# Patient Record
Sex: Male | Born: 1960
Health system: Southern US, Community
[De-identification: ages and names within clinical notes are randomized; demographics above are authoritative.]

## PROBLEM LIST (undated history)

## (undated) ENCOUNTER — Emergency Department (HOSPITAL_COMMUNITY): Admission: EM | Payer: Self-pay

## (undated) DIAGNOSIS — F1910 Other psychoactive substance abuse, uncomplicated: Secondary | ICD-10-CM

## (undated) DIAGNOSIS — M1990 Unspecified osteoarthritis, unspecified site: Secondary | ICD-10-CM

## (undated) DIAGNOSIS — I10 Essential (primary) hypertension: Secondary | ICD-10-CM

## (undated) DIAGNOSIS — E119 Type 2 diabetes mellitus without complications: Secondary | ICD-10-CM

## (undated) DIAGNOSIS — Z9289 Personal history of other medical treatment: Secondary | ICD-10-CM

## (undated) DIAGNOSIS — N289 Disorder of kidney and ureter, unspecified: Secondary | ICD-10-CM

## (undated) DIAGNOSIS — S86019A Strain of unspecified Achilles tendon, initial encounter: Secondary | ICD-10-CM

## (undated) DIAGNOSIS — K219 Gastro-esophageal reflux disease without esophagitis: Secondary | ICD-10-CM

## (undated) DIAGNOSIS — N183 Chronic kidney disease, stage 3 unspecified: Secondary | ICD-10-CM

## (undated) HISTORY — DX: Essential (primary) hypertension: I10

## (undated) HISTORY — PX: ORBITAL FRACTURE SURGERY: SHX725

## (undated) HISTORY — DX: Other psychoactive substance abuse, uncomplicated: F19.10

## (undated) SURGERY — Surgical Case
Anesthesia: *Unknown

---

## 1999-08-07 ENCOUNTER — Encounter: Payer: Self-pay | Admitting: Emergency Medicine

## 1999-08-07 ENCOUNTER — Emergency Department (HOSPITAL_COMMUNITY): Admission: EM | Admit: 1999-08-07 | Discharge: 1999-08-08 | Payer: Self-pay | Admitting: Emergency Medicine

## 1999-11-28 ENCOUNTER — Emergency Department (HOSPITAL_COMMUNITY): Admission: EM | Admit: 1999-11-28 | Discharge: 1999-11-28 | Payer: Self-pay

## 2000-04-25 ENCOUNTER — Encounter: Payer: Self-pay | Admitting: Emergency Medicine

## 2000-04-25 ENCOUNTER — Emergency Department (HOSPITAL_COMMUNITY): Admission: EM | Admit: 2000-04-25 | Discharge: 2000-04-25 | Payer: Self-pay | Admitting: Emergency Medicine

## 2000-12-11 ENCOUNTER — Emergency Department (HOSPITAL_COMMUNITY): Admission: EM | Admit: 2000-12-11 | Discharge: 2000-12-12 | Payer: Self-pay | Admitting: Emergency Medicine

## 2000-12-12 ENCOUNTER — Encounter: Payer: Self-pay | Admitting: Emergency Medicine

## 2000-12-13 ENCOUNTER — Inpatient Hospital Stay (HOSPITAL_COMMUNITY): Admission: EM | Admit: 2000-12-13 | Discharge: 2000-12-14 | Payer: Self-pay | Admitting: Emergency Medicine

## 2000-12-13 ENCOUNTER — Encounter: Payer: Self-pay | Admitting: Otolaryngology

## 2000-12-14 ENCOUNTER — Inpatient Hospital Stay (HOSPITAL_COMMUNITY): Admission: AD | Admit: 2000-12-14 | Discharge: 2000-12-15 | Payer: Self-pay | Admitting: Oral Surgery

## 2001-09-13 ENCOUNTER — Emergency Department (HOSPITAL_COMMUNITY): Admission: EM | Admit: 2001-09-13 | Discharge: 2001-09-13 | Payer: Self-pay | Admitting: *Deleted

## 2001-12-20 ENCOUNTER — Emergency Department (HOSPITAL_COMMUNITY): Admission: EM | Admit: 2001-12-20 | Discharge: 2001-12-21 | Payer: Self-pay | Admitting: Emergency Medicine

## 2002-04-23 ENCOUNTER — Emergency Department (HOSPITAL_COMMUNITY): Admission: EM | Admit: 2002-04-23 | Discharge: 2002-04-23 | Payer: Self-pay

## 2003-03-06 ENCOUNTER — Emergency Department (HOSPITAL_COMMUNITY): Admission: EM | Admit: 2003-03-06 | Discharge: 2003-03-06 | Payer: Self-pay | Admitting: Emergency Medicine

## 2003-03-06 ENCOUNTER — Encounter: Payer: Self-pay | Admitting: Emergency Medicine

## 2003-08-24 ENCOUNTER — Emergency Department (HOSPITAL_COMMUNITY): Admission: EM | Admit: 2003-08-24 | Discharge: 2003-08-24 | Payer: Self-pay | Admitting: Emergency Medicine

## 2004-09-11 ENCOUNTER — Emergency Department (HOSPITAL_COMMUNITY): Admission: EM | Admit: 2004-09-11 | Discharge: 2004-09-11 | Payer: Self-pay | Admitting: Emergency Medicine

## 2005-09-26 ENCOUNTER — Emergency Department (HOSPITAL_COMMUNITY): Admission: EM | Admit: 2005-09-26 | Discharge: 2005-09-26 | Payer: Self-pay | Admitting: Emergency Medicine

## 2005-10-06 ENCOUNTER — Emergency Department (HOSPITAL_COMMUNITY): Admission: EM | Admit: 2005-10-06 | Discharge: 2005-10-07 | Payer: Self-pay | Admitting: Emergency Medicine

## 2005-10-15 ENCOUNTER — Ambulatory Visit: Payer: Self-pay | Admitting: Nurse Practitioner

## 2006-02-01 ENCOUNTER — Emergency Department (HOSPITAL_COMMUNITY): Admission: EM | Admit: 2006-02-01 | Discharge: 2006-02-01 | Payer: Self-pay | Admitting: Emergency Medicine

## 2008-02-08 ENCOUNTER — Inpatient Hospital Stay (HOSPITAL_COMMUNITY): Admission: EM | Admit: 2008-02-08 | Discharge: 2008-02-14 | Payer: Self-pay | Admitting: Emergency Medicine

## 2008-06-28 ENCOUNTER — Emergency Department (HOSPITAL_COMMUNITY): Admission: EM | Admit: 2008-06-28 | Discharge: 2008-06-28 | Payer: Self-pay | Admitting: Emergency Medicine

## 2008-07-01 ENCOUNTER — Ambulatory Visit: Payer: Self-pay | Admitting: *Deleted

## 2009-03-11 ENCOUNTER — Emergency Department (HOSPITAL_COMMUNITY): Admission: EM | Admit: 2009-03-11 | Discharge: 2009-03-12 | Payer: Self-pay | Admitting: Emergency Medicine

## 2009-03-24 ENCOUNTER — Ambulatory Visit (HOSPITAL_COMMUNITY): Admission: RE | Admit: 2009-03-24 | Discharge: 2009-03-25 | Payer: Self-pay | Admitting: Otolaryngology

## 2009-10-18 ENCOUNTER — Emergency Department (HOSPITAL_COMMUNITY): Admission: EM | Admit: 2009-10-18 | Discharge: 2009-10-18 | Payer: Self-pay | Admitting: Emergency Medicine

## 2011-03-24 LAB — BASIC METABOLIC PANEL
BUN: 14 mg/dL (ref 6–23)
CO2: 24 mEq/L (ref 19–32)
Calcium: 8.4 mg/dL (ref 8.4–10.5)
Chloride: 106 mEq/L (ref 96–112)
Creatinine, Ser: 1.61 mg/dL — ABNORMAL HIGH (ref 0.4–1.5)
GFR calc Af Amer: 56 mL/min — ABNORMAL LOW (ref >60)
GFR calc non Af Amer: 47 mL/min — ABNORMAL LOW (ref >60)
Glucose, Bld: 84 mg/dL (ref 70–99)
Potassium: 3.4 mEq/L — ABNORMAL LOW (ref 3.5–5.1)
Sodium: 137 mEq/L (ref 135–145)

## 2011-03-24 LAB — CBC
MCHC: 34 g/dL (ref 30.0–36.0)
Platelets: 192 10*3/uL (ref 150–400)
RDW: 14.2 % (ref 11.5–15.5)

## 2011-03-24 LAB — GLUCOSE, CAPILLARY
Glucose-Capillary: 152 mg/dL — ABNORMAL HIGH (ref 70–99)
Glucose-Capillary: 217 mg/dL — ABNORMAL HIGH (ref 70–99)
Glucose-Capillary: 81 mg/dL (ref 70–99)

## 2011-03-25 LAB — ETHANOL: Alcohol, Ethyl (B): <5 mg/dL (ref 0–10)

## 2011-03-25 LAB — RAPID URINE DRUG SCREEN, HOSP PERFORMED
Amphetamines: NOT DETECTED
Tetrahydrocannabinol: NOT DETECTED

## 2011-04-15 ENCOUNTER — Emergency Department (HOSPITAL_COMMUNITY)
Admission: EM | Admit: 2011-04-15 | Discharge: 2011-04-15 | Disposition: A | Discharge status: Home / Self Care | Payer: Self-pay | Attending: Emergency Medicine | Admitting: Emergency Medicine

## 2011-04-15 ENCOUNTER — Emergency Department (HOSPITAL_COMMUNITY): Payer: Self-pay

## 2011-04-15 DIAGNOSIS — K2970 Gastritis, unspecified, without bleeding: Secondary | ICD-10-CM | POA: Insufficient documentation

## 2011-04-15 DIAGNOSIS — E119 Type 2 diabetes mellitus without complications: Secondary | ICD-10-CM | POA: Insufficient documentation

## 2011-04-15 DIAGNOSIS — R112 Nausea with vomiting, unspecified: Secondary | ICD-10-CM | POA: Insufficient documentation

## 2011-04-15 DIAGNOSIS — I10 Essential (primary) hypertension: Secondary | ICD-10-CM | POA: Insufficient documentation

## 2011-04-15 DIAGNOSIS — R079 Chest pain, unspecified: Secondary | ICD-10-CM | POA: Insufficient documentation

## 2011-04-15 DIAGNOSIS — S60569A Insect bite (nonvenomous) of unspecified hand, initial encounter: Secondary | ICD-10-CM | POA: Insufficient documentation

## 2011-04-15 LAB — DIFFERENTIAL
Basophils Absolute: 0 10*3/uL (ref 0.0–0.1)
Basophils Relative: 0 % (ref 0–1)
Eosinophils Relative: 2 % (ref 0–5)
Lymphocytes Relative: 24 % (ref 12–46)
Monocytes Absolute: 1.2 10*3/uL — ABNORMAL HIGH (ref 0.1–1.0)
Neutro Abs: 6.2 10*3/uL (ref 1.7–7.7)

## 2011-04-15 LAB — CBC
HCT: 39.6 % (ref 39.0–52.0)
Hemoglobin: 14 g/dL (ref 13.0–17.0)
MCHC: 35.4 g/dL (ref 30.0–36.0)
RDW: 14.3 % (ref 11.5–15.5)
WBC: 10 10*3/uL (ref 4.0–10.5)

## 2011-04-15 LAB — POCT CARDIAC MARKERS
CKMB, poc: 17.4 ng/mL (ref 1.0–8.0)
Myoglobin, poc: 267 ng/mL (ref 12–200)

## 2011-04-15 LAB — BASIC METABOLIC PANEL
CO2: 25 mEq/L (ref 19–32)
Calcium: 9.6 mg/dL (ref 8.4–10.5)
Creatinine, Ser: 1.46 mg/dL (ref 0.4–1.5)
GFR calc non Af Amer: 52 mL/min — ABNORMAL LOW (ref >60)
Glucose, Bld: 146 mg/dL — ABNORMAL HIGH (ref 70–99)
Sodium: 137 mEq/L (ref 135–145)

## 2011-04-27 NOTE — H&P (Signed)
Ryan [name], Ryan [name] NO.:  [id number]   MEDICAL RECORD NO.:  [id number]          PATIENT TYPE:  EMS   LOCATION:  MAJO                         FACILITY:  [facility]   PHYSICIAN:  Altha Harm, MDDATE OF BIRTH:  05-Sep-1961   DATE OF ADMISSION:  02/08/2008  DATE OF DISCHARGE:                              HISTORY & PHYSICAL   CHIEF COMPLAINT:  Abdominal pain.   HISTORY OF PRESENT ILLNESS:  This is a 50 year old gentleman who  presents to the emergency room with complaints of abdominal pain.  The  patient is having a difficult time describing the pain.  He states that  it is constant and has varying intensity.  The pain at its highest is a  9 out of 10 and goes down to as low as a 7 out of 10.  The pain appears  in the suprapubic area and radiates around to the back.  There is no  palliative or provocative features.   The patient has had no fever or chills.  He has had no nausea, vomiting  or diarrhea.  In addition to this, the patient also states that 2 days  ago he noticed that he had a testicular mass.  He states that the mass  is painful to touch only when he squeezes on it.  Please note that the  patient has a history of diabetes type 2 and hypertension and takes  medications which he ran out of 2 days ago, however, the patient is not  followed by any clinician for monitoring of these chronic illnesses.   PAST MEDICAL HISTORY:  Significant for:  1. Hypertension  2. Diabetes type 2.   The patient denies any surgical history.   FAMILY HISTORY:  Significant for hypertension in his mother and his  sister.   SOCIAL HISTORY:  The patient has no tobacco, alcohol or drug use and  works as a Administrator.   Currently, he has not taken any of his medications since 3 days ago,  however, he thinks that he takes Glucophage 500 mg daily and Norvasc 10  mg daily.   He does not have a primary care physician.  He picks up his medications  from [facility]  of [facility] [employer].   ALLERGIES:  He has no known drug allergies.   REVIEW OF SYSTEMS:  The patient denies any dysuria.  He denies any  penile discharge.  He denies any difficulty with sexual activity.  He  denies any dark urine.   Otherwise, all systems are negative, except as noted in the history of  present illness.   Studies done in the emergency room:  A urinalysis shows positive  nitrates, trace leukocytes and hyaline casts.  There are no significant  WBC's.  The patient does have mucus and a hyaline cast present.  He also  has ketones present in the urine.   Hemogram shows a white blood cell count of 7.7, hemoglobin of 16.3,  hematocrit of 48.8 and a platelet count of 172,000, sodium is 133,  potassium 4.0, chloride 102, bicarb 23, BUN 20, creatinine 2.02 and  glucose of 115.  CT of the abdomen without contrast shows a suspicion of a small bowel  obstruction.   No masses, diverticulosis without diverticulitis.  No stones in the  urinary system.   PHYSICAL EXAMINATION:  VITAL SIGNS:  Blood pressure 176/100.  Heart rate  86.  Respiratory rate 16. SATs 97% on room air.  Temperature 97.3.  GENERALLY:  The patient is laying in bed, in no acute distress.  He  appears nontoxic in appearance.  HEENT:  He is normocephalic, atraumatic.  Pupils are equal, round and  reactive to light and accommodation. Extraocular movements are intact.  Oropharynx is moist.  The patient has poor mouth hygiene.  There is no  exudate, erythema or lesions noted.  There is some dryness to the mouth.  NECK:  Trachea is midline.  No masses.  No thyromegaly.  No JVD.  No  carotid bruits.  RESPIRATORY:  The patient has normal respiratory effort.  Equal  excursion bilaterally.  No wheezes or rhonchi noted on examination.  CARDIOVASCULAR:  He has got a normal S1 and S2.  No murmurs, rubs or  gallops are noted.  PMI is nonplaced.  No heaves or thrills or  palpation.  ABDOMEN:  Obese, soft, nontender,  nondistended.  No masses.  No  hepatosplenomegaly.  The patient has normoactive bowel sounds.  Please  note, the patient has no real tenderness, however, he does get the  distribution of pain, which is in varying intensity in the area of the  suprapubic area radiating to the back.  LYMPHATICS:  He has got no cervical, axillary or inguinal  lymphadenopathy noted.  GENITALS:  The patient does have well defined round mass in the right  scrotum, which is tender to palpation.  There is no masses in the  inguinal canal noted.  MUSCULOSKELETAL:  He has got no warmth, swelling or erythema around the  joint.  There is no spinal tenderness noted.  NEUROLOGICAL:  Cranial nerves II through XII are grossly intact.  Gait  is normal.  DTR's are 2+ bilaterally upper and lower extremities.  Sensation intact to light touch, and proprioception.  PSYCHIATRIC:  He is alert and oriented x3.  The patient appears to have  poor insight into his disease processes.  His cognition appears intact  and his recent recall of his medication is impaired.   I am unclear as to whether or not this is actually recall of the  patient's level of knowledge of his disease process.   ASSESSMENT AND PLAN:  This patient presents with:  1. Hypertensive urgency.  2. Abdominal pain.  Questionable small bowel obstruction.  3. Renal insufficiency.  4. Testicular mass.   At this time, I will go ahead and try the patient on clear liquids, as  he has no nausea or vomiting.  Advance as tolerated to a heart healthy  diet, carb modified.  The patient has CT without contrast performed  secondary to his poor renal function.  We need to probably repeat the CT  with contrast if his pain persists once his renal function is improved.  In terms of his renal insufficiency, I will go ahead and get a renal  ultrasound on the patient.  In terms of a testicular mass, the patient  will get a testicular ultrasound to further evaluate it.  The patient  is  being started on lisinopril, even though he has some renal  insufficiency.  The ACE inhibitor is still useful in renal protection.  I also have him  on hydrochlorothiazide on a p.r.n. basis.  Patient will  be placed on a sliding scale insulin and blood sugars  checked.  Glucophage is contraindicated secondary to his renal function  at this time.  The patient also needs diabetic education, a nutrition  consult and he also needs the information for health care clinic so that  he can make an appointment to follow up when he is discharged.      Altha Harm, MD  Electronically Signed     MAM/MEDQ  D:  02/08/2008  T:  02/09/2008  Job:  [id number]
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
## 2011-04-27 NOTE — Consult Note (Signed)
NAMEGLENDELL, [name]             ACCOUNT NO.:  [id number]   MEDICAL RECORD NO.:  [id number]          PATIENT TYPE:  INP   LOCATION:  5121                         FACILITY:  [facility]   PHYSICIAN:  Cherylynn Ridges, M.D.    DATE OF BIRTH:  [birth year]   DATE OF CONSULTATION:  02/11/2008  DATE OF DISCHARGE:                                 CONSULTATION   REFERRING PHYSICIAN:  Wilson Singer, M.D.   Dear Dr. Karilyn Cota:  Thank you very much for asking me to see Ryan [name], a 50 year old  gentleman admitted 3 days ago with abdominal pain of unknown etiology  and a CT scan demonstrating what is thought to be a partial small-bowel  obstruction.  Incidentally on abdominal exam he was found to have a  right scrotal mass, and workup is in progress for that.  He has had no  previous abdominal surgery and his only past medical history is  significant for hypertension and non-insulin-dependent diabetes  mellitus.  He is an otherwise healthy gentleman.   On examination today, he is afebrile.  Vital signs are stable.  ABDOMEN:  Distended but he just had an NG tube put in place, which drew  out approximately 600 mL of light brown bowel contents.  The patient  states that the diarrhea that he had today was exactly the same as what  came of his NG tube.  There was no blood and minimal bile.  His abdomen  was softer but still distended, had no tenderness, no palpable masses.  LUNGS:  Clear to auscultation.  CARDIAC:  Regular rhythm and rate with no murmurs, no lifts, no gallops,  no heaves.   IMPRESSION:  He might have a gastroenteritis of high-grade nature or a  possible internal hernia; however, this seems to be unlikely.  He has no  external sources of hernias and no obvious source of a bowel  obstruction.  He is not very tender, does not appear to be acidotic.  He  is making good urine and his NG tube is putting out a fair amount, and  this may be a manifestation of his bowel enteritis.  I would  plan on  getting a repeat abdominal film tomorrow morning along with CBC with  differential and to follow the patient conservatively.  He will be  followed by the general surgery service.  I do not think he requires  surgical intervention at this time; however, if he should, then a  general exploratory laparotomy would be needed.  I do believe this will  likely resolve on its own.  However, if it does not shortly then he will  need an exploratory laparotomy.  An NG tube was only placed today.      Cherylynn Ridges, M.D.  Electronically Signed     JOW/MEDQ  D:  02/11/2008  T:  02/12/2008  Job:  [id number]

## 2011-04-30 NOTE — Discharge Summary (Signed)
Ryan [name], Ryan [name]             ACCOUNT NO.:  [id number]   MEDICAL RECORD NO.:  [id number]          PATIENT TYPE:  INP   LOCATION:  5121                         FACILITY:  [facility]   PHYSICIAN:  Hettie Holstein, D.O.    DATE OF BIRTH:  1961/03/14   DATE OF ADMISSION:  02/08/2008  DATE OF DISCHARGE:  02/14/2008                               DISCHARGE SUMMARY   PRIMARY CARE PHYSICIAN:  [facility].   FINAL DIAGNOSES:  Partial small bowel obstruction resolved, status post  inpatient consultation with [facility].   SECONDARY DIAGNOSES:  1. Scrotal mass, status post evaluation via ultrasound of the scrotum      that revealed normal sonographic appearance of the testes.  No      evidence for intratesticular mass or lesion.  There are multiple      cysts in the right hemiscrotum with simple features.  The large of      these cysts measures 2 cm in maximum diameter.  These were felt to      be likely epididymal cysts or spermatoceles and some bilateral      hydroceles.  2. Diabetes mellitus.  Hemoglobin A1c of 6.1.  3. Hypertension.   DISCHARGE MEDICATIONS:  He was discharged on:  1. Lisinopril 20 mg daily.  2. Glucophage 850 mg daily.  3. Hydrochlorothiazide 25 mg daily.  4. Labetalol 100 mg twice daily.   FOLLOWUP:  He is instructed to follow up with the [facility].   HISTORY OF PRESENT ILLNESS:  For full details, please refer to the H&P  as dictated by Dr. Marthann Schiller.  However briefly, Mr. Ryan [name] is a  50 year old male who presented to the emergency department with  complaints of abdominal pain.  He stated that it was 9/10 scale and as  well as 7/10 scale.  He reported this to be in the suprapubic area  radiating around to his back.  There was no palliative or provocative  features.  He had no fevers or chills.  He also reported discovery of  the testicular mass.   HOSPITAL COURSE:  The patient was felt to have possible small bowel  obstruction and  hypertensive urgency.  He was admitted, underwent  general surgery consultation that was provided by Dr. Jimmye Norman, where  he underwent NG tube placement, and subsequently his obstruction  resolved, and he was felt to be stable for discharge home.  He  underwent further evaluation of the scrotal mass as described above and  the findings were as described above.  He was felt to be suitable for  discharge home and to followup with [facility].  He was instructed to  call for an appointment.      Hettie Holstein, D.O.  Electronically Signed     ESS/MEDQ  D:  03/09/2008  T:  03/10/2008  Job:  [id number]

## 2011-05-11 ENCOUNTER — Emergency Department (HOSPITAL_COMMUNITY): Payer: Self-pay

## 2011-05-11 ENCOUNTER — Emergency Department (HOSPITAL_COMMUNITY)
Admission: EM | Admit: 2011-05-11 | Discharge: 2011-05-11 | Disposition: A | Discharge status: Home / Self Care | Payer: Self-pay | Attending: Emergency Medicine | Admitting: Emergency Medicine

## 2011-05-11 DIAGNOSIS — I10 Essential (primary) hypertension: Secondary | ICD-10-CM | POA: Insufficient documentation

## 2011-05-11 DIAGNOSIS — R0989 Other specified symptoms and signs involving the circulatory and respiratory systems: Secondary | ICD-10-CM | POA: Insufficient documentation

## 2011-05-11 DIAGNOSIS — R110 Nausea: Secondary | ICD-10-CM | POA: Insufficient documentation

## 2011-05-11 DIAGNOSIS — E119 Type 2 diabetes mellitus without complications: Secondary | ICD-10-CM | POA: Insufficient documentation

## 2011-05-11 DIAGNOSIS — R1013 Epigastric pain: Secondary | ICD-10-CM | POA: Insufficient documentation

## 2011-05-11 DIAGNOSIS — R350 Frequency of micturition: Secondary | ICD-10-CM | POA: Insufficient documentation

## 2011-05-11 DIAGNOSIS — R0602 Shortness of breath: Secondary | ICD-10-CM | POA: Insufficient documentation

## 2011-05-11 DIAGNOSIS — R0609 Other forms of dyspnea: Secondary | ICD-10-CM | POA: Insufficient documentation

## 2011-05-11 DIAGNOSIS — R079 Chest pain, unspecified: Secondary | ICD-10-CM | POA: Insufficient documentation

## 2011-05-11 LAB — DIFFERENTIAL
Basophils Absolute: 0 10*3/uL (ref 0.0–0.1)
Eosinophils Relative: 1 % (ref 0–5)
Lymphocytes Relative: 26 % (ref 12–46)
Lymphs Abs: 2.2 10*3/uL (ref 0.7–4.0)
Monocytes Absolute: 0.6 10*3/uL (ref 0.1–1.0)
Monocytes Relative: 7 % (ref 3–12)
Neutro Abs: 5.3 10*3/uL (ref 1.7–7.7)

## 2011-05-11 LAB — URINALYSIS, ROUTINE W REFLEX MICROSCOPIC
Bilirubin Urine: NEGATIVE
Glucose, UA: NEGATIVE mg/dL
Hgb urine dipstick: NEGATIVE
Protein, ur: NEGATIVE mg/dL

## 2011-05-11 LAB — COMPREHENSIVE METABOLIC PANEL
ALT: 21 U/L (ref 0–53)
AST: 22 U/L (ref 0–37)
Albumin: 4.1 g/dL (ref 3.5–5.2)
Calcium: 9.3 mg/dL (ref 8.4–10.5)
Chloride: 106 mEq/L (ref 96–112)
Creatinine, Ser: 1.61 mg/dL — ABNORMAL HIGH (ref 0.4–1.5)
GFR calc Af Amer: 56 mL/min — ABNORMAL LOW (ref >60)
Sodium: 142 mEq/L (ref 135–145)
Total Bilirubin: 0.3 mg/dL (ref 0.3–1.2)

## 2011-05-11 LAB — CBC
HCT: 43.6 % (ref 39.0–52.0)
Hemoglobin: 14.7 g/dL (ref 13.0–17.0)
MCH: 30.6 pg (ref 26.0–34.0)
MCHC: 33.7 g/dL (ref 30.0–36.0)
MCV: 90.6 fL (ref 78.0–100.0)
RDW: 14.9 % (ref 11.5–15.5)

## 2011-05-11 LAB — CK TOTAL AND CKMB (NOT AT ARMC): Total CK: 311 U/L — ABNORMAL HIGH (ref 7–232)

## 2011-05-11 LAB — TROPONIN I: Troponin I: <0.3 ng/mL (ref <0.30)

## 2011-05-12 ENCOUNTER — Inpatient Hospital Stay (HOSPITAL_COMMUNITY)
Admission: EM | Admit: 2011-05-12 | Discharge: 2011-05-14 | DRG: 918 | Disposition: A | Discharge status: Home / Self Care | Payer: Self-pay | Attending: Family Medicine | Admitting: Family Medicine

## 2011-05-12 DIAGNOSIS — R0789 Other chest pain: Secondary | ICD-10-CM

## 2011-05-12 DIAGNOSIS — I201 Angina pectoris with documented spasm: Secondary | ICD-10-CM | POA: Diagnosis present

## 2011-05-12 DIAGNOSIS — R51 Headache: Secondary | ICD-10-CM | POA: Diagnosis not present

## 2011-05-12 DIAGNOSIS — I10 Essential (primary) hypertension: Secondary | ICD-10-CM

## 2011-05-12 DIAGNOSIS — Y92009 Unspecified place in unspecified non-institutional (private) residence as the place of occurrence of the external cause: Secondary | ICD-10-CM

## 2011-05-12 DIAGNOSIS — T405X1A Poisoning by cocaine, accidental (unintentional), initial encounter: Principal | ICD-10-CM | POA: Diagnosis present

## 2011-05-12 DIAGNOSIS — F1410 Cocaine abuse, uncomplicated: Secondary | ICD-10-CM | POA: Diagnosis present

## 2011-05-12 DIAGNOSIS — N189 Chronic kidney disease, unspecified: Secondary | ICD-10-CM

## 2011-05-12 DIAGNOSIS — F1610 Hallucinogen abuse, uncomplicated: Secondary | ICD-10-CM

## 2011-05-12 DIAGNOSIS — E119 Type 2 diabetes mellitus without complications: Secondary | ICD-10-CM | POA: Diagnosis present

## 2011-05-12 LAB — RAPID URINE DRUG SCREEN, HOSP PERFORMED
Amphetamines: NOT DETECTED
Barbiturates: NOT DETECTED
Benzodiazepines: NOT DETECTED
Tetrahydrocannabinol: NOT DETECTED

## 2011-05-12 LAB — PROTIME-INR
INR: 1.06 (ref 0.00–1.49)
Prothrombin Time: 14 seconds (ref 11.6–15.2)

## 2011-05-12 LAB — POCT I-STAT, CHEM 8
Calcium, Ion: 1.12 mmol/L (ref 1.12–1.32)
HCT: 44 % (ref 39.0–52.0)
Hemoglobin: 15 g/dL (ref 13.0–17.0)
Sodium: 141 mEq/L (ref 135–145)
TCO2: 21 mmol/L (ref 0–100)

## 2011-05-12 LAB — CBC
HCT: 39 % (ref 39.0–52.0)
Platelets: 177 10*3/uL (ref 150–400)
RBC: 4.55 MIL/uL (ref 4.22–5.81)
RBC: 4.33 MIL/uL (ref 4.22–5.81)
RDW: 14.8 % (ref 11.5–15.5)
RDW: 14.8 % (ref 11.5–15.5)
WBC: 5.2 10*3/uL (ref 4.0–10.5)
WBC: 6 10*3/uL (ref 4.0–10.5)

## 2011-05-12 LAB — CK TOTAL AND CKMB (NOT AT ARMC)
CK, MB: 4.6 ng/mL — ABNORMAL HIGH (ref 0.3–4.0)
Total CK: 308 U/L — ABNORMAL HIGH (ref 7–232)
Total CK: 259 U/L — ABNORMAL HIGH (ref 7–232)

## 2011-05-12 LAB — DIFFERENTIAL
Lymphocytes Relative: 43 % (ref 12–46)
Monocytes Absolute: 0.6 10*3/uL (ref 0.1–1.0)
Monocytes Relative: 11 % (ref 3–12)
Neutro Abs: 2.2 10*3/uL (ref 1.7–7.7)

## 2011-05-12 LAB — APTT: aPTT: 24 seconds (ref 24–37)

## 2011-05-12 LAB — TROPONIN I: Troponin I: <0.3 ng/mL (ref <0.30)

## 2011-05-12 LAB — CARDIAC PANEL(CRET KIN+CKTOT+MB+TROPI)
Total CK: 269 U/L — ABNORMAL HIGH (ref 7–232)
Troponin I: <0.3 ng/mL (ref <0.30)

## 2011-05-13 HISTORY — PX: CARDIAC CATHETERIZATION: SHX172

## 2011-05-13 LAB — TSH: TSH: 1.389 u[IU]/mL (ref 0.350–4.500)

## 2011-05-13 LAB — LIPID PANEL
Cholesterol: 145 mg/dL (ref 0–200)
Triglycerides: 65 mg/dL (ref <150)

## 2011-05-13 LAB — CBC
MCH: 30.5 pg (ref 26.0–34.0)
MCV: 90.5 fL (ref 78.0–100.0)
Platelets: 188 10*3/uL (ref 150–400)
RDW: 14.8 % (ref 11.5–15.5)
WBC: 7.4 10*3/uL (ref 4.0–10.5)

## 2011-05-13 LAB — GLUCOSE, CAPILLARY
Glucose-Capillary: 107 mg/dL — ABNORMAL HIGH (ref 70–99)
Glucose-Capillary: 133 mg/dL — ABNORMAL HIGH (ref 70–99)

## 2011-05-13 LAB — CARDIAC PANEL(CRET KIN+CKTOT+MB+TROPI)
CK, MB: 3 ng/mL (ref 0.3–4.0)
Relative Index: 1.9 (ref 0.0–2.5)

## 2011-05-13 LAB — HEMOGLOBIN A1C
Hgb A1c MFr Bld: 6.7 % — ABNORMAL HIGH (ref <5.7)
Mean Plasma Glucose: 146 mg/dL — ABNORMAL HIGH (ref <117)

## 2011-05-13 LAB — BASIC METABOLIC PANEL
BUN: 17 mg/dL (ref 6–23)
Chloride: 104 mEq/L (ref 96–112)
Glucose, Bld: 129 mg/dL — ABNORMAL HIGH (ref 70–99)
Potassium: 4.1 mEq/L (ref 3.5–5.1)

## 2011-05-13 LAB — POCT ACTIVATED CLOTTING TIME: Activated Clotting Time: 93 seconds

## 2011-05-14 LAB — GLUCOSE, CAPILLARY
Glucose-Capillary: 170 mg/dL — ABNORMAL HIGH (ref 70–99)
Glucose-Capillary: 110 mg/dL — ABNORMAL HIGH (ref 70–99)

## 2011-05-14 LAB — CBC
MCH: 31.1 pg (ref 26.0–34.0)
MCV: 90.8 fL (ref 78.0–100.0)
Platelets: 162 10*3/uL (ref 150–400)
RDW: 14.2 % (ref 11.5–15.5)

## 2011-05-14 LAB — BASIC METABOLIC PANEL
BUN: 12 mg/dL (ref 6–23)
Creatinine, Ser: 1.3 mg/dL (ref 0.4–1.5)
GFR calc non Af Amer: 59 mL/min — ABNORMAL LOW (ref >60)

## 2011-05-19 NOTE — H&P (Signed)
Ryan [name], Ryan [name]            ACCOUNT NO.:  [id number]  MEDICAL RECORD NO.:  [id number]           PATIENT TYPE:  I  LOCATION:  2901                         FACILITY:  [facility]  PHYSICIAN:  Leighton Roach Elliyah Liszewski, M.D.DATE OF BIRTH:  1961/05/16  DATE OF ADMISSION:  05/12/2011 DATE OF DISCHARGE:                             HISTORY & PHYSICAL   PRIMARY CARE PHYSICIAN:  Unassigned.  CHIEF COMPLAINT:  Chest pain.  HISTORY OF PRESENT ILLNESS:  The patient is a 50 year old male with history of hypertension, cocaine abuse, and questionable diabetes who presents with chest pain.  He has had increasing episodes of chest pain since Apr 15, 2011.  Worst episode was last night, he went to the ED at that time but left AMA prior to admission.  Now comes back today still with chest pain.  Describes pain as being along the sternum and feels like pressure but occasionally sharp.  Does have shortness of breath when he has a pain and pain symptoms resolved with rest.  He denies radiation of pain or worsening with exertion.  This chest pain has woken him from sleep before.  He did have nausea and vomiting with his chest pain history.  He does admit to cocaine use, last time was 4 days ago. He is currently on no meds for his hypertension.  He denies recent illness, headache, abdominal pain, edema, or diarrhea.  PAST MEDICAL HISTORY: 1. Hypertension. 2. Cocaine abuse. 3. Questionable diabetes.  PAST SURGICAL HISTORY:  Orbital fracture repair.  SOCIAL HISTORY:  Currently lives with his mother, brother, and sister in [place].  He is employed by [employer].  He denies tobacco use. He does endorse occasional alcohol use.  He does endorse cocaine use, last time use was 4 days ago.  FAMILY HISTORY:  Noncontributory.  REVIEW OF SYSTEMS:  Endorses sweats, dyspnea, nausea, and vomiting. Denies fever, chills, headache, edema, PND, palpitations, diarrhea, dysphagia, or rash.  ALLERGIES:  No known  drug allergies.  MEDICATIONS:  None.  PHYSICAL EXAMINATION:  VITAL SIGNS:  Temperature 98.7, pulse 75-83, respiration 16-18, blood pressure 155-173 over 99-102, pulse ox 98% on room air. GENERAL:  In no acute distress, alert, and oriented x3. HEENT:  Normocephalic and atraumatic.  Pupils equal, round, and reactive to light.  Extraocular movements intact.  Mucous membranes are moist. NECK:  Trachea is midline.  Neck is supple without adenopathy. CARDIOVASCULAR:  Regular rate and rhythm.  No murmur, rub, or gallop. LUNGS:  Clear to auscultation bilaterally. ABDOMEN:  Soft, nontender, nondistended.  Bowel sounds positive. BACK:  Without CVA tenderness. EXTREMITIES:  Without edema.  He does have multiple scars on the anterior lower leg and forearms. NEUROLOGIC:  No focal deficits.  LABORATORY DATA AND STUDIES:  WBCs of 5.2, hemoglobin 14.3, hematocrit 40.9, platelets of 177.  Sodium 141, potassium 4.1, chloride 110, bicarb 21, BUN of 15, creatinine 1.80, glucose 111.  Chest x-ray from May 11, 2011, shows no active cardiopulmonary disease.  Cardiac enzymes obtained in the ED, troponin less than 0.30, CK-MB of 4.6, CK of 308, INR of 1.06.  EKG with nonspecific ST-T wave abnormalities diffusely.  ASSESSMENT AND PLAN:  This is a 51 year old male with history of hypertension, cocaine abuse, and question of diabetes here with chest pain. 1. Chest pain.  History and EKG concerning for unstable angina or     acute coronary syndrome especially in the setting of cocaine use.     We will admit to step-down unit for monitoring.  We will go ahead     and place on nitroglycerin and heparin drip.  We will give loading     dose of Plavix and continue this daily.  We will also risk stratify     with hemoglobin A1c, fasting lipid panel, and TSH.  Morphine for     pain control as needed.  Other possibilities for chest pain include     musculoskeletal pain or gastroesophageal reflux disease.  We will      give Protonix to see if this helps as well in case this is due to     reflux.  Dr. Sharyn Lull with Cardiology has been consulted and has     agreed to see this patient. 2. Hypertension.  Blood pressure was elevated in ED, unsure what meds     he has taken in the past for his hypertension.  We will use nitro     drip for now.  Consider starting hydrochlorothiazide or angiotensin-     converting enzyme inhibitor if diabetic and creatinine improves. 3. Question diabetes.  We will get hemoglobin A1c to assess level of     glycemic control.  Glucose on BMET looks within normal range at     this time. 4. Cocaine abuse.  Last reported use was 4 days ago; however, we will     get UDS to look for other drugs of abuse.  We will use Ativan for     chest pain control. 5. Fluids, electrolytes, and nutrition/gastrointestinal.  Low-sodium,     heart healthy diet with normal saline 125 mL/hour. 6. Prophylaxis.  Heparin drip plus Protonix. 7. Disposition.  Pending further workup and improvement.    ______________________________ Everrett Coombe, MD   ______________________________ Leighton Roach Natanya Holecek, M.D.    CM/MEDQ  D:  05/12/2011  T:  05/13/2011  Job:  [id number]  Electronically Signed by Everrett Coombe MD on 05/13/2011 11:08:42 PM Electronically Signed by Acquanetta Belling M.D. on 05/19/2011 01:23:18 PM

## 2011-05-19 NOTE — Discharge Summary (Signed)
[name], Ryan [name]            ACCOUNT NO.:  [id number]  MEDICAL RECORD NO.:  [id number]           PATIENT TYPE:  I  LOCATION:  6527                         FACILITY:  [facility]  PHYSICIAN:  Leighton Roach Nekesha Font, M.D.DATE OF BIRTH:  1961/02/18  DATE OF ADMISSION:  05/12/2011 DATE OF DISCHARGE:  05/14/2011                              DISCHARGE SUMMARY   ATTENDING PHYSICIAN:  Leighton Roach Naphtali Riede, MD  PRIMARY CARE PROVIDER:  Gentry Fitz.  REASON FOR HOSPITALIZATION:  Chest pain in the setting of cocaine abuse.  DISCHARGE DIAGNOSES: 1. Chest pain likely secondary to vasospasm from cocaine. 2. History of hypertension. 3. Newly diagnosed type 2 diabetes. 4. Cocaine abuse.  MEDICATIONS:  New medications: 1. Tylenol 650 mg p.o. q.8 h. p.r.n. pain. 2. Diltiazem 120 mg p.o. daily 3. Isosorbide mononitrate 60 mg p.o. daily. 4. Lisinopril 5 mg p.o. daily. 5. Nitroglycerin sublingual 0.4 mg sublingual q.5 minutes x3 p.r.n.     chest pain.  CONSULTS:  Cardiology, Dr. Sharyn Lull.  PROCEDURES:  Cardiac catheterization on May 31 showing patent coronaries with no visualized vasospasm and EF of 55-60%.  PERTINENT LABS:  Cardiac enzymes negative x3.  ECG x2 showing diffuse ST and T-wave abnormalities.  UDS positive for cocaine.  Hemoglobin A1c 6.7.  BRIEF HOSPITAL COURSE:  This is a 50 year old male with a history of hypertension, cocaine abuse, and questionable diabetes not on any home medications who presents with chest pain. 1. Chest pain likely secondary to vasospasm from cocaine.  UDS     positive for cocaine.  Although cardiac enzymes were negative x3,     ECG showed diffuse ST elevation and T-wave abnormalities that was     concerning for acute coronary syndrome.  The patient was admitted     to cardiac ICU.  Cardiologist Dr. Sharyn Lull was consulted.  Started     on heparin and nitroglycerin drip, put on oxygen, given full-dose     aspirin with Plavix, as well as low as diltiazem  scheduled.     Cardiac catheterization was performed on hospital day #2 that     showed fully patent coronaries with an EF of 55-60% and no active     vasospasm visualized.  On the day of discharge, the patient had     mild intermittent chest pain despite the nitro drip. Therefore     chest pain was thought not to be cardiac related.  The patient was     satting well on room air.  The patient's blood pressures were 140s-     150s/80s.  The patient was counseled to avoid cocaine to prevent     acute coronary syndrome, given a prescription for nitroglycerin     sublingual p.r.n. and started on isosorbide and diltiazem. 2. History of hypertension.  Not on home medications.  High blood     pressure controlled initially with the nitro drip.  The patient was     started on diltiazem as beta-blockers could not be used in the     setting of cocaine.  Cardiology added isosorbide to diltiazem at     that time of discharge. 3. Newly diagnosed type  2 diabetes.  Diabetic teaching performed.     Hemoglobin A1c 6.7.  Lisinopril 5 mg daily was added for renal     protection. 4. Cocaine abuse.  Counseled.  DISPOSITION:  The patient was discharged to home in stable medical condition.  DISCHARGE INSTRUCTIONS: 1. Follow up with Dr. Edmonia James at [facility] on     June 11 at 2:30 p.m. 2. Follow up with Dr. Sharyn Lull.  Call his office at the time of     discharge for an appointment in 1 week. 3. Return to the ED if chest pain improved with the sublingual     nitroglycerin.  FOLLOWUP ISSUES:  Please follow up on high blood pressure.    ______________________________ Priscella Mann, MD   ______________________________ Leighton Roach Ryan [name], M.D.    AO/MEDQ  D:  05/15/2011  T:  05/15/2011  Job:  [id number]  cc:   Ellin Mayhew, MD Eduardo Osier Sharyn Lull, M.D.  Electronically Signed by Priscella Mann MD on 05/19/2011 10:26:57 AM Electronically Signed by Acquanetta Belling M.D. on 05/19/2011  04:54:09 PM

## 2011-05-21 ENCOUNTER — Encounter: Payer: Self-pay | Admitting: Cardiovascular Disease

## 2011-05-24 ENCOUNTER — Encounter: Payer: Self-pay | Admitting: Cardiovascular Disease

## 2011-05-24 ENCOUNTER — Inpatient Hospital Stay: Payer: Self-pay | Admitting: Family Medicine

## 2011-06-01 ENCOUNTER — Encounter: Payer: Self-pay | Admitting: Cardiovascular Disease

## 2011-06-03 NOTE — Op Note (Signed)
NAMEEFRAIN, Ryan [name]            ACCOUNT NO.:  [id number]  MEDICAL RECORD NO.:  [id number]           PATIENT TYPE:  I  LOCATION:  6527                         FACILITY:  [facility]  PHYSICIAN:  Maddux Vanscyoc N. Sharyn Lull, M.D. DATE OF BIRTH:  Mar 08, 1961  DATE OF PROCEDURE:  05/13/2011 DATE OF DISCHARGE:                              OPERATIVE REPORT   PROCEDURE:  Left cardiac cath with selective left and right coronary angiography, LV graphy via right groin using Judkins technique.  INDICATION FOR THE PROCEDURE:  Ryan [name] is a 50 year old black male with past medical history significant for hypertension, cocaine abuse who was admitted yesterday because of recurrent chest pain off and on since Apr 15, 2011.  Did not seek any medical attention earlier, yesterday chest pain was associated with nausea, vomiting, and diaphoresis, so decided to come to the ER.  States has been smoking cocaine off and on for 20 years and last cocaine use was 4 days ago. Denies history of IVDA.  States initially chest pain was 10/10 and now it comes and goes off and on.  Denies any nausea, vomiting, diaphoresis. Denies shortness of breath.  Denies palpitation, lightheadedness, or syncope.  EKG done showed normal sinus rhythm with septal Q-waves with ST elevation in V1-V3 and ST-T wave changes in inferolateral leads, which were slightly more pronounced as compared to prior EKG.  It also showed LVH.  Due to typical anginal chest pain and recent cocaine abuse, multiple risk factors discussed with the patient regarding left cath, possible PTCA, stenting, its risks and benefits i.e. death, MI, stroke, need for emergency CABG, local vascular complications, etc., and consented for procedure.  PROCEDURE:  After obtaining informed consent, the patient was brought to the Cath Lab and was placed on fluoroscopy table.  Right groin was prepped and draped in usual fashion.  1% Xylocaine was used for local anesthesia in the  right groin.  With the help of thin-wall needle 6- French arterial sheath was placed.  The sheath was aspirated and flushed.  Next 6-French left Judkins catheter was advanced over the wire under fluoroscopic guidance to the ascending aorta.  Wire was pulled out, the catheter was aspirated and connected to the manifold.  The catheter was further advanced and engaged into left coronary ostium. Multiple views of the left system were taken.  Next, catheter was disengaged and was pulled out over the wire and was replaced with a 6- [place] right Judkins catheter, which was advanced over the wire under fluoroscopic guidance up to the ascending aorta.  Wire was pulled out, the catheter was aspirated and connected to the manifold.  Catheter was further advanced and engaged into right coronary ostium.  Multiple views of the right system were taken.  Next catheter was disengaged and was pulled out over the wire and was replaced with a 6-French pigtail catheter, which was advanced over the wire under fluoroscopic guidance up to the ascending aorta.  Catheter was further advanced across the aortic valve into the LV.  LV pressures were recorded.  Next LV graft was done in 30-degree RAO position.  Post angiographic pressures were recorded from LV and  then pullback pressures were recorded from the aorta.  There was no gradient across the aortic valve.  Next, a pigtail catheter was pulled out over the wire.  Sheaths were aspirated and flushed.  FINDINGS:  LV showed good LV systolic function, LVH, EF of 78% to 60%. Left main was short, which was patent.  LAD was patent.  Diagonal one and two were small, which were patent.  Left circumflex was large, which was patent.  OM1 and OM2 were very, very small.  OM3 was large, which was patent.  OM4 and 5 were moderate in size, which were patent.  RCA was small, which was patent.  The patient had left dominant coronary system.  The patient tolerated the procedure  well.  There were no complications.  The patient was transferred to recovery room in stable condition.     Ryan [name] Sharyn Lull, M.D.     MNH/MEDQ  D:  05/13/2011  T:  05/14/2011  Job:  [id number]  cc:   Leighton Roach McDiarmid, M.D.  Electronically Signed by Rinaldo Cloud M.D. on 06/03/2011 09:42:53 AM

## 2011-09-03 LAB — BASIC METABOLIC PANEL
CO2: 22
CO2: 24
Calcium: 8.6
Chloride: 103
Creatinine, Ser: 1.91 — ABNORMAL HIGH
GFR calc Af Amer: 47 — ABNORMAL LOW
GFR calc Af Amer: 53 — ABNORMAL LOW
Glucose, Bld: 114 — ABNORMAL HIGH
Glucose, Bld: 116 — ABNORMAL HIGH
Potassium: 3.5
Sodium: 135
Sodium: 136

## 2011-09-03 LAB — CBC
HCT: 48.8
Hemoglobin: 15.8
MCHC: 33.4
MCHC: 33.4
MCV: 92.6
Platelets: 172
RBC: 5.1
RDW: 13.9
RDW: 13.9

## 2011-09-03 LAB — URINALYSIS, ROUTINE W REFLEX MICROSCOPIC
Hgb urine dipstick: NEGATIVE
Protein, ur: 100 — AB
Specific Gravity, Urine: 1.035 — ABNORMAL HIGH
Urobilinogen, UA: 1

## 2011-09-03 LAB — DIFFERENTIAL
Lymphocytes Relative: 9 — ABNORMAL LOW
Lymphs Abs: 0.7
Monocytes Absolute: 0.9
Monocytes Relative: 12
Neutro Abs: 6.1
Neutrophils Relative %: 79 — ABNORMAL HIGH

## 2011-09-03 LAB — COMPREHENSIVE METABOLIC PANEL
Albumin: 3.8
BUN: 20
Calcium: 8.4
Creatinine, Ser: 2.02 — ABNORMAL HIGH
Potassium: 4
Total Protein: 7

## 2011-09-03 LAB — LIPID PANEL
Cholesterol: 142
HDL: 81
LDL Cholesterol: 52
Total CHOL/HDL Ratio: 1.8

## 2011-09-03 LAB — URINE CULTURE

## 2011-09-03 LAB — URINE MICROSCOPIC-ADD ON

## 2011-09-03 LAB — HEMOGLOBIN A1C: Mean Plasma Glucose: 140

## 2011-09-06 LAB — COMPREHENSIVE METABOLIC PANEL
ALT: 21
AST: 19
Albumin: 3.1 — ABNORMAL LOW
Alkaline Phosphatase: 62
Calcium: 8.6
GFR calc Af Amer: 59 — ABNORMAL LOW
Potassium: 4.3
Sodium: 137
Total Protein: 6

## 2011-09-06 LAB — URINE CULTURE: Culture: NO GROWTH

## 2011-09-06 LAB — CBC
HCT: 40.9
Hemoglobin: 14.1
MCHC: 33.5
Platelets: 159
RDW: 13
WBC: 4.8

## 2011-09-06 LAB — BASIC METABOLIC PANEL
CO2: 23
Calcium: 8.4
Creatinine, Ser: 1.41
GFR calc non Af Amer: 52 — ABNORMAL LOW
GFR calc non Af Amer: 55 — ABNORMAL LOW
Glucose, Bld: 82
Glucose, Bld: 97
Potassium: 3.9
Sodium: 132 — ABNORMAL LOW
Sodium: 136

## 2011-09-10 LAB — COMPREHENSIVE METABOLIC PANEL
Alkaline Phosphatase: 77
BUN: 16
Calcium: 8.6
Glucose, Bld: 111 — ABNORMAL HIGH
Total Protein: 6.4

## 2011-09-10 LAB — CBC
HCT: 43.4
Hemoglobin: 14.6
MCHC: 33.8
RDW: 13.9

## 2011-09-10 LAB — URINALYSIS, ROUTINE W REFLEX MICROSCOPIC
Hgb urine dipstick: NEGATIVE
Specific Gravity, Urine: 1.022
Urobilinogen, UA: 1

## 2011-09-10 LAB — DIFFERENTIAL
Basophils Relative: 1
Lymphs Abs: 2.5
Monocytes Relative: 16 — ABNORMAL HIGH
Neutro Abs: 2.5
Neutrophils Relative %: 41 — ABNORMAL LOW

## 2011-09-17 ENCOUNTER — Emergency Department (HOSPITAL_COMMUNITY)
Admission: EM | Admit: 2011-09-17 | Discharge: 2011-09-18 | Disposition: A | Discharge status: Home / Self Care | Payer: Self-pay | Attending: Emergency Medicine | Admitting: Emergency Medicine

## 2011-09-17 DIAGNOSIS — S61409A Unspecified open wound of unspecified hand, initial encounter: Secondary | ICD-10-CM | POA: Insufficient documentation

## 2011-09-17 DIAGNOSIS — E119 Type 2 diabetes mellitus without complications: Secondary | ICD-10-CM | POA: Insufficient documentation

## 2011-09-17 DIAGNOSIS — I10 Essential (primary) hypertension: Secondary | ICD-10-CM | POA: Insufficient documentation

## 2011-10-02 ENCOUNTER — Emergency Department (HOSPITAL_COMMUNITY)
Admission: EM | Admit: 2011-10-02 | Discharge: 2011-10-02 | Disposition: A | Discharge status: Home / Self Care | Payer: Self-pay | Attending: Emergency Medicine | Admitting: Emergency Medicine

## 2011-10-02 DIAGNOSIS — Z4802 Encounter for removal of sutures: Secondary | ICD-10-CM | POA: Insufficient documentation

## 2011-10-02 DIAGNOSIS — S61409A Unspecified open wound of unspecified hand, initial encounter: Secondary | ICD-10-CM | POA: Insufficient documentation

## 2011-10-02 DIAGNOSIS — X58XXXA Exposure to other specified factors, initial encounter: Secondary | ICD-10-CM | POA: Insufficient documentation

## 2011-10-02 DIAGNOSIS — E119 Type 2 diabetes mellitus without complications: Secondary | ICD-10-CM | POA: Insufficient documentation

## 2011-10-02 DIAGNOSIS — I10 Essential (primary) hypertension: Secondary | ICD-10-CM | POA: Insufficient documentation

## 2013-06-11 ENCOUNTER — Encounter (HOSPITAL_COMMUNITY): Payer: Self-pay | Admitting: Emergency Medicine

## 2013-06-11 ENCOUNTER — Emergency Department (HOSPITAL_COMMUNITY): Payer: Self-pay

## 2013-06-11 ENCOUNTER — Emergency Department (HOSPITAL_COMMUNITY)
Admission: EM | Admit: 2013-06-11 | Discharge: 2013-06-11 | Disposition: A | Discharge status: Home / Self Care | Payer: Self-pay | Attending: Emergency Medicine | Admitting: Emergency Medicine

## 2013-06-11 DIAGNOSIS — Z8679 Personal history of other diseases of the circulatory system: Secondary | ICD-10-CM | POA: Insufficient documentation

## 2013-06-11 DIAGNOSIS — I10 Essential (primary) hypertension: Secondary | ICD-10-CM | POA: Insufficient documentation

## 2013-06-11 DIAGNOSIS — R1032 Left lower quadrant pain: Secondary | ICD-10-CM | POA: Insufficient documentation

## 2013-06-11 DIAGNOSIS — E119 Type 2 diabetes mellitus without complications: Secondary | ICD-10-CM | POA: Insufficient documentation

## 2013-06-11 DIAGNOSIS — Z79899 Other long term (current) drug therapy: Secondary | ICD-10-CM | POA: Insufficient documentation

## 2013-06-11 DIAGNOSIS — R109 Unspecified abdominal pain: Secondary | ICD-10-CM

## 2013-06-11 DIAGNOSIS — F1410 Cocaine abuse, uncomplicated: Secondary | ICD-10-CM | POA: Insufficient documentation

## 2013-06-11 DIAGNOSIS — Z794 Long term (current) use of insulin: Secondary | ICD-10-CM | POA: Insufficient documentation

## 2013-06-11 LAB — CBC WITH DIFFERENTIAL/PLATELET
Eosinophils Relative: 1 % (ref 0–5)
Lymphocytes Relative: 43 % (ref 12–46)
Lymphs Abs: 3 10*3/uL (ref 0.7–4.0)
MCV: 86.9 fL (ref 78.0–100.0)
Neutro Abs: 3.1 10*3/uL (ref 1.7–7.7)
Neutrophils Relative %: 44 % (ref 43–77)
Platelets: 223 10*3/uL (ref 150–400)
RBC: 4.8 MIL/uL (ref 4.22–5.81)
WBC: 7 10*3/uL (ref 4.0–10.5)

## 2013-06-11 LAB — COMPREHENSIVE METABOLIC PANEL
ALT: 27 U/L (ref 0–53)
AST: 27 U/L (ref 0–37)
Alkaline Phosphatase: 104 U/L (ref 39–117)
CO2: 25 mEq/L (ref 19–32)
Chloride: 104 mEq/L (ref 96–112)
GFR calc Af Amer: 53 mL/min — ABNORMAL LOW (ref >90)
GFR calc non Af Amer: 46 mL/min — ABNORMAL LOW (ref >90)
Glucose, Bld: 138 mg/dL — ABNORMAL HIGH (ref 70–99)
Potassium: 4 mEq/L (ref 3.5–5.1)
Sodium: 142 mEq/L (ref 135–145)
Total Bilirubin: 0.2 mg/dL — ABNORMAL LOW (ref 0.3–1.2)

## 2013-06-11 MED ORDER — ONDANSETRON HCL 4 MG/2ML IJ SOLN
4.0000 mg | INTRAMUSCULAR | Status: AC
  Administered 2013-06-11: 4 mg via INTRAVENOUS
  Filled 2013-06-11: qty 2

## 2013-06-11 MED ORDER — IOHEXOL 300 MG/ML  SOLN
100.0000 mL | Freq: Once | INTRAMUSCULAR | Status: AC | PRN
  Administered 2013-06-11: 100 mL via INTRAVENOUS

## 2013-06-11 MED ORDER — MORPHINE SULFATE 4 MG/ML IJ SOLN
4.0000 mg | Freq: Once | INTRAMUSCULAR | Status: AC
  Administered 2013-06-11: 4 mg via INTRAVENOUS
  Filled 2013-06-11: qty 1

## 2013-06-11 MED ORDER — SODIUM CHLORIDE 0.9 % IV BOLUS (SEPSIS)
1000.0000 mL | Freq: Once | INTRAVENOUS | Status: AC
  Administered 2013-06-11: 1000 mL via INTRAVENOUS

## 2013-06-11 NOTE — ED Notes (Signed)
Pt c/o BRB rectal bleeding starting 1 hour ago; pt denies hx of similar

## 2013-06-11 NOTE — ED Notes (Signed)
Pt. Given supplies and instructions on collecting stool culture. Pt. Verbalized understanding.

## 2013-06-11 NOTE — ED Provider Notes (Signed)
History    CSN: [id number] Arrival date & time 06/11/13  1435  First MD Initiated Contact with Patient 06/11/13 1935     Chief Complaint  Patient presents with  . Rectal Bleeding   (Consider location/radiation/quality/duration/timing/severity/associated sxs/prior Treatment) HPI Comments: Patient is a 52 year old male with a history of diabetes and hypertension who presents for bloody bowel movements x2 today. Patient states that he awoke been used the bathroom and noticed gross blood in the toilet bowl. This happened a second time today and patient denies having a normal bowel movement since symptom onset. Patient denies any aggravating or alleviating factors. He admits to some associated discomfort in his left lower abdomen. Patient denies fevers, chest pain or shortness of breath, nausea or vomiting, urinary symptoms, and numbness or tingling in his extremities. Patient further denies eating any raw or undercooked meats as well as any recent travel.  Patient is a 52 y.o. male presenting with hematochezia. The history is provided by the patient. No language interpreter was used.  Rectal Bleeding Associated symptoms: abdominal pain (mild LLQ) and light-headedness (mild)   Associated symptoms: no fever and no vomiting    Past Medical History  Diagnosis Date  . Diabetes mellitus   . Hypertension   . Drug abuse     cocaine abuse  . Chest pain    Past Surgical History  Procedure Laterality Date  . Cardiac catheterization  05.31.2012    showing patent coronaries with no visualized vasospasm and EF of 55-60 %  . Orbital fracture surgery     History reviewed. No pertinent family history. History  Substance Use Topics  . Smoking status: Never Smoker   . Smokeless tobacco: Not on file  . Alcohol Use: Yes    Review of Systems  Constitutional: Negative for fever.  Respiratory: Negative for shortness of breath.   Cardiovascular: Negative for chest pain.  Gastrointestinal: Positive  for abdominal pain (mild LLQ), blood in stool (gross red blood per rectum) and hematochezia. Negative for nausea and vomiting.  Neurological: Positive for light-headedness (mild). Negative for facial asymmetry, weakness and numbness.  All other systems reviewed and are negative.    Allergies  Review of patient's allergies indicates no known allergies.  Home Medications   Current Outpatient Rx  Name  Route  Sig  Dispense  Refill  . amLODipine (NORVASC) 10 MG tablet   Oral   Take 10 mg by mouth daily.         . insulin NPH (HUMULIN N,NOVOLIN N) 100 UNIT/ML injection   Subcutaneous   Inject 20 Units into the skin 2 (two) times daily.         Marland Kitchen lisinopril-hydrochlorothiazide (PRINZIDE,ZESTORETIC) 20-25 MG per tablet   Oral   Take 1 tablet by mouth daily.         . meloxicam (MOBIC) 15 MG tablet   Oral   Take 15 mg by mouth daily.         Marland Kitchen omeprazole (PRILOSEC) 20 MG capsule   Oral   Take 20 mg by mouth daily. Take daily before a meal          BP 164/92  Pulse 70  Temp(Src) 98 F (36.7 C) (Oral)  Resp 20  SpO2 99% Physical Exam  Nursing note and vitals reviewed. Constitutional: He is oriented to person, place, and time. He appears well-developed and well-nourished. No distress.  HENT:  Head: Normocephalic and atraumatic.  Mouth/Throat: Oropharynx is clear and moist. No oropharyngeal exudate.  Eyes:  Conjunctivae and EOM are normal. Pupils are equal, round, and reactive to light.  Neck: Normal range of motion. Neck supple.  Cardiovascular: Normal rate, regular rhythm and normal heart sounds.   Pulmonary/Chest: Effort normal and breath sounds normal. No respiratory distress. He has no wheezes. He has no rales.  Abdominal: Soft. Bowel sounds are normal. He exhibits no distension and no mass. There is tenderness (mild focal LLQ). There is no rebound and no guarding.  Genitourinary: Rectum normal. Rectal exam shows no external hemorrhoid, no internal hemorrhoid, no  fissure, no mass, no tenderness and anal tone normal.  Brown/yellow stool appreciated on rectal exam; no gross blood appreciated.  Musculoskeletal: Normal range of motion.  Lymphadenopathy:    He has no cervical adenopathy.  Neurological: He is alert and oriented to person, place, and time.  Skin: Skin is warm and dry. No rash noted. He is not diaphoretic. No erythema. No pallor.  Psychiatric: He has a normal mood and affect. His behavior is normal.    ED Course  Procedures (including critical care time) Labs Reviewed  COMPREHENSIVE METABOLIC PANEL - Abnormal; Notable for the following:    Glucose, Bld 138 (*)    Creatinine, Ser 1.70 (*)    Total Bilirubin 0.2 (*)    GFR calc non Af Amer 46 (*)    GFR calc Af Amer 53 (*)    All other components within normal limits  GLUCOSE, CAPILLARY - Abnormal; Notable for the following:    Glucose-Capillary 160 (*)    All other components within normal limits  STOOL CULTURE  CBC WITH DIFFERENTIAL  GLUCOSE, CAPILLARY  OCCULT BLOOD, POC DEVICE   Ct Abdomen Pelvis W Contrast  06/11/2013   *RADIOLOGY REPORT*  Clinical Data: Blood in stool.  Left lower quadrant pain.  CT ABDOMEN AND PELVIS WITH CONTRAST  Technique:  Multidetector CT imaging of the abdomen and pelvis was performed following the standard protocol during bolus administration of intravenous contrast.  Contrast: OMNIPAQUE IOHEXOL 300 MG/ML  SOLN  Comparison: CT abdomen and pelvis 02/09/2013.  Findings: Mild dependent atelectasis is seen in the lung bases. The patient has a small hiatal hernia.  There is some oral contrast in the distal esophagus.  No pleural or pericardial effusion.  The liver is diffusely low attenuating consistent with fatty infiltration.  The spleen, adrenal glands, pancreas, gallbladder, kidneys and biliary tree appear normal.  A few scattered colonic diverticula are identified without diverticulitis.  The colon otherwise appears normal.  The stomach, small bowel and  appendix are unremarkable.  There is no lymphadenopathy or fluid. No focal bony abnormality is identified.  IMPRESSION:  1. No finding to explain the patient's symptoms. 2.  Fatty infiltration of the liver. 3.  Small hiatal hernia.  Oral contrast material in the distal esophagus could be due to reflux disease and/or poor motility. 4.  Mild diverticulosis without diverticulitis.   Original Report Authenticated By: Holley Dexter, M.D.    1. Abdominal pain     MDM  Abdominal discomfort and LLQ pain with BRBPR - physical exam as above. No peritoneal signs or evidence of acute surgical abdomen. Rectal exam normal with brown/yellow stool appreciated without gross blood. Workup to include CBC, CMP, Hemoccult, and stool culture. Patient afebrile, hemodynamically stable, and no acute distress. CT abdomen and pelvis with contrast also ordered to further evaluate etiology of patient's symptoms.  Hemoccult-negative. Patient also without leukocytosis, anemia, hemoconcentration, and electrolyte imbalance. Liver function preserved. Creatinine slightly elevated, however consistent with  creatinine from 2 years ago. CT abdomen and pelvis with no evidence of diverticulitis. No evidence of bowel obstruction or appendicitis. Patient tolerating food and fluids by mouth. Given workup, believe patient is appropriate for discharge with primary care followup for further evaluation of symptoms. Indications for ED return discussed with the patient who verbalizes comfort and understanding with this discharge plan. Patient work up and management discussed with Dr. Rubin Payor who is in agreement.    Antony Madura, [place] 06/15/13 1912

## 2013-06-11 NOTE — ED Notes (Signed)
X 2 bloody stools; acuity upgraded to a 2.

## 2013-06-15 NOTE — ED Provider Notes (Signed)
Medical screening examination/treatment/procedure(s) were performed by non-physician practitioner and as supervising physician I was immediately available for consultation/collaboration.  Kaydince Towles R. Caylen Kuwahara, MD 06/15/13 2023 

## 2013-08-06 ENCOUNTER — Emergency Department (HOSPITAL_COMMUNITY)
Admission: EM | Admit: 2013-08-06 | Discharge: 2013-08-06 | Disposition: A | Discharge status: Home / Self Care | Payer: Self-pay | Attending: Emergency Medicine | Admitting: Emergency Medicine

## 2013-08-06 ENCOUNTER — Encounter (HOSPITAL_COMMUNITY): Payer: Self-pay | Admitting: *Deleted

## 2013-08-06 ENCOUNTER — Emergency Department (HOSPITAL_COMMUNITY): Payer: Self-pay

## 2013-08-06 DIAGNOSIS — Z79899 Other long term (current) drug therapy: Secondary | ICD-10-CM | POA: Insufficient documentation

## 2013-08-06 DIAGNOSIS — M545 Low back pain, unspecified: Secondary | ICD-10-CM | POA: Insufficient documentation

## 2013-08-06 DIAGNOSIS — N289 Disorder of kidney and ureter, unspecified: Secondary | ICD-10-CM | POA: Insufficient documentation

## 2013-08-06 DIAGNOSIS — E119 Type 2 diabetes mellitus without complications: Secondary | ICD-10-CM | POA: Insufficient documentation

## 2013-08-06 DIAGNOSIS — M549 Dorsalgia, unspecified: Secondary | ICD-10-CM

## 2013-08-06 DIAGNOSIS — Z794 Long term (current) use of insulin: Secondary | ICD-10-CM | POA: Insufficient documentation

## 2013-08-06 DIAGNOSIS — F1410 Cocaine abuse, uncomplicated: Secondary | ICD-10-CM | POA: Insufficient documentation

## 2013-08-06 DIAGNOSIS — I10 Essential (primary) hypertension: Secondary | ICD-10-CM | POA: Insufficient documentation

## 2013-08-06 DIAGNOSIS — R109 Unspecified abdominal pain: Secondary | ICD-10-CM | POA: Insufficient documentation

## 2013-08-06 DIAGNOSIS — F1210 Cannabis abuse, uncomplicated: Secondary | ICD-10-CM | POA: Insufficient documentation

## 2013-08-06 HISTORY — DX: Disorder of kidney and ureter, unspecified: N28.9

## 2013-08-06 LAB — URINALYSIS W MICROSCOPIC + REFLEX CULTURE
Glucose, UA: NEGATIVE mg/dL
Leukocytes, UA: NEGATIVE
Specific Gravity, Urine: 1.025 (ref 1.005–1.030)

## 2013-08-06 MED ORDER — OXYCODONE-ACETAMINOPHEN 5-325 MG PO TABS: 1.0000 | ORAL_TABLET | ORAL | Status: DC | PRN

## 2013-08-06 MED ORDER — OXYCODONE-ACETAMINOPHEN 5-325 MG PO TABS
2.0000 | ORAL_TABLET | Freq: Once | ORAL | Status: AC
  Administered 2013-08-06: 2 via ORAL
  Filled 2013-08-06: qty 2

## 2013-08-06 MED ORDER — DIAZEPAM 5 MG PO TABS
5.0000 mg | ORAL_TABLET | Freq: Once | ORAL | Status: AC
  Administered 2013-08-06: 5 mg via ORAL
  Filled 2013-08-06: qty 1

## 2013-08-06 MED ORDER — MORPHINE SULFATE 4 MG/ML IJ SOLN
4.0000 mg | Freq: Once | INTRAMUSCULAR | Status: AC
  Administered 2013-08-06: 4 mg via INTRAMUSCULAR
  Filled 2013-08-06: qty 1

## 2013-08-06 MED ORDER — METHOCARBAMOL 500 MG PO TABS: 1000.0000 mg | ORAL_TABLET | Freq: Four times a day (QID) | ORAL | Status: DC

## 2013-08-06 NOTE — ED Notes (Signed)
Pt aware of need of urine specimen; urinal handed to pt to attempt to provide a sample; family at bedside

## 2013-08-06 NOTE — ED Notes (Signed)
PER EMS: pt from home, reports back pain x 3 days. Denies CP,SOB, N/V/D. Denies trauma, fall, "I was just sitting there and it just seized up." Hx of DMII,  HTN. BP 162/100. HR-80, CBG-115.

## 2013-08-06 NOTE — ED Notes (Signed)
Pt tearful and complaining of back pain, appears very uncomfortable. Resident at bedside at this moment.

## 2013-08-06 NOTE — ED Notes (Signed)
Pt states that his back pain started about 3 days ago. He states that nothing precipitated the start of the back pain. He also states that he doesn't have history of back pain.

## 2013-08-06 NOTE — ED Provider Notes (Signed)
CSN: [id number]     Arrival date & time 08/06/13  3086 History     First MD Initiated Contact with Patient 08/06/13 0715     Chief Complaint  Patient presents with  . Back Pain   (Consider location/radiation/quality/duration/timing/severity/associated sxs/prior Treatment) HPI 52 y o B M with PMH of DM2, HTN, chest pain due to cocaine abuse with hx of marijuana abuse, presented today with c/o back pain, present for the past three days but became very severe this morning, now a 10/10 pain, non-radiating, no relieving or aggravating factors. Pt is a Product manager. He said he did no heavy lifting prior to onset of pain 3 days ago or strenous activity,no trauma or falls, no shooting pains down his legs, no paraesthesia, numbness or weakness of his extremities, no urinary or fecal incontinence or retention, no fever, never smoked cigarettes, no redness, bloody urine or change in urine colour, last use of cocaine- 2days ago to help relieve his pain,he denies ever using intravenous drugs, says he snorts the cocaine.  Past Medical History  Diagnosis Date  . Diabetes mellitus   . Hypertension   . Drug abuse     cocaine, marijuana abuse  . Chest pain     due to cocaine use  . Renal insufficiency    Past Surgical History  Procedure Laterality Date  . Cardiac catheterization  05.31.2012    showing patent coronaries with no visualized vasospasm and EF of 55-60 %  . Orbital fracture surgery     No family history on file. History  Substance Use Topics  . Smoking status: Never Smoker   . Smokeless tobacco: Not on file  . Alcohol Use: Yes    Review of Systems CONSTITUTIONAL- No Fever, weightloss, no change in appetite. SKIN- No Rash, colour changes, itching. HEAD- No Headache, dizziness. RESPIRATORY- No Cough, SOB. CARDIAC- No Palpitations, no chest pain. GI- No nausea, vomiting, diarrhoea, constipation, abd pain. URINARY-No Frequency, or dysuria. NEUROLOGIC- No Numbness or  syncope.  Allergies  Review of patient's allergies indicates no known allergies.  Home Medications   Current Outpatient Rx  Name  Route  Sig  Dispense  Refill  . amLODipine (NORVASC) 10 MG tablet   Oral   Take 10 mg by mouth daily.         . insulin NPH (HUMULIN N,NOVOLIN N) 100 UNIT/ML injection   Subcutaneous   Inject 20 Units into the skin 2 (two) times daily.         Marland Kitchen lisinopril-hydrochlorothiazide (PRINZIDE,ZESTORETIC) 20-25 MG per tablet   Oral   Take 1 tablet by mouth daily.         . meloxicam (MOBIC) 15 MG tablet   Oral   Take 15 mg by mouth daily.         Marland Kitchen omeprazole (PRILOSEC) 20 MG capsule   Oral   Take 20 mg by mouth daily. Take daily before a meal          BP 166/102  Pulse 79  Temp(Src) 98.1 F (36.7 C) (Oral)  Ht 5\' 9"  (1.753 m)  Wt 240 lb (108.863 kg)  BMI 35.43 kg/m2  SpO2 98% Physical Exam GENERAL- alert, appears as stated age, in sever painful distress, crying. BACK- Normal curvature of the spine, tenderness along the vertebrae, worse in the Rt paravertebral area. Straight leg raising test +ve on the Rt at 90 degrees, though patient is in constant pain, he says pain is worse.  EXTREM- puilses 2+ and symm, normal  strenght in all extrem, sensation intact. HEENT- Atraumatic, normocephalic,Pupils-  EOMI, oral mucosa moist. No carotid bruit, no cervical LN enlargement, thyroid does not appear enlarged. CARDIAC- RRR, no murmurs, rubs or gallops. RESP- Moving equal volumes of air, and clear to auscultation bilaterally. Rest Of Physical Exam done when patient was in less severe pain.  ABDOMINAL- Soft, non tender, no palpable masses or organomegaly, bowel sound normoactive. NEURO- Cr N 2-12 intact. PSYCH- Normal mood and affect, appropriate speech and thought content.  ED Course   Procedures (including critical care time)  Labs Reviewed  URINALYSIS W MICROSCOPIC + REFLEX CULTURE - Abnormal; Notable for the following:    Hgb urine  dipstick TRACE (*)    All other components within normal limits   Ct Abdomen Pelvis Wo Contrast  08/06/2013   *RADIOLOGY REPORT*  Clinical Data: Right-sided flank pain.  CT ABDOMEN AND PELVIS WITHOUT CONTRAST  Technique:  Multidetector CT imaging of the abdomen and pelvis was performed following the standard protocol without intravenous contrast.  Comparison: CT of the abdomen and pelvis 06/11/2013.  Findings:  Lung Bases: Moderate sized hiatal hernia.  Abdomen/Pelvis:  No abnormal calcifications are noted within the collecting system of either kidney, along the course of either ureter, or within the lumen of the urinary bladder.  No hydroureteronephrosis to suggest urinary tract obstruction at this time.  Mild bilateral perinephric stranding is nonspecific, and similar to the prior study. The unenhanced appearance of the kidneys is otherwise unremarkable bilaterally.  The unenhanced appearance of the liver, gallbladder, pancreas, spleen and bilateral adrenal glands is unremarkable.  Normal appendix.  No significant volume of ascites.  No pneumoperitoneum. No pathologic distension of small bowel.  No definite pathologic lymphadenopathy identified within the abdomen or pelvis.  Prostate gland and urinary bladder are unremarkable in appearance.  Musculoskeletal: Mild multilevel degenerative disc disease throughout the visualized thoracolumbar spine, including 3 mm of anterolisthesis of L4 upon L5. There are no aggressive appearing lytic or blastic lesions noted in the visualized portions of the skeleton.  IMPRESSION: 1.  No acute findings in the abdomen or pelvis to account for the patient's symptoms.  Specifically, no abnormal urinary tract calculi or findings of urinary tract obstruction. 2.  Additionally, the appendix is normal. 3.  Moderate sized hiatal hernia. 4.  3 mm of anterolisthesis of L4 upon L5.   Original Report Authenticated By: Trudie Reed, M.D.   No diagnosis found.  MDM  Back pain-  Considerations are for herniated disc, Nephrolitiasis. Iv morphine 4 mg given, CT abd Wo contrast, and Urinalysis ordered. Abd Ct- Showed no findings to account for back pain, 3mm anterolistesis of L4 on L5, Urinalysis within normal limits. 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 - Patient Cr appears to have been elevated on several occasions, last Cr- 1.7 ~1 month ago, will avoid NSAIDs for now, will discharge pt home on Percocet- 1tab Q6H for 5 days and Robaxin 1000mg  QID for 3 days. Pt advice to come back to the ED if he developes weakness of his extremites or incontinence of feces or urine. To follow up with PCP, contact information given to pt.  Kennis Carina, MD 08/06/13 1441  Kennis Carina, MD 08/06/13 1442

## 2013-08-08 ENCOUNTER — Encounter (HOSPITAL_COMMUNITY): Payer: Self-pay | Admitting: *Deleted

## 2013-08-08 ENCOUNTER — Emergency Department (HOSPITAL_COMMUNITY)
Admission: EM | Admit: 2013-08-08 | Discharge: 2013-08-08 | Disposition: A | Discharge status: Home / Self Care | Payer: Self-pay | Attending: Emergency Medicine | Admitting: Emergency Medicine

## 2013-08-08 DIAGNOSIS — F1910 Other psychoactive substance abuse, uncomplicated: Secondary | ICD-10-CM | POA: Insufficient documentation

## 2013-08-08 DIAGNOSIS — Z794 Long term (current) use of insulin: Secondary | ICD-10-CM | POA: Insufficient documentation

## 2013-08-08 DIAGNOSIS — F1010 Alcohol abuse, uncomplicated: Secondary | ICD-10-CM | POA: Insufficient documentation

## 2013-08-08 DIAGNOSIS — E119 Type 2 diabetes mellitus without complications: Secondary | ICD-10-CM | POA: Insufficient documentation

## 2013-08-08 DIAGNOSIS — N289 Disorder of kidney and ureter, unspecified: Secondary | ICD-10-CM | POA: Insufficient documentation

## 2013-08-08 DIAGNOSIS — Z79899 Other long term (current) drug therapy: Secondary | ICD-10-CM | POA: Insufficient documentation

## 2013-08-08 DIAGNOSIS — I129 Hypertensive chronic kidney disease with stage 1 through stage 4 chronic kidney disease, or unspecified chronic kidney disease: Secondary | ICD-10-CM | POA: Insufficient documentation

## 2013-08-08 DIAGNOSIS — M62838 Other muscle spasm: Secondary | ICD-10-CM | POA: Insufficient documentation

## 2013-08-08 DIAGNOSIS — M62830 Muscle spasm of back: Secondary | ICD-10-CM

## 2013-08-08 MED ORDER — KETOROLAC TROMETHAMINE 60 MG/2ML IM SOLN
60.0000 mg | Freq: Once | INTRAMUSCULAR | Status: AC
  Administered 2013-08-08: 60 mg via INTRAMUSCULAR
  Filled 2013-08-08: qty 2

## 2013-08-08 MED ORDER — DIAZEPAM 5 MG PO TABS: 5.0000 mg | ORAL_TABLET | Freq: Two times a day (BID) | ORAL | Status: DC

## 2013-08-08 NOTE — ED Provider Notes (Signed)
CSN: [id number]     Arrival date & time 08/08/13  1244 History   First MD Initiated Contact with Patient 08/08/13 1306     Chief Complaint  Patient presents with  . Back Pain   (Consider location/radiation/quality/duration/timing/severity/associated sxs/prior Treatment) HPI  Ryan [name] is a 52 y.o.male with a significant PMH of diabetes, hypertension, cocaine abuse, alcohol use presents to the ER with complaints of right side back spasms. He was seen a few days ago for the same thing and had a CT scan done, lab work, urine taken and was diagnosed with back spasm if everything else was normal. He is given a prescription for Percocet which he says has not helped at all. He tells me that whenever he tries to turn to reach for something that he gets really sharp pulling catch in his right side. He denies pain midline, hips, left side. He has not had any new symptoms of dysuria, weakness, fevers, nausea, vomiting, diarrhea chest pain.   Past Medical History  Diagnosis Date  . Diabetes mellitus   . Hypertension   . Drug abuse     cocaine, marijuana abuse  . Chest pain     due to cocaine use  . Renal insufficiency    Past Surgical History  Procedure Laterality Date  . Cardiac catheterization  05.31.2012    showing patent coronaries with no visualized vasospasm and EF of 55-60 %  . Orbital fracture surgery     History reviewed. No pertinent family history. History  Substance Use Topics  . Smoking status: Never Smoker   . Smokeless tobacco: Not on file  . Alcohol Use: Yes    Review of Systems ROS is negative unless otherwise stated in the HPI   Allergies  Review of patient's allergies indicates no known allergies.  Home Medications   Current Outpatient Rx  Name  Route  Sig  Dispense  Refill  . amLODipine (NORVASC) 10 MG tablet   Oral   Take 10 mg by mouth daily.         . insulin NPH (HUMULIN N,NOVOLIN N) 100 UNIT/ML injection   Subcutaneous   Inject 20 Units into  the skin 2 (two) times daily.         Marland Kitchen lisinopril-hydrochlorothiazide (PRINZIDE,ZESTORETIC) 20-25 MG per tablet   Oral   Take 1 tablet by mouth daily.         Marland Kitchen omeprazole (PRILOSEC) 20 MG capsule   Oral   Take 20 mg by mouth daily. Take daily before a meal         . diazepam (VALIUM) 5 MG tablet   Oral   Take 1 tablet (5 mg total) by mouth 2 (two) times daily.   10 tablet   0    BP 172/88  Pulse 92  Temp(Src) 98 F (36.7 C) (Oral)  Resp 24  SpO2 99% Physical Exam  Nursing note and vitals reviewed. Constitutional: He appears well-developed and well-nourished. No distress.  HENT:  Head: Normocephalic and atraumatic.  Eyes: Pupils are equal, round, and reactive to light.  Neck: Normal range of motion. Neck supple.  Cardiovascular: Normal rate and regular rhythm.   Pulmonary/Chest: Effort normal.  Abdominal: Soft.  Musculoskeletal:       Lumbar back: He exhibits decreased range of motion, tenderness, pain and spasm. He exhibits no bony tenderness, no swelling, no edema, no deformity, no laceration and normal pulse.       Back:   Equal strength to bilateral lower extremities.  Neurosensory function adequate to both legs. Skin color is normal. Skin is warm and moist. I see no step off deformity, no bony tenderness. Pt is able to ambulate without limp. Pain is relieved when sitting in certain positions. ROM is decreased due to pain. No crepitus, laceration, effusion, swelling.  Pulses are normal   Neurological: He is alert.  Skin: Skin is warm and dry.    ED Course  Procedures (including critical care time) Labs Review Labs Reviewed - No data to display Imaging Review No results found.  MDM   1. Spasm of back muscles    Patient with back pain. No neurological deficits. Patient is ambulatory. No warning symptoms of back pain including: loss of bowel or bladder control, night sweats, waking from sleep with back pain, unexplained fevers or weight loss, h/o cancer,  IVDU, recent significant  trauma. No concern for cauda equina, epidural abscess, or other serious cause of back pain. Conservative measures such as rest, ice/heat and pain medicine indicated with PCP follow-up if no improvement with conservative management.   52 y.o.[name] evaluation in the Emergency Department is complete. It has been determined that no acute conditions requiring further emergency intervention are present at this time. The patient/guardian have been advised of the diagnosis and plan. We have discussed signs and symptoms that warrant return to the ED, such as changes or worsening in symptoms.  Vital signs are stable at discharge. Filed Vitals:   08/08/13 1250  BP: 172/88  Pulse: 92  Temp: 98 F (36.7 C)  Resp: 24    Patient/guardian has voiced understanding and agreed to follow-up with the PCP or specialist.     Dorthula Matas, PA-C 08/08/13 1329

## 2013-08-08 NOTE — ED Provider Notes (Signed)
Medical screening examination/treatment/procedure(s) were performed by non-physician practitioner and as supervising physician I was immediately available for consultation/collaboration.   Junius Argyle, MD 08/08/13 2001

## 2013-08-08 NOTE — ED Notes (Signed)
Pt reports back pain on the right flank.  Hx of same-was seen in the ED 2 days ago.  Reports rx is not working for pain anymore.  Denies urinary symptoms.

## 2013-08-08 NOTE — ED Provider Notes (Signed)
I saw and evaluated the patient, reviewed the resident's note and I agree with the findings and plan.  52yo M, c/o right sided LBP x3 days.  Pain worsens with palpation of the area and body position changes. Denies incont/retention of bowel or bladder, no saddle anesthesia, no focal motor weakness, no tingling/numbness in extremities, no fevers, no injury, no abd pain. Uncomfortable appearing, CTA, RRR, abd soft/NT, +TTP right lumbar paraspinal muscles. Strength 5/5 equal bilat UE's and LE's, including great toe dorsiflexion.  DTR 2/4 equal bilat UE's and LE's.  No gross sensory deficits. Gait steady. UA, CT scan reassuring. No red flags on hx or PE. Tx symptomatically for msk pain, d/c with f/u PMD.     Laray Anger, DO 08/08/13 2113

## 2013-11-06 ENCOUNTER — Emergency Department (HOSPITAL_COMMUNITY): Payer: Self-pay

## 2013-11-06 ENCOUNTER — Emergency Department (HOSPITAL_COMMUNITY)
Admission: EM | Admit: 2013-11-06 | Discharge: 2013-11-06 | Disposition: A | Discharge status: Home / Self Care | Payer: Self-pay | Attending: Emergency Medicine | Admitting: Emergency Medicine

## 2013-11-06 ENCOUNTER — Encounter (HOSPITAL_COMMUNITY): Payer: Self-pay | Admitting: Emergency Medicine

## 2013-11-06 DIAGNOSIS — E119 Type 2 diabetes mellitus without complications: Secondary | ICD-10-CM | POA: Insufficient documentation

## 2013-11-06 DIAGNOSIS — Z79899 Other long term (current) drug therapy: Secondary | ICD-10-CM | POA: Insufficient documentation

## 2013-11-06 DIAGNOSIS — R109 Unspecified abdominal pain: Secondary | ICD-10-CM

## 2013-11-06 DIAGNOSIS — R197 Diarrhea, unspecified: Secondary | ICD-10-CM | POA: Insufficient documentation

## 2013-11-06 DIAGNOSIS — R1084 Generalized abdominal pain: Secondary | ICD-10-CM | POA: Insufficient documentation

## 2013-11-06 DIAGNOSIS — R112 Nausea with vomiting, unspecified: Secondary | ICD-10-CM | POA: Insufficient documentation

## 2013-11-06 DIAGNOSIS — Z794 Long term (current) use of insulin: Secondary | ICD-10-CM | POA: Insufficient documentation

## 2013-11-06 DIAGNOSIS — Z95818 Presence of other cardiac implants and grafts: Secondary | ICD-10-CM | POA: Insufficient documentation

## 2013-11-06 DIAGNOSIS — I10 Essential (primary) hypertension: Secondary | ICD-10-CM | POA: Insufficient documentation

## 2013-11-06 DIAGNOSIS — Z87448 Personal history of other diseases of urinary system: Secondary | ICD-10-CM | POA: Insufficient documentation

## 2013-11-06 LAB — URINALYSIS, ROUTINE W REFLEX MICROSCOPIC
Bilirubin Urine: NEGATIVE
Glucose, UA: NEGATIVE mg/dL
Hgb urine dipstick: NEGATIVE
Ketones, ur: NEGATIVE mg/dL
Protein, ur: NEGATIVE mg/dL
pH: 6 (ref 5.0–8.0)

## 2013-11-06 LAB — CBC WITH DIFFERENTIAL/PLATELET
Basophils Absolute: 0 10*3/uL (ref 0.0–0.1)
Basophils Relative: 0 % (ref 0–1)
HCT: 45.6 % (ref 39.0–52.0)
Hemoglobin: 15.2 g/dL (ref 13.0–17.0)
Lymphocytes Relative: 12 % (ref 12–46)
MCHC: 33.3 g/dL (ref 30.0–36.0)
Monocytes Absolute: 0.5 10*3/uL (ref 0.1–1.0)
Monocytes Relative: 8 % (ref 3–12)
Neutro Abs: 4.8 10*3/uL (ref 1.7–7.7)
Neutrophils Relative %: 77 % (ref 43–77)
RDW: 15.2 % (ref 11.5–15.5)
WBC: 6.2 10*3/uL (ref 4.0–10.5)

## 2013-11-06 LAB — COMPREHENSIVE METABOLIC PANEL
AST: 32 U/L (ref 0–37)
Albumin: 3.9 g/dL (ref 3.5–5.2)
Alkaline Phosphatase: 112 U/L (ref 39–117)
CO2: 23 mEq/L (ref 19–32)
Chloride: 101 mEq/L (ref 96–112)
Creatinine, Ser: 1.54 mg/dL — ABNORMAL HIGH (ref 0.50–1.35)
GFR calc non Af Amer: 51 mL/min — ABNORMAL LOW (ref >90)
Potassium: 4.1 mEq/L (ref 3.5–5.1)
Total Bilirubin: 0.6 mg/dL (ref 0.3–1.2)

## 2013-11-06 LAB — POCT I-STAT TROPONIN I

## 2013-11-06 MED ORDER — HYDROMORPHONE HCL PF 1 MG/ML IJ SOLN
1.0000 mg | Freq: Once | INTRAMUSCULAR | Status: AC
  Administered 2013-11-06: 1 mg via INTRAVENOUS
  Filled 2013-11-06: qty 1

## 2013-11-06 MED ORDER — SODIUM CHLORIDE 0.9 % IV SOLN
1000.0000 mL | Freq: Once | INTRAVENOUS | Status: AC
  Administered 2013-11-06: 1000 mL via INTRAVENOUS

## 2013-11-06 MED ORDER — IOHEXOL 300 MG/ML  SOLN
25.0000 mL | INTRAMUSCULAR | Status: AC
  Administered 2013-11-06: 25 mL via ORAL

## 2013-11-06 MED ORDER — ONDANSETRON HCL 4 MG/2ML IJ SOLN
4.0000 mg | Freq: Once | INTRAMUSCULAR | Status: AC
  Administered 2013-11-06: 4 mg via INTRAVENOUS
  Filled 2013-11-06: qty 2

## 2013-11-06 MED ORDER — IOHEXOL 300 MG/ML  SOLN: 100.0000 mL | Freq: Once | INTRAMUSCULAR | Status: DC | PRN

## 2013-11-06 MED ORDER — SODIUM CHLORIDE 0.9 % IV SOLN
1000.0000 mL | INTRAVENOUS | Status: DC
  Administered 2013-11-06: 1000 mL via INTRAVENOUS

## 2013-11-06 MED ORDER — HYDROCODONE-ACETAMINOPHEN 5-325 MG PO TABS: 1.0000 | ORAL_TABLET | Freq: Four times a day (QID) | ORAL | Status: DC | PRN

## 2013-11-06 NOTE — ED Provider Notes (Signed)
CSN: [id number]     Arrival date & time 11/06/13  1533 History   First MD Initiated Contact with Patient 11/06/13 1708     Chief Complaint  Patient presents with  . Abdominal Pain  . Emesis  . Diarrhea   (Consider location/radiation/quality/duration/timing/severity/associated sxs/prior Treatment) HPI 52 year old male who presents today complaining of nausea vomiting and diarrhea. He states that he awoke this morning with nausea and has vomited 4 times today. He describes a brownish substance in his emesis. He complains of diffuse crampy abdominal pain. He states he has had multiple loose stools that were brown in nature. He denies having any previous similar symptoms. He did not take any of his medications today. He has noted some chills but has not had fever. He has not had anything by mouth today due to the nausea. He has had no known sick contacts.  Past Medical History  Diagnosis Date  . Diabetes mellitus   . Hypertension   . Drug abuse     cocaine, marijuana abuse  . Chest pain     due to cocaine use  . Renal insufficiency    Past Surgical History  Procedure Laterality Date  . Cardiac catheterization  05.31.2012    showing patent coronaries with no visualized vasospasm and EF of 55-60 %  . Orbital fracture surgery     History reviewed. No pertinent family history. History  Substance Use Topics  . Smoking status: Never Smoker   . Smokeless tobacco: Not on file  . Alcohol Use: Yes    Review of Systems  All other systems reviewed and are negative.    Allergies  Review of patient's allergies indicates no known allergies.  Home Medications   Current Outpatient Rx  Name  Route  Sig  Dispense  Refill  . amLODipine (NORVASC) 10 MG tablet   Oral   Take 10 mg by mouth daily.         . diazepam (VALIUM) 5 MG tablet   Oral   Take 1 tablet (5 mg total) by mouth 2 (two) times daily.   10 tablet   0   . insulin NPH (HUMULIN N,NOVOLIN N) 100 UNIT/ML injection  Subcutaneous   Inject 20 Units into the skin 2 (two) times daily.         Marland Kitchen lisinopril-hydrochlorothiazide (PRINZIDE,ZESTORETIC) 20-25 MG per tablet   Oral   Take 1 tablet by mouth daily.         Marland Kitchen omeprazole (PRILOSEC) 20 MG capsule   Oral   Take 20 mg by mouth daily. Take daily before a meal          BP 168/98  Pulse 95  Temp(Src) 98 F (36.7 C) (Oral)  Resp 22  Wt 226 lb 8 oz (102.74 kg)  SpO2 99% Physical Exam  Nursing note and vitals reviewed. Constitutional: He is oriented to person, place, and time. He appears well-developed and well-nourished.  HENT:  Head: Normocephalic and atraumatic.  Right Ear: External ear normal.  Left Ear: External ear normal.  Nose: Nose normal.  Mouth/Throat: Oropharynx is clear and moist.  Eyes: Conjunctivae and EOM are normal. Pupils are equal, round, and reactive to light.  Neck: Normal range of motion. Neck supple.  Cardiovascular: Normal rate, regular rhythm, normal heart sounds and intact distal pulses.   Pulmonary/Chest: Effort normal and breath sounds normal. No respiratory distress. He has no wheezes. He exhibits no tenderness.  Abdominal: Soft. Bowel sounds are normal. He exhibits no distension and  no mass. There is no tenderness. There is no guarding.  Mild diffuse tenderness to palpation with more tenderness palpation in the right lower quadrant.  Musculoskeletal: Normal range of motion.  Neurological: He is alert and oriented to person, place, and time. He has normal reflexes. He exhibits normal muscle tone. Coordination normal.  Skin: Skin is warm and dry.  Psychiatric: He has a normal mood and affect. His behavior is normal. Judgment and thought content normal.    ED Course  Procedures (including critical care time) Labs Review Labs Reviewed  COMPREHENSIVE METABOLIC PANEL - Abnormal; Notable for the following:    Glucose, Bld 134 (*)    Creatinine, Ser 1.54 (*)    GFR calc non Af Amer 51 (*)    GFR calc Af Amer 59  (*)    All other components within normal limits  GLUCOSE, CAPILLARY - Abnormal; Notable for the following:    Glucose-Capillary 126 (*)    All other components within normal limits  CBC WITH DIFFERENTIAL  LIPASE, BLOOD  URINALYSIS, ROUTINE W REFLEX MICROSCOPIC  OCCULT BLOOD X 1 CARD TO LAB, STOOL  POCT I-STAT TROPONIN I   Imaging Review Dg Chest Port 1 View  11/06/2013   CLINICAL DATA:  Short of breath. Chest pain. Diabetes. Hypertension.  EXAM: PORTABLE CHEST - 1 VIEW  COMPARISON:  05/11/2011  FINDINGS: Midline trachea. Normal heart size. Mild left hemidiaphragm elevation. No pleural effusion or pneumothorax. No congestive failure. Clear lungs.  IMPRESSION: No acute cardiopulmonary disease.   Electronically Signed   By: Jeronimo Greaves M.D.   On: 11/06/2013 17:48    EKG Interpretation   None       MDM  No diagnosis found. 52 y.o. Male with iddm presents with nausea, vomiting, and diarrhea.  Abdomen is mildly diffusely ttp.  Patient with normal labs and nonspecific exam.  A ct of the abdomen is pending.  Patient care is discussed with Earley Favor, PA C and she will disposition the patient after ct is resulted.  HE appears to be stable for discharge if no acute intraabdominal process present.    Hilario Quarry, MD 11/06/13 2003

## 2013-11-06 NOTE — ED Notes (Signed)
Pt c/o abd pain into chest with N/V/D starting today

## 2013-11-06 NOTE — Progress Notes (Signed)
ED CM noted patient to be with PCP or Health Insurance. Pt presented to ED with complaints of n/v. Pt receives medical care at the [facility]. Pt states, that he is homeless. Pt given [employer]. Pt encouraged to follow up with Bonita Quin at [facility]. Pt verbalizes understanding and agrees with plan. No further CM needs identified

## 2013-11-06 NOTE — ED Provider Notes (Signed)
Patient's CT scan has been reviewed.  There are no abnormal findings.  Patient has been discharged home with instructions on nausea, vomiting, and diarrhea.  He's been given a prescription for 6 Vicodin to use for any additional discomfort.  He may be having an instructions to followup with his primary care physician and monitor his blood sugars  Arman Filter, NP 11/06/13 2146

## 2013-11-11 NOTE — ED Provider Notes (Signed)
History/physical exam/procedure(s) were performed by non-physician practitioner and as supervising physician I was immediately available for consultation/collaboration. I have reviewed all notes and am in agreement with care and plan.   Ronie Fleeger S Krissie Merrick, MD 11/11/13 1522 

## 2014-04-17 ENCOUNTER — Emergency Department (HOSPITAL_COMMUNITY): Payer: Self-pay

## 2014-04-17 ENCOUNTER — Encounter (HOSPITAL_COMMUNITY): Payer: Self-pay | Admitting: Emergency Medicine

## 2014-04-17 ENCOUNTER — Emergency Department (HOSPITAL_COMMUNITY)
Admission: EM | Admit: 2014-04-17 | Discharge: 2014-04-17 | Disposition: A | Discharge status: Home / Self Care | Payer: Self-pay | Attending: Emergency Medicine | Admitting: Emergency Medicine

## 2014-04-17 DIAGNOSIS — W010XXA Fall on same level from slipping, tripping and stumbling without subsequent striking against object, initial encounter: Secondary | ICD-10-CM | POA: Insufficient documentation

## 2014-04-17 DIAGNOSIS — Z79899 Other long term (current) drug therapy: Secondary | ICD-10-CM | POA: Insufficient documentation

## 2014-04-17 DIAGNOSIS — S93499A Sprain of other ligament of unspecified ankle, initial encounter: Secondary | ICD-10-CM | POA: Insufficient documentation

## 2014-04-17 DIAGNOSIS — Z791 Long term (current) use of non-steroidal anti-inflammatories (NSAID): Secondary | ICD-10-CM | POA: Insufficient documentation

## 2014-04-17 DIAGNOSIS — S86001A Unspecified injury of right Achilles tendon, initial encounter: Secondary | ICD-10-CM

## 2014-04-17 DIAGNOSIS — Z794 Long term (current) use of insulin: Secondary | ICD-10-CM | POA: Insufficient documentation

## 2014-04-17 DIAGNOSIS — I10 Essential (primary) hypertension: Secondary | ICD-10-CM | POA: Insufficient documentation

## 2014-04-17 DIAGNOSIS — Y92009 Unspecified place in unspecified non-institutional (private) residence as the place of occurrence of the external cause: Secondary | ICD-10-CM | POA: Insufficient documentation

## 2014-04-17 DIAGNOSIS — S96819A Strain of other specified muscles and tendons at ankle and foot level, unspecified foot, initial encounter: Principal | ICD-10-CM

## 2014-04-17 DIAGNOSIS — E119 Type 2 diabetes mellitus without complications: Secondary | ICD-10-CM | POA: Insufficient documentation

## 2014-04-17 DIAGNOSIS — Y9389 Activity, other specified: Secondary | ICD-10-CM | POA: Insufficient documentation

## 2014-04-17 HISTORY — DX: Type 2 diabetes mellitus without complications: E11.9

## 2014-04-17 MED ORDER — OXYCODONE-ACETAMINOPHEN 5-325 MG PO TABS
2.0000 | ORAL_TABLET | Freq: Once | ORAL | Status: AC
  Administered 2014-04-17: 2 via ORAL
  Filled 2014-04-17: qty 2

## 2014-04-17 MED ORDER — OXYCODONE-ACETAMINOPHEN 5-325 MG PO TABS: 1.0000 | ORAL_TABLET | ORAL | Status: DC | PRN

## 2014-04-17 NOTE — ED Notes (Signed)
Pt is still out of the department in MRI at this time; family waiting in room

## 2014-04-17 NOTE — ED Notes (Signed)
Ortho Tech Paged 

## 2014-04-17 NOTE — ED Provider Notes (Signed)
CSN: [id number]     Arrival date & time 04/17/14  0502 History   First MD Initiated Contact with Patient 04/17/14 0507     Chief Complaint  Patient presents with  . Ankle Pain  . Fall     (Consider location/radiation/quality/duration/timing/severity/associated sxs/prior Treatment) HPI History per patient. At home tonight and stepped off of his porch injuring his right ankle. Complains of pain and swelling to posterior ankle/ distal calf. Denies hearing a pop. He denies any other pain or injury. No weakness or numbness. Per EMS was ambulatory on scene. Pain sharp in quality and moderate in severity.  Past Medical History  Diagnosis Date  . Hypertension   . Diabetes mellitus without complication    History reviewed. No pertinent past surgical history. No family history on file. History  Substance Use Topics  . Smoking status: Never Smoker   . Smokeless tobacco: Not on file  . Alcohol Use: Yes     Comment: occassional    Review of Systems  Constitutional: Negative for fever and chills.  Eyes: Negative for visual disturbance.  Respiratory: Negative for shortness of breath.   Cardiovascular: Negative for chest pain.  Gastrointestinal: Negative for abdominal pain.  Genitourinary: Negative for flank pain.  Musculoskeletal: Negative for back pain, neck pain and neck stiffness.  Skin: Negative for rash.  Neurological: Negative for weakness and numbness.  All other systems reviewed and are negative.     Allergies  Review of patient's allergies indicates no known allergies.  Home Medications   Prior to Admission medications   Medication Sig Start Date End Date Taking? Authorizing Provider  amLODipine (NORVASC) 10 MG tablet Take 10 mg by mouth daily.   Yes Historical Provider, MD  carvedilol (COREG) 6.25 MG tablet Take 6.25 mg by mouth 2 (two) times daily with a meal.   Yes Historical Provider, MD  hydrOXYzine (ATARAX/VISTARIL) 25 MG tablet Take 25 mg by mouth at bedtime.    Yes Historical Provider, MD  insulin NPH Human (HUMULIN N,NOVOLIN N) 100 UNIT/ML injection Inject 20 Units into the skin 2 (two) times daily before a meal.   Yes Historical Provider, MD  ketoconazole (NIZORAL) 2 % cream Apply 1 application topically daily.   Yes Historical Provider, MD  lisinopril-hydrochlorothiazide (PRINZIDE,ZESTORETIC) 20-25 MG per tablet Take 1 tablet by mouth daily.   Yes Historical Provider, MD  methocarbamol (ROBAXIN) 500 MG tablet Take 500 mg by mouth every 6 (six) hours as needed for muscle spasms.   Yes Historical Provider, MD  naproxen (NAPROSYN) 500 MG tablet Take 500 mg by mouth 2 (two) times daily with a meal.   Yes Historical Provider, MD  omeprazole (PRILOSEC) 20 MG capsule Take 20 mg by mouth daily.   Yes Historical Provider, MD   BP 121/87  Pulse 90  Temp(Src) 99 F (37.2 C) (Oral)  Resp 18  SpO2 97% Physical Exam  Constitutional: He is oriented to person, place, and time. He appears well-developed and well-nourished.  HENT:  Head: Normocephalic and atraumatic.  Eyes: EOM are normal. Pupils are equal, round, and reactive to light.  Neck: Neck supple.  Cardiovascular: Normal rate, regular rhythm and intact distal pulses.   Pulmonary/Chest: Effort normal and breath sounds normal. No respiratory distress.  Abdominal: Soft. He exhibits no distension. There is no tenderness.  Musculoskeletal:  Right lower extremity with tenderness and swelling to posterior ankle. Skin intact throughout. No lacerations or open wound. No tenderness over the foot with distal neurovascular intact. No tenderness over the  proximal fibula.  Neurological: He is alert and oriented to person, place, and time.  Skin: Skin is warm and dry.    ED Course  Procedures (including critical care time) Labs Review Labs Reviewed - No data to display  Imaging Review Dg Ankle Complete Right  04/17/2014   CLINICAL DATA:  Posterior ankle pain after twisting injury  EXAM: RIGHT ANKLE - COMPLETE  3+ VIEW  COMPARISON:  None.  FINDINGS: Subcutaneous fat infiltration ventral to the Achilles tendon, which is indistinct. There is ossification or calcification overlapping the upper heel, which could be tendinous or cutaneous. If tendinous, this would suggest chronic tendinopathy. Plantar and dorsal calcaneal enthesophyte. No acute fracture or malalignment.  IMPRESSION: 1. No acute osseous abnormality. 2. Edema around the Achilles which may reflect tendon injury.   Electronically Signed   By: Tiburcio PeaJonathan  Watts M.D.   On: 04/17/2014 05:46   Percocet provided. Ice and elevation.  7:16 AM discuss with orthopedics on-call, Dr. Jillyn HiddenBean who recommends MRI now, Cam Walker and followup in clinic.  Will plan discharge home with prescription for pain medication and outpatient referral. Patient states understanding need for close outpatient followup and he will call the office today.  MDM   Diagnosis: Ruptured Achilles tendon  53 year old insulin-dependent diabetic male, stepped off of his porch today and injured his right lower extremity. Evaluated with x-rays above and also has exam findings concerning for Achilles tendon injury. Case was consulted and Dr. Jillyn HiddenBean recommends MRI which was ordered, Verdene Rioam Walker and outpatient followup.    Sunnie NielsenBrian Jaesean Litzau, MD 04/17/14 [phone]

## 2014-04-17 NOTE — ED Notes (Signed)
Patient transported to MRI 

## 2014-04-17 NOTE — ED Notes (Signed)
PER EMS: pt from home, reports he slipped off of his porch this morning and reports right ankle pain. Back of ankle appears swollen, pedal pulse strong and palpable, sensory intact and able to wiggle toes. No deformity noted. Pain described 10/10 sharp and intermediate ankle pain radiating to right leg. Denies hitting head when he fell. Pt A&OX4, ambulatory on scene per EMS. Denies any other complaints at this moment.

## 2014-04-17 NOTE — Progress Notes (Signed)
Orthopedic Tech Progress Note Patient Details:  Rubbie Battiestnthony R Steeple March 29, 1961 [id number]  Patient is fitted with a cam walker and crutches. The wear and care are gone verbally regarding cam walker. Patient states that he has used crutches before and is knowledgeable in use  Ortho Devices Type of Ortho Device: CAM walker;Crutches Ortho Device/Splint Location: right lower extremity Ortho Device/Splint Interventions: Application   Early CharsWilliam Dvonte Katelen Luepke 04/17/2014, 1:02 PM

## 2014-04-17 NOTE — Discharge Instructions (Signed)
Complete Achilles Tendon Rupture  An Achilles tendon rupture is an injury in which the tough, cord-like band that attaches the lower muscles of your leg to your heel (Achilles tendon) tears (ruptures). In a complete Achilles tendon rupture, you are not able to stand up on the toes of the injured leg.  CAUSES  A tendon may rupture if it is weakened or weakening (degenerative) and a sudden stress is applied to it. Weakening or degeneration of a tendon may be caused by:  · Recurrent injuries, such as those causing Achilles tendinitis.  · Damaged tendons.  · Aging.  · Vascular disease of the tendon.  SIGNS AND SYMPTOMS  · Feeling as if you were struck violently in the back of the ankle.  · Hearing a "pop" and experiencing severe, sudden (acute) pain (however, the absence of pain does not mean there was not a rupture).  DIAGNOSIS  A physical exam is usually all that is needed to diagnose an Achilles tendon rupture. During the exam, your health care provider will touch the tendon and the structures around it. You may be asked to lie on your stomach or kneel on a chair while your health care provider squeezes your calf muscle. You most likely have a ruptured tendon if your foot does not flex.   Sometimes tests are performed. These may include:  · An ultrasound. This allows quick confirmation of the diagnosis.  · An X-ray.  · An MRI.  TREATMENT   A complete Achilles tendon rupture is treated with surgery. You will have a cast while the injury heals (this usually takes 6 10 weeks). Before your surgery, treatment may consist of:  · Ice applied to the area.  · Pain-relieving medicines.  · Rest.  · Crutches.  · Keeping the ankle from moving (immobilization ), usually with a splint.  HOME CARE INSTRUCTIONS   · Apply ice to the injured area:    · Put ice in a plastic bag.    · Place a towel between your skin and the bag.    · Leave the ice on for 20 minutes, 2 3 times a day.    · Use crutches and move about only as instructed.     · Keep the leg elevated above the level of the heart (the center of the chest) at all times when not using the bathroom. Do not dangle the leg over a chair, couch, or bed. When lying down, elevate your leg on a few pillows. Elevation prevents swelling and reduces pain.    · Avoid use of the injured area other than gentle range of motion of the toes.    · Do not drive a car until your health care provider specifically tells you it is safe to do so.    · Only take over-the-counter or prescription medicines for pain, discomfort, or fever as directed by your health care provider.  · Keep all follow-up appointments with your health care provider.  SEEK MEDICAL CARE IF:   · Your pain and swelling increase, or your pain is not controlled by medicines.    · You have new, unexplained symptoms, or your symptoms get worse.    · You cannot move your toes or foot.  · You develop warmth and swelling in your foot.  · You have an unexplained fever.  MAKE SURE YOU:   · Understand these instructions.  · Will watch your condition.  · Will get help right away if you are not doing well or get worse.  Document Released: 09/08/2005 Document Revised:   09/19/2013 Document Reviewed: 07/20/2013  [employer]® Patient Information ©2014 [employer].

## 2014-04-17 NOTE — Progress Notes (Signed)
Orthopedic Tech Progress Note Patient Details:  Ryan [name] 11-24-1961 [id number]  Ortho Devices Type of Ortho Device: CAM walker;Crutches Ortho Device/Splint Interventions: Application   [name] [name] 04/17/2014, 7:26 AM

## 2014-04-26 ENCOUNTER — Other Ambulatory Visit: Payer: Self-pay | Admitting: Orthopedic Surgery

## 2014-04-26 NOTE — Patient Instructions (Addendum)
20 Cecil Cobbsnthony Kozel  04/26/2014   Your procedure is scheduled on: Thursday May 02, 2014  [facility] and follow signs to  Short Stay Center at 530 AM.  Call this number if you have problems the morning of surgery [phone]   Remember:  Do not eat food or drink liquids :After Midnight.     Take these medicines the morning of surgery with A SIP OF WATER: amlodipine ( norvasc), carvedilol ( coreg), omeprazole (prilosec), take 1/2 dose of NPH insulin on 05-01-14, percocet if needed                               You may not have any metal on your body including hair pins and piercings  Do not wear jewelry, make-up, lotions, powders, or deodorant.   Men may shave face and neck.  Do not bring valuables to the hospital. [facility] IS NOT RESPONSIBLE FOR VALUABLES.  Contacts, dentures or bridgework may not be worn into surgery.  Leave suitcase in the car. After surgery it may be brought to your room.  For patients admitted to the hospital, checkout time is 11:00 AM the day of discharge.   Patients discharged the day of surgery will not be allowed to drive home.  Name and phone number of your driver:  Special Instructions: N/A  ________________________________________________________________________  [facility] Pre[facility] Before surgery, you can play an important role.  Because skin is not sterile, your skin needs to be as free of germs as possible.  You can reduce the number of germs on your skin by washing with CHG (chlorahexidine gluconate) soap before surgery.  CHG is an antiseptic cleaner which kills germs and bonds with the skin to continue killing germs even after washing. Please DO NOT use if you have an allergy to CHG or antibacterial soaps.  If your skin becomes reddened/irritated stop using the CHG and inform your nurse when you arrive at Short Stay. Do not shave (including legs and underarms) for at least 48 hours prior to the first CHG  shower.  You may shave your face. Please follow these instructions carefully:  1.  Shower with CHG Soap the night before surgery and the  morning of Surgery.  2.  If you choose to wash your hair, wash your hair first as usual with your  normal  shampoo.  3.  After you shampoo, rinse your hair and body thoroughly to remove the  shampoo.                           4.  Use CHG as you would any other liquid soap.  You can apply chg directly  to the skin and wash                       Gently with a scrungie or clean washcloth.  5.  Apply the CHG Soap to your body ONLY FROM THE NECK DOWN.   Do not use on open                           Wound or open sores. Avoid contact with eyes, ears mouth and genitals (private parts).  Genitals (private parts) with your normal soap.             6.  Wash thoroughly, paying special attention to the area where your surgery  will be performed.  7.  Thoroughly rinse your body with warm water from the neck down.  8.  DO NOT shower/wash with your normal soap after using and rinsing off  the CHG Soap.                9.  Pat yourself dry with a clean towel.            10.  Wear clean pajamas.            11.  Place clean sheets on your bed the night of your first shower and do not  sleep with pets. Day of Surgery : Do not apply any lotions/deodorants the morning of surgery.  Please wear clean clothes to the hospital/surgery center.  FAILURE TO FOLLOW THESE INSTRUCTIONS MAY RESULT IN THE CANCELLATION OF YOUR SURGERY PATIENT SIGNATURE_________________________________  NURSE SIGNATURE__________________________________  ________________________________________________________________________

## 2014-04-26 NOTE — Progress Notes (Signed)
need orders put in EPIC please - pt coming for Preop MON 04/29/14 - thank you

## 2014-04-29 ENCOUNTER — Encounter (HOSPITAL_COMMUNITY): Payer: Self-pay | Admitting: Pharmacy Technician

## 2014-04-29 ENCOUNTER — Encounter (HOSPITAL_COMMUNITY): Payer: Self-pay

## 2014-04-29 ENCOUNTER — Encounter (HOSPITAL_COMMUNITY)
Admission: RE | Admit: 2014-04-29 | Discharge: 2014-04-29 | Disposition: A | Discharge status: Home / Self Care | Payer: Self-pay | Source: Ambulatory Visit | Attending: Specialist | Admitting: Specialist

## 2014-04-29 ENCOUNTER — Encounter (INDEPENDENT_AMBULATORY_CARE_PROVIDER_SITE_OTHER): Payer: Self-pay

## 2014-04-29 DIAGNOSIS — Z01812 Encounter for preprocedural laboratory examination: Secondary | ICD-10-CM | POA: Insufficient documentation

## 2014-04-29 DIAGNOSIS — Z01810 Encounter for preprocedural cardiovascular examination: Secondary | ICD-10-CM | POA: Insufficient documentation

## 2014-04-29 HISTORY — DX: Strain of unspecified achilles tendon, initial encounter: S86.019A

## 2014-04-29 HISTORY — DX: Gastro-esophageal reflux disease without esophagitis: K21.9

## 2014-04-29 HISTORY — DX: Unspecified osteoarthritis, unspecified site: M19.90

## 2014-04-29 LAB — BASIC METABOLIC PANEL
BUN: 23 mg/dL (ref 6–23)
CHLORIDE: 100 meq/L (ref 96–112)
CO2: 22 meq/L (ref 19–32)
Calcium: 9.1 mg/dL (ref 8.4–10.5)
Creatinine, Ser: 1.68 mg/dL — ABNORMAL HIGH (ref 0.50–1.35)
GFR calc non Af Amer: 46 mL/min — ABNORMAL LOW (ref >90)
GFR, EST AFRICAN AMERICAN: 53 mL/min — AB (ref >90)
Glucose, Bld: 190 mg/dL — ABNORMAL HIGH (ref 70–99)
POTASSIUM: 4 meq/L (ref 3.7–5.3)
SODIUM: 137 meq/L (ref 137–147)

## 2014-04-29 LAB — CBC
HEMATOCRIT: 40.9 % (ref 39.0–52.0)
HEMOGLOBIN: 13.7 g/dL (ref 13.0–17.0)
MCH: 26.7 pg (ref 26.0–34.0)
MCHC: 33.5 g/dL (ref 30.0–36.0)
MCV: 79.7 fL (ref 78.0–100.0)
Platelets: 223 10*3/uL (ref 150–400)
RBC: 5.13 MIL/uL (ref 4.22–5.81)
RDW: 18 % — ABNORMAL HIGH (ref 11.5–15.5)
WBC: 7.3 10*3/uL (ref 4.0–10.5)

## 2014-04-29 NOTE — Progress Notes (Signed)
Spoke with dr Acey Lavcarignan aware of patient history and 04-29-14 ekg results pt ok for surgery

## 2014-04-29 NOTE — Progress Notes (Signed)
Repeated ekg  Today with pre op due to 11-06-13 ekg epic abnormal

## 2014-04-29 NOTE — Progress Notes (Signed)
bmet results faxed to dr beane by epic

## 2014-05-01 ENCOUNTER — Other Ambulatory Visit: Payer: Self-pay | Admitting: Orthopedic Surgery

## 2014-05-01 NOTE — H&P (Signed)
Ryan [name] DOB: [birth year]  Chief Complaint: R posterior ankle pain  History of Present Illness The patient is a 53 year old male who presents to the practice today for a transition into care. The patient is transitioning into care from emergency room and a summary of care was reviewed .  Ankle painis described as the following: The patient reports right Achilles' tendon symptoms which began 39 day(s) ago following a specific injury. Symptoms are reported to be located in the right posterior ankle and include ankle pain (and muscle spasms), difficulty bearing weight and audible pop at the time of injury. Current treatment includes crutches (partial weight bearing) (with a CAM boot). Prior to being seen today the patient was previously evaluated in the emergency room. Previous work-up for this problem has included ankle x-rays and ankle MRI. Past treatment for this problem has included crutches (partial weight bearing) (CAM boot) and opioid analgesics.  Ryan [name] comes in today for eval of his R ankle. He was stepping off a curb 9 days ago when he felt and heard a sudden pop and pain. He presented to the ER where he had an xray and MRI confirming achilles tear. He was placed in a CAM walker and is using crutches, partially weightbearing. He has been out of work- floor buffering. He ran out of Percocet but it had been helping with the pain. He has been icing and elevating as well. Denies hx of DVT or MRSA. He is an insulin dependent diabetic, reports sugars in the low 100's, well controlled.  Allergies No Known Drug Allergies. 04/26/2014  Family History Hypertension. Mother.  Social History Children. 0 Current drinker. 04/26/2014: Currently drinks beer only occasionally per week Current work status. working part time Exercise. Exercises rarely; does running / walking Living situation. live with parents Marital status. single No history of drug/alcohol rehab Not under  pain contract Tobacco / smoke exposure. 04/26/2014: no Tobacco use. Never smoker. 04/26/2014  Medication History insulin Active. anti-reflux medcation Active. antihypertensive medication Active. Medications Reconciled.  Past Surgical History No pertinent past surgical history   Past Medical Hx Diabetes Mellitus, Type I High blood pressure  Review of Systems General:Not Present- Chills, Fever, Night Sweats, Appetite Loss, Fatigue, Feeling sick, Weight Gain and Weight Loss. HEENT:Not Present- Sensitivity to light, Hearing problems, Nose Bleed and Ringing in the Ears. Respiratory:Not Present- Snoring, Chronic Cough, Bloody sputum and Dyspnea. Cardiovascular:Not Present- Shortness of Breath, Chest Pain, Swelling of Extremities, Leg Cramps and Palpitations. Gastrointestinal:Not Present- Bloody Stool, Heartburn, Abdominal Pain, Vomiting, Nausea and Incontinence of Stool. Male Genitourinary:Not Present- Blood in Urine, Frequency, Incontinence and Nocturia. Musculoskeletal:Present- Muscle Pain, Joint Stiffness, Joint Swelling and Joint Pain.  Vitals 04/26/2014 12:35 PM Weight: 240 lb Height: 69 in Body Surface Area: 2.3 m Body Mass Index: 35.44 kg/m BP: 157/86 (Sitting, Right Arm, Standard)  Physical Exam The physical exam findings are as follows:  General Mental Status - Alert. General Appearance- pleasant and In acute distress (mild). Orientation- Oriented X3. Build & Nutrition- Well nourished. Gait- Use of assistive device (crutches). Mental Status- Alert.  Peripheral Vascular Lower Extremity: Palpation:Tenderness- Right- no calf tenderness to palpation. Posterior tibial pulse- Bilateral- 2+. Dorsalis pedis pulse- Bilateral- 2+.  Neurologic Sensation:Lower Extremity- Right- sensation is intact in the lower extremity, unless otherwise mentioned.  Musculoskeletal Lower Extremity Right Lower Extremity: Right Ankle: Inspection  and Palpation:Clinical Contours- loss of normal clinical contours. Tenderness- Achilles tendon tender to palpation and Achilles tendon insertion tender to palpation. no tenderness to palpation of the  base of the 5th metatarsal and no tenderness to palpation of the calf. Swelling- moderate. Effusion- none. Tissue tension/texture is - soft. Sensation is - normal. Achilles:Tenderness- distal Achilles tendon tender to palpation and insertional Achilles tendon tender to palpation. Warmth- no warmth with palpation of the Achilles tendon. Defect- palpable defect present. Strength and Tone:Plantarflexion- 5/5. Dorsiflexion- 5/5. Inversion- 5/5. Eversion- 5/5. ROM:Testing limited- due to pain. ZOX:WRUEAVWSpecial Tests- Thompson's test positive. Right Foot: Inspection and Palpation:Tenderness- no tenderness to palpation. Swelling- none.  Imaging xrays and MRI images and report R ankle reviewed by Dr. Shelle IronBeane with no fx, subluxation, dislocation, lytic or blastic lesions. Osteophytes noted at the plantar and posterior calcaneus. MRI + for achilles rupture distal aspect from the calcaneal insertion with approx 2cm of retraction.  Assessment & Plan Right achilles tendon rupture  Pt with R achilles tendon rupture 9 days s/p injury, no prior hx of achilles/ankle pain or injury. We discussed the nature of his injury and the need for surgical repair. Discussed the procedure itself, repair R achilles tendon rupture, as well as risks, complications, and alternatives including but not limited to DVT, PE, infx, bleeding, failure of procedure, need for secondary procedure, anesthesia risk, even death. Discussed post-op protocols, casting in plantar flexion, need for DVT ppx, time out of work, need for PT post-operatively. He will likely require an overnight hospital stay. In the interim, he will remain in his CAM walker, crutches to assist with weightbearing. I did also give him a script for a kneeling rolling  scooter to help him post-operatively. Note given to remain out of work. New Rx for Percocet given for pain. Plan ASA for DVT ppx post-op. Continue ice and elevation to reduce swelling pre-op as much as possible. He will follow up 10-14 days post-op for staple removal and recasting and will call with any questions or concerns in the interim. Plan to schedule surgery for next week.  Plan R achilles tendon repair  Signed electronically by Andrez GrimeJaclyn Bissell PA-C for Dr. Shelle IronBeane

## 2014-05-01 NOTE — Anesthesia Preprocedure Evaluation (Addendum)
Anesthesia Evaluation  Patient identified by MRN, date of birth, ID band Patient awake    Reviewed: Allergy & Precautions, H&P , NPO status , Patient's Chart, lab work & pertinent test results  Airway Mallampati: II TM Distance: >3 FB Neck ROM: Full    Dental no notable dental hx.    Pulmonary neg pulmonary ROS,  breath sounds clear to auscultation  Pulmonary exam normal       Cardiovascular hypertension, Pt. on medications Rhythm:Regular Rate:Normal     Neuro/Psych negative neurological ROS  negative psych ROS   GI/Hepatic Neg liver ROS, GERD-  Medicated,  Endo/Other  negative endocrine ROSdiabetes  Renal/GU negative Renal ROS  negative genitourinary   Musculoskeletal negative musculoskeletal ROS (+)   Abdominal   Peds negative pediatric ROS (+)  Hematology negative hematology ROS (+)   Anesthesia Other Findings   Reproductive/Obstetrics negative OB ROS                         Anesthesia Physical Anesthesia Plan  ASA: II  Anesthesia Plan: General   Post-op Pain Management:    Induction: Intravenous  Airway Management Planned: Oral ETT  Additional Equipment:   Intra-op Plan:   Post-operative Plan: Extubation in OR  Informed Consent: I have reviewed the patients History and Physical, chart, labs and discussed the procedure including the risks, benefits and alternatives for the proposed anesthesia with the patient or authorized representative who has indicated his/her understanding and acceptance.   Dental advisory given  Plan Discussed with: CRNA and Surgeon  Anesthesia Plan Comments:         Anesthesia Quick Evaluation

## 2014-05-02 ENCOUNTER — Encounter (HOSPITAL_COMMUNITY): Payer: Self-pay | Admitting: Anesthesiology

## 2014-05-02 ENCOUNTER — Encounter (HOSPITAL_COMMUNITY): Payer: Self-pay | Admitting: *Deleted

## 2014-05-02 ENCOUNTER — Ambulatory Visit (HOSPITAL_COMMUNITY)
Admission: RE | Admit: 2014-05-02 | Discharge: 2014-05-03 | Disposition: A | Discharge status: Home / Self Care | Payer: Self-pay | Source: Ambulatory Visit | Attending: Specialist | Admitting: Specialist

## 2014-05-02 ENCOUNTER — Ambulatory Visit (HOSPITAL_COMMUNITY): Payer: Self-pay | Admitting: Anesthesiology

## 2014-05-02 ENCOUNTER — Encounter (HOSPITAL_COMMUNITY)
Admission: RE | Disposition: A | Discharge status: Home / Self Care | Payer: Self-pay | Source: Ambulatory Visit | Attending: Specialist

## 2014-05-02 DIAGNOSIS — S86011A Strain of right Achilles tendon, initial encounter: Secondary | ICD-10-CM

## 2014-05-02 DIAGNOSIS — F1210 Cannabis abuse, uncomplicated: Secondary | ICD-10-CM | POA: Insufficient documentation

## 2014-05-02 DIAGNOSIS — S93499A Sprain of other ligament of unspecified ankle, initial encounter: Secondary | ICD-10-CM | POA: Insufficient documentation

## 2014-05-02 DIAGNOSIS — M898X9 Other specified disorders of bone, unspecified site: Secondary | ICD-10-CM | POA: Insufficient documentation

## 2014-05-02 DIAGNOSIS — S86019A Strain of unspecified Achilles tendon, initial encounter: Secondary | ICD-10-CM | POA: Diagnosis present

## 2014-05-02 DIAGNOSIS — Z79899 Other long term (current) drug therapy: Secondary | ICD-10-CM | POA: Insufficient documentation

## 2014-05-02 DIAGNOSIS — K219 Gastro-esophageal reflux disease without esophagitis: Secondary | ICD-10-CM | POA: Insufficient documentation

## 2014-05-02 DIAGNOSIS — Z794 Long term (current) use of insulin: Secondary | ICD-10-CM | POA: Insufficient documentation

## 2014-05-02 DIAGNOSIS — F1410 Cocaine abuse, uncomplicated: Secondary | ICD-10-CM | POA: Insufficient documentation

## 2014-05-02 DIAGNOSIS — S96819A Strain of other specified muscles and tendons at ankle and foot level, unspecified foot, initial encounter: Principal | ICD-10-CM

## 2014-05-02 DIAGNOSIS — I10 Essential (primary) hypertension: Secondary | ICD-10-CM | POA: Insufficient documentation

## 2014-05-02 DIAGNOSIS — W101XXA Fall (on)(from) sidewalk curb, initial encounter: Secondary | ICD-10-CM | POA: Insufficient documentation

## 2014-05-02 DIAGNOSIS — E119 Type 2 diabetes mellitus without complications: Secondary | ICD-10-CM | POA: Insufficient documentation

## 2014-05-02 HISTORY — PX: ACHILLES TENDON SURGERY: SHX542

## 2014-05-02 LAB — CBC
HCT: 37.8 % — ABNORMAL LOW (ref 39.0–52.0)
Hemoglobin: 12.8 g/dL — ABNORMAL LOW (ref 13.0–17.0)
MCH: 27.1 pg (ref 26.0–34.0)
MCHC: 33.9 g/dL (ref 30.0–36.0)
MCV: 80.1 fL (ref 78.0–100.0)
PLATELETS: 211 10*3/uL (ref 150–400)
RBC: 4.72 MIL/uL (ref 4.22–5.81)
RDW: 17.5 % — ABNORMAL HIGH (ref 11.5–15.5)
WBC: 6.9 10*3/uL (ref 4.0–10.5)

## 2014-05-02 LAB — GLUCOSE, CAPILLARY
Glucose-Capillary: 134 mg/dL — ABNORMAL HIGH (ref 70–99)
Glucose-Capillary: 136 mg/dL — ABNORMAL HIGH (ref 70–99)
Glucose-Capillary: 162 mg/dL — ABNORMAL HIGH (ref 70–99)
Glucose-Capillary: 104 mg/dL — ABNORMAL HIGH (ref 70–99)
Glucose-Capillary: 208 mg/dL — ABNORMAL HIGH (ref 70–99)

## 2014-05-02 LAB — CREATININE, SERUM
CREATININE: 1.85 mg/dL — AB (ref 0.50–1.35)
GFR calc non Af Amer: 41 mL/min — ABNORMAL LOW (ref >90)
GFR, EST AFRICAN AMERICAN: 47 mL/min — AB (ref >90)

## 2014-05-02 SURGERY — REPAIR, TENDON, ACHILLES
Anesthesia: General | Site: Ankle | Laterality: Right

## 2014-05-02 MED ORDER — NEOSTIGMINE METHYLSULFATE 10 MG/10ML IV SOLN
INTRAVENOUS | Status: AC
  Filled 2014-05-02: qty 1

## 2014-05-02 MED ORDER — METHOCARBAMOL 1000 MG/10ML IJ SOLN
500.0000 mg | Freq: Three times a day (TID) | INTRAVENOUS | Status: DC
  Administered 2014-05-02: 500 mg via INTRAVENOUS
  Filled 2014-05-02 (×7): qty 5

## 2014-05-02 MED ORDER — MIDAZOLAM HCL 5 MG/5ML IJ SOLN
INTRAMUSCULAR | Status: DC | PRN
  Administered 2014-05-02: 2 mg via INTRAVENOUS

## 2014-05-02 MED ORDER — FENTANYL CITRATE 0.05 MG/ML IJ SOLN
INTRAMUSCULAR | Status: DC | PRN
  Administered 2014-05-02 (×2): 50 ug via INTRAVENOUS
  Administered 2014-05-02 (×2): 25 ug via INTRAVENOUS
  Administered 2014-05-02: 100 ug via INTRAVENOUS

## 2014-05-02 MED ORDER — HYDROCODONE-ACETAMINOPHEN 5-325 MG PO TABS: 1.0000 | ORAL_TABLET | ORAL | Status: DC | PRN

## 2014-05-02 MED ORDER — LIDOCAINE HCL (CARDIAC) 20 MG/ML IV SOLN
INTRAVENOUS | Status: AC
  Filled 2014-05-02: qty 5

## 2014-05-02 MED ORDER — METOCLOPRAMIDE HCL 10 MG PO TABS
5.0000 mg | ORAL_TABLET | Freq: Three times a day (TID) | ORAL | Status: DC | PRN
  Administered 2014-05-03: 10 mg via ORAL
  Filled 2014-05-02: qty 1

## 2014-05-02 MED ORDER — ROCURONIUM BROMIDE 100 MG/10ML IV SOLN
INTRAVENOUS | Status: DC | PRN
  Administered 2014-05-02: 50 mg via INTRAVENOUS

## 2014-05-02 MED ORDER — FENTANYL CITRATE 0.05 MG/ML IJ SOLN
INTRAMUSCULAR | Status: AC
  Filled 2014-05-02: qty 5

## 2014-05-02 MED ORDER — HYDROMORPHONE HCL PF 1 MG/ML IJ SOLN
INTRAMUSCULAR | Status: AC
  Filled 2014-05-02: qty 1

## 2014-05-02 MED ORDER — LACTATED RINGERS IV SOLN
INTRAVENOUS | Status: DC
  Administered 2014-05-02: 07:00:00 via INTRAVENOUS

## 2014-05-02 MED ORDER — SODIUM CHLORIDE 0.9 % IR SOLN
Status: DC | PRN
  Administered 2014-05-02: 08:00:00

## 2014-05-02 MED ORDER — ONDANSETRON HCL 4 MG/2ML IJ SOLN: 4.0000 mg | Freq: Four times a day (QID) | INTRAMUSCULAR | Status: DC | PRN

## 2014-05-02 MED ORDER — CARVEDILOL 25 MG PO TABS
25.0000 mg | ORAL_TABLET | Freq: Every day | ORAL | Status: DC
  Administered 2014-05-02: 25 mg via ORAL
  Filled 2014-05-02: qty 1

## 2014-05-02 MED ORDER — METOCLOPRAMIDE HCL 5 MG/ML IJ SOLN: 5.0000 mg | Freq: Three times a day (TID) | INTRAMUSCULAR | Status: DC | PRN

## 2014-05-02 MED ORDER — NEOSTIGMINE METHYLSULFATE 10 MG/10ML IV SOLN
INTRAVENOUS | Status: DC | PRN
  Administered 2014-05-02: 4 mg via INTRAVENOUS

## 2014-05-02 MED ORDER — HYDROMORPHONE HCL PF 2 MG/ML IJ SOLN
INTRAMUSCULAR | Status: AC
  Filled 2014-05-02: qty 1

## 2014-05-02 MED ORDER — GLYCOPYRROLATE 0.2 MG/ML IJ SOLN
INTRAMUSCULAR | Status: DC | PRN
  Administered 2014-05-02: .7 mg via INTRAVENOUS

## 2014-05-02 MED ORDER — METHOCARBAMOL 500 MG PO TABS
500.0000 mg | ORAL_TABLET | Freq: Four times a day (QID) | ORAL | Status: DC | PRN
  Administered 2014-05-02 – 2014-05-03 (×3): 500 mg via ORAL
  Filled 2014-05-02 (×3): qty 1

## 2014-05-02 MED ORDER — ONDANSETRON HCL 4 MG/2ML IJ SOLN
INTRAMUSCULAR | Status: AC
  Filled 2014-05-02: qty 2

## 2014-05-02 MED ORDER — HYDROMORPHONE HCL PF 1 MG/ML IJ SOLN
INTRAMUSCULAR | Status: DC | PRN
  Administered 2014-05-02 (×2): 0.5 mg via INTRAVENOUS

## 2014-05-02 MED ORDER — CEFAZOLIN SODIUM-DEXTROSE 2-3 GM-% IV SOLR
2.0000 g | Freq: Four times a day (QID) | INTRAVENOUS | Status: AC
  Administered 2014-05-02 – 2014-05-03 (×3): 2 g via INTRAVENOUS
  Filled 2014-05-02 (×3): qty 50

## 2014-05-02 MED ORDER — PROPOFOL 10 MG/ML IV BOLUS
INTRAVENOUS | Status: AC
  Filled 2014-05-02: qty 20

## 2014-05-02 MED ORDER — KETOROLAC TROMETHAMINE 30 MG/ML IJ SOLN
30.0000 mg | Freq: Once | INTRAMUSCULAR | Status: AC
  Administered 2014-05-02: 30 mg via INTRAVENOUS

## 2014-05-02 MED ORDER — DOCUSATE SODIUM 100 MG PO CAPS: 100.0000 mg | ORAL_CAPSULE | Freq: Two times a day (BID) | ORAL | Status: DC

## 2014-05-02 MED ORDER — PROMETHAZINE HCL 25 MG/ML IJ SOLN: 6.2500 mg | INTRAMUSCULAR | Status: DC | PRN

## 2014-05-02 MED ORDER — CEFAZOLIN SODIUM-DEXTROSE 2-3 GM-% IV SOLR
INTRAVENOUS | Status: AC
  Filled 2014-05-02: qty 50

## 2014-05-02 MED ORDER — OXYCODONE HCL 5 MG PO TABS
5.0000 mg | ORAL_TABLET | ORAL | Status: DC | PRN
  Administered 2014-05-02: 5 mg via ORAL
  Administered 2014-05-02 – 2014-05-03 (×7): 10 mg via ORAL
  Filled 2014-05-02 (×8): qty 2

## 2014-05-02 MED ORDER — BUPIVACAINE HCL (PF) 0.5 % IJ SOLN
INTRAMUSCULAR | Status: AC
Start: 2014-05-02 — End: 2014-05-02
  Filled 2014-05-02: qty 30

## 2014-05-02 MED ORDER — LACTATED RINGERS IV SOLN
INTRAVENOUS | Status: AC
Start: 2014-05-02 — End: 2014-05-03

## 2014-05-02 MED ORDER — PHENYLEPHRINE HCL 10 MG/ML IJ SOLN
INTRAMUSCULAR | Status: DC | PRN
  Administered 2014-05-02: 40 ug via INTRAVENOUS

## 2014-05-02 MED ORDER — ENOXAPARIN SODIUM 40 MG/0.4ML ~~LOC~~ SOLN
40.0000 mg | SUBCUTANEOUS | Status: DC
  Administered 2014-05-03: 40 mg via SUBCUTANEOUS
  Filled 2014-05-02 (×2): qty 0.4

## 2014-05-02 MED ORDER — CEFAZOLIN SODIUM-DEXTROSE 2-3 GM-% IV SOLR
2.0000 g | INTRAVENOUS | Status: AC
  Administered 2014-05-02: 2 g via INTRAVENOUS

## 2014-05-02 MED ORDER — BUPIVACAINE-EPINEPHRINE (PF) 0.5% -1:200000 IJ SOLN
INTRAMUSCULAR | Status: AC
  Filled 2014-05-02: qty 30

## 2014-05-02 MED ORDER — BUPIVACAINE HCL 0.5 % IJ SOLN
INTRAMUSCULAR | Status: DC | PRN
  Administered 2014-05-02: 8 mL

## 2014-05-02 MED ORDER — KETOROLAC TROMETHAMINE 30 MG/ML IJ SOLN
INTRAMUSCULAR | Status: AC
  Administered 2014-05-02: 30 mg via INTRAVENOUS
  Filled 2014-05-02: qty 1

## 2014-05-02 MED ORDER — KETOROLAC TROMETHAMINE 30 MG/ML IJ SOLN
30.0000 mg | Freq: Four times a day (QID) | INTRAMUSCULAR | Status: DC
  Administered 2014-05-02 – 2014-05-03 (×4): 30 mg via INTRAVENOUS
  Filled 2014-05-02 (×3): qty 1
  Filled 2014-05-02: qty 2
  Filled 2014-05-02 (×4): qty 1

## 2014-05-02 MED ORDER — ONDANSETRON HCL 4 MG PO TABS
4.0000 mg | ORAL_TABLET | Freq: Four times a day (QID) | ORAL | Status: DC | PRN
  Administered 2014-05-03: 4 mg via ORAL
  Filled 2014-05-02: qty 1

## 2014-05-02 MED ORDER — HYDROMORPHONE HCL PF 1 MG/ML IJ SOLN
0.2500 mg | INTRAMUSCULAR | Status: DC | PRN
  Administered 2014-05-02 (×4): 0.5 mg via INTRAVENOUS

## 2014-05-02 MED ORDER — METHOCARBAMOL 500 MG PO TABS: 500.0000 mg | ORAL_TABLET | Freq: Four times a day (QID) | ORAL | Status: DC | PRN

## 2014-05-02 MED ORDER — INSULIN ASPART 100 UNIT/ML ~~LOC~~ SOLN
0.0000 [IU] | Freq: Three times a day (TID) | SUBCUTANEOUS | Status: DC
  Administered 2014-05-02: 3 [IU] via SUBCUTANEOUS
  Administered 2014-05-03: 2 [IU] via SUBCUTANEOUS

## 2014-05-02 MED ORDER — OXYCODONE-ACETAMINOPHEN 7.5-325 MG PO TABS: 1.0000 | ORAL_TABLET | ORAL | Status: DC | PRN

## 2014-05-02 MED ORDER — ROCURONIUM BROMIDE 100 MG/10ML IV SOLN
INTRAVENOUS | Status: AC
  Filled 2014-05-02: qty 1

## 2014-05-02 MED ORDER — MIDAZOLAM HCL 2 MG/2ML IJ SOLN
INTRAMUSCULAR | Status: AC
  Filled 2014-05-02: qty 2

## 2014-05-02 MED ORDER — GLYCOPYRROLATE 0.2 MG/ML IJ SOLN
INTRAMUSCULAR | Status: AC
  Filled 2014-05-02: qty 3

## 2014-05-02 MED ORDER — HYDROMORPHONE HCL PF 1 MG/ML IJ SOLN
INTRAMUSCULAR | Status: AC
  Administered 2014-05-02: 1 mg via INTRAVENOUS
  Filled 2014-05-02: qty 1

## 2014-05-02 MED ORDER — PROPOFOL 10 MG/ML IV BOLUS
INTRAVENOUS | Status: DC | PRN
  Administered 2014-05-02: 200 mg via INTRAVENOUS

## 2014-05-02 MED ORDER — HYDROMORPHONE HCL PF 1 MG/ML IJ SOLN
0.5000 mg | INTRAMUSCULAR | Status: DC | PRN
  Administered 2014-05-02 – 2014-05-03 (×3): 1 mg via INTRAVENOUS
  Filled 2014-05-02 (×4): qty 1

## 2014-05-02 SURGICAL SUPPLY — 40 items
BAG SPEC THK2 15X12 ZIP CLS (MISCELLANEOUS)
BAG ZIPLOCK 12X15 (MISCELLANEOUS) ×1 IMPLANT
BIT DRILL 2.8X128 (BIT) ×2 IMPLANT
BIT DRILL 2.8X128MM (BIT) ×1
CLOTH 2% CHLOROHEXIDINE 3PK (PERSONAL CARE ITEMS) ×3 IMPLANT
CUFF TOURN SGL QUICK 34 (TOURNIQUET CUFF) ×3
CUFF TRNQT CYL 34X4X40X1 (TOURNIQUET CUFF) ×1 IMPLANT
DRAPE LG THREE QUARTER DISP (DRAPES) ×2 IMPLANT
DRAPE U-SHAPE 47X51 STRL (DRAPES) ×2 IMPLANT
DRSG EMULSION OIL 3X3 NADH (GAUZE/BANDAGES/DRESSINGS) ×2 IMPLANT
DURAPREP 26ML APPLICATOR (WOUND CARE) ×3 IMPLANT
ELECT REM PT RETURN 9FT ADLT (ELECTROSURGICAL) ×3
ELECTRODE REM PT RTRN 9FT ADLT (ELECTROSURGICAL) ×1 IMPLANT
GLOVE BIOGEL PI IND STRL 7.5 (GLOVE) ×1 IMPLANT
GLOVE BIOGEL PI INDICATOR 7.5 (GLOVE) ×2
GLOVE SURG SS PI 7.5 STRL IVOR (GLOVE) ×3 IMPLANT
GLOVE SURG SS PI 8.0 STRL IVOR (GLOVE) ×6 IMPLANT
GOWN BRE IMP PREV XXLGXLNG (GOWN DISPOSABLE) ×2 IMPLANT
GOWN STRL REUS W/TWL LRG LVL3 (GOWN DISPOSABLE) ×3 IMPLANT
GOWN STRL REUS W/TWL XL LVL3 (GOWN DISPOSABLE) ×6 IMPLANT
IMPL SYS BIOCOMP ACH SPEED (Anchor) IMPLANT
IMPLANT SYS BIOCOMP ACH SPEED (Anchor) ×3 IMPLANT
KIT BASIN OR (CUSTOM PROCEDURE TRAY) ×3 IMPLANT
MANIFOLD NEPTUNE II (INSTRUMENTS) ×3 IMPLANT
MARKER SKIN SURG 5.25 VIO NS (MISCELLANEOUS) ×2 IMPLANT
NDL MA TROC 1/2 (NEEDLE) IMPLANT
NEEDLE MA TROC 1/2 (NEEDLE) ×3 IMPLANT
PACK LOWER EXTREMITY WL (CUSTOM PROCEDURE TRAY) ×3 IMPLANT
PAD CAST 4YDX4 CTTN HI CHSV (CAST SUPPLIES) ×1 IMPLANT
PADDING CAST COTTON 4X4 STRL (CAST SUPPLIES) ×6
PADDING CAST SYNTHETIC 4 (CAST SUPPLIES) ×4
PADDING CAST SYNTHETIC 4X4 STR (CAST SUPPLIES) IMPLANT
POSITIONER SURGICAL ARM (MISCELLANEOUS) ×3 IMPLANT
SPLINT FIBERGLASS 4X30 (CAST SUPPLIES) ×4 IMPLANT
SPONGE GAUZE 4X4 12PLY (GAUZE/BANDAGES/DRESSINGS) ×4 IMPLANT
SPONGE LAP 4X18 X RAY DECT (DISPOSABLE) ×2 IMPLANT
STAPLER VISISTAT (STAPLE) ×3 IMPLANT
SUT ETHIBOND 2 (SUTURE) ×1 IMPLANT
SUT VIC AB 2-0 CT2 27 (SUTURE) ×4 IMPLANT
SUT VICRYL 0 27 CT2 27 ABS (SUTURE) ×2 IMPLANT

## 2014-05-02 NOTE — Anesthesia Postprocedure Evaluation (Signed)
  Anesthesia Post-op Note  Patient: Ryan [name]  Procedure(s) Performed: Procedure(s) (LRB): RIGHT ACHILLES TENDON REPAIR (Right)  Patient Location: PACU  Anesthesia Type: General  Level of Consciousness: awake and alert   Airway and Oxygen Therapy: Patient Spontanous Breathing  Post-op Pain: mild  Post-op Assessment: Post-op Vital signs reviewed, Patient's Cardiovascular Status Stable, Respiratory Function Stable, Patent Airway and No signs of Nausea or vomiting  Last Vitals:  Filed Vitals:   05/02/14 0918  BP: 139/85  Pulse: 85  Temp: 36.9 C  Resp: 15    Post-op Vital Signs: stable   Complications: No apparent anesthesia complications

## 2014-05-02 NOTE — Care Management Note (Signed)
    Page 1 of 1   05/02/2014     3:55:58 PM CARE MANAGEMENT NOTE 05/02/2014  Patient:  Ryan [name],Ryan [name]   Account Number:  [id number]  Date Initiated:  05/02/2014  Documentation initiated by:  [facility]  Subjective/Objective Assessment:   adm: R posterior ankle pain; R ACHILLES TENDON REPAIR     Action/Plan:   discharge planning   Anticipated DC Date:  05/03/2014   Anticipated DC Plan:  HOME/SELF CARE  In-house referral  [place]  CM consult  [facility]      Choice offered to / List presented to:             Status of service:  Completed, signed off Medicare Important Message given?   (If response is "NO", the following Medicare IM given date fields will be blank) Date Medicare IM given:   Date Additional Medicare IM given:    Discharge Disposition:  HOME/SELF CARE  Per UR Regulation:    If discussed at Long Length of Stay Meetings, dates discussed:    Comments:  05/02/14 13:15 CM met with pt in room and gave pt [facility] handout and explained to pt he can begin the process of obtaining an orange card, securing insurance/medicaid, get his prescriptions filled and follow up care as he has an appt. with Dr Zigmund [facility] [phone] on June 2 at 1:15 pm to secure a PCP and hospitalization follow up care. Pt was also given a [place] which provides discounted generic diabetic medications.  Pt has a prescription for a scooter and CM gave him handout for [employer]. Pt verbalized understanding of all aforementioned and understands he can use [facility] pharmacy (all instructions on [facility] handout).  No other CM needs were communicated.  Mariane Masters, BSN, CM [phone].

## 2014-05-02 NOTE — Interval H&P Note (Signed)
History and Physical Interval Note:  05/02/2014 7:35 AM  Cecil CobbsAnthony Dyck  has presented today for surgery, with the diagnosis of right achilles tendon rupture  The various methods of treatment have been discussed with the patient and family. After consideration of risks, benefits and other options for treatment, the patient has consented to  Procedure(s): RIGHT ACHILLES TENDON REPAIR (Right) as a surgical intervention .  The patient's history has been reviewed, patient examined, no change in status, stable for surgery.  I have reviewed the patient's chart and labs.  Questions were answered to the patient's satisfaction.     Javier DockerJeffrey C Ronon Ferger

## 2014-05-02 NOTE — Brief Op Note (Signed)
05/02/2014  9:06 AM  PATIENT:  Ryan [name]  53 y.o. male  PRE-OPERATIVE DIAGNOSIS:  right achilles tendon rupture  POST-OPERATIVE DIAGNOSIS:  right achilles tendon rupture  PROCEDURE:  Procedure(s): RIGHT ACHILLES TENDON REPAIR (Right)  SURGEON:  Surgeon(s) and Role:    * Javier DockerJeffrey C Evalene Vath, MD - Primary  PHYSICIAN ASSISTANT:   ASSISTANTS: Bissell   ANESTHESIA:   general  EBL:     BLOOD ADMINISTERED:none  DRAINS: none   LOCAL MEDICATIONS USED:  MARCAINE     SPECIMEN:  No Specimen  DISPOSITION OF SPECIMEN:  N/A  COUNTS:  YES  TOURNIQUET:   Total Tourniquet Time Documented: Thigh (Right) - 46 minutes Total: Thigh (Right) - 46 minutes   DICTATION: .Other Dictation: Dictation Number [id number]  PLAN OF CARE: Admit for overnight observation  PATIENT DISPOSITION:  PACU - hemodynamically stable.   Delay start of Pharmacological VTE agent (>24hrs) due to surgical blood loss or risk of bleeding: no

## 2014-05-02 NOTE — Op Note (Signed)
NAMDia Sitter:  Frenette, Kylie            ACCOUNT NO.:  [id number]  MEDICAL RECORD NO.:  [id number]  LOCATION:  1609                         [facility]  PHYSICIAN:  Jene EveryJeffrey Tyreek Clabo, M.D.    DATE OF BIRTH:  1961/08/26  DATE OF PROCEDURE:  05/02/2014 DATE OF DISCHARGE:                              OPERATIVE REPORT   PREOPERATIVE DIAGNOSIS:  Achilles tendon rupture on the right.  POSTOPERATIVE DIAGNOSIS:  Achilles tendon rupture on the right.  PROCEDURE PERFORMED: 1. Open repair of Achilles tendon avulsion from the calcaneus     utilizing SwiveLock suture anchors. 2. Excision of osteophyte of the calcaneus.  ANESTHESIA:  General.  ASSISTANT:  Lanna PocheJacqueline Bissell, PA, was utilized for holding the extremity, patient positioning, closure, and advancing the tendon.  BRIEF HISTORY:  A 53 year old male with ruptured tendon from the calcaneus, 2 cm indicated for repair.  Risk and benefits discussed including bleeding, infection, re-tear, DVT, PE, etc., history of diabetes, had some chronic tendon tear, osteophyte calcaneus noted as well.  TECHNIQUE:  The patient in supine position after induction of adequate general anesthesia, 2 g Kefzol, he was placed prone on the table.  All bony prominences well padded.  The right lower extremity was prepped and draped in usual sterile fashion.  Thigh tourniquet inflated to 300 mmHg. A curvilinear incision based on the distal Achilles tendon was performed to the skin only, the distal portion just to the lateral aspect of the tendon just above the shoeline.  We made incision approximately 8 cm in length.  The tendon was avulsed from the calcaneus and a small stump remained.  There was a large osteophyte just to the anterior aspect of this insertion.  Incised through the peritenon.  Had significant tearing of the tendon 2 cm of retraction.  We took care to protect sural nerve throughout the case, soft tissue retractors.  We exposed the insertion of the  calcaneus tuberosity, removed the osteophyte, created a bed in cancellous tissue with a Beyer rongeur.  I then used a SwiveLock technique where we used 4 SwiveLocks.  We placed 2 drill holes in the calcaneus insertion cancellous bed, drilled and tapped these, and inserted a SwiveLock anchor.  With the fiber tape, we advanced the tendon down to the level of the insertion and then used our fiber tape, brought it up through the anterior aspect of the tendon to the posterior aspect of the tendon on both sides.  We crossed the fiber tape over the top of the tendon and then, delivered them into a PushLock, SwiveLock construct through 2 separate drill holes, just again in the cancellous band that was fashioned after utilizing the drill and an awl.  We inserted them with appropriate tension.  This delivered the stump of the tendon to the cancellous bed.  Prior to that, we had debrided the edges of the tendon to good tissue.  There was centralized tearing of the tendon as well, though it did appear to be healthy.  After delivering this in the cancellous bed, we had excellent resistance to pull out.  We had 2 rescue sutures that we threaded up through the tendon as well and tied it with good surgical knot.  This was repaired in slight plantarflexion.  We were able to plantarflex to neutral without difficulty.  Copiously irrigated the wound, repaired the peritenon and the subcu with 2-0, again not involving the sural nerve throughout the case.  The skin was reapproximated with staples.  Wound was dressed sterilely.  Tourniquet was deflated and there was adequate revascularization of lower extremity appreciated.  Short-leg cast was applied in slight plantarflexion without difficulty.  The patient was extubated and transported to the recovery room in satisfactory condition.  The patient tolerated the procedure well.  No complications.  Minimal blood loss.  Tourniquet time, 45  minutes.     Jene EveryJeffrey Ashika Apuzzo, M.D.     Cordelia PenJB/MEDQ  D:  05/02/2014  T:  05/02/2014  Job:  [id number]

## 2014-05-02 NOTE — Discharge Instructions (Signed)
Remain nonweightbearing right leg, use crutches or kneeling scooter for assistance Ice and elevate right leg to reduce swelling Keep cast clean and dry Take Aspirin 325mg  daily to prevent blood clots Follow up in the office in 2 weeks with Dr. Shelle IronBeane for staple removal

## 2014-05-02 NOTE — Transfer of Care (Signed)
Immediate Anesthesia Transfer of Care Note  Patient: Ryan [name]  Procedure(s) Performed: Procedure(s) (LRB): RIGHT ACHILLES TENDON REPAIR (Right)  Patient Location: PACU  Anesthesia Type: General  Level of Consciousness: sedated, patient cooperative and responds to stimulation  Airway & Oxygen Therapy: Patient Spontanous Breathing and Patient connected to face mask oxgen  Post-op Assessment: Report given to PACU RN and Post -op Vital signs reviewed and stable  Post vital signs: Reviewed and stable  Complications: No apparent anesthesia complications

## 2014-05-02 NOTE — H&P (View-Only) (Signed)
Ryan [name] DOB: 11/16/1961  Chief Complaint: R posterior ankle pain  History of Present Illness The patient is a 52 year old male who presents to the practice today for a transition into care. The patient is transitioning into care from emergency room and a summary of care was reviewed .  Ankle painis described as the following: The patient reports right Achilles' tendon symptoms which began 9 day(s) ago following a specific injury. Symptoms are reported to be located in the right posterior ankle and include ankle pain (and muscle spasms), difficulty bearing weight and audible pop at the time of injury. Current treatment includes crutches (partial weight bearing) (with a CAM boot). Prior to being seen today the patient was previously evaluated in the emergency room. Previous work-up for this problem has included ankle x-rays and ankle MRI. Past treatment for this problem has included crutches (partial weight bearing) (CAM boot) and opioid analgesics.  Ryan [name] comes in today for eval of his R ankle. He was stepping off a curb 9 days ago when he felt and heard a sudden pop and pain. He presented to the ER where he had an xray and MRI confirming achilles tear. He was placed in a CAM walker and is using crutches, partially weightbearing. He has been out of work- floor buffering. He ran out of Percocet but it had been helping with the pain. He has been icing and elevating as well. Denies hx of DVT or MRSA. He is an insulin dependent diabetic, reports sugars in the low 100's, well controlled.  Allergies No Known Drug Allergies. 04/26/2014  Family History Hypertension. Mother.  Social History Children. 0 Current drinker. 04/26/2014: Currently drinks beer only occasionally per week Current work status. working part time Exercise. Exercises rarely; does running / walking Living situation. live with parents Marital status. single No history of drug/alcohol rehab Not under  pain contract Tobacco / smoke exposure. 04/26/2014: no Tobacco use. Never smoker. 04/26/2014  Medication History insulin Active. anti-reflux medcation Active. antihypertensive medication Active. Medications Reconciled.  Past Surgical History No pertinent past surgical history   Past Medical Hx Diabetes Mellitus, Type I High blood pressure  Review of Systems General:Not Present- Chills, Fever, Night Sweats, Appetite Loss, Fatigue, Feeling sick, Weight Gain and Weight Loss. HEENT:Not Present- Sensitivity to light, Hearing problems, Nose Bleed and Ringing in the Ears. Respiratory:Not Present- Snoring, Chronic Cough, Bloody sputum and Dyspnea. Cardiovascular:Not Present- Shortness of Breath, Chest Pain, Swelling of Extremities, Leg Cramps and Palpitations. Gastrointestinal:Not Present- Bloody Stool, Heartburn, Abdominal Pain, Vomiting, Nausea and Incontinence of Stool. Male Genitourinary:Not Present- Blood in Urine, Frequency, Incontinence and Nocturia. Musculoskeletal:Present- Muscle Pain, Joint Stiffness, Joint Swelling and Joint Pain.  Vitals 04/26/2014 12:35 PM Weight: 240 lb Height: 69 in Body Surface Area: 2.3 m Body Mass Index: 35.44 kg/m BP: 157/86 (Sitting, Right Arm, Standard)  Physical Exam The physical exam findings are as follows:  General Mental Status - Alert. General Appearance- pleasant and In acute distress (mild). Orientation- Oriented X3. Build & Nutrition- Well nourished. Gait- Use of assistive device (crutches). Mental Status- Alert.  Peripheral Vascular Lower Extremity: Palpation:Tenderness- Right- no calf tenderness to palpation. Posterior tibial pulse- Bilateral- 2+. Dorsalis pedis pulse- Bilateral- 2+.  Neurologic Sensation:Lower Extremity- Right- sensation is intact in the lower extremity, unless otherwise mentioned.  Musculoskeletal Lower Extremity Right Lower Extremity: Right Ankle: Inspection  and Palpation:Clinical Contours- loss of normal clinical contours. Tenderness- Achilles tendon tender to palpation and Achilles tendon insertion tender to palpation. no tenderness to palpation of the   base of the 5th metatarsal and no tenderness to palpation of the calf. Swelling- moderate. Effusion- none. Tissue tension/texture is - soft. Sensation is - normal. Achilles:Tenderness- distal Achilles tendon tender to palpation and insertional Achilles tendon tender to palpation. Warmth- no warmth with palpation of the Achilles tendon. Defect- palpable defect present. Strength and Tone:Plantarflexion- 5/5. Dorsiflexion- 5/5. Inversion- 5/5. Eversion- 5/5. ROM:Testing limited- due to pain. ZOX:WRUEAVWSpecial Tests- Thompson's test positive. Right Foot: Inspection and Palpation:Tenderness- no tenderness to palpation. Swelling- none.  Imaging xrays and MRI images and report R ankle reviewed by Dr. Shelle IronBeane with no fx, subluxation, dislocation, lytic or blastic lesions. Osteophytes noted at the plantar and posterior calcaneus. MRI + for achilles rupture distal aspect from the calcaneal insertion with approx 2cm of retraction.  Assessment & Plan Right achilles tendon rupture  Pt with R achilles tendon rupture 9 days s/p injury, no prior hx of achilles/ankle pain or injury. We discussed the nature of his injury and the need for surgical repair. Discussed the procedure itself, repair R achilles tendon rupture, as well as risks, complications, and alternatives including but not limited to DVT, PE, infx, bleeding, failure of procedure, need for secondary procedure, anesthesia risk, even death. Discussed post-op protocols, casting in plantar flexion, need for DVT ppx, time out of work, need for PT post-operatively. He will likely require an overnight hospital stay. In the interim, he will remain in his CAM walker, crutches to assist with weightbearing. I did also give him a script for a kneeling rolling  scooter to help him post-operatively. Note given to remain out of work. New Rx for Percocet given for pain. Plan ASA for DVT ppx post-op. Continue ice and elevation to reduce swelling pre-op as much as possible. He will follow up 10-14 days post-op for staple removal and recasting and will call with any questions or concerns in the interim. Plan to schedule surgery for next week.  Plan R achilles tendon repair  Signed electronically by Andrez GrimeJaclyn Saheed Carrington PA-C for Dr. Shelle IronBeane

## 2014-05-03 LAB — BASIC METABOLIC PANEL
BUN: 26 mg/dL — ABNORMAL HIGH (ref 6–23)
CHLORIDE: 99 meq/L (ref 96–112)
CO2: 24 mEq/L (ref 19–32)
CREATININE: 1.67 mg/dL — AB (ref 0.50–1.35)
Calcium: 8.6 mg/dL (ref 8.4–10.5)
GFR calc non Af Amer: 46 mL/min — ABNORMAL LOW (ref >90)
GFR, EST AFRICAN AMERICAN: 54 mL/min — AB (ref >90)
Glucose, Bld: 157 mg/dL — ABNORMAL HIGH (ref 70–99)
Potassium: 4.6 mEq/L (ref 3.7–5.3)
Sodium: 133 mEq/L — ABNORMAL LOW (ref 137–147)

## 2014-05-03 LAB — GLUCOSE, CAPILLARY
Glucose-Capillary: 145 mg/dL — ABNORMAL HIGH (ref 70–99)
Glucose-Capillary: 114 mg/dL — ABNORMAL HIGH (ref 70–99)

## 2014-05-03 MED ORDER — PANTOPRAZOLE SODIUM 40 MG PO TBEC
40.0000 mg | DELAYED_RELEASE_TABLET | Freq: Once | ORAL | Status: AC
  Administered 2014-05-03: 40 mg via ORAL
  Filled 2014-05-03: qty 1

## 2014-05-03 NOTE — Evaluation (Addendum)
Physical Therapy Evaluation Patient Details Name: Ryan [name] MRN: [id number] DOB: [birth year] Today's Date: 05/03/2014   History of Present Illness  s/p R Achilles tendon repair, TTWB  Clinical Impression  *Pt admitted with *R Achilles tendon rupture, s/p repair*. Pt currently with functional limitations due to the deficits listed below (see PT Problem List).  Pt will benefit from skilled PT to increase their independence and safety with mobility to allow discharge to the venue listed below.   Pt ambulated 30' with crutches with min assist for balance, distance limited by 10/10 pain despite premedication for pain.Marland Kitchen. He would benefit from another PT session this afternoon for stair training and trial of knee scooter.  **    Follow Up Recommendations Supervision for mobility/OOB    Equipment Recommendations  3in1 (PT)    Recommendations for Other Services   HHPT    Precautions / Restrictions Restrictions Weight Bearing Restrictions: Yes Other Position/Activity Restrictions: TDWB      Mobility  Bed Mobility Overal bed mobility: Modified Independent             General bed mobility comments: used rail for supine to sit  Transfers Overall transfer level: Needs assistance Equipment used: Crutches Transfers: Sit to/from Stand Sit to Stand: Min assist         General transfer comment: min A for posterior LOB initially upon standing  Ambulation/Gait Ambulation/Gait assistance: Min guard Ambulation Distance (Feet): 30 Feet Assistive device: Crutches Gait Pattern/deviations: Step-to pattern   Gait velocity interpretation: Below normal speed for age/gender General Gait Details: instructed pt in TTWB, good adherence to WB status, gait distance limited by RLE pain  Stairs            Wheelchair Mobility    Modified Rankin (Stroke Patients Only)       Balance Overall balance assessment: Needs assistance   Sitting balance-Leahy Scale: Good        Standing balance-Leahy Scale: Poor Standing balance comment: requires BUE support 2* TTWB on rLE                             Pertinent Vitals/Pain *9/10 R ankle at rest, 10/10 in dependent position Premedicated, RLE elevated, ice applied**    Home Living Family/patient expects to be discharged to:: Private residence Living Arrangements: Parent (lives with mother who is ill, pt assists her with bathing, toileting; sister and brother may come help)     Home Access: Stairs to enter Entrance Stairs-Rails: Right;Left;Can reach both Secretary/administratorntrance Stairs-Number of Steps: 3   Home Equipment: Crutches      Prior Function Level of Independence: Independent               Hand Dominance        Extremity/Trunk Assessment   Upper Extremity Assessment: Overall WFL for tasks assessed           Lower Extremity Assessment: RLE deficits/detail RLE Deficits / Details: hip/knee WFL, ankle NT, can wiggle toes    Cervical / Trunk Assessment: Normal  Communication   Communication: No difficulties  Cognition Arousal/Alertness: Awake/alert Behavior During Therapy: WFL for tasks assessed/performed Overall Cognitive Status: Within Functional Limits for tasks assessed                      General Comments      Exercises        Assessment/Plan    PT Assessment Patient needs continued PT services  PT Diagnosis Difficulty walking;Acute pain   PT Problem List Decreased activity tolerance;Pain;Decreased balance;Decreased mobility  PT Treatment Interventions DME instruction;Gait training;Stair training;Functional mobility training;Therapeutic activities;Patient/family education;Balance training;Therapeutic exercise   PT Goals (Current goals can be found in the Care Plan section) Acute Rehab PT Goals Patient Stated Goal: to be able to care for his mother PT Goal Formulation: With patient Time For Goal Achievement: 05/17/14 Potential to Achieve Goals: Good     Frequency Min 6X/week   Barriers to discharge        Co-evaluation               End of Session Equipment Utilized During Treatment: Gait belt, R boot Activity Tolerance: Patient limited by pain Patient left: in chair;with call bell/phone within reach Nurse Communication: Patient requests pain meds;Mobility status    Functional Assessment Tool Used: clinical judgement Functional Limitation: Mobility: Walking and moving around Mobility: Walking and Moving Around Current Status [phone]): At least 20 percent but less than 40 percent impaired, limited or restricted Mobility: Walking and Moving Around Goal Status [phone]): At least 1 percent but less than 20 percent impaired, limited or restricted    Time: 0802-2336 PT Time Calculation (min): 26 min   Charges:   PT Evaluation $Initial PT Evaluation Tier I: 1 Procedure PT Treatments $Gait Training: 8-22 mins   PT G Codes:   Functional Assessment Tool Used: clinical judgement Functional Limitation: Mobility: Walking and moving around    [employer] 05/03/2014, 11:11 AM [phone]

## 2014-05-03 NOTE — Progress Notes (Signed)
Physical Therapy Treatment Patient Details Name: Ryan [name] MRN: [id number] DOB: [birth year] Today's Date: 05/14/2014    History of Present Illness s/p R Achilles tendon repair, TTWB    PT Comments    Marked improvement in activity tolerance with pm with better pain control.  Pt eager for d/c home  Follow Up Recommendations  Supervision for mobility/OOB;Home health PT     Equipment Recommendations  3in1 (PT)    Recommendations for Other Services       Precautions / Restrictions Precautions Precautions: Fall Restrictions Weight Bearing Restrictions: Yes RLE Weight Bearing: Touchdown weight bearing Other Position/Activity Restrictions: TDWB    Mobility  Bed Mobility Overal bed mobility: Modified Independent                Transfers Overall transfer level: Modified independent Equipment used: Crutches Transfers: Sit to/from Stand Sit to Stand: Modified independent (Device/Increase time)         General transfer comment: min cues for saftey and to maintain TDWB  Ambulation/Gait Ambulation/Gait assistance: Min guard;Supervision Ambulation Distance (Feet): 100 Feet Assistive device: Crutches Gait Pattern/deviations: Step-to pattern;Step-through pattern     General Gait Details: instructed pt in TTWB, good adherence to WB status - pt primarily performing NWB   Stairs Stairs: Yes Stairs assistance: Min guard Stair Management: One rail Right;Step to pattern;Forwards;With crutches Number of Stairs: 5 General stair comments: cues for sequence and crutch/foot placement  Wheelchair Mobility    Modified Rankin (Stroke Patients Only)       Balance                                    Cognition Arousal/Alertness: Awake/alert Behavior During Therapy: WFL for tasks assessed/performed Overall Cognitive Status: Within Functional Limits for tasks assessed                      Exercises      General Comments         Pertinent Vitals/Pain 8/10; RN aware    Home Living                      Prior Function            PT Goals (current goals can now be found in the care plan section) Acute Rehab PT Goals Patient Stated Goal: to be able to care for his mother PT Goal Formulation: With patient Time For Goal Achievement: 05/17/14 Potential to Achieve Goals: Good Progress towards PT goals: Progressing toward goals    Frequency  Min 6X/week    PT Plan Current plan remains appropriate    Co-evaluation             End of Session Equipment Utilized During Treatment: Gait belt Activity Tolerance: Patient tolerated treatment well Patient left: in chair;with call bell/phone within reach     Time: 1450-1508 PT Time Calculation (min): 18 min  Charges:  $Gait Training: 8-22 mins                    G Codes:      Brien Few 14-May-2014, 4:06 PM

## 2014-05-03 NOTE — Progress Notes (Signed)
CSW consulted for SNF placement. PN reviewed. Pt will return home following hospital d/c. RNCM will assist with d/c planning.  Cori Razor LCSW [phone]

## 2014-05-03 NOTE — Progress Notes (Signed)
OT Cancellation Note  Patient Details Name: Ryan [name] MRN: [id number] DOB: September 26, 1961  Pt did not feel he needed OT services. Will Sign off. Alba Cory 05/03/2014, 11:36 AM

## 2014-05-03 NOTE — Progress Notes (Signed)
Subjective: 1 Day Post-Op Procedure(s) (LRB): RIGHT ACHILLES TENDON REPAIR (Right) Patient reports pain as moderate. Reports pain posterior ankle, same as yesterday although he looks more comfortable. Denies numbness or tingling. No other c/o.    Objective: Vital signs in last 24 hours: Temp:  [97.6 F (36.4 C)-99.1 F (37.3 C)] 98.6 F (37 C) (05/22 0550) Pulse Rate:  [65-85] 73 (05/22 0550) Resp:  [15-18] 16 (05/22 0550) BP: (139-158)/(79-93) 150/79 mmHg (05/22 0550) SpO2:  [93 %-100 %] 97 % (05/22 0550) Weight:  [100.245 kg (221 lb)] 100.245 kg (221 lb) (05/21 1030)  Intake/Output from previous day: 05/21 0701 - 05/22 0700 In: 2865.2 [P.O.:720; I.V.:1995.2; IV Piggyback:150] Out: 2625 [Urine:2625] Intake/Output this shift:     Recent Labs  05/02/14 1153  HGB 12.8*    Recent Labs  05/02/14 1153  WBC 6.9  RBC 4.72  HCT 37.8*  PLT 211    Recent Labs  05/02/14 1153 05/03/14 0430  NA  --  133*  K  --  4.6  CL  --  99  CO2  --  24  BUN  --  26*  CREATININE 1.85* 1.67*  GLUCOSE  --  157*  CALCIUM  --  8.6   No results found for this basename: LABPT, INR,  in the last 72 hours  Neurologically intact ABD soft Neurovascular intact Sensation intact distally Intact pulses distally Dorsiflexion/Plantar flexion intact Incision: dressing C/D/I and no drainage No cellulitis present Compartment soft no calf pain Cast clean and dry, bivalved, wrapped in ACE  Assessment/Plan: 1 Day Post-Op Procedure(s) (LRB): RIGHT ACHILLES TENDON REPAIR (Right) Advance diet Up with therapy D/C IV fluids Plan D/C home today after PT (likely late afternoon)- remain NWB RLE, can toe touch for balance purposes only He lives alone but will ask friends for assistance Continue ice and elevation ASA for DVT ppx at home Follow up outpt 10-14 days staple removal  will discuss with Dr. Tenna Delaine M. Berkley Wrightsman PA-C 05/03/2014, 7:35 AM

## 2014-05-03 NOTE — Discharge Summary (Signed)
Patient ID: Ryan Cobbsnthony Meinecke MRN: [id number] DOB/AGE: Mar 24, 1961 53 y.o.  Admit date: 05/02/2014 Discharge date: 05/03/2014  Admission Diagnoses:  Principal Problem:   Rupture of right Achilles tendon Active Problems:   Tendon rupture, Achilles   Discharge Diagnoses:  Same  Past Medical History  Diagnosis Date  . Diabetes mellitus   . Hypertension   . Drug abuse     cocaine, marijuana abuse  . Chest pain     due to cocaine use  . Renal insufficiency   . GERD (gastroesophageal reflux disease)   . Arthritis     back and shoulders  . Achilles tendon rupture right    Surgeries: Procedure(s): RIGHT ACHILLES TENDON REPAIR on 05/02/2014   Consultants:   none  Discharged Condition: Improved  Hospital Course: Ryan [name] is an 53 y.o. male who was admitted 05/02/2014 for operative treatment ofRupture of right Achilles tendon. Patient has severe unremitting pain that affects sleep, daily activities, and work/hobbies. After pre-op clearance the patient was taken to the operating room on 05/02/2014 and underwent  Procedure(s): RIGHT ACHILLES TENDON REPAIR.    Patient was given perioperative antibiotics: Anti-infectives   Start     Dose/Rate Route Frequency Ordered Stop   05/02/14 1400  ceFAZolin (ANCEF) IVPB 2 g/50 mL premix     2 g 100 mL/hr over 30 Minutes Intravenous Every 6 hours 05/02/14 1053 05/03/14 0205   05/02/14 0820  polymyxin B 500,000 Units, bacitracin 50,000 Units in sodium chloride irrigation 0.9 % 500 mL irrigation  Status:  Discontinued       As needed 05/02/14 0821 05/02/14 0914   05/02/14 0600  ceFAZolin (ANCEF) IVPB 2 g/50 mL premix     2 g 100 mL/hr over 30 Minutes Intravenous On call to O.R. 05/02/14 0530 05/02/14 0758       Patient was given sequential compression devices, early ambulation, and chemoprophylaxis to prevent DVT.  Patient benefited maximally from hospital stay and there were no complications.    Recent vital signs: Patient Vitals  for the past 24 hrs:  BP Temp Temp src Pulse Resp SpO2  05/03/14 0550 150/79 mmHg 98.6 F (37 C) Oral 73 16 97 %  05/03/14 0132 147/85 mmHg 99.1 F (37.3 C) Oral 71 16 98 %  05/02/14 2135 158/90 mmHg 98.5 F (36.9 C) Oral 70 18 99 %  05/02/14 1858 141/79 mmHg 98.1 F (36.7 C) Oral 72 18 100 %  05/02/14 1332 142/87 mmHg 97.9 F (36.6 C) Oral 71 18 99 %  05/02/14 1252 156/81 mmHg 98.2 F (36.8 C) Oral 66 18 99 %     Recent laboratory studies:  Recent Labs  05/02/14 1153 05/03/14 0430  WBC 6.9  --   HGB 12.8*  --   HCT 37.8*  --   PLT 211  --   NA  --  133*  K  --  4.6  CL  --  99  CO2  --  24  BUN  --  26*  CREATININE 1.85* 1.67*  GLUCOSE  --  157*  CALCIUM  --  8.6     Discharge Medications:     Medication List    STOP taking these medications       oxyCODONE-acetaminophen 5-325 MG per tablet  Commonly known as:  PERCOCET/ROXICET  Replaced by:  oxyCODONE-acetaminophen 7.5-325 MG per tablet      TAKE these medications       amLODipine 10 MG tablet  Commonly known as:  NORVASC  Take  10 mg by mouth every morning.     carvedilol 25 MG tablet  Commonly known as:  COREG  Take 25 mg by mouth every morning.     docusate sodium 100 MG capsule  Commonly known as:  COLACE  Take 1 capsule (100 mg total) by mouth 2 (two) times daily.     hydrOXYzine 25 MG tablet  Commonly known as:  ATARAX/VISTARIL  Take 25 mg by mouth 3 (three) times daily as needed for itching.     insulin NPH Human 100 UNIT/ML injection  Commonly known as:  HUMULIN N,NOVOLIN N  Inject 20 Units into the skin 2 (two) times daily.     lisinopril-hydrochlorothiazide 20-25 MG per tablet  Commonly known as:  PRINZIDE,ZESTORETIC  Take 1 tablet by mouth every morning.     methocarbamol 500 MG tablet  Commonly known as:  ROBAXIN  Take 1 tablet (500 mg total) by mouth every 6 (six) hours as needed for muscle spasms.     omeprazole 20 MG capsule  Commonly known as:  PRILOSEC  Take 20 mg by  mouth daily.     oxyCODONE-acetaminophen 7.5-325 MG per tablet  Commonly known as:  PERCOCET  Take 1 tablet by mouth every 4 (four) hours as needed for pain.        Diagnostic Studies: No results found.  Disposition: 01-Home or Self Care      Discharge Instructions   Call MD / Call 911    Complete by:  As directed   If you experience chest pain or shortness of breath, CALL 911 and be transported to the hospital emergency room.  If you develope a fever above 101 F, pus (white drainage) or increased drainage or redness at the wound, or calf pain, call your surgeon's office.     Constipation Prevention    Complete by:  As directed   Drink plenty of fluids.  Prune juice may be helpful.  You may use a stool softener, such as Colace (over the counter) 100 mg twice a day.  Use MiraLax (over the counter) for constipation as needed.     Diet - low sodium heart healthy    Complete by:  As directed      Increase activity slowly as tolerated    Complete by:  As directed            Follow-up Information   Follow up with BEANE,JEFFREY C, MD In 2 weeks. (For suture removal)    Specialty:  Orthopedic Surgery   Contact information:   [address] [place] [address] [phone]       Follow up with Dr. Marthann Schiller. (You have an appt June 2 at 1:15 pm (please be prepared for a 20.00 co-pay). If you can't make the appt please call by June 1 to cancel.  Because you have this appt, you may call [phone] for appt for an orange card, and medicaid.)    Contact information:   Dr. [facility]   [address] [place]   [phone] [address]  3rd Floor [place], [place]     Remain [place] RLE for balance purposes only Ice, elevate for swelling Keep cast clean and dry Follow up 2 weeks   Signed: Dayna Barker. Bissell 05/03/2014, 11:31 AM

## 2014-05-14 ENCOUNTER — Encounter: Payer: Self-pay | Admitting: Family Medicine

## 2014-05-14 ENCOUNTER — Ambulatory Visit (INDEPENDENT_AMBULATORY_CARE_PROVIDER_SITE_OTHER): Payer: Self-pay | Admitting: Family Medicine

## 2014-05-14 VITALS — BP 141/87 | HR 85 | Temp 98.2°F | Resp 20 | Ht 69.0 in | Wt 230.0 lb

## 2014-05-14 DIAGNOSIS — M25571 Pain in right ankle and joints of right foot: Secondary | ICD-10-CM

## 2014-05-14 DIAGNOSIS — S86019A Strain of unspecified Achilles tendon, initial encounter: Secondary | ICD-10-CM

## 2014-05-14 DIAGNOSIS — E119 Type 2 diabetes mellitus without complications: Secondary | ICD-10-CM | POA: Insufficient documentation

## 2014-05-14 DIAGNOSIS — I10 Essential (primary) hypertension: Secondary | ICD-10-CM

## 2014-05-14 DIAGNOSIS — S96819A Strain of other specified muscles and tendons at ankle and foot level, unspecified foot, initial encounter: Secondary | ICD-10-CM

## 2014-05-14 DIAGNOSIS — Z23 Encounter for immunization: Secondary | ICD-10-CM

## 2014-05-14 DIAGNOSIS — S93499A Sprain of other ligament of unspecified ankle, initial encounter: Secondary | ICD-10-CM

## 2014-05-14 DIAGNOSIS — S86011A Strain of right Achilles tendon, initial encounter: Secondary | ICD-10-CM

## 2014-05-14 DIAGNOSIS — M25579 Pain in unspecified ankle and joints of unspecified foot: Secondary | ICD-10-CM

## 2014-05-14 NOTE — Progress Notes (Signed)
Subjective:    Patient ID: Ryan [name], male    DOB: 28-Apr-1961, 53 y.o.   MRN: [id number]  HPI Patient presents to practice for transition into care.   Ryan [name] is a 53 year old male that has been without primary care for an extended period. Patient was previously followed by the [facility].   Patient complaining of right ankle pain. He sustained a ruptured tendon while stepping off of a curb. Patient underwent a right Achilles tendon rupture repair on 05/02/2014. He maintains that right ankle pain intensity is 9/10, described as throbbing, constant, and non-radiating. He states that he took Percocet 7.5-325 on yesterday, which decreased pain intensity. He is now out of medication. Pt is scheduled to follow up with Dr. Dollene Primrose in 2 weeks.   Patient has a history of type 2 diabetes. Reports that he checks blood sugars every am and averages between 100-140. Reports that he takes 20 units of novalog twice daily. Reports that he has was diagnosed over 3 years ago.   Patient has hypertension that is controlled by current medication regimen. He reports that blood pressure is controlled on current medication regimen. He takes 3 medications for blood pressure.    Review of Systems  Constitutional: Negative.   HENT: Negative.   Eyes: Negative.   Respiratory: Negative.   Cardiovascular: Negative.   Gastrointestinal: Negative.   Endocrine: Negative.   Genitourinary: Negative.   Musculoskeletal: Positive for myalgias (right tendon surgery).  Skin: Negative.   Allergic/Immunologic: Negative.   Neurological: Negative.   Hematological: Negative.   Psychiatric/Behavioral: Negative.        Objective:   Physical Exam  Constitutional: He is oriented to person, place, and time. Vital signs are normal. He appears well-developed and well-nourished.  HENT:  Head: Normocephalic and atraumatic.  Eyes: Conjunctivae and lids are normal. Pupils are equal, round, and  reactive to light. No scleral icterus.  Neck: Normal range of motion. Neck supple.  Cardiovascular: Normal rate, regular rhythm and normal heart sounds.   Pulmonary/Chest: Effort normal.  Abdominal: Soft. Bowel sounds are normal.  Musculoskeletal:       Right ankle: He exhibits decreased range of motion. Achilles tendon exhibits pain.       Feet:  Lymphadenopathy:       Head (right side): No submental and no submandibular adenopathy present.       Head (left side): No submental and no submandibular adenopathy present.    He has no cervical adenopathy.  Neurological: He is alert and oriented to person, place, and time.  Skin: Skin is warm and dry.  Psychiatric: He has a normal mood and affect. His speech is normal and behavior is normal. Judgment and thought content normal. Cognition and memory are normal.     BP 141/87  Pulse 85  Temp(Src) 98.2 F (36.8 C) (Oral)  Resp 20  Ht 5\' 9"  (1.753 m)  Wt 230 lb (104.327 kg)  BMI 33.95 kg/m2      Assessment & Plan:  1. Right tendon rupture: Repair occurred  2 weeks ago by Dr. Juliene Pina. Patient reports that he had been taking Percocet 7.5-325 mg since his surgery on 05/02/2014. He reports that he is out of pain medication and is requesting a refill. I recommended that patient follow-up with his surgeon for pain related to surgical site.   2. Right ankle pain: Patient will follow up with Dr. Shelle Iron, surgeon. Recommend that patient take OTC Tylenol 500 mg every 6 hours  as needed for mild to moderate pain  3. Diabetes: 20 units NPH twice daily. Patient to continue checking blood sugars twice per daily. CBG was 99 prior to eating this am. Monofilament exam normal to left foot, unable to perform on right foot due to cast.  Discussed signs and symptoms of hyper-hypoglycemia at length. Pt states that he is familiar and has been educated on diabetes management.   4. Hypertension: BP mildly elevated. Patient reports that he has taken hypertension  medications. To continue current medication regimen. Patient report that he receives assistance from the [facility] in [place]. Reports that he has been residing in varying shelters. Reports that he as been eating consistently, but does not cook his meals. Recommend that patient attempt to make good choices when eating. Patient reports that he has been in between homes since being released from incarceration 3 years ago.   Preventative Care:  Immunizations: Received Tdap vaccination without complication Eye exam: 4-5 months ago, wears glasses [facility]: [facility]. Patient cannot recall who performed the colonoscopy or the facility in which it was performed.  Will review remote medical history as records become available.   RTC: 1 month Will come for labs 1 week prior. Labs: HgbA1c, BMP, and UA.

## 2014-05-15 ENCOUNTER — Encounter: Payer: Self-pay | Admitting: Family Medicine

## 2014-05-15 DIAGNOSIS — M25571 Pain in right ankle and joints of right foot: Secondary | ICD-10-CM | POA: Insufficient documentation

## 2014-05-15 DIAGNOSIS — Z23 Encounter for immunization: Secondary | ICD-10-CM | POA: Insufficient documentation

## 2014-07-08 ENCOUNTER — Other Ambulatory Visit: Payer: Self-pay

## 2014-07-10 ENCOUNTER — Emergency Department (HOSPITAL_COMMUNITY): Payer: Self-pay

## 2014-07-10 ENCOUNTER — Encounter (HOSPITAL_COMMUNITY): Payer: Self-pay | Admitting: Emergency Medicine

## 2014-07-10 ENCOUNTER — Emergency Department (HOSPITAL_COMMUNITY)
Admission: EM | Admit: 2014-07-10 | Discharge: 2014-07-10 | Disposition: A | Discharge status: Home / Self Care | Payer: Self-pay | Attending: Emergency Medicine | Admitting: Emergency Medicine

## 2014-07-10 DIAGNOSIS — M545 Low back pain, unspecified: Secondary | ICD-10-CM | POA: Insufficient documentation

## 2014-07-10 DIAGNOSIS — Z87448 Personal history of other diseases of urinary system: Secondary | ICD-10-CM | POA: Insufficient documentation

## 2014-07-10 DIAGNOSIS — Z9889 Other specified postprocedural states: Secondary | ICD-10-CM | POA: Insufficient documentation

## 2014-07-10 DIAGNOSIS — E860 Dehydration: Secondary | ICD-10-CM | POA: Insufficient documentation

## 2014-07-10 DIAGNOSIS — E119 Type 2 diabetes mellitus without complications: Secondary | ICD-10-CM | POA: Insufficient documentation

## 2014-07-10 DIAGNOSIS — Z794 Long term (current) use of insulin: Secondary | ICD-10-CM | POA: Insufficient documentation

## 2014-07-10 DIAGNOSIS — M479 Spondylosis, unspecified: Secondary | ICD-10-CM | POA: Insufficient documentation

## 2014-07-10 DIAGNOSIS — R55 Syncope and collapse: Secondary | ICD-10-CM | POA: Insufficient documentation

## 2014-07-10 DIAGNOSIS — M19019 Primary osteoarthritis, unspecified shoulder: Secondary | ICD-10-CM | POA: Insufficient documentation

## 2014-07-10 DIAGNOSIS — Z79899 Other long term (current) drug therapy: Secondary | ICD-10-CM | POA: Insufficient documentation

## 2014-07-10 DIAGNOSIS — I10 Essential (primary) hypertension: Secondary | ICD-10-CM | POA: Insufficient documentation

## 2014-07-10 DIAGNOSIS — K219 Gastro-esophageal reflux disease without esophagitis: Secondary | ICD-10-CM | POA: Insufficient documentation

## 2014-07-10 LAB — BASIC METABOLIC PANEL
ANION GAP: 19 — AB (ref 5–15)
BUN: 18 mg/dL (ref 6–23)
CHLORIDE: 100 meq/L (ref 96–112)
CO2: 21 mEq/L (ref 19–32)
CREATININE: 2.38 mg/dL — AB (ref 0.50–1.35)
Calcium: 9.5 mg/dL (ref 8.4–10.5)
GFR calc non Af Amer: 30 mL/min — ABNORMAL LOW (ref >90)
GFR, EST AFRICAN AMERICAN: 35 mL/min — AB (ref >90)
Glucose, Bld: 164 mg/dL — ABNORMAL HIGH (ref 70–99)
Potassium: 3.5 mEq/L — ABNORMAL LOW (ref 3.7–5.3)
Sodium: 140 mEq/L (ref 137–147)

## 2014-07-10 LAB — CBC
HEMATOCRIT: 40.3 % (ref 39.0–52.0)
Hemoglobin: 13 g/dL (ref 13.0–17.0)
MCH: 27 pg (ref 26.0–34.0)
MCHC: 32.3 g/dL (ref 30.0–36.0)
MCV: 83.8 fL (ref 78.0–100.0)
Platelets: 246 10*3/uL (ref 150–400)
RBC: 4.81 MIL/uL (ref 4.22–5.81)
RDW: 15.2 % (ref 11.5–15.5)
WBC: 7 10*3/uL (ref 4.0–10.5)

## 2014-07-10 LAB — I-STAT CG4 LACTIC ACID, ED: Lactic Acid, Venous: 2.53 mmol/L — ABNORMAL HIGH (ref 0.5–2.2)

## 2014-07-10 LAB — I-STAT TROPONIN, ED: TROPONIN I, POC: 0.01 ng/mL (ref 0.00–0.08)

## 2014-07-10 LAB — CBG MONITORING, ED: Glucose-Capillary: 158 mg/dL — ABNORMAL HIGH (ref 70–99)

## 2014-07-10 MED ORDER — SODIUM CHLORIDE 0.9 % IV BOLUS (SEPSIS)
1000.0000 mL | Freq: Once | INTRAVENOUS | Status: AC
  Administered 2014-07-10: 1000 mL via INTRAVENOUS

## 2014-07-10 MED ORDER — OXYCODONE-ACETAMINOPHEN 5-325 MG PO TABS: 1.0000 | ORAL_TABLET | Freq: Four times a day (QID) | ORAL | Status: DC | PRN

## 2014-07-10 MED ORDER — OXYCODONE-ACETAMINOPHEN 5-325 MG PO TABS
2.0000 | ORAL_TABLET | Freq: Once | ORAL | Status: AC
  Administered 2014-07-10: 2 via ORAL
  Filled 2014-07-10: qty 2

## 2014-07-10 MED ORDER — DIAZEPAM 5 MG PO TABS
5.0000 mg | ORAL_TABLET | Freq: Once | ORAL | Status: AC
  Administered 2014-07-10: 5 mg via ORAL
  Filled 2014-07-10: qty 1

## 2014-07-10 NOTE — ED Notes (Signed)
Lactic acid results given to Dr. Mingo Amber by PBT Caryl Pina

## 2014-07-10 NOTE — Discharge Instructions (Signed)
Dehydration, Adult Dehydration is when you lose more fluids from the body than you take in. Vital organs like the kidneys, brain, and heart cannot function without a proper amount of fluids and salt. Any loss of fluids from the body can cause dehydration.  CAUSES   Vomiting.  Diarrhea.  Excessive sweating.  Excessive urine output.  Fever. SYMPTOMS  Mild dehydration  Thirst.  Dry lips.  Slightly dry mouth. Moderate dehydration  Very dry mouth.  Sunken eyes.  Skin does not bounce back quickly when lightly pinched and released.  Dark urine and decreased urine production.  Decreased tear production.  Headache. Severe dehydration  Very dry mouth.  Extreme thirst.  Rapid, weak pulse (more than 100 beats per minute at rest).  Cold hands and feet.  Not able to sweat in spite of heat and temperature.  Rapid breathing.  Blue lips.  Confusion and lethargy.  Difficulty being awakened.  Minimal urine production.  No tears. DIAGNOSIS  Your caregiver will diagnose dehydration based on your symptoms and your exam. Blood and urine tests will help confirm the diagnosis. The diagnostic evaluation should also identify the cause of dehydration. TREATMENT  Treatment of mild or moderate dehydration can often be done at home by increasing the amount of fluids that you drink. It is best to drink small amounts of fluid more often. Drinking too much at one time can make vomiting worse. Refer to the home care instructions below. Severe dehydration needs to be treated at the hospital where you will probably be given intravenous (IV) fluids that contain water and electrolytes. HOME CARE INSTRUCTIONS   Ask your caregiver about specific rehydration instructions.  Drink enough fluids to keep your urine clear or pale yellow.  Drink small amounts frequently if you have nausea and vomiting.  Eat as you normally do.  Avoid:  Foods or drinks high in sugar.  Carbonated  drinks.  Juice.  Extremely hot or cold fluids.  Drinks with caffeine.  Fatty, greasy foods.  Alcohol.  Tobacco.  Overeating.  Gelatin desserts.  Wash your hands well to avoid spreading bacteria and viruses.  Only take over-the-counter or prescription medicines for pain, discomfort, or fever as directed by your caregiver.  Ask your caregiver if you should continue all prescribed and over-the-counter medicines.  Keep all follow-up appointments with your caregiver. SEEK MEDICAL CARE IF:  You have abdominal pain and it increases or stays in one area (localizes).  You have a rash, stiff neck, or severe headache.  You are irritable, sleepy, or difficult to awaken.  You are weak, dizzy, or extremely thirsty. SEEK IMMEDIATE MEDICAL CARE IF:   You are unable to keep fluids down or you get worse despite treatment.  You have frequent episodes of vomiting or diarrhea.  You have blood or green matter (bile) in your vomit.  You have blood in your stool or your stool looks black and tarry.  You have not urinated in 6 to 8 hours, or you have only urinated a small amount of very dark urine.  You have a fever.  You faint. MAKE SURE YOU:   Understand these instructions.  Will watch your condition.  Will get help right away if you are not doing well or get worse. Document Released: 11/29/2005 Document Revised: 02/21/2012 Document Reviewed: 07/19/2011 ExitCare Patient Information 2015 [employer]. This information is not intended to replace advice given to you by your health care provider. Make sure you discuss any questions you have with your health care   provider.  

## 2014-07-10 NOTE — ED Provider Notes (Signed)
CSN: [id number]     Arrival date & time 07/10/14  1302 History   First MD Initiated Contact with Patient 07/10/14 1303     Chief Complaint  Patient presents with  . Near Syncope     (Consider location/radiation/quality/duration/timing/severity/associated sxs/prior Treatment) Patient is a 53 y.o. male presenting with near-syncope and syncope. The history is provided by the patient.  Near Syncope Pertinent negatives include no chest pain, no abdominal pain and no shortness of breath.  Loss of Consciousness Episode history:  Single Most recent episode:  Today Duration:  1 minute Timing:  Constant Progression:  Resolved Chronicity:  New Context: normal activity (walking outside)   Witnessed: yes   Relieved by:  Nothing Worsened by:  Nothing tried Associated symptoms: no chest pain, no fever, no shortness of breath and no vomiting     Past Medical History  Diagnosis Date  . Diabetes mellitus   . Hypertension   . Drug abuse     cocaine, marijuana abuse  . Chest pain     due to cocaine use  . Renal insufficiency   . GERD (gastroesophageal reflux disease)   . Arthritis     back and shoulders  . Achilles tendon rupture right   Past Surgical History  Procedure Laterality Date  . Cardiac catheterization  05.31.2012    showing patent coronaries with no visualized vasospasm and EF of 55-60 %  . Orbital fracture surgery Left 2-3 yrs ago  . Achilles tendon surgery Right 05/02/2014    Procedure: RIGHT ACHILLES TENDON REPAIR;  Surgeon: Javier Docker, MD;  Location: [facility];  Service: Orthopedics;  Laterality: Right;   No family history on file. History  Substance Use Topics  . Smoking status: Never Smoker   . Smokeless tobacco: Not on file  . Alcohol Use: Yes    Review of Systems  Constitutional: Negative for fever and chills.  Respiratory: Negative for cough and shortness of breath.   Cardiovascular: Positive for syncope and near-syncope. Negative for chest pain.   Gastrointestinal: Negative for vomiting and abdominal pain.  All other systems reviewed and are negative.     Allergies  Review of patient's allergies indicates no known allergies.  Home Medications   Prior to Admission medications   Medication Sig Start Date End Date Taking? Authorizing Provider  amLODipine (NORVASC) 10 MG tablet Take 10 mg by mouth every morning.     Historical Provider, MD  carvedilol (COREG) 25 MG tablet Take 25 mg by mouth every morning.    Historical Provider, MD  docusate sodium (COLACE) 100 MG capsule Take 1 capsule (100 mg total) by mouth 2 (two) times daily. 05/02/14   Dayna Barker. Bissell, PA-C  hydrOXYzine (ATARAX/VISTARIL) 25 MG tablet Take 25 mg by mouth 3 (three) times daily as needed for itching.    Historical Provider, MD  insulin NPH (HUMULIN N,NOVOLIN N) 100 UNIT/ML injection Inject 20 Units into the skin 2 (two) times daily.    Historical Provider, MD  lisinopril-hydrochlorothiazide (PRINZIDE,ZESTORETIC) 20-25 MG per tablet Take 1 tablet by mouth every morning.     Historical Provider, MD  methocarbamol (ROBAXIN) 500 MG tablet Take 1 tablet (500 mg total) by mouth every 6 (six) hours as needed for muscle spasms. 05/02/14   Dayna Barker. Bissell, PA-C  omeprazole (PRILOSEC) 20 MG capsule Take 20 mg by mouth daily.     Historical Provider, MD  oxyCODONE-acetaminophen (PERCOCET) 7.5-325 MG per tablet Take 1 tablet by mouth every 4 (four) hours as needed  for pain. 05/02/14   Dayna BarkerJaclyn M. Bissell, PA-C   BP 113/76  Pulse 72  Temp(Src) 98 F (36.7 C) (Oral)  Resp 18  SpO2 100% Physical Exam  Nursing note and vitals reviewed. Constitutional: He is oriented to person, place, and time. He appears well-developed and well-nourished. No distress.  HENT:  Head: Normocephalic and atraumatic.  Mouth/Throat: Oropharynx is clear and moist. No oropharyngeal exudate.  Eyes: EOM are normal. Pupils are equal, round, and reactive to light.  Neck: Normal range of motion. Neck  supple.  Cardiovascular: Normal rate and regular rhythm.  Exam reveals no friction rub.   No murmur heard. Pulmonary/Chest: Effort normal and breath sounds normal. No respiratory distress. He has no wheezes. He has no rales.  Abdominal: He exhibits no distension. There is no tenderness. There is no rebound.  Musculoskeletal: Normal range of motion. He exhibits no edema.  Neurological: He is alert and oriented to person, place, and time.  Skin: No rash noted. He is not diaphoretic.    ED Course  Procedures (including critical care time) Labs Review Labs Reviewed  CBG MONITORING, ED - Abnormal; Notable for the following:    Glucose-Capillary 158 (*)    All other components within normal limits  CBC  BASIC METABOLIC PANEL  I-STAT CG4 LACTIC ACID, ED  Rosezena SensorI-STAT TROPOININ, ED    Imaging Review Dg Chest 2 View  07/10/2014   CLINICAL DATA:  Syncope.  EXAM: CHEST  2 VIEW  COMPARISON:  November 06, 2013.  FINDINGS: The heart size and mediastinal contours are within normal limits. Both lungs are clear. No pneumothorax or pleural effusion is noted. The visualized skeletal structures are unremarkable.  IMPRESSION: No acute cardiopulmonary abnormality seen.   Electronically Signed   By: Roque LiasJames  Green M.D.   On: 07/10/2014 14:03     EKG Interpretation   Date/Time:  Wednesday July 10 2014 15:11:53 EDT Ventricular Rate:  80 PR Interval:  213 QRS Duration: 77 QT Interval:  393 QTC Calculation: 453 R Axis:   51 Text Interpretation:  Sinus rhythm Prolonged PR interval Probable left  atrial enlargement Borderline repolarization abnormality Similar to prior  Confirmed by Gwendolyn GrantWALDEN  MD, Ceola Para ([id number]) on 07/10/2014 3:15:53 PM      MDM   Final diagnoses:  Dehydration  Midline low back pain without sciatica    4M presents with syncope. Began while walking outside. No CP, SOB, N/V/D. Was walking, felt weak. Still feeling a little lightheaded here. Recent R foot surgery. AFVSS here. Exam with muscle  spasms of back, he states these are chronic. Belly benign, lungs clear, no CP. Will get orthostatics, give fluids, check labs. Mild lactate elevation. Feeling better after fluids, given percocet for his back pain.    Dagmar HaitWilliam Jaquala Fuller, MD 07/10/14 [id number]

## 2014-07-10 NOTE — ED Notes (Signed)
Patient presents today via EMS with a chief complaint of near syncope/dizziness that occurred this afternoon while walking along the street. Patient also reports mid back pain due to chrnonic muscle spasms, denies chest pain and shortness of breath

## 2014-07-15 ENCOUNTER — Ambulatory Visit: Payer: Self-pay | Admitting: Internal Medicine

## 2014-07-22 ENCOUNTER — Ambulatory Visit: Payer: Self-pay | Admitting: Internal Medicine

## 2014-08-28 ENCOUNTER — Encounter (HOSPITAL_COMMUNITY): Payer: Self-pay | Admitting: Emergency Medicine

## 2014-08-28 ENCOUNTER — Emergency Department (HOSPITAL_COMMUNITY)
Admission: EM | Admit: 2014-08-28 | Discharge: 2014-08-28 | Disposition: A | Discharge status: Home / Self Care | Payer: Self-pay | Attending: Emergency Medicine | Admitting: Emergency Medicine

## 2014-08-28 ENCOUNTER — Emergency Department (HOSPITAL_COMMUNITY): Payer: Self-pay

## 2014-08-28 DIAGNOSIS — Z79899 Other long term (current) drug therapy: Secondary | ICD-10-CM | POA: Insufficient documentation

## 2014-08-28 DIAGNOSIS — E119 Type 2 diabetes mellitus without complications: Secondary | ICD-10-CM | POA: Insufficient documentation

## 2014-08-28 DIAGNOSIS — M79671 Pain in right foot: Secondary | ICD-10-CM

## 2014-08-28 DIAGNOSIS — M7660 Achilles tendinitis, unspecified leg: Secondary | ICD-10-CM | POA: Insufficient documentation

## 2014-08-28 DIAGNOSIS — M79609 Pain in unspecified limb: Secondary | ICD-10-CM | POA: Insufficient documentation

## 2014-08-28 DIAGNOSIS — I10 Essential (primary) hypertension: Secondary | ICD-10-CM | POA: Insufficient documentation

## 2014-08-28 DIAGNOSIS — Z9889 Other specified postprocedural states: Secondary | ICD-10-CM | POA: Insufficient documentation

## 2014-08-28 DIAGNOSIS — M7661 Achilles tendinitis, right leg: Secondary | ICD-10-CM

## 2014-08-28 DIAGNOSIS — Z794 Long term (current) use of insulin: Secondary | ICD-10-CM | POA: Insufficient documentation

## 2014-08-28 MED ORDER — OXYCODONE-ACETAMINOPHEN 5-325 MG PO TABS: 1.0000 | ORAL_TABLET | ORAL | Status: DC | PRN

## 2014-08-28 MED ORDER — OXYCODONE-ACETAMINOPHEN 5-325 MG PO TABS
1.0000 | ORAL_TABLET | Freq: Once | ORAL | Status: AC
  Administered 2014-08-28: 1 via ORAL
  Filled 2014-08-28: qty 1

## 2014-08-28 MED ORDER — HYDROMORPHONE HCL 1 MG/ML IJ SOLN
1.0000 mg | Freq: Once | INTRAMUSCULAR | Status: AC
  Administered 2014-08-28: 1 mg via INTRAMUSCULAR
  Filled 2014-08-28: qty 1

## 2014-08-28 MED ORDER — ONDANSETRON 4 MG PO TBDP
8.0000 mg | ORAL_TABLET | Freq: Once | ORAL | Status: AC
  Administered 2014-08-28: 8 mg via ORAL
  Filled 2014-08-28: qty 2

## 2014-08-28 NOTE — Discharge Instructions (Signed)
Percocet for pain. Keep foot elevated. Ice. Follow up with your orthopedics surgeon. Return if worsening.

## 2014-08-28 NOTE — ED Notes (Signed)
Pt in c/o left foot pain that is chronic, has surgery on this foot in may and has had pain since, no wounds noted to foot, denies new injury, states he is out of his percocet and the naproxen he was given is not helping

## 2014-08-28 NOTE — ED Provider Notes (Signed)
CSN: [id number]     Arrival date & time 08/28/14  1459 History   First MD Initiated Contact with Patient 08/28/14 1609   This chart was scribed for non-physician practitioner Jaynie Crumble, PA-C, working with Rolland Porter, MD by Gwenevere Abbot, ED scribe. This patient was seen in room TR09C/TR09C and the patient's care was started at 4:12 PM.    Chief Complaint  Patient presents with  . Foot Pain   The history is provided by the patient. No language interpreter was used.   HPI Comments:  Ryan [name] is a 53 y.o. male who presents to the Emergency Department complaining of severe right foot pain. Pt reports that he had foot surgery in 04/2014 performed by Dr. Avel Peace in [place]. Pt reports that he began to experience gradually worsening pain, onset yesterday. Pt reports that he ran out of pain medication, and was told by Dr. Avel Peace to visit ED because of pain. Pt reports that he has an appointment scheduled in 2 weeks. Pt is unsure what caused pain, however the pain is similar to prior to when he had surgery. Pt reports that he was wearing a cam walker boot, and boot was removed one week ago. Past Medical History  Diagnosis Date  . Hypertension   . Diabetes mellitus without complication    History reviewed. No pertinent past surgical history. History reviewed. No pertinent family history. History  Substance Use Topics  . Smoking status: Never Smoker   . Smokeless tobacco: Not on file  . Alcohol Use: Yes     Comment: occassional    Review of Systems  Constitutional: Positive for fever.  Musculoskeletal: Positive for arthralgias and myalgias.      Allergies  Review of patient's allergies indicates no known allergies.  Home Medications   Prior to Admission medications   Medication Sig Start Date End Date Taking? Authorizing Provider  amLODipine (NORVASC) 10 MG tablet Take 10 mg by mouth daily.    Historical Provider, MD  carvedilol (COREG) 6.25 MG tablet Take 6.25 mg by  mouth 2 (two) times daily with a meal.    Historical Provider, MD  hydrOXYzine (ATARAX/VISTARIL) 25 MG tablet Take 25 mg by mouth at bedtime.    Historical Provider, MD  insulin NPH Human (HUMULIN N,NOVOLIN N) 100 UNIT/ML injection Inject 20 Units into the skin 2 (two) times daily before a meal.    Historical Provider, MD  ketoconazole (NIZORAL) 2 % cream Apply 1 application topically daily.    Historical Provider, MD  lisinopril-hydrochlorothiazide (PRINZIDE,ZESTORETIC) 20-25 MG per tablet Take 1 tablet by mouth daily.    Historical Provider, MD  methocarbamol (ROBAXIN) 500 MG tablet Take 500 mg by mouth every 6 (six) hours as needed for muscle spasms.    Historical Provider, MD  naproxen (NAPROSYN) 500 MG tablet Take 500 mg by mouth 2 (two) times daily with a meal.    Historical Provider, MD  omeprazole (PRILOSEC) 20 MG capsule Take 20 mg by mouth daily.    Historical Provider, MD  oxyCODONE-acetaminophen (PERCOCET/ROXICET) 5-325 MG per tablet Take 1 tablet by mouth every 4 (four) hours as needed for severe pain. 04/17/14   Sunnie Nielsen, MD   BP 152/96  Pulse 93  Temp(Src) 98 F (36.7 C) (Oral)  Resp 26  SpO2 99% Physical Exam  Nursing note and vitals reviewed. Constitutional: He appears well-developed and well-nourished. No distress.  Eyes: Conjunctivae are normal.  Musculoskeletal:  Swelling noted to the right ankle. Healed surgical incision to the  achilles. Tender diffusely over medial, lateral ankle and achilles tendon. Calf muscle soft. Unable to move ankle due to pain. DP pulses intact. Foot normal. Ankle joint does not feel warm to the touch. No bruising or discoloration  Skin: Skin is warm and dry.    ED Course  Procedures  DIAGNOSTIC STUDIES: Oxygen Saturation is 99% on RA, normal by my interpretation.  COORDINATION OF CARE:   Labs Review Labs Reviewed - No data to display  Imaging Review Dg Ankle Complete Right  08/28/2014   CLINICAL DATA:  Right ankle pain and history  of prior repair of Achilles tendon.  EXAM: RIGHT ANKLE - COMPLETE 3+ VIEW  COMPARISON:  04/17/2014  FINDINGS: No acute fracture or dislocation is identified. There are some progressive proliferative changes involving both lateral and medial malleoli since prior imaging. The ankle mortise shows normal alignment. Lateral view shows increased bony proliferation and fragmentation at the base of the Achilles tendon. There is soft tissue thickening posteriorly which may reflect postoperative changes versus inflammation. Correlation suggested with any evidence of recurrent Achilles injury.  IMPRESSION: No bony fracture. Increase in degenerative changes. Bony fragmentation at the Achilles insertion with soft tissue thickening posteriorly. This may reflect postoperative changes. Correlation suggested with any evidence of recurrent Achilles injury.   Electronically Signed   By: Irish Lack M.D.   On: 08/28/2014 17:26     EKG Interpretation None      MDM   Final diagnoses:  Achilles tendinitis of right lower extremity  Right foot pain    Patient with Achilles tendon rupture and repair 4 months ago. Present with increased pain. No injuries. No signs of infection. X-ray obtained and showing postsurgical changes, he has not had any new injuries, doubt rupture. No signs of infection. Compartments are soft. Neurovascularly intact. Patient did admit that he ran out of pain medicine 2 days ago, has been on Percocet since his surgery. I suspect there might be some withdrawal component. Patient received allotted with no improvement in pain. He then received a Percocet. He looks much better. Will discharge him home with ice, elevation, crutches for one to 2 days, Percocet for pain. I have instructed him to call his orthopedic surgeon and followup with them in the office.   Filed Vitals:   08/28/14 1520  BP: 152/96  Pulse: 93  Temp: 98 F (36.7 C)  TempSrc: Oral  Resp: 26  SpO2: 99%   I personally  performed the services described in this documentation, which was scribed in my presence. The recorded information has been reviewed and is accurate.     Lottie Mussel, PA-C 08/28/14 [phone]

## 2014-09-03 NOTE — ED Provider Notes (Signed)
Medical screening examination/treatment/procedure(s) were performed by non-physician practitioner and as supervising physician I was immediately available for consultation/collaboration.   EKG Interpretation None        Can Lucci, MD 09/03/14 1536 

## 2016-07-15 ENCOUNTER — Emergency Department (HOSPITAL_COMMUNITY)
Admission: EM | Admit: 2016-07-15 | Discharge: 2016-07-15 | Disposition: A | Discharge status: Home / Self Care | Payer: Self-pay | Attending: Emergency Medicine | Admitting: Emergency Medicine

## 2016-07-15 ENCOUNTER — Encounter (HOSPITAL_COMMUNITY): Payer: Self-pay | Admitting: *Deleted

## 2016-07-15 DIAGNOSIS — Z794 Long term (current) use of insulin: Secondary | ICD-10-CM | POA: Insufficient documentation

## 2016-07-15 DIAGNOSIS — I10 Essential (primary) hypertension: Secondary | ICD-10-CM | POA: Insufficient documentation

## 2016-07-15 DIAGNOSIS — E119 Type 2 diabetes mellitus without complications: Secondary | ICD-10-CM | POA: Insufficient documentation

## 2016-07-15 DIAGNOSIS — Z79899 Other long term (current) drug therapy: Secondary | ICD-10-CM | POA: Insufficient documentation

## 2016-07-15 DIAGNOSIS — K0889 Other specified disorders of teeth and supporting structures: Secondary | ICD-10-CM | POA: Insufficient documentation

## 2016-07-15 MED ORDER — PENICILLIN V POTASSIUM 500 MG PO TABS: 500.0000 mg | ORAL_TABLET | Freq: Four times a day (QID) | ORAL | 0 refills | Status: DC

## 2016-07-15 NOTE — Discharge Instructions (Signed)
You need to see a dentist.  If you do not have a dentist, you may contact the number provided.  Please take your antibiotics as directed.  Return here for difficulty breathing or swallowing.

## 2016-07-15 NOTE — ED Provider Notes (Signed)
[facility]-EMERGENCY DEPT Provider Note   CSN: [id number] Arrival date & time: 07/15/16  1401  First Provider Contact: 07/15/2016 2:34 PM   By signing my name below, I, Jasmyn B. Alexander, attest that this documentation has been prepared under the direction and in the presence of Roxy Horseman, PA-C. Electronically Signed: Gillis Ends. Lyn Hollingshead, ED Scribe. 07/15/16. 2:38 PM.  History   Chief Complaint Chief Complaint  Patient presents with  . Dental Pain    HPI HPI Comments: Ryan [name] is a 55 y.o. male with PMHx of DM and HTN who presents to the Emergency Department complaining of gradual worsening, constant, moderate right-sided lower dental pain x 1 day. Pt reports that he has a cracked tooth in the same area. Pt has associated facial swelling. Pt has taken Tylenol with no relief of pain. Pain is exacerbated with opening of the mouth. No primary dentist. Denies any fever or chills.  The history is provided by the patient. No language interpreter was used.   Past Medical History:  Diagnosis Date  . Diabetes mellitus without complication (HCC)   . Hypertension     There are no active problems to display for this patient.  History reviewed. No pertinent surgical history.  Home Medications    Prior to Admission medications   Medication Sig Start Date End Date Taking? Authorizing Provider  amLODipine (NORVASC) 10 MG tablet Take 10 mg by mouth daily.    Historical Provider, MD  carvedilol (COREG) 6.25 MG tablet Take 6.25 mg by mouth 2 (two) times daily with a meal.    Historical Provider, MD  hydrOXYzine (ATARAX/VISTARIL) 25 MG tablet Take 25 mg by mouth at bedtime.    Historical Provider, MD  insulin NPH Human (HUMULIN N,NOVOLIN N) 100 UNIT/ML injection Inject 20 Units into the skin 2 (two) times daily before a meal.    Historical Provider, MD  ketoconazole (NIZORAL) 2 % cream Apply 1 application topically daily.    Historical Provider, MD  lisinopril-hydrochlorothiazide  (PRINZIDE,ZESTORETIC) 20-25 MG per tablet Take 1 tablet by mouth daily.    Historical Provider, MD  methocarbamol (ROBAXIN) 500 MG tablet Take 500 mg by mouth every 6 (six) hours as needed for muscle spasms.    Historical Provider, MD  naproxen (NAPROSYN) 500 MG tablet Take 500 mg by mouth 2 (two) times daily with a meal.    Historical Provider, MD  omeprazole (PRILOSEC) 20 MG capsule Take 20 mg by mouth daily.    Historical Provider, MD  oxyCODONE-acetaminophen (PERCOCET) 5-325 MG per tablet Take 1 tablet by mouth every 4 (four) hours as needed for severe pain. 08/28/14   Tatyana Kirichenko, PA-C  oxyCODONE-acetaminophen (PERCOCET/ROXICET) 5-325 MG per tablet Take 1 tablet by mouth every 4 (four) hours as needed for severe pain. 04/17/14   Sunnie Nielsen, MD    Family History History reviewed. No pertinent family history.  Social History Social History  Substance Use Topics  . Smoking status: Never Smoker  . Smokeless tobacco: Not on file  . Alcohol use Yes     Comment: occassional     Allergies   Review of patient's allergies indicates no known allergies.   Review of Systems Review of Systems  Constitutional: Negative for chills and fever.  HENT: Positive for dental problem and facial swelling.   All other systems reviewed and are negative.  Physical Exam Updated Vital Signs BP (!) 175/101 (BP Location: Right Arm)   Pulse 75   Temp 98.7 F (37.1 C) (Oral)  Resp 18   SpO2 96%   Physical Exam Physical Exam  Constitutional: Pt appears well-developed and well-nourished.  HENT:  Head: Normocephalic.  Right Ear: Tympanic membrane, external ear and ear canal normal.  Left Ear: Tympanic membrane, external ear and ear canal normal.  Nose: Nose normal. Right sinus exhibits no maxillary sinus tenderness and no frontal sinus tenderness. Left sinus exhibits no maxillary sinus tenderness and no frontal sinus tenderness.  Mouth/Throat: Uvula is midline, oropharynx is clear and moist and  mucous membranes are normal. No oral lesions. No uvula swelling or lacerations. No oropharyngeal exudate, posterior oropharyngeal edema, posterior oropharyngeal erythema or tonsillar abscesses.  Poor dentition No gingival swelling, fluctuance or induration No gross abscess  No sublingual edema, tenderness to palpation, or sign of Ludwig's angina, or deep space infection Pain at right upper rear molars Eyes: Conjunctivae are normal. Pupils are equal, round, and reactive to light. Right eye exhibits no discharge. Left eye exhibits no discharge.  Neck: Normal range of motion. Neck supple.  No stridor Handling secretions without difficulty No nuchal rigidity No cervical lymphadenopathy Cardiovascular: Normal rate, regular rhythm and normal heart sounds.   Pulmonary/Chest: Effort normal. No respiratory distress.  Equal chest rise  Abdominal: Soft. Bowel sounds are normal. Pt exhibits no distension. There is no tenderness.  Lymphadenopathy: Pt has no cervical adenopathy.  Neurological: Pt is alert and oriented x 4  Skin: Skin is warm and dry.  Psychiatric: Pt has a normal mood and affect.  Nursing note and vitals reviewed.    ED Treatments / Results  DIAGNOSTIC STUDIES: Oxygen Saturation is 96% on RA, normal by my interpretation.    COORDINATION OF CARE: 2:3 PM-Discussed treatment plan which includes order of Veetid with pt at bedside and pt agreed to plan.   Procedures Procedures (including critical care time)  Initial Impression / Assessment and Plan / ED Course  I have reviewed the triage vital signs and the nursing notes.  Pertinent labs & imaging results that were available during my care of the patient were reviewed by me and considered in my medical decision making (see chart for details).  Clinical Course    Patient with dentalgia.  No abscess requiring immediate incision and drainage.  Exam not concerning for Ludwig's angina or pharyngeal abscess.  Will treat with  penicillin. Pt instructed to follow-up with dentist.  Discussed return precautions. Pt safe for discharge.   Final Clinical Impressions(s) / ED Diagnoses   Final diagnoses:  Pain, dental    New Prescriptions New Prescriptions   PENICILLIN V POTASSIUM (VEETID) 500 MG TABLET    Take 1 tablet (500 mg total) by mouth 4 (four) times daily.   I personally performed the services described in this documentation, which was scribed in my presence. The recorded information has been reviewed and is accurate.      Roxy Horseman, PA-C 07/15/16 1452    Shaune Pollack, MD 07/15/16 2109

## 2016-07-15 NOTE — ED Triage Notes (Signed)
Pt reports right side dental pain and swelling to right side of face x 2 days. No swelling into neck or throat. Denies fever. Airway intact.

## 2016-07-15 NOTE — ED Notes (Signed)
Patient able to ambulate independently  

## 2016-07-22 ENCOUNTER — Encounter (HOSPITAL_COMMUNITY): Payer: Self-pay | Admitting: Emergency Medicine

## 2016-10-19 ENCOUNTER — Encounter (HOSPITAL_COMMUNITY)
Admission: EM | Disposition: A | Discharge status: Home / Self Care | Payer: Self-pay | Source: Home / Self Care | Attending: Emergency Medicine

## 2016-10-19 ENCOUNTER — Encounter (HOSPITAL_COMMUNITY): Payer: Self-pay | Admitting: Anesthesiology

## 2016-10-19 ENCOUNTER — Emergency Department (HOSPITAL_COMMUNITY)
Admission: EM | Admit: 2016-10-19 | Discharge: 2016-10-19 | Disposition: A | Discharge status: Home / Self Care | Payer: Self-pay | Attending: Emergency Medicine | Admitting: Emergency Medicine

## 2016-10-19 ENCOUNTER — Encounter (HOSPITAL_COMMUNITY): Payer: Self-pay | Admitting: *Deleted

## 2016-10-19 DIAGNOSIS — W260XXA Contact with knife, initial encounter: Secondary | ICD-10-CM | POA: Insufficient documentation

## 2016-10-19 DIAGNOSIS — Z79899 Other long term (current) drug therapy: Secondary | ICD-10-CM | POA: Insufficient documentation

## 2016-10-19 DIAGNOSIS — Y999 Unspecified external cause status: Secondary | ICD-10-CM | POA: Insufficient documentation

## 2016-10-19 DIAGNOSIS — Y939 Activity, unspecified: Secondary | ICD-10-CM | POA: Insufficient documentation

## 2016-10-19 DIAGNOSIS — Z794 Long term (current) use of insulin: Secondary | ICD-10-CM | POA: Insufficient documentation

## 2016-10-19 DIAGNOSIS — E119 Type 2 diabetes mellitus without complications: Secondary | ICD-10-CM | POA: Insufficient documentation

## 2016-10-19 DIAGNOSIS — I10 Essential (primary) hypertension: Secondary | ICD-10-CM | POA: Insufficient documentation

## 2016-10-19 DIAGNOSIS — Z23 Encounter for immunization: Secondary | ICD-10-CM | POA: Insufficient documentation

## 2016-10-19 DIAGNOSIS — S61211A Laceration without foreign body of left index finger without damage to nail, initial encounter: Secondary | ICD-10-CM | POA: Insufficient documentation

## 2016-10-19 DIAGNOSIS — Y9289 Other specified places as the place of occurrence of the external cause: Secondary | ICD-10-CM | POA: Insufficient documentation

## 2016-10-19 LAB — CBC WITH DIFFERENTIAL/PLATELET
Basophils Absolute: 0 10*3/uL (ref 0.0–0.1)
Basophils Relative: 0 %
Eosinophils Absolute: 0.1 10*3/uL (ref 0.0–0.7)
Eosinophils Relative: 1 %
HCT: 42 % (ref 39.0–52.0)
Hemoglobin: 14.1 g/dL (ref 13.0–17.0)
Lymphocytes Relative: 40 %
Lymphs Abs: 2.9 10*3/uL (ref 0.7–4.0)
MCH: 27.9 pg (ref 26.0–34.0)
MCHC: 33.6 g/dL (ref 30.0–36.0)
MCV: 83 fL (ref 78.0–100.0)
Monocytes Absolute: 0.8 10*3/uL (ref 0.1–1.0)
Monocytes Relative: 12 %
Neutro Abs: 3.4 10*3/uL (ref 1.7–7.7)
Neutrophils Relative %: 47 %
Platelets: 228 10*3/uL (ref 150–400)
RBC: 5.06 MIL/uL (ref 4.22–5.81)
RDW: 16.4 % — ABNORMAL HIGH (ref 11.5–15.5)
WBC: 7.2 10*3/uL (ref 4.0–10.5)

## 2016-10-19 LAB — BASIC METABOLIC PANEL
ANION GAP: 10 (ref 5–15)
BUN: 19 mg/dL (ref 6–20)
CALCIUM: 9.2 mg/dL (ref 8.9–10.3)
CO2: 21 mmol/L — ABNORMAL LOW (ref 22–32)
Chloride: 106 mmol/L (ref 101–111)
Creatinine, Ser: 1.63 mg/dL — ABNORMAL HIGH (ref 0.61–1.24)
GFR, EST AFRICAN AMERICAN: 54 mL/min — AB (ref >60)
GFR, EST NON AFRICAN AMERICAN: 47 mL/min — AB (ref >60)
GLUCOSE: 144 mg/dL — AB (ref 65–99)
Potassium: 3.9 mmol/L (ref 3.5–5.1)
SODIUM: 137 mmol/L (ref 135–145)

## 2016-10-19 SURGERY — NERVE, TENDON AND ARTERY REPAIR
Anesthesia: General | Laterality: Right

## 2016-10-19 MED ORDER — TETANUS-DIPHTHERIA TOXOIDS TD 5-2 LFU IM INJ: 0.5000 mL | INJECTION | Freq: Once | INTRAMUSCULAR | Status: DC

## 2016-10-19 MED ORDER — BUPIVACAINE HCL 0.25 % IJ SOLN
10.0000 mL | Freq: Once | INTRAMUSCULAR | Status: AC
  Administered 2016-10-19: 10 mL
  Filled 2016-10-19: qty 10

## 2016-10-19 MED ORDER — BUPIVACAINE HCL 0.25 % IJ SOLN
30.0000 mL | Freq: Once | INTRAMUSCULAR | Status: DC
  Filled 2016-10-19: qty 30

## 2016-10-19 MED ORDER — LIDOCAINE HCL (PF) 1 % IJ SOLN
30.0000 mL | Freq: Once | INTRAMUSCULAR | Status: AC
  Administered 2016-10-19: 30 mL
  Filled 2016-10-19: qty 30

## 2016-10-19 MED ORDER — HYDROCODONE-ACETAMINOPHEN 5-325 MG PO TABS: 1.0000 | ORAL_TABLET | ORAL | 0 refills | Status: DC | PRN

## 2016-10-19 MED ORDER — CEFAZOLIN IN D5W 1 GM/50ML IV SOLN
1.0000 g | Freq: Once | INTRAVENOUS | Status: AC
  Administered 2016-10-19: 1 g via INTRAVENOUS
  Filled 2016-10-19: qty 50

## 2016-10-19 MED ORDER — TETANUS-DIPHTH-ACELL PERTUSSIS 5-2.5-18.5 LF-MCG/0.5 IM SUSP
0.5000 mL | Freq: Once | INTRAMUSCULAR | Status: AC
  Administered 2016-10-19: 0.5 mL via INTRAMUSCULAR
  Filled 2016-10-19: qty 0.5

## 2016-10-19 MED ORDER — HYDROMORPHONE HCL 2 MG/ML IJ SOLN
0.5000 mg | Freq: Once | INTRAMUSCULAR | Status: AC
Start: 2016-10-19 — End: 2016-10-19
  Administered 2016-10-19: 0.5 mg via INTRAVENOUS
  Filled 2016-10-19: qty 1

## 2016-10-19 NOTE — Consult Note (Signed)
Reason for Consult: Laceration left hand with nerve injury Referring Physician: ER staff  Montrice Gracey is an 55 y.o. male.  HPI: This 55 year old male status post injury left index finger with radial digital nerve and artery injury. He was seen by emergency room staff and referred for our evaluation and treatment.  He denies other injury process.  He is alert and oriented. He denies neck back chest or abdominal pain  Past Medical History:  Diagnosis Date  . Achilles tendon rupture right  . Arthritis    back and shoulders  . Chest pain    due to cocaine use  . Diabetes mellitus   . Diabetes mellitus without complication ([place])   . Drug abuse    cocaine, marijuana abuse  . GERD (gastroesophageal reflux disease)   . Hypertension   . Renal insufficiency     Past Surgical History:  Procedure Laterality Date  . ACHILLES TENDON SURGERY Right 05/02/2014   Procedure: RIGHT ACHILLES TENDON REPAIR;  Surgeon: Johnn Hai, MD;  Location: [facility];  Service: Orthopedics;  Laterality: Right;  . CARDIAC CATHETERIZATION  05.31.2012   showing patent coronaries with no visualized vasospasm and EF of 55-60 %  . ORBITAL FRACTURE SURGERY Left 2-3 yrs ago    No family history on file.  Social History:  reports that he has never smoked. He has never used smokeless tobacco. He reports that he drinks alcohol. He reports that he uses drugs, including Cocaine and Marijuana.  Allergies: No Known Allergies  Medications: I have reviewed the patient's current medications.  Results for orders placed or performed during the hospital encounter of 10/19/16 (from the past 48 hour(s))  CBC with Differential/Platelet     Status: Abnormal   Collection Time: 10/19/16  1:44 PM  Result Value Ref Range   WBC 7.2 4.0 - 10.5 K/uL   RBC 5.06 4.22 - 5.81 MIL/uL   Hemoglobin 14.1 13.0 - 17.0 g/dL   HCT 42.0 39.0 - 52.0 %   MCV 83.0 78.0 - 100.0 fL   MCH 27.9 26.0 - 34.0 pg   MCHC 33.6 30.0 - 36.0 g/dL   RDW  16.4 (H) 11.5 - 15.5 %   Platelets 228 150 - 400 K/uL   Neutrophils Relative % 47 %   Neutro Abs 3.4 1.7 - 7.7 K/uL   Lymphocytes Relative 40 %   Lymphs Abs 2.9 0.7 - 4.0 K/uL   Monocytes Relative 12 %   Monocytes Absolute 0.8 0.1 - 1.0 K/uL   Eosinophils Relative 1 %   Eosinophils Absolute 0.1 0.0 - 0.7 K/uL   Basophils Relative 0 %   Basophils Absolute 0.0 0.0 - 0.1 K/uL  Basic metabolic panel     Status: Abnormal   Collection Time: 10/19/16  1:44 PM  Result Value Ref Range   Sodium 137 135 - 145 mmol/L   Potassium 3.9 3.5 - 5.1 mmol/L   Chloride 106 101 - 111 mmol/L   CO2 21 (L) 22 - 32 mmol/L   Glucose, Bld 144 (H) 65 - 99 mg/dL   BUN 19 6 - 20 mg/dL   Creatinine, Ser 1.63 (H) 0.61 - 1.24 mg/dL   Calcium 9.2 8.9 - 10.3 mg/dL   GFR calc non Af Amer 47 (L) >60 mL/min   GFR calc Af Amer 54 (L) >60 mL/min    Comment: (NOTE) The eGFR has been calculated using the CKD EPI equation. This calculation has not been validated in all clinical situations. eGFR's persistently <60  mL/min signify possible Chronic Kidney Disease.    Anion gap 10 5 - 15    No results found.  Review of Systems  Constitutional: Negative.   Eyes: Negative.   Cardiovascular: Negative.   Gastrointestinal: Negative.   Genitourinary: Negative.   Skin: Negative.   Neurological: Negative.   Endo/Heme/Allergies: Negative.   Psychiatric/Behavioral: Negative.    Blood pressure 134/90, pulse 77, temperature 98.1 F (36.7 C), temperature source Oral, resp. rate 20, height 5' 9"  (1.753 m), weight 102.1 kg (225 lb), SpO2 100 %. Physical Exam Patient has a left index finger laceration with radial digital nerve and artery injury he appears to have intact sensation to the ulnar digital nerve and the FDP and FDS tendons are intact it appears on cursory exam  The patient is alert and oriented in no acute distress. The patient complains of pain in the affected upper extremity.  The patient is noted to have a normal  HEENT exam. Lung fields show equal chest expansion and no shortness of breath. Abdomen exam is nontender without distention. Lower extremity examination does not show any fracture dislocation or blood clot symptoms. Pelvis is stable and the neck and back are stable and nontender. Assessment/Plan: Left hand laceration with radial digital nerve and artery repair. We discussed with patient risks and benefits of surgery and we'll proceed accordingly.  We are planning surgery for your upper extremity. The risk and benefits of surgery to include risk of bleeding, infection, anesthesia,  damage to normal structures and failure of the surgery to accomplish its intended goals of relieving symptoms and restoring function have been discussed in detail. With this in mind we plan to proceed. I have specifically discussed with the patient the pre-and postoperative regime and the dos and don'ts and risk and benefits in great detail. Risk and benefits of surgery also include risk of dystrophy(CRPS), chronic nerve pain, failure of the healing process to go onto completion and other inherent risks of surgery The relavent the pathophysiology of the disease/injury process, as well as the alternatives for treatment and postoperative course of action has been discussed in great detail with the patient who desires to proceed.  We will do everything in our power to help you (the patient) restore function to the upper extremity. It is a pleasure to see this patient today.  Please see dictated op note. This patient underwent repair radial distal nerve and artery left hand with irrigation and debridement Patient tolerated the procedure well.  Discharge medicines Bactrim DS 1 by mouth twice a day 7 days Norco when necessary pain dispensed #30  He'll return to see [place] in 12 days at the office.  There is a pleasure to sprain his care. Once again our goal is protective sensation and prevention of a painful neuroma as opposed  to normal feeling and he understands this. Paulene Floor 10/19/2016, 5:45 PM

## 2016-10-19 NOTE — ED Notes (Signed)
Dr Fayrene FearingJames aware Dr Amanda PeaGramig requested for him to d/c pt and he will write rx's.

## 2016-10-19 NOTE — Progress Notes (Signed)
Orthopedic Tech Progress Note Patient Details:  Ryan [name] [birth year] [id number]   Gave Dr. Amanda PeaGramig supplies for Lt finger splint and short arm splint. Patient ID: Ryan Cobbsnthony Ibrahim, male   DOB: [birth year], 55 y.o.   MRN: [id number]   Clois Dupesvery S Jarelly Rinck 10/19/2016, 5:32 PM

## 2016-10-19 NOTE — Discharge Instructions (Signed)
All Dr. Carlos LeveringGramig's office to schedule an outpatient follow-up appointment

## 2016-10-19 NOTE — ED Notes (Signed)
Dr Amanda PeaGramig in w/pt performing procedure.

## 2016-10-19 NOTE — ED Notes (Signed)
Pt's sister - Celene KrasBeverly Clements - [phone]- [phone] - requested for pt to call her when pt is finished and she will call cab for pt. Advised her procedure is nearly complete.

## 2016-10-19 NOTE — ED Provider Notes (Signed)
[facility] Provider Note   CSN: [id number] Arrival date & time: 10/19/16  1149     History   Chief Complaint Chief Complaint  Patient presents with  . Extremity Laceration    HPI Ryan [name] is a 55 y.o. male. He reports using a kitchen knife to cut up some chicken. He has a laceration of his left index finger radially. Bleeding controlled with pressure. Decreased feeling to the distal aspect of the left ring finger radial.  HPI  Past Medical History:  Diagnosis Date  . Achilles tendon rupture right  . Arthritis    back and shoulders  . Chest pain    due to cocaine use  . Diabetes mellitus   . Diabetes mellitus without complication (HCC)   . Drug abuse    cocaine, marijuana abuse  . GERD (gastroesophageal reflux disease)   . Hypertension   . Renal insufficiency     Patient Active Problem List   Diagnosis Date Noted  . Right ankle pain 05/15/2014  . Need for Tdap vaccination 05/15/2014  . Unspecified essential hypertension 05/14/2014  . Diabetes mellitus (HCC) 05/14/2014  . Rupture of right Achilles tendon 05/02/2014  . Tendon rupture, Achilles 05/02/2014    Past Surgical History:  Procedure Laterality Date  . ACHILLES TENDON SURGERY Right 05/02/2014   Procedure: RIGHT ACHILLES TENDON REPAIR;  Surgeon: Javier DockerJeffrey C Beane, MD;  Location: [facility];  Service: Orthopedics;  Laterality: Right;  . CARDIAC CATHETERIZATION  05.31.2012   showing patent coronaries with no visualized vasospasm and EF of 55-60 %  . ORBITAL FRACTURE SURGERY Left 2-3 yrs ago       Home Medications    Prior to Admission medications   Medication Sig Start Date End Date Taking? Authorizing Provider  amLODipine (NORVASC) 10 MG tablet Take 10 mg by mouth every morning.     Historical Provider, MD  amLODipine (NORVASC) 10 MG tablet Take 10 mg by mouth daily.    Historical Provider, MD  carvedilol (COREG) 25 MG tablet Take 25 mg by mouth every morning.    Historical Provider, MD    carvedilol (COREG) 6.25 MG tablet Take 6.25 mg by mouth 2 (two) times daily with a meal.    Historical Provider, MD  docusate sodium (COLACE) 100 MG capsule Take 1 capsule (100 mg total) by mouth 2 (two) times daily. 05/02/14   Dorothy SparkJaclyn M Bissell, PA-C  hydrOXYzine (ATARAX/VISTARIL) 25 MG tablet Take 25 mg by mouth at bedtime.    Historical Provider, MD  insulin NPH (HUMULIN N,NOVOLIN N) 100 UNIT/ML injection Inject 20 Units into the skin 2 (two) times daily.    Historical Provider, MD  insulin NPH Human (HUMULIN N,NOVOLIN N) 100 UNIT/ML injection Inject 20 Units into the skin 2 (two) times daily before a meal.    Historical Provider, MD  ketoconazole (NIZORAL) 2 % cream Apply 1 application topically daily.    Historical Provider, MD  lisinopril-hydrochlorothiazide (PRINZIDE,ZESTORETIC) 20-25 MG per tablet Take 1 tablet by mouth every morning.     Historical Provider, MD  lisinopril-hydrochlorothiazide (PRINZIDE,ZESTORETIC) 20-25 MG per tablet Take 1 tablet by mouth daily.    Historical Provider, MD  methocarbamol (ROBAXIN) 500 MG tablet Take 500 mg by mouth every 6 (six) hours as needed for muscle spasms.    Historical Provider, MD  methocarbamol (ROBAXIN) 500 MG tablet Take 1 tablet (500 mg total) by mouth every 6 (six) hours as needed for muscle spasms. 05/02/14   Dorothy SparkJaclyn M Bissell, PA-C  naproxen (NAPROSYN)  500 MG tablet Take 500 mg by mouth 2 (two) times daily with a meal.    Historical Provider, MD  omeprazole (PRILOSEC) 20 MG capsule Take 20 mg by mouth daily.     Historical Provider, MD  omeprazole (PRILOSEC) 20 MG capsule Take 20 mg by mouth daily.    Historical Provider, MD  oxyCODONE-acetaminophen (PERCOCET) 5-325 MG per tablet Take 1 tablet by mouth every 4 (four) hours as needed for severe pain. 08/28/14   Tatyana Kirichenko, PA-C  oxyCODONE-acetaminophen (PERCOCET/ROXICET) 5-325 MG per tablet Take 1 tablet by mouth every 4 (four) hours as needed for severe pain. 04/17/14   Sunnie NielsenBrian Opitz, MD   oxyCODONE-acetaminophen (PERCOCET/ROXICET) 5-325 MG per tablet Take 1 tablet by mouth every 6 (six) hours as needed for severe pain. 07/10/14   Elwin MochaBlair Walden, MD  penicillin v potassium (VEETID) 500 MG tablet Take 1 tablet (500 mg total) by mouth 4 (four) times daily. 07/15/16   Roxy Horsemanobert Browning, PA-C    Family History No family history on file.  Social History Social History  Substance Use Topics  . Smoking status: Never Smoker  . Smokeless tobacco: Never Used  . Alcohol use Yes     Comment: occassional     Allergies   Patient has no known allergies.   Review of Systems Review of Systems  Skin:       Left index finger laceration. Radial aspect. First digit. First phalanx     Physical Exam Updated Vital Signs BP 150/93 (BP Location: Right Arm)   Pulse 96   Temp 98.1 F (36.7 C) (Oral)   Resp 16   Ht 5\' 9"  (1.753 m)   Wt 225 lb (102.1 kg)   SpO2 97%   BMI 33.23 kg/m   Physical Exam  Musculoskeletal:  Recent laceration left index finger P3 radial. He is normal sensation distally on the ulnar aspect. Radial aspect he has no sensation. Bleeding controlled with pressure. Excellent capillary refill distally. No additional digits involved. Full range of motion. Laceration does not approach the joint     ED Treatments / Results  Labs (all labs ordered are listed, but only abnormal results are displayed) Labs Reviewed  CBC WITH DIFFERENTIAL/PLATELET  BASIC METABOLIC PANEL    EKG  EKG Interpretation None       Radiology No results found.  Procedures Procedures (including critical care time)  Medications Ordered in ED Medications  ceFAZolin (ANCEF) IVPB 1 g/50 mL premix (not administered)  Tdap (BOOSTRIX) injection 0.5 mL (not administered)     Initial Impression / Assessment and Plan / ED Course  I have reviewed the triage vital signs and the nursing notes.  Pertinent labs & imaging results that were available during my care of the patient were  reviewed by me and considered in my medical decision making (see chart for details).  Clinical Course     Laceration with probable radial digital nerve injury.  Final Clinical Impressions(s) / ED Diagnoses   Final diagnoses:  Laceration of left index finger without damage to nail, foreign body presence unspecified, initial encounter    Discussed with PA for Dr. Cheree DittoGraham ache and hand surgery. He'll see the patient in the ER. IVs placed. Given IV Ancef. Will be kept nothing by mouth. He is diabetic. Preop labs and EKG obtained. Tetanus updated. Await surgical evaluation.  New Prescriptions New Prescriptions   No medications on file     Rolland PorterMark Jerico Grisso, MD 10/19/16 1343

## 2016-10-19 NOTE — ED Notes (Signed)
Pt given ice chips. Per ok Museum/gallery conservatorBecky RN

## 2016-10-19 NOTE — ED Triage Notes (Signed)
Pt states that he was cutting chicken today when he cut his left index finger. Pt has dressing applied from EMS. Pt unsure of last tetanus shot.

## 2016-10-19 NOTE — ED Notes (Signed)
Pt's sister called and advised she is calling her Joycelyn DasUncle Maurice to see if he can pick pt up. Pt and Dr Amanda PeaGramig aware.

## 2016-10-20 NOTE — Op Note (Signed)
NAMDia Sitter:  Clausen, Thadeus            ACCOUNT NO.:  [id number]  MEDICAL RECORD NO.:  [id number]  LOCATION:  C29C                         FACILITY:  [facility]  PHYSICIAN:  Dionne AnoWilliam M. Seydina Holliman, M.D.DATE OF BIRTH:  [birth year]  DATE OF PROCEDURE: DATE OF DISCHARGE:  10/19/2016                              OPERATIVE REPORT   PREOPERATIVE DIAGNOSES:  Laceration, left hand, with radial digital nerve and artery injury.  POSTOPERATIVE DIAGNOSIS:  Laceration, left hand, with radial digital nerve and artery injury.  PROCEDURE: 1. Irrigation and debridement of skin, subcutaneous tissue, paratenon,     and deep index finger left hand superficial and deep structures.     This was an excisional debridement with knife, blade, curette, and     scissor. 2. Radial digital nerve repair with 8-0 nylon, left index finger. 3. Radial digital artery repair with 8-0 nylon, left index finger. 4. Exploration flexor sheath, which was noted to be intact and stable     FDP and FDS tendons notable, left index finger and hand.  ASSISTANT:  Karie ChimeraBrian Buchanan, P.A.-C.  COMPLICATION:  None.  ANESTHESIA:  Wrist block anesthetic.  TOURNIQUET TIME:  Less than an hour.  INDICATIONS:  This is a 55 year old male, who presents for evaluation. The patient had significant laceration while cutting some chicken today. He has a radial digital nerve and artery injury.  We discussed with him exploration, repair, reconstruction as necessary.  I have discussed with him the risks and benefits, do's and don'ts, timeframe, duration of recovery, and other issues.  I have discussed with him meaningful expectations and the fact that our goal is to try to give him a finger that has protective sensation and no painful neuroma.  OPERATION IN DETAIL:  The patient was seen by myself, underwent a wrist block by myself without difficulty utilizing 20 mL of Sensorcaine, lidocaine without epinephrine.  Following this, he was prepped and draped in  usual sterile fashion with Betadine scrub x2.  He was then sterilely secured and we performed irrigation and debridement of skin, subcutaneous tissue, paratenon, and associated structures.  Large amounts of saline were placed in the wound.  He tolerated this well. There were no complicating features.  Flexor tendons were intact. Flexor tendon sheath was intact.  I then identified and tagged the radial digital nerve and artery visually.  This was done with 4.5 loupe magnification with light aid. Following this, we then prepared the area for repair.  With the tourniquet inflated, I coapted the nerve with 3 epineurial sutures.  I was able to achieve good coaptation without difficulty. Following this, I then coapted the artery in similar fashion utilizing a circumferential repair.  The patient tolerated this well.  Following this, tourniquet was deflated.  Hemostasis secured.  Wound closed with Prolene.  He was placed in a splint.  We will keep his finger in slight flexion.  Begin active range of motion.  I asked him to notify me should any problems occur and move forward accordingly.  All questions have been encouraged and answered.  Should any problems arise, he will notify us.  We will plan for active flexion, extension, to limit hyperextension at the MCP joint in the first 4  weeks.  Should any problems arise, he will notify us.  Once again, our goal is protective sensation and avoidance of a painful neuroma.  Pleasure to see him today and participate in his care plan.  Should any problems arise, we will be immediately available.  These notes have been discussed.  All questions have been addressed.     Dionne AnoWilliam M. Amanda PeaGramig, M.D.     [facility]/MEDQ  D:  10/19/2016  T:  10/20/2016  Job:  [id number]

## 2017-01-13 ENCOUNTER — Emergency Department (HOSPITAL_COMMUNITY)
Admission: EM | Admit: 2017-01-13 | Discharge: 2017-01-13 | Disposition: A | Discharge status: Home / Self Care | Payer: Self-pay | Attending: Emergency Medicine | Admitting: Emergency Medicine

## 2017-01-13 ENCOUNTER — Encounter (HOSPITAL_COMMUNITY): Payer: Self-pay

## 2017-01-13 DIAGNOSIS — M542 Cervicalgia: Secondary | ICD-10-CM

## 2017-01-13 DIAGNOSIS — Z794 Long term (current) use of insulin: Secondary | ICD-10-CM | POA: Insufficient documentation

## 2017-01-13 DIAGNOSIS — E119 Type 2 diabetes mellitus without complications: Secondary | ICD-10-CM | POA: Insufficient documentation

## 2017-01-13 DIAGNOSIS — M436 Torticollis: Secondary | ICD-10-CM | POA: Insufficient documentation

## 2017-01-13 DIAGNOSIS — I10 Essential (primary) hypertension: Secondary | ICD-10-CM | POA: Insufficient documentation

## 2017-01-13 DIAGNOSIS — Z79899 Other long term (current) drug therapy: Secondary | ICD-10-CM | POA: Insufficient documentation

## 2017-01-13 MED ORDER — NAPROXEN 500 MG PO TABS: 500.0000 mg | ORAL_TABLET | Freq: Two times a day (BID) | ORAL | 0 refills | Status: DC

## 2017-01-13 MED ORDER — ACETAMINOPHEN 500 MG PO TABS
1000.0000 mg | ORAL_TABLET | Freq: Once | ORAL | Status: AC
  Administered 2017-01-13: 1000 mg via ORAL
  Filled 2017-01-13: qty 2

## 2017-01-13 MED ORDER — METHOCARBAMOL 500 MG PO TABS: 500.0000 mg | ORAL_TABLET | Freq: Two times a day (BID) | ORAL | 0 refills | Status: DC

## 2017-01-13 MED ORDER — METHOCARBAMOL 500 MG PO TABS
500.0000 mg | ORAL_TABLET | Freq: Once | ORAL | Status: AC
  Administered 2017-01-13: 500 mg via ORAL
  Filled 2017-01-13: qty 1

## 2017-01-13 NOTE — Discharge Instructions (Signed)
Your symptoms are most consistent with torticollis or muscle spasms of your neck.  Please take robaxin + naproxen + 650 mg of tylenol up to three times a day (morning, afternoon and night) for pain relief and decrease inflammation.   Your symptoms should start to improve in 2-4 days.

## 2017-01-13 NOTE — ED Provider Notes (Signed)
[facility] Provider Note   CSN: [id number] Arrival date & time: 01/13/17  1218  By signing my name below, I, Rosario AdieWilliam Andrew Hiatt, attest that this documentation has been prepared under the direction and in the presence of Sharen Hecklaudia Nesiah Jump, PA-C.  Electronically Signed: Rosario AdieWilliam Andrew Hiatt, ED Scribe. 01/13/17. 1:17 PM.  History   Chief Complaint Chief Complaint  Patient presents with  . Neck Pain   The history is provided by the patient. No language interpreter was used.    HPI Comments: Ryan [name] is a 56 y.o. male with a h/o DM, GERD, and drug abuse, who presents to the Emergency Department complaining of acute onset persistent left-sided neck pain/stiffness beginning yesterday at 1800. Pt reports that he was sitting during the onset of his pain, and had not previously woken up from sleeping prior to the onset of his pain. He describes his pain as tight and notes radiation of his pain into his left shoulder. Pt states he has similar back pain associated with spasm. No recent trauma, fall, or injury to precipitate his pain. No treatments for his pain were tried prior to coming into the ED. His pain is exacerbated with ROM of the neck, especially with rotation towards the left, and alleviated with sitting still. He denies fever, or any other associated symptoms.   Past Medical History:  Diagnosis Date  . Achilles tendon rupture right  . Arthritis    back and shoulders  . Chest pain    due to cocaine use  . Diabetes mellitus   . Diabetes mellitus without complication (HCC)   . Drug abuse    cocaine, marijuana abuse  . GERD (gastroesophageal reflux disease)   . Hypertension   . Renal insufficiency    Patient Active Problem List   Diagnosis Date Noted  . Right ankle pain 05/15/2014  . Need for Tdap vaccination 05/15/2014  . Unspecified essential hypertension 05/14/2014  . Diabetes mellitus (HCC) 05/14/2014  . Rupture of right Achilles tendon 05/02/2014  . Tendon  rupture, Achilles 05/02/2014   Past Surgical History:  Procedure Laterality Date  . ACHILLES TENDON SURGERY Right 05/02/2014   Procedure: RIGHT ACHILLES TENDON REPAIR;  Surgeon: Javier DockerJeffrey C Beane, MD;  Location: [facility];  Service: Orthopedics;  Laterality: Right;  . CARDIAC CATHETERIZATION  05.31.2012   showing patent coronaries with no visualized vasospasm and EF of 55-60 %  . ORBITAL FRACTURE SURGERY Left 2-3 yrs ago    Home Medications    Prior to Admission medications   Medication Sig Start Date End Date Taking? Authorizing Provider  amLODipine (NORVASC) 10 MG tablet Take 10 mg by mouth every morning.     Historical Provider, MD  carvedilol (COREG) 25 MG tablet Take 25 mg by mouth 2 (two) times daily.     Historical Provider, MD  docusate sodium (COLACE) 100 MG capsule Take 1 capsule (100 mg total) by mouth 2 (two) times daily. Patient not taking: Reported on 10/19/2016 05/02/14   Dorothy SparkJaclyn M Bissell, PA-C  HYDROcodone-acetaminophen (NORCO/VICODIN) 5-325 MG tablet Take 1 tablet by mouth every 4 (four) hours as needed. 10/19/16   Rolland PorterMark James, MD  insulin NPH (HUMULIN N,NOVOLIN N) 100 UNIT/ML injection Inject 20 Units into the skin 2 (two) times daily.    Historical Provider, MD  lisinopril-hydrochlorothiazide (PRINZIDE,ZESTORETIC) 20-25 MG per tablet Take 1 tablet by mouth every morning.     Historical Provider, MD  methocarbamol (ROBAXIN) 500 MG tablet Take 1 tablet (500 mg total) by mouth 2 (two)  times daily. 01/13/17   Ryan Handy, PA-C  naproxen (NAPROSYN) 500 MG tablet Take 1 tablet (500 mg total) by mouth 2 (two) times daily. 01/13/17   Ryan Handy, PA-C  oxyCODONE-acetaminophen (PERCOCET) 5-325 MG per tablet Take 1 tablet by mouth every 4 (four) hours as needed for severe pain. Patient not taking: Reported on 10/19/2016 08/28/14   Jaynie Crumble, PA-C  oxyCODONE-acetaminophen (PERCOCET/ROXICET) 5-325 MG per tablet Take 1 tablet by mouth every 4 (four) hours as needed for severe  pain. Patient not taking: Reported on 10/19/2016 04/17/14   Sunnie Nielsen, MD  oxyCODONE-acetaminophen (PERCOCET/ROXICET) 5-325 MG per tablet Take 1 tablet by mouth every 6 (six) hours as needed for severe pain. Patient not taking: Reported on 10/19/2016 07/10/14   Elwin Mocha, MD  penicillin v potassium (VEETID) 500 MG tablet Take 1 tablet (500 mg total) by mouth 4 (four) times daily. Patient not taking: Reported on 10/19/2016 07/15/16   Roxy Horseman, PA-C   Family History No family history on file.  Social History Social History  Substance Use Topics  . Smoking status: Never Smoker  . Smokeless tobacco: Never Used  . Alcohol use Yes     Comment: occassional   Allergies   Patient has no known allergies.  Review of Systems Review of Systems  Constitutional: Negative for chills and fever.  HENT: Negative for congestion and sore throat.   Eyes: Negative for visual disturbance.  Respiratory: Negative for cough, chest tightness and shortness of breath.   Cardiovascular: Negative for chest pain.  Gastrointestinal: Negative for abdominal pain, constipation, diarrhea, nausea and vomiting.  Genitourinary: Negative for decreased urine volume and difficulty urinating.  Musculoskeletal: Positive for neck pain and neck stiffness. Negative for arthralgias and joint swelling.  Skin: Negative for rash.  Neurological: Negative for dizziness, light-headedness and headaches.   Physical Exam Updated Vital Signs BP 155/92   Pulse 97   Temp 98.9 F (37.2 C) (Oral)   Resp 18   SpO2 99%   Physical Exam  Constitutional: He is oriented to person, place, and time. He appears well-developed and well-nourished. No distress.  NAD.  HENT:  Head: Normocephalic and atraumatic.  Right Ear: External ear normal.  Left Ear: External ear normal.  Nose: Nose normal.  Mouth/Throat: Oropharynx is clear and moist. No oropharyngeal exudate.  Moist mucous membranes.  No nasal mucosa edema. Oropharynx and  tonsils pink without erythema, edema, exudates or lesions.  Uvula midline. No trismus.   Eyes: Conjunctivae and EOM are normal. Pupils are equal, round, and reactive to light. No scleral icterus.  Neck: Neck supple. No JVD present. Muscular tenderness present. No tracheal deviation present.  There is tenderness at left sided paraspinal cervical musculature and left side of the base of the skull with increased muscle tone. There is full neck ROM with reported tenderness with neck flexion, extension, and left rotation.   Cardiovascular: Normal rate, regular rhythm, normal heart sounds and intact distal pulses.   No murmur heard. Pulmonary/Chest: Effort normal and breath sounds normal. He has no wheezes.  Abdominal: Soft. He exhibits no distension. There is no tenderness.  Musculoskeletal: Normal range of motion. He exhibits no deformity.  Lymphadenopathy:    He has no cervical adenopathy.  Neurological: He is alert and oriented to person, place, and time.  Skin: Skin is warm and dry. Capillary refill takes less than 2 seconds.  Psychiatric: He has a normal mood and affect. His behavior is normal. Judgment and thought content normal.  Nursing note and vitals reviewed.  ED Treatments / Results  DIAGNOSTIC STUDIES: Oxygen Saturation is 99% on RA, normal by my interpretation.   COORDINATION OF CARE: 1:17 PM-Discussed next steps with pt. Pt verbalized understanding and is agreeable with the plan.   Labs (all labs ordered are listed, but only abnormal results are displayed) Labs Reviewed - No data to display  EKG  EKG Interpretation None      Radiology No results found.  Procedures Procedures  Medications Ordered in ED Medications  methocarbamol (ROBAXIN) tablet 500 mg (500 mg Oral Given 01/13/17 1345)  acetaminophen (TYLENOL) tablet 1,000 mg (1,000 mg Oral Given 01/13/17 1345)   Initial Impression / Assessment and Plan / ED Course  I have reviewed the triage vital signs and the  nursing notes.  Pertinent labs & imaging results that were available during my care of the patient were reviewed by me and considered in my medical decision making (see chart for details).     56 year old male presents with left-sided neck and trapezius sharp pain and tightness onset yesterday. No previous trauma or precipitating factors.  Pt reports h/o back spasms, which feels similar to current neck pain. On exam there is left cervical paraspinal tenderness with increased muscular tone consistent with muscular spasms. Full neck range of motion. No midline cervical spine tenderness. Vital signs reassuring, no fever.  No carotid bruits.  No eye complaints. No headache.  Symptoms most consistent with torticollis.  Doubt cervical spine acute injury or unstable fracture. Patient will be discharged with naproxen and Robaxin, RICE. ED return instructions given.  Pt agreeable to discharge plan.   Final Clinical Impressions(s) / ED Diagnoses   Final diagnoses:  Torticollis  Neck pain   New Prescriptions Discharge Medication List as of 01/13/2017  1:36 PM    START taking these medications   Details  naproxen (NAPROSYN) 500 MG tablet Take 1 tablet (500 mg total) by mouth 2 (two) times daily., Starting Thu 01/13/2017, Print       I personally performed the services described in this documentation, which was scribed in my presence. The recorded information has been reviewed and is accurate.    Ryan Handy, PA-C 01/13/17 1358    Alvira Monday, MD 01/16/17 [phone]

## 2017-01-13 NOTE — ED Triage Notes (Signed)
Patient complains of left sided neck pain with radiation to top of shoulder x 1 day with any movement. Denies trauma, hasnt taken any medication for same.

## 2017-05-15 ENCOUNTER — Emergency Department (HOSPITAL_BASED_OUTPATIENT_CLINIC_OR_DEPARTMENT_OTHER)
Admit: 2017-05-15 | Discharge: 2017-05-15 | Disposition: A | Discharge status: Home / Self Care | Payer: Self-pay | Attending: Emergency Medicine | Admitting: Emergency Medicine

## 2017-05-15 ENCOUNTER — Emergency Department (HOSPITAL_COMMUNITY)
Admission: EM | Admit: 2017-05-15 | Discharge: 2017-05-15 | Disposition: A | Discharge status: Home / Self Care | Payer: Self-pay | Attending: Emergency Medicine | Admitting: Emergency Medicine

## 2017-05-15 ENCOUNTER — Encounter (HOSPITAL_COMMUNITY): Payer: Self-pay

## 2017-05-15 ENCOUNTER — Emergency Department (HOSPITAL_COMMUNITY): Payer: Self-pay

## 2017-05-15 DIAGNOSIS — E1129 Type 2 diabetes mellitus with other diabetic kidney complication: Secondary | ICD-10-CM | POA: Insufficient documentation

## 2017-05-15 DIAGNOSIS — R609 Edema, unspecified: Secondary | ICD-10-CM | POA: Insufficient documentation

## 2017-05-15 DIAGNOSIS — Z794 Long term (current) use of insulin: Secondary | ICD-10-CM | POA: Insufficient documentation

## 2017-05-15 DIAGNOSIS — N289 Disorder of kidney and ureter, unspecified: Secondary | ICD-10-CM | POA: Insufficient documentation

## 2017-05-15 DIAGNOSIS — Z79899 Other long term (current) drug therapy: Secondary | ICD-10-CM | POA: Insufficient documentation

## 2017-05-15 DIAGNOSIS — M7989 Other specified soft tissue disorders: Secondary | ICD-10-CM

## 2017-05-15 DIAGNOSIS — I10 Essential (primary) hypertension: Secondary | ICD-10-CM | POA: Insufficient documentation

## 2017-05-15 LAB — COMPREHENSIVE METABOLIC PANEL
ALBUMIN: 3.4 g/dL — AB (ref 3.5–5.0)
ALK PHOS: 69 U/L (ref 38–126)
ALT: 23 U/L (ref 17–63)
AST: 27 U/L (ref 15–41)
Anion gap: 8 (ref 5–15)
BILIRUBIN TOTAL: 0.6 mg/dL (ref 0.3–1.2)
BUN: 15 mg/dL (ref 6–20)
CALCIUM: 8.8 mg/dL — AB (ref 8.9–10.3)
CO2: 22 mmol/L (ref 22–32)
Chloride: 104 mmol/L (ref 101–111)
Creatinine, Ser: 1.57 mg/dL — ABNORMAL HIGH (ref 0.61–1.24)
GFR calc Af Amer: 56 mL/min — ABNORMAL LOW (ref >60)
GFR calc non Af Amer: 49 mL/min — ABNORMAL LOW (ref >60)
GLUCOSE: 175 mg/dL — AB (ref 65–99)
POTASSIUM: 3.8 mmol/L (ref 3.5–5.1)
Sodium: 134 mmol/L — ABNORMAL LOW (ref 135–145)
TOTAL PROTEIN: 6.7 g/dL (ref 6.5–8.1)

## 2017-05-15 LAB — CBC WITH DIFFERENTIAL/PLATELET
BASOS ABS: 0 10*3/uL (ref 0.0–0.1)
Basophils Relative: 0 %
EOS PCT: 2 %
Eosinophils Absolute: 0.1 10*3/uL (ref 0.0–0.7)
HCT: 38.6 % — ABNORMAL LOW (ref 39.0–52.0)
Hemoglobin: 12.7 g/dL — ABNORMAL LOW (ref 13.0–17.0)
LYMPHS PCT: 37 %
Lymphs Abs: 2.5 10*3/uL (ref 0.7–4.0)
MCH: 26.7 pg (ref 26.0–34.0)
MCHC: 32.9 g/dL (ref 30.0–36.0)
MCV: 81.3 fL (ref 78.0–100.0)
MONO ABS: 0.9 10*3/uL (ref 0.1–1.0)
MONOS PCT: 13 %
Neutro Abs: 3.3 10*3/uL (ref 1.7–7.7)
Neutrophils Relative %: 48 %
PLATELETS: 190 10*3/uL (ref 150–400)
RBC: 4.75 MIL/uL (ref 4.22–5.81)
RDW: 17 % — AB (ref 11.5–15.5)
WBC: 6.9 10*3/uL (ref 4.0–10.5)

## 2017-05-15 LAB — URINALYSIS, ROUTINE W REFLEX MICROSCOPIC
BILIRUBIN URINE: NEGATIVE
GLUCOSE, UA: NEGATIVE mg/dL
HGB URINE DIPSTICK: NEGATIVE
KETONES UR: NEGATIVE mg/dL
LEUKOCYTES UA: NEGATIVE
Nitrite: NEGATIVE
Protein, ur: NEGATIVE mg/dL
Specific Gravity, Urine: 1.009 (ref 1.005–1.030)
pH: 5 (ref 5.0–8.0)

## 2017-05-15 MED ORDER — FUROSEMIDE 20 MG PO TABS: 20.0000 mg | ORAL_TABLET | Freq: Every day | ORAL | 0 refills | Status: DC

## 2017-05-15 MED ORDER — ONDANSETRON HCL 4 MG/2ML IJ SOLN
4.0000 mg | Freq: Once | INTRAMUSCULAR | Status: AC
  Administered 2017-05-15: 4 mg via INTRAVENOUS
  Filled 2017-05-15: qty 2

## 2017-05-15 MED ORDER — MORPHINE SULFATE (PF) 4 MG/ML IV SOLN
4.0000 mg | Freq: Once | INTRAVENOUS | Status: AC
  Administered 2017-05-15: 4 mg via INTRAVENOUS
  Filled 2017-05-15: qty 1

## 2017-05-15 MED ORDER — FUROSEMIDE 10 MG/ML IJ SOLN
20.0000 mg | Freq: Once | INTRAMUSCULAR | Status: AC
  Administered 2017-05-15: 20 mg via INTRAVENOUS
  Filled 2017-05-15: qty 2

## 2017-05-15 NOTE — Progress Notes (Addendum)
VASCULAR LAB PRELIMINARY  PRELIMINARY  PRELIMINARY  PRELIMINARY  Bilateral lower extremity venous duplex completed.    Preliminary report:  There is no DVT or SVT noted in the bilateral lower extremities.  Gave report to Dr. Trude McburneyHaviland  Eshan Trupiano, [facility] HospitalCANDACE, RVT 05/15/2017, 7:06 PM

## 2017-05-15 NOTE — ED Notes (Signed)
Patient transported to X-ray 

## 2017-05-15 NOTE — ED Provider Notes (Signed)
[facility]-EMERGENCY DEPT Provider Note   CSN: [id number] Arrival date & time: 05/15/17  1440     History   Chief Complaint Chief Complaint  Patient presents with  . Leg Swelling    HPI Ryan [name] is a 56 y.o. male.  Pt presents to the ED today with bilateral leg swelling.  Sx started yesterday.  Pt denies sob or cp.  He has never had leg swelling in the past.  He denies any prior diuretic use.       Past Medical History:  Diagnosis Date  . Achilles tendon rupture right  . Arthritis    back and shoulders  . Chest pain    due to cocaine use  . Diabetes mellitus   . Diabetes mellitus without complication (HCC)   . Drug abuse    cocaine, marijuana abuse  . GERD (gastroesophageal reflux disease)   . Hypertension   . Renal insufficiency     Patient Active Problem List   Diagnosis Date Noted  . Right ankle pain 05/15/2014  . Need for Tdap vaccination 05/15/2014  . Unspecified essential hypertension 05/14/2014  . Diabetes mellitus (HCC) 05/14/2014  . Rupture of right Achilles tendon 05/02/2014  . Tendon rupture, Achilles 05/02/2014    Past Surgical History:  Procedure Laterality Date  . ACHILLES TENDON SURGERY Right 05/02/2014   Procedure: RIGHT ACHILLES TENDON REPAIR;  Surgeon: Javier Docker, MD;  Location: [facility];  Service: Orthopedics;  Laterality: Right;  . CARDIAC CATHETERIZATION  05.31.2012   showing patent coronaries with no visualized vasospasm and EF of 55-60 %  . ORBITAL FRACTURE SURGERY Left 2-3 yrs ago       Home Medications    Prior to Admission medications   Medication Sig Start Date End Date Taking? Authorizing Provider  amLODipine (NORVASC) 10 MG tablet Take 10 mg by mouth every morning.     [provider]  carvedilol (COREG) 25 MG tablet Take 25 mg by mouth 2 (two) times daily.     [provider]  docusate sodium (COLACE) 100 MG capsule Take 1 capsule (100 mg total) by mouth 2 (two) times daily. Patient not taking:  Reported on 10/19/2016 05/02/14   Dorothy Spark, PA-C  furosemide (LASIX) 20 MG tablet Take 1 tablet (20 mg total) by mouth daily. 05/15/17   Jacalyn Lefevre, MD  HYDROcodone-acetaminophen (NORCO/VICODIN) 5-325 MG tablet Take 1 tablet by mouth every 4 (four) hours as needed. 10/19/16   Rolland Porter, MD  insulin NPH (HUMULIN N,NOVOLIN N) 100 UNIT/ML injection Inject 20 Units into the skin 2 (two) times daily.    [provider]  lisinopril-hydrochlorothiazide (PRINZIDE,ZESTORETIC) 20-25 MG per tablet Take 1 tablet by mouth every morning.     [provider]  methocarbamol (ROBAXIN) 500 MG tablet Take 1 tablet (500 mg total) by mouth 2 (two) times daily. 01/13/17   Liberty Handy, PA-C  naproxen (NAPROSYN) 500 MG tablet Take 1 tablet (500 mg total) by mouth 2 (two) times daily. 01/13/17   Liberty Handy, PA-C  oxyCODONE-acetaminophen (PERCOCET) 5-325 MG per tablet Take 1 tablet by mouth every 4 (four) hours as needed for severe pain. Patient not taking: Reported on 10/19/2016 08/28/14   Jaynie Crumble, PA-C  oxyCODONE-acetaminophen (PERCOCET/ROXICET) 5-325 MG per tablet Take 1 tablet by mouth every 4 (four) hours as needed for severe pain. Patient not taking: Reported on 10/19/2016 04/17/14   Sunnie Nielsen, MD  oxyCODONE-acetaminophen (PERCOCET/ROXICET) 5-325 MG per tablet Take 1 tablet by mouth every  6 (six) hours as needed for severe pain. Patient not taking: Reported on 10/19/2016 07/10/14   Elwin MochaWalden, Blair, MD  penicillin v potassium (VEETID) 500 MG tablet Take 1 tablet (500 mg total) by mouth 4 (four) times daily. Patient not taking: Reported on 10/19/2016 07/15/16   Roxy HorsemanBrowning, Robert, PA-C    Family History No family history on file.  Social History Social History  Substance Use Topics  . Smoking status: Never Smoker  . Smokeless tobacco: Never Used  . Alcohol use Yes     Comment: occassional     Allergies   Patient has no known allergies.   Review of Systems Review  of Systems  Musculoskeletal:       Leg swelling  All other systems reviewed and are negative.    Physical Exam Updated Vital Signs BP 128/86   Pulse 60   Temp 99 F (37.2 C) (Oral)   Resp 18   Ht 5\' 9"  (1.753 m)   Wt 108.9 kg (240 lb)   SpO2 94%   BMI 35.44 kg/m   Physical Exam  Constitutional: He is oriented to person, place, and time. He appears well-developed and well-nourished.  HENT:  Head: Normocephalic and atraumatic.  Right Ear: External ear normal.  Left Ear: External ear normal.  Nose: Nose normal.  Mouth/Throat: Oropharynx is clear and moist.  Eyes: Conjunctivae and EOM are normal. Pupils are equal, round, and reactive to light.  Neck: Normal range of motion. Neck supple.  Cardiovascular: Normal rate, regular rhythm, normal heart sounds and intact distal pulses.   Pulmonary/Chest: Effort normal and breath sounds normal.  Abdominal: Soft. Bowel sounds are normal.  Musculoskeletal: He exhibits edema.  Neurological: He is alert and oriented to person, place, and time.  Skin: Skin is warm.  Psychiatric: He has a normal mood and affect. His behavior is normal. Judgment and thought content normal.  Nursing note and vitals reviewed.    ED Treatments / Results  Labs (all labs ordered are listed, but only abnormal results are displayed) Labs Reviewed  COMPREHENSIVE METABOLIC PANEL - Abnormal; Notable for the following:       Result Value   Sodium 134 (*)    Glucose, Bld 175 (*)    Creatinine, Ser 1.57 (*)    Calcium 8.8 (*)    Albumin 3.4 (*)    GFR calc non Af Amer 49 (*)    GFR calc Af Amer 56 (*)    All other components within normal limits  CBC WITH DIFFERENTIAL/PLATELET - Abnormal; Notable for the following:    Hemoglobin 12.7 (*)    HCT 38.6 (*)    RDW 17.0 (*)    All other components within normal limits  URINALYSIS, ROUTINE W REFLEX MICROSCOPIC - Abnormal; Notable for the following:    Color, Urine STRAW (*)    All other components within normal  limits    EKG  EKG Interpretation  Date/Time:  Sunday May 15 2017 16:09:37 EDT Ventricular Rate:  65 PR Interval:    QRS Duration: 81 QT Interval:  388 QTC Calculation: 404 R Axis:   52 Text Interpretation:  Sinus rhythm Borderline repolarization abnormality Confirmed by Jacalyn LefevreHaviland, Gavina Dildine [phone]([id number]) on 05/15/2017 4:17:26 PM       Radiology Dg Chest 2 View  Result Date: 05/15/2017 CLINICAL DATA:  Leg swelling EXAM: CHEST  2 VIEW COMPARISON:  07/10/2014 FINDINGS: Normal heart size and mediastinal contours. No acute infiltrate or edema. No effusion or pneumothorax. Prominent thoracolumbar spondylitic spurring. No  acute osseous findings. EKG leads create artifact over the chest. IMPRESSION: Negative chest. Electronically Signed   By: Marnee Spring M.D.   On: 05/15/2017 16:59    Procedures Procedures (including critical care time)  Medications Ordered in ED Medications  furosemide (LASIX) injection 20 mg (20 mg Intravenous Given 05/15/17 1638)  morphine 4 MG/ML injection 4 mg (4 mg Intravenous Given 05/15/17 1637)  ondansetron (ZOFRAN) injection 4 mg (4 mg Intravenous Given 05/15/17 1637)     Initial Impression / Assessment and Plan / ED Course  I have reviewed the triage vital signs and the nursing notes.  Pertinent labs & imaging results that were available during my care of the patient were reviewed by me and considered in my medical decision making (see chart for details).    Date of Service: 05/15/2017 7:06 PM Kern Alberta, RVS  Vascular Lab    [] Hide copied text [] Hover for attribution information VASCULAR LAB PRELIMINARY  PRELIMINARY  PRELIMINARY  PRELIMINARY  Bilateral lower extremity venous duplex completed.    Preliminary report:  There is no DVT or SVT noted in the bilateral lower extremities.       Pt will be d/c with lasix.  He knows to return if worse.  Final Clinical Impressions(s) / ED Diagnoses   Final diagnoses:  Peripheral edema    New  Prescriptions New Prescriptions   FUROSEMIDE (LASIX) 20 MG TABLET    Take 1 tablet (20 mg total) by mouth daily.     Jacalyn Lefevre, MD 05/15/17 [phone]

## 2017-05-15 NOTE — ED Notes (Signed)
Pt aware of need for urine  

## 2017-05-15 NOTE — ED Triage Notes (Signed)
Per Pt, Pt is coming from home with complaints of bilateral leg swelling that started yesterday. Denies any Chest pain or SOB.

## 2017-10-09 ENCOUNTER — Emergency Department (HOSPITAL_COMMUNITY): Payer: Self-pay

## 2017-10-09 ENCOUNTER — Emergency Department (HOSPITAL_COMMUNITY)
Admission: EM | Admit: 2017-10-09 | Discharge: 2017-10-09 | Disposition: A | Discharge status: Home / Self Care | Payer: Self-pay | Attending: Emergency Medicine | Admitting: Emergency Medicine

## 2017-10-09 ENCOUNTER — Encounter (HOSPITAL_COMMUNITY): Payer: Self-pay

## 2017-10-09 DIAGNOSIS — Z79899 Other long term (current) drug therapy: Secondary | ICD-10-CM | POA: Insufficient documentation

## 2017-10-09 DIAGNOSIS — M25561 Pain in right knee: Secondary | ICD-10-CM | POA: Insufficient documentation

## 2017-10-09 DIAGNOSIS — I10 Essential (primary) hypertension: Secondary | ICD-10-CM | POA: Insufficient documentation

## 2017-10-09 DIAGNOSIS — E119 Type 2 diabetes mellitus without complications: Secondary | ICD-10-CM | POA: Insufficient documentation

## 2017-10-09 DIAGNOSIS — Z794 Long term (current) use of insulin: Secondary | ICD-10-CM | POA: Insufficient documentation

## 2017-10-09 MED ORDER — LISINOPRIL 20 MG PO TABS
20.0000 mg | ORAL_TABLET | Freq: Once | ORAL | Status: AC
  Administered 2017-10-09: 20 mg via ORAL
  Filled 2017-10-09: qty 1

## 2017-10-09 MED ORDER — AMLODIPINE BESYLATE 5 MG PO TABS
10.0000 mg | ORAL_TABLET | Freq: Once | ORAL | Status: AC
  Administered 2017-10-09: 10 mg via ORAL
  Filled 2017-10-09: qty 2

## 2017-10-09 MED ORDER — NAPROXEN 500 MG PO TABS: 500.0000 mg | ORAL_TABLET | Freq: Two times a day (BID) | ORAL | 0 refills | Status: DC

## 2017-10-09 MED ORDER — OXYCODONE-ACETAMINOPHEN 5-325 MG PO TABS
1.0000 | ORAL_TABLET | Freq: Once | ORAL | Status: AC
  Administered 2017-10-09: 1 via ORAL
  Filled 2017-10-09: qty 1

## 2017-10-09 NOTE — ED Triage Notes (Signed)
Patient complains of left knee pain with swelling x 2 days, denies trauma.  Pain with ambulation

## 2017-10-09 NOTE — ED Provider Notes (Signed)
[facility] EMERGENCY DEPARTMENT Provider Note   CSN: [id number] Arrival date & time: 10/09/17  1201     History   Chief Complaint No chief complaint on file.   HPI Ryan [name] is a 56 y.o. male with a past medical history of diabetes, hypertension who presents to ED for evaluation of left knee pain and swelling for the past 2 days. He states that he was walking and he heard a pop in his knee. He states he is continuing to hear the pop every time he walks. He has been ambulatory with pain since incident. He also reports mild swelling. He has not tried any medications to help with symptoms. He denies any previous fracture, dislocation or procedure in the area. He denies any redness of the joint, warmth or joint, fevers, numbness in legs, falls.  HPI  Past Medical History:  Diagnosis Date  . Achilles tendon rupture right  . Arthritis    back and shoulders  . Chest pain    due to cocaine use  . Diabetes mellitus   . Diabetes mellitus without complication (HCC)   . Drug abuse (HCC)    cocaine, marijuana abuse  . GERD (gastroesophageal reflux disease)   . Hypertension   . Renal insufficiency     Patient Active Problem List   Diagnosis Date Noted  . Right ankle pain 05/15/2014  . Need for Tdap vaccination 05/15/2014  . Unspecified essential hypertension 05/14/2014  . Diabetes mellitus (HCC) 05/14/2014  . Rupture of right Achilles tendon 05/02/2014  . Tendon rupture, Achilles 05/02/2014    Past Surgical History:  Procedure Laterality Date  . ACHILLES TENDON SURGERY Right 05/02/2014   Procedure: RIGHT ACHILLES TENDON REPAIR;  Surgeon: Javier Docker, MD;  Location: [facility];  Service: Orthopedics;  Laterality: Right;  . CARDIAC CATHETERIZATION  05.31.2012   showing patent coronaries with no visualized vasospasm and EF of 55-60 %  . ORBITAL FRACTURE SURGERY Left 2-3 yrs ago       Home Medications    Prior to Admission medications   Medication  Sig Start Date End Date Taking? Authorizing Provider  amLODipine (NORVASC) 10 MG tablet Take 10 mg by mouth every morning.     [provider]  carvedilol (COREG) 25 MG tablet Take 25 mg by mouth 2 (two) times daily.     [provider]  docusate sodium (COLACE) 100 MG capsule Take 1 capsule (100 mg total) by mouth 2 (two) times daily. Patient not taking: Reported on 10/19/2016 05/02/14   Dorothy Spark, PA-C  furosemide (LASIX) 20 MG tablet Take 1 tablet (20 mg total) by mouth daily. 05/15/17   Jacalyn Lefevre, MD  HYDROcodone-acetaminophen (NORCO/VICODIN) 5-325 MG tablet Take 1 tablet by mouth every 4 (four) hours as needed. 10/19/16   Rolland Porter, MD  insulin NPH (HUMULIN N,NOVOLIN N) 100 UNIT/ML injection Inject 20 Units into the skin 2 (two) times daily.    [provider]  lisinopril-hydrochlorothiazide (PRINZIDE,ZESTORETIC) 20-25 MG per tablet Take 1 tablet by mouth every morning.     [provider]  methocarbamol (ROBAXIN) 500 MG tablet Take 1 tablet (500 mg total) by mouth 2 (two) times daily. 01/13/17   Liberty Handy, PA-C  naproxen (NAPROSYN) 500 MG tablet Take 1 tablet (500 mg total) by mouth 2 (two) times daily. 10/09/17   Timira Bieda, PA-C  oxyCODONE-acetaminophen (PERCOCET) 5-325 MG per tablet Take 1 tablet by mouth every 4 (four) hours as needed for severe pain.  Patient not taking: Reported on 10/19/2016 08/28/14   Jaynie Crumble, PA-C  oxyCODONE-acetaminophen (PERCOCET/ROXICET) 5-325 MG per tablet Take 1 tablet by mouth every 4 (four) hours as needed for severe pain. Patient not taking: Reported on 10/19/2016 04/17/14   Sunnie Nielsen, MD  oxyCODONE-acetaminophen (PERCOCET/ROXICET) 5-325 MG per tablet Take 1 tablet by mouth every 6 (six) hours as needed for severe pain. Patient not taking: Reported on 10/19/2016 07/10/14   Elwin Mocha, MD  penicillin v potassium (VEETID) 500 MG tablet Take 1 tablet (500 mg total) by mouth 4 (four) times  daily. Patient not taking: Reported on 10/19/2016 07/15/16   Roxy Horseman, PA-C    Family History No family history on file.  Social History Social History  Substance Use Topics  . Smoking status: Never Smoker  . Smokeless tobacco: Never Used  . Alcohol use Yes     Comment: occassional     Allergies   Patient has no known allergies.   Review of Systems Review of Systems  Constitutional: Negative for chills and fever.  Musculoskeletal: Positive for arthralgias, gait problem and joint swelling. Negative for myalgias.  Skin: Negative for color change and rash.  Neurological: Negative for weakness and numbness.     Physical Exam Updated Vital Signs BP (!) 155/107 (BP Location: Left Arm)   Pulse 85   Temp 98.6 F (37 C) (Oral)   Resp 18   Ht 5\' 9"  (1.753 m)   Wt 108.9 kg (240 lb)   SpO2 98%   BMI 35.44 kg/m   Physical Exam  Constitutional: He appears well-developed and well-nourished. No distress.  HENT:  Head: Normocephalic and atraumatic.  Eyes: Conjunctivae and EOM are normal. No scleral icterus.  Neck: Normal range of motion.  Pulmonary/Chest: Effort normal. No respiratory distress.  Musculoskeletal: Normal range of motion. He exhibits edema and tenderness. He exhibits no deformity.  Tenderness to palpation of the left knee with very mild edema noted. Full active and passive range of motion of joints. 2+ DP pulse noted. Sensation intact to light touch in bilateral lower extremities. Strength 5/5 in bilateral lower extremities.  Neurological: He is alert.  Skin: No rash noted. He is not diaphoretic.  Psychiatric: He has a normal mood and affect.  Nursing note and vitals reviewed.    ED Treatments / Results  Labs (all labs ordered are listed, but only abnormal results are displayed) Labs Reviewed - No data to display  EKG  EKG Interpretation None       Radiology Dg Knee Complete 4 Views Left  Result Date: 10/09/2017 CLINICAL DATA:  Left knee  pain and swelling for 2 days. No known injury. EXAM: LEFT KNEE - COMPLETE 4+ VIEW COMPARISON:  02/01/2006 FINDINGS: No evidence of acute fracture or dislocation. Probable small knee joint effusion noted. Mild tricompartmental osteoarthritis is demonstrated. IMPRESSION: No evidence of fracture. Mild tricompartmental osteoarthritis, and probable small knee joint effusion. Electronically Signed   By: Myles Rosenthal M.D.   On: 10/09/2017 13:54    Procedures Procedures (including critical care time)  Medications Ordered in ED Medications  lisinopril (PRINIVIL,ZESTRIL) tablet 20 mg (not administered)  amLODipine (NORVASC) tablet 10 mg (10 mg Oral Given 10/09/17 1230)  oxyCODONE-acetaminophen (PERCOCET/ROXICET) 5-325 MG per tablet 1 tablet (1 tablet Oral Given 10/09/17 1230)     Initial Impression / Assessment and Plan / ED Course  I have reviewed the triage vital signs and the nursing notes.  Pertinent labs & imaging results that were available during my care  of the patient were reviewed by me and considered in my medical decision making (see chart for details).     Patient presents to ED for evaluation of left knee pain for the past 2 days. He reports pain worse with ambulation and states that he can hear a pop every time he tries to walk. No previous fracture, dislocation or procedure in the area. On physical exam patient does have tenderness to palpation around the left patella with mild edema noted. There is no color or temperature change noted. He has full active and passive range of motion of the knee. Patient hypertensive here in the ED and states that he has not taken his blood pressure medication this morning. We'll reassess after giving blood pressure medication and analgesics here and obtaining xray.  1415: blood pressure improved on recheck. X-rays returned as negative for acute abnormality but did show arthritic changes. This is likely the cause of the patient's symptoms rather than septic  joint or other infectious process. Will discharge with anti-inflammatories, knee sleeve and orthopedic follow-up. Advised to elevate and ice knee as tolerated. Patient appears stable for discharge at this time. Strict return precautions given.  Final Clinical Impressions(s) / ED Diagnoses   Final diagnoses:  Acute pain of right knee    New Prescriptions New Prescriptions   NAPROXEN (NAPROSYN) 500 MG TABLET    Take 1 tablet (500 mg total) by mouth 2 (two) times daily.     Dietrich PatesKhatri, Elson Ulbrich, PA-C 10/09/17 1420    Mancel BaleWentz, Elliott, MD 10/09/17 2024

## 2017-10-09 NOTE — ED Triage Notes (Signed)
Pt and family up dated on wait time for knee x-ray.

## 2017-10-09 NOTE — Discharge Instructions (Signed)
Please read attached information regarding your condition and Rice therapy. Take naproxen as needed for pain and inflammation. Were knee sleeve as directed. Follow-up with orthopedics for further evaluation. Return to ED for worsening pain, redness or warmth of the joint, fevers, numbness in legs, fall or Injuries.

## 2017-11-22 ENCOUNTER — Emergency Department (HOSPITAL_COMMUNITY): Payer: Self-pay

## 2017-11-22 ENCOUNTER — Emergency Department (HOSPITAL_COMMUNITY)
Admission: EM | Admit: 2017-11-22 | Discharge: 2017-11-22 | Disposition: A | Discharge status: Home / Self Care | Payer: Self-pay | Attending: Emergency Medicine | Admitting: Emergency Medicine

## 2017-11-22 ENCOUNTER — Encounter (HOSPITAL_COMMUNITY): Payer: Self-pay | Admitting: Emergency Medicine

## 2017-11-22 DIAGNOSIS — Z794 Long term (current) use of insulin: Secondary | ICD-10-CM | POA: Insufficient documentation

## 2017-11-22 DIAGNOSIS — Z79899 Other long term (current) drug therapy: Secondary | ICD-10-CM | POA: Insufficient documentation

## 2017-11-22 DIAGNOSIS — I10 Essential (primary) hypertension: Secondary | ICD-10-CM | POA: Insufficient documentation

## 2017-11-22 DIAGNOSIS — E119 Type 2 diabetes mellitus without complications: Secondary | ICD-10-CM | POA: Insufficient documentation

## 2017-11-22 DIAGNOSIS — M25562 Pain in left knee: Secondary | ICD-10-CM | POA: Insufficient documentation

## 2017-11-22 MED ORDER — OXYCODONE HCL 5 MG PO TABS
5.0000 mg | ORAL_TABLET | Freq: Once | ORAL | Status: AC
  Administered 2017-11-22: 5 mg via ORAL
  Filled 2017-11-22: qty 1

## 2017-11-22 MED ORDER — TRAMADOL HCL 50 MG PO TABS: 50.0000 mg | ORAL_TABLET | Freq: Three times a day (TID) | ORAL | 0 refills | Status: DC | PRN

## 2017-11-22 NOTE — Discharge Instructions (Signed)
It was my pleasure taking care of you today!   Ibuprofen as needed for mild to moderate pain. Tramadol only as needed for severe pain. Use crutches as needed for comfort. Ice and elevate knee throughout the day.  Call the orthopedist listed today or tomorrow to schedule follow up appointment for recheck of ongoing knee pain in one to two weeks. That appointment can be canceled with a 24-48 hour notice if complete resolution of pain.  Return to the ER for new or worsening symptoms, any additional concerns.

## 2017-11-22 NOTE — Progress Notes (Signed)
Orthopedic Tech Progress Note Patient Details:  Ryan [name] 05-27-1961 [id number]  Ortho Devices Type of Ortho Device: Knee Immobilizer, Crutches Ortho Device/Splint Interventions: Application   Post Interventions Patient Tolerated: Well Instructions Provided: Care of device, Adjustment of device   Saul FordyceJennifer C Adalaya Irion 11/22/2017, 5:05 PM

## 2017-11-22 NOTE — ED Provider Notes (Signed)
[facility] EM[facility]ENCY [facility] Provider Note   CSN: [id number] Arrival date & time: 11/22/17  1526     History   Chief Complaint Chief Complaint  Patient presents with  . Knee Pain    HPI Ryan [name] is a 56 y.o. male.  The history is provided by the patient and medical records. No language interpreter was used.  Knee Pain   Pertinent negatives include no numbness.   Ryan [name] is a 56 y.o. male  with a PMH of DM, HTN, GERD who presents to the Emergency Department complaining of acute set of left knee pain after fall last night.  Patient states that he slipped on ice and backwards, landing on his knee oddly.  He has been able to bear weight, but with a limp and exacerbation of pain.  He endorses associated left knee swelling.  He does have a history of "bone on bone arthritis" to the affected knee.  He has taken ibuprofen at home with no improvement.  No numbness, tingling or open wounds.  He is not followed by orthopedist.   Past Medical History:  Diagnosis Date  . Achilles tendon rupture right  . Arthritis    back and shoulders  . Chest pain    due to cocaine use  . Diabetes mellitus   . Diabetes mellitus without complication (HCC)   . Drug abuse (HCC)    cocaine, marijuana abuse  . GERD (gastroesophageal reflux disease)   . Hypertension   . Renal insufficiency     Patient Active Problem List   Diagnosis Date Noted  . Right ankle pain 05/15/2014  . Need for Tdap vaccination 05/15/2014  . Unspecified essential hypertension 05/14/2014  . Diabetes mellitus (HCC) 05/14/2014  . Rupture of right Achilles tendon 05/02/2014  . Tendon rupture, Achilles 05/02/2014    Past Surgical History:  Procedure Laterality Date  . ACHILLES TENDON SURGERY Right 05/02/2014   Procedure: RIGHT ACHILLES TENDON REPAIR;  Surgeon: Javier DockerJeffrey C Beane, MD;  Location: [facility];  Service: Orthopedics;  Laterality: Right;  . CARDIAC CATHETERIZATION  05.31.2012   showing patent coronaries with no visualized vasospasm and EF of 55-60 %  . ORBITAL FRACTURE SURGERY Left 2-3 yrs ago       Home Medications    Prior to Admission medications   Medication Sig Start Date End Date Taking? Authorizing Provider  amLODipine (NORVASC) 10 MG tablet Take 10 mg by mouth every morning.     [provider]  carvedilol (COREG) 25 MG tablet Take 25 mg by mouth 2 (two) times daily.     [provider]  docusate sodium (COLACE) 100 MG capsule Take 1 capsule (100 mg total) by mouth 2 (two) times daily. Patient not taking: Reported on 10/19/2016 05/02/14   Dorothy SparkBissell, Jaclyn M, PA-C  furosemide (LASIX) 20 MG tablet Take 1 tablet (20 mg total) by mouth daily. 05/15/17   Jacalyn LefevreHaviland, Julie, MD  HYDROcodone-acetaminophen (NORCO/VICODIN) 5-325 MG tablet Take 1 tablet by mouth every 4 (four) hours as needed. 10/19/16   Rolland PorterJames, Mark, MD  insulin NPH (HUMULIN N,NOVOLIN N) 100 UNIT/ML injection Inject 20 Units into the skin 2 (two) times daily.    [provider]  lisinopril-hydrochlorothiazide (PRINZIDE,ZESTORETIC) 20-25 MG per tablet Take 1 tablet by mouth every morning.     [provider]  methocarbamol (ROBAXIN) 500 MG tablet Take 1 tablet (500 mg total) by mouth 2 (two) times daily. 01/13/17   Liberty HandyGibbons, Claudia J, PA-C  naproxen (NAPROSYN) 500  MG tablet Take 1 tablet (500 mg total) by mouth 2 (two) times daily. 10/09/17   Khatri, Hina, PA-C  oxyCODONE-acetaminophen (PERCOCET) 5-325 MG per tablet Take 1 tablet by mouth every 4 (four) hours as needed for severe pain. Patient not taking: Reported on 10/19/2016 08/28/14   Jaynie Crumble, PA-C  oxyCODONE-acetaminophen (PERCOCET/ROXICET) 5-325 MG per tablet Take 1 tablet by mouth every 4 (four) hours as needed for severe pain. Patient not taking: Reported on 10/19/2016 04/17/14   Sunnie Nielsen, MD  oxyCODONE-acetaminophen (PERCOCET/ROXICET) 5-325 MG per tablet Take 1 tablet by mouth every 6 (six) hours as needed  for severe pain. Patient not taking: Reported on 10/19/2016 07/10/14   Elwin Mocha, MD  penicillin v potassium (VEETID) 500 MG tablet Take 1 tablet (500 mg total) by mouth 4 (four) times daily. Patient not taking: Reported on 10/19/2016 07/15/16   Roxy Horseman, PA-C  traMADol (ULTRAM) 50 MG tablet Take 1 tablet (50 mg total) by mouth every 8 (eight) hours as needed. 11/22/17   Ward, Chase Picket, PA-C    Family History No family history on file.  Social History Social History   Tobacco Use  . Smoking status: Never Smoker  . Smokeless tobacco: Never Used  Substance Use Topics  . Alcohol use: Yes    Comment: occassional  . Drug use: Yes    Types: Cocaine, Marijuana    Comment: last cocaine use 2 to 3 yrs ago, last marijuana use 4 to 5 years ago     Allergies   Patient has no known allergies.   Review of Systems Review of Systems  Gastrointestinal: Negative for abdominal pain.  Musculoskeletal: Positive for arthralgias. Negative for back pain.  Skin: Negative for wound.  Neurological: Negative for weakness and numbness.     Physical Exam Updated Vital Signs BP 134/77 (BP Location: Left Arm)   Pulse 85   Temp 98 F (36.7 C) (Oral)   Resp 16   Ht 5\' 9"  (1.753 m)   Wt 108.9 kg (240 lb)   SpO2 97%   BMI 35.44 kg/m   Physical Exam  Constitutional: He appears well-developed and well-nourished. No distress.  HENT:  Head: Normocephalic and atraumatic.  Neck: Neck supple.  Cardiovascular: Normal rate, regular rhythm and normal heart sounds.  No murmur heard. Pulmonary/Chest: Effort normal and breath sounds normal. No respiratory distress. He has no wheezes. He has no rales.  Musculoskeletal: Normal range of motion.  Left knee with tenderness to palpation of LCL / lateral knee. Decreased ROM 2/2 pain. + swelling. No abnormal alignment or patellar mobility. No bruising, erythema or warmth overlaying the joint. Negative drawer's, Lachman's and McMurray's. 2+ DP pulses  bilaterally. All compartments are soft. Sensation intact distal to injury. No hip tenderness. No midline C/T/L spine tenderness.  Neurological: He is alert.  Skin: Skin is warm and dry.  Nursing note and vitals reviewed.    ED Treatments / Results  Labs (all labs ordered are listed, but only abnormal results are displayed) Labs Reviewed - No data to display  EKG  EKG Interpretation None       Radiology Dg Knee Complete 4 Views Left  Result Date: 11/22/2017 CLINICAL DATA:  Larey Seat last night with left knee pain and swelling EXAM: LEFT KNEE - COMPLETE 4+ VIEW COMPARISON:  Left knee films of 10/09/2017 FINDINGS: There is moderate tricompartmental degenerative joint disease of the left knee involving all 3 compartments. No fracture is seen. There does appear to be a moderate size left  knee joint effusion present. Considerable spurring from the tibial plateau was noted on the lateral view. IMPRESSION: 1. Moderate size left knee joint effusion. 2. Tricompartmental degenerative joint disease of the left knee. No fracture. Electronically Signed   By: Dwyane DeePaul  Barry M.D.   On: 11/22/2017 16:26    Procedures Procedures (including critical care time)  Medications Ordered in ED Medications  oxyCODONE (Oxy IR/ROXICODONE) immediate release tablet 5 mg (not administered)     Initial Impression / Assessment and Plan / ED Course  I have reviewed the triage vital signs and the nursing notes.  Pertinent labs & imaging results that were available during my care of the patient were reviewed by me and considered in my medical decision making (see chart for details).    Ryan [name] is a 56 y.o. male who presents to ED for left knee pain 2/2 mechanical fall. X-ray with moderate left knee joint effusion and degenerative joint disease. No acute fracture. Able to bear weight, although exacerbates pain and with limp. Given tenderness along LCL, will place in knee immobilizer with crutches and have  patient follow up with ortho. Symptomatic home care instructions discussed. [place] trolled substance database consulted with no active narcotic prescriptions in 2018.  Short course of tramadol given for severe pain.  Reasons to return to ER discussed and all questions answered.   Final Clinical Impressions(s) / ED Diagnoses   Final diagnoses:  Acute pain of left knee    ED Discharge Orders        Ordered    traMADol (ULTRAM) 50 MG tablet  Every 8 hours PRN     11/22/17 1638       Ward, Chase PicketJaime Pilcher, PA-C 11/22/17 1643    Wynetta FinesMessick, Peter C, MD 11/22/17 2310

## 2017-11-22 NOTE — ED Triage Notes (Signed)
Pt reports left knee pain after falling on ice last night, is able to bear weight but with difficulty. Swelling present to knee

## 2017-12-21 ENCOUNTER — Encounter (HOSPITAL_COMMUNITY): Payer: Self-pay | Admitting: Emergency Medicine

## 2017-12-21 ENCOUNTER — Ambulatory Visit (HOSPITAL_COMMUNITY)
Admission: EM | Admit: 2017-12-21 | Discharge: 2017-12-21 | Disposition: A | Discharge status: Home / Self Care | Payer: Self-pay | Attending: Family Medicine | Admitting: Family Medicine

## 2017-12-21 ENCOUNTER — Other Ambulatory Visit: Payer: Self-pay

## 2017-12-21 DIAGNOSIS — K0889 Other specified disorders of teeth and supporting structures: Secondary | ICD-10-CM

## 2017-12-21 DIAGNOSIS — Z794 Long term (current) use of insulin: Secondary | ICD-10-CM

## 2017-12-21 DIAGNOSIS — E119 Type 2 diabetes mellitus without complications: Secondary | ICD-10-CM

## 2017-12-21 DIAGNOSIS — K047 Periapical abscess without sinus: Secondary | ICD-10-CM

## 2017-12-21 MED ORDER — INSULIN NPH (HUMAN) (ISOPHANE) 100 UNIT/ML ~~LOC~~ SUSP: 20.0000 [IU] | Freq: Two times a day (BID) | SUBCUTANEOUS | 1 refills | Status: DC

## 2017-12-21 MED ORDER — KETOROLAC TROMETHAMINE 60 MG/2ML IM SOLN
60.0000 mg | Freq: Once | INTRAMUSCULAR | Status: AC
  Administered 2017-12-21: 60 mg via INTRAMUSCULAR

## 2017-12-21 MED ORDER — AMOXICILLIN 875 MG PO TABS: 875.0000 mg | ORAL_TABLET | Freq: Two times a day (BID) | ORAL | 0 refills | Status: AC

## 2017-12-21 MED ORDER — KETOROLAC TROMETHAMINE 60 MG/2ML IM SOLN
INTRAMUSCULAR | Status: AC
  Filled 2017-12-21: qty 2

## 2017-12-21 MED ORDER — INSULIN NPH ISOPHANE & REGULAR (70-30) 100 UNIT/ML ~~LOC~~ SUSP: SUBCUTANEOUS | 1 refills | Status: DC

## 2017-12-21 MED ORDER — HYDROCODONE-ACETAMINOPHEN 5-325 MG PO TABS: 1.0000 | ORAL_TABLET | Freq: Four times a day (QID) | ORAL | 0 refills | Status: DC | PRN

## 2017-12-21 NOTE — Discharge Instructions (Signed)
Be aware, pain medications may cause drowsiness. Please do not drive, operate heavy machinery or make important decisions while on this medication, it can cloud your judgement.  

## 2017-12-21 NOTE — ED Triage Notes (Signed)
Pt c/o facial swelling and dental pain x2 days on the R side of his face. No issues breauthing or swallowing. Pt states "I think I have a dental abscess".

## 2017-12-22 NOTE — ED Provider Notes (Signed)
[facility]   [id number] 12/21/17 Arrival Time: 1509  ASSESSMENT & PLAN:  1. Pain, dental   2. Dental infection   3. Type 2 diabetes mellitus without complication, with long-term current use of insulin (HCC)     Meds ordered this encounter  Medications  . insulin NPH Human (HUMULIN N,NOVOLIN N) 100 UNIT/ML injection    Sig: Inject 0.2 mLs (20 Units total) into the skin 2 (two) times daily.    Dispense:  10 mL    Refill:  1  . insulin NPH-regular Human (NOVOLIN 70/30) (70-30) 100 UNIT/ML injection    Sig: Take a directed.    Dispense:  10 mL    Refill:  1  . HYDROcodone-acetaminophen (NORCO/VICODIN) 5-325 MG tablet    Sig: Take 1 tablet by mouth every 6 (six) hours as needed for moderate pain or severe pain.    Dispense:  8 tablet    Refill:  0  . amoxicillin (AMOXIL) 875 MG tablet    Sig: Take 1 tablet (875 mg total) by mouth 2 (two) times daily for 10 days.    Dispense:  20 tablet    Refill:  0  . ketorolac (TORADOL) injection 60 mg   Requests insulin refill.  [place] Controlled Substances Registry consulted for this patient. I feel the risk/benefit ratio today is favorable for proceeding with this prescription for a controlled substance. Medication sedation precautions given.  Dental resource written instructions given. He will schedule dental evaluation as soon as possible.  Reviewed expectations re: course of current medical issues. Questions answered. Outlined signs and symptoms indicating need for more acute intervention. Patient verbalized understanding. After Visit Summary given.   SUBJECTIVE:  Ryan [name] is a 57 y.o. male who reports gradual onset of right upper dental pain. Present for 2-3 days. Afebrile. Tolerating PO intake but reports pain with chewing. No specific alleviating factors reported. He does not see a dentist regularly. OTC analgesics without relief. No neck pain or swelling. No drainage from gums. Has been out of insulin and request  refills.  ROS: As per HPI.  OBJECTIVE:  Vitals:   12/21/17 1531  BP: (!) 160/91  Pulse: 77  Resp: 14  Temp: 98.1 F (36.7 C)  SpO2: 97%    General appearance: alert; no distress HENT: normocephalic; atraumatic; dentition: fair; mild gingival hypertrophy over R upper gums; no areas of fluctuance; some swelling Neck: supple without LAD Lungs: normal respirations Skin: warm and dry Psychological: alert and cooperative; normal mood and affect  No Known Allergies  Past Medical History:  Diagnosis Date  . Achilles tendon rupture right  . Arthritis    back and shoulders  . Chest pain    due to cocaine use  . Diabetes mellitus   . Diabetes mellitus without complication (HCC)   . Drug abuse (HCC)    cocaine, marijuana abuse  . GERD (gastroesophageal reflux disease)   . Hypertension   . Renal insufficiency    Social History   Socioeconomic History  . Marital status: Single    Spouse name: Not on file  . Number of children: Not on file  . Years of education: Not on file  . Highest education level: Not on file  Social Needs  . Financial resource strain: Not on file  . Food insecurity - worry: Not on file  . Food insecurity - inability: Not on file  . Transportation needs - medical: Not on file  . Transportation needs - non-medical: Not on file  Occupational History  . Not on file  Tobacco Use  . Smoking status: Never Smoker  . Smokeless tobacco: Never Used  Substance and Sexual Activity  . Alcohol use: Yes    Comment: occassional  . Drug use: Yes    Types: Cocaine, Marijuana    Comment: last cocaine use 2 to 3 yrs ago, last marijuana use 4 to 5 years ago  . Sexual activity: Not on file  Other Topics Concern  . Not on file  Social History Narrative   ** Merged History Encounter **       Pt currently lives with his mother ,brother, and his sister in [place]. He is employed by [employer]. He denies tobacco use. He does endorse occasional alcohol use. He  does endorse cocaine use.  Family history : Non contributory    Past Surgical History:  Procedure Laterality Date  . ACHILLES TENDON SURGERY Right 05/02/2014   Procedure: RIGHT ACHILLES TENDON REPAIR;  Surgeon: Javier Docker, MD;  Location: [facility];  Service: Orthopedics;  Laterality: Right;  . CARDIAC CATHETERIZATION  05.31.2012   showing patent coronaries with no visualized vasospasm and EF of 55-60 %  . ORBITAL FRACTURE SURGERY Left 2-3 yrs ago     Mardella Layman, MD 12/22/17 0930

## 2018-01-28 ENCOUNTER — Emergency Department (HOSPITAL_COMMUNITY)
Admission: EM | Admit: 2018-01-28 | Discharge: 2018-01-28 | Disposition: A | Discharge status: Home / Self Care | Payer: Self-pay | Attending: Emergency Medicine | Admitting: Emergency Medicine

## 2018-01-28 ENCOUNTER — Other Ambulatory Visit: Payer: Self-pay

## 2018-01-28 ENCOUNTER — Emergency Department (HOSPITAL_COMMUNITY): Payer: Self-pay

## 2018-01-28 ENCOUNTER — Encounter (HOSPITAL_COMMUNITY): Payer: Self-pay

## 2018-01-28 DIAGNOSIS — Z794 Long term (current) use of insulin: Secondary | ICD-10-CM | POA: Insufficient documentation

## 2018-01-28 DIAGNOSIS — Z79899 Other long term (current) drug therapy: Secondary | ICD-10-CM | POA: Insufficient documentation

## 2018-01-28 DIAGNOSIS — R1084 Generalized abdominal pain: Secondary | ICD-10-CM | POA: Insufficient documentation

## 2018-01-28 DIAGNOSIS — E119 Type 2 diabetes mellitus without complications: Secondary | ICD-10-CM | POA: Insufficient documentation

## 2018-01-28 DIAGNOSIS — R197 Diarrhea, unspecified: Secondary | ICD-10-CM | POA: Insufficient documentation

## 2018-01-28 DIAGNOSIS — I10 Essential (primary) hypertension: Secondary | ICD-10-CM | POA: Insufficient documentation

## 2018-01-28 LAB — CBC
HCT: 43.9 % (ref 39.0–52.0)
HEMOGLOBIN: 14.5 g/dL (ref 13.0–17.0)
MCH: 26.9 pg (ref 26.0–34.0)
MCHC: 33 g/dL (ref 30.0–36.0)
MCV: 81.4 fL (ref 78.0–100.0)
PLATELETS: 231 10*3/uL (ref 150–400)
RBC: 5.39 MIL/uL (ref 4.22–5.81)
RDW: 17.1 % — AB (ref 11.5–15.5)
WBC: 6.7 10*3/uL (ref 4.0–10.5)

## 2018-01-28 LAB — COMPREHENSIVE METABOLIC PANEL
ALBUMIN: 3.7 g/dL (ref 3.5–5.0)
ALK PHOS: 119 U/L (ref 38–126)
ALT: 22 U/L (ref 17–63)
ANION GAP: 10 (ref 5–15)
AST: 31 U/L (ref 15–41)
BILIRUBIN TOTAL: 1 mg/dL (ref 0.3–1.2)
BUN: 17 mg/dL (ref 6–20)
CO2: 21 mmol/L — AB (ref 22–32)
CREATININE: 1.69 mg/dL — AB (ref 0.61–1.24)
Calcium: 8.7 mg/dL — ABNORMAL LOW (ref 8.9–10.3)
Chloride: 107 mmol/L (ref 101–111)
GFR calc Af Amer: 51 mL/min — ABNORMAL LOW (ref >60)
GFR calc non Af Amer: 44 mL/min — ABNORMAL LOW (ref >60)
GLUCOSE: 133 mg/dL — AB (ref 65–99)
Potassium: 3.9 mmol/L (ref 3.5–5.1)
SODIUM: 138 mmol/L (ref 135–145)
TOTAL PROTEIN: 7.5 g/dL (ref 6.5–8.1)

## 2018-01-28 LAB — URINALYSIS, ROUTINE W REFLEX MICROSCOPIC
Bacteria, UA: NONE SEEN
Bilirubin Urine: NEGATIVE
GLUCOSE, UA: NEGATIVE mg/dL
HGB URINE DIPSTICK: NEGATIVE
Ketones, ur: NEGATIVE mg/dL
Leukocytes, UA: NEGATIVE
NITRITE: NEGATIVE
PH: 5 (ref 5.0–8.0)
PROTEIN: 30 mg/dL — AB
SPECIFIC GRAVITY, URINE: 1.026 (ref 1.005–1.030)
Squamous Epithelial / LPF: NONE SEEN

## 2018-01-28 LAB — LIPASE, BLOOD: LIPASE: 31 U/L (ref 11–51)

## 2018-01-28 MED ORDER — IOPAMIDOL (ISOVUE-300) INJECTION 61%
INTRAVENOUS | Status: AC
  Administered 2018-01-28: 100 mL
  Filled 2018-01-28: qty 100

## 2018-01-28 MED ORDER — LOPERAMIDE HCL 2 MG PO CAPS
4.0000 mg | ORAL_CAPSULE | Freq: Once | ORAL | Status: AC
  Administered 2018-01-28: 4 mg via ORAL
  Filled 2018-01-28: qty 2

## 2018-01-28 MED ORDER — ONDANSETRON HCL 4 MG/2ML IJ SOLN
4.0000 mg | Freq: Once | INTRAMUSCULAR | Status: AC
  Administered 2018-01-28: 4 mg via INTRAVENOUS
  Filled 2018-01-28: qty 2

## 2018-01-28 MED ORDER — HYDROMORPHONE HCL 1 MG/ML IJ SOLN
1.0000 mg | Freq: Once | INTRAMUSCULAR | Status: AC
  Administered 2018-01-28: 1 mg via INTRAVENOUS
  Filled 2018-01-28: qty 1

## 2018-01-28 NOTE — ED Triage Notes (Signed)
Patient complains of generalized abdominal pain since 0700 with diarrhea and nausea. Denies fever, alert and oriented, NAD

## 2018-01-28 NOTE — ED Provider Notes (Signed)
[facility] Provider Note   CSN: [id number] Arrival date & time: 01/28/18  1330     History   Chief Complaint Chief Complaint  Patient presents with  . Abdominal Pain    HPI Ryan [name] is a 57 y.o. male.  The history is provided by the patient. No language interpreter was used.  Abdominal Pain   This is a new problem. The current episode started 6 to 12 hours ago. The problem occurs constantly. The problem has not changed since onset.The pain is associated with an unknown factor. The pain is located in the generalized abdominal region. The pain is at a severity of 4/10. The pain is moderate. Pertinent negatives include fever, vomiting and constipation. Nothing aggravates the symptoms. Nothing relieves the symptoms. Past workup does not include GI consult or CT scan. His past medical history does not include GERD.  Pt complains of approximately 10 episodes of diarrhea.  Pt reports increased gas.    Past Medical History:  Diagnosis Date  . Achilles tendon rupture right  . Arthritis    back and shoulders  . Chest pain    due to cocaine use  . Diabetes mellitus   . Diabetes mellitus without complication (HCC)   . Drug abuse (HCC)    cocaine, marijuana abuse  . GERD (gastroesophageal reflux disease)   . Hypertension   . Renal insufficiency     Patient Active Problem List   Diagnosis Date Noted  . Right ankle pain 05/15/2014  . Need for Tdap vaccination 05/15/2014  . Unspecified essential hypertension 05/14/2014  . Diabetes mellitus (HCC) 05/14/2014  . Rupture of right Achilles tendon 05/02/2014  . Tendon rupture, Achilles 05/02/2014    Past Surgical History:  Procedure Laterality Date  . ACHILLES TENDON SURGERY Right 05/02/2014   Procedure: RIGHT ACHILLES TENDON REPAIR;  Surgeon: Javier DockerJeffrey C Beane, MD;  Location: [facility];  Service: Orthopedics;  Laterality: Right;  . CARDIAC CATHETERIZATION  05.31.2012   showing patent  coronaries with no visualized vasospasm and EF of 55-60 %  . ORBITAL FRACTURE SURGERY Left 2-3 yrs ago       Home Medications    Prior to Admission medications   Medication Sig Start Date End Date Taking? Authorizing Provider  amLODipine (NORVASC) 10 MG tablet Take 10 mg by mouth every morning.     [provider]  carvedilol (COREG) 25 MG tablet Take 25 mg by mouth 2 (two) times daily.     [provider]  docusate sodium (COLACE) 100 MG capsule Take 1 capsule (100 mg total) by mouth 2 (two) times daily. Patient not taking: Reported on 10/19/2016 05/02/14   Dorothy SparkBissell, Jaclyn M, PA-C  furosemide (LASIX) 20 MG tablet Take 1 tablet (20 mg total) by mouth daily. 05/15/17   Jacalyn LefevreHaviland, Julie, MD  HYDROcodone-acetaminophen (NORCO/VICODIN) 5-325 MG tablet Take 1 tablet by mouth every 6 (six) hours as needed for moderate pain or severe pain. 12/21/17   Mardella LaymanHagler, Brian, MD  insulin NPH Human (HUMULIN N,NOVOLIN N) 100 UNIT/ML injection Inject 0.2 mLs (20 Units total) into the skin 2 (two) times daily. 12/21/17   Mardella LaymanHagler, Brian, MD  insulin NPH-regular Human (NOVOLIN 70/30) (70-30) 100 UNIT/ML injection Take a directed. 12/21/17   Mardella LaymanHagler, Brian, MD  lisinopril-hydrochlorothiazide (PRINZIDE,ZESTORETIC) 20-25 MG per tablet Take 1 tablet by mouth every morning.     [provider]  methocarbamol (ROBAXIN) 500 MG tablet Take 1 tablet (500 mg total) by mouth 2 (two) times daily.  01/13/17   Liberty Handy, PA-C  naproxen (NAPROSYN) 500 MG tablet Take 1 tablet (500 mg total) by mouth 2 (two) times daily. 10/09/17   Khatri, Hina, PA-C  penicillin v potassium (VEETID) 500 MG tablet Take 1 tablet (500 mg total) by mouth 4 (four) times daily. Patient not taking: Reported on 10/19/2016 07/15/16   Roxy Horseman, PA-C  traMADol (ULTRAM) 50 MG tablet Take 1 tablet (50 mg total) by mouth every 8 (eight) hours as needed. 11/22/17   Ward, Chase Picket, PA-C    Family History No family history on  file.  Social History Social History   Tobacco Use  . Smoking status: Never Smoker  . Smokeless tobacco: Never Used  Substance Use Topics  . Alcohol use: Yes    Comment: occassional  . Drug use: Yes    Types: Cocaine, Marijuana    Comment: last cocaine use 2 to 3 yrs ago, last marijuana use 4 to 5 years ago     Allergies   Patient has no known allergies.   Review of Systems Review of Systems  Constitutional: Negative for fever.  Gastrointestinal: Positive for abdominal pain. Negative for constipation and vomiting.  All other systems reviewed and are negative.    Physical Exam Updated Vital Signs BP (!) 138/91   Pulse 88   Temp 98.2 F (36.8 C) (Oral)   Resp 18   Ht 5\' 9"  (1.753 m)   Wt 95.3 kg (210 lb)   SpO2 100%   BMI 31.01 kg/m   Physical Exam  Constitutional: He appears well-developed and well-nourished.  HENT:  Head: Normocephalic and atraumatic.  Eyes: Conjunctivae are normal.  Neck: Neck supple.  Cardiovascular: Normal rate and regular rhythm.  No murmur heard. Pulmonary/Chest: Effort normal and breath sounds normal. No respiratory distress.  Abdominal: Soft. Bowel sounds are normal. He exhibits distension. There is tenderness.  Musculoskeletal: He exhibits no edema.  Neurological: He is alert.  Skin: Skin is warm and dry.  Psychiatric: He has a normal mood and affect.  Nursing note and vitals reviewed.    ED Treatments / Results  Labs (all labs ordered are listed, but only abnormal results are displayed) Labs Reviewed  COMPREHENSIVE METABOLIC PANEL - Abnormal; Notable for the following components:      Result Value   CO2 21 (*)    Glucose, Bld 133 (*)    Creatinine, Ser 1.69 (*)    Calcium 8.7 (*)    GFR calc non Af Amer 44 (*)    GFR calc Af Amer 51 (*)    All other components within normal limits  CBC - Abnormal; Notable for the following components:   RDW 17.1 (*)    All other components within normal limits  URINALYSIS, ROUTINE W  REFLEX MICROSCOPIC - Abnormal; Notable for the following components:   Protein, ur 30 (*)    All other components within normal limits  LIPASE, BLOOD    EKG  EKG Interpretation None       Radiology Ct Abdomen Pelvis W Contrast  Result Date: 01/28/2018 CLINICAL DATA:  Generalized abdominal pain since 7 a.m. with diarrhea and nausea. EXAM: CT ABDOMEN AND PELVIS WITH CONTRAST TECHNIQUE: Multidetector CT imaging of the abdomen and pelvis was performed using the standard protocol following bolus administration of intravenous contrast. CONTRAST:  ISOVUE-300 IOPAMIDOL (ISOVUE-300) INJECTION 61% COMPARISON:  CT abdomen dated 11/06/2013. FINDINGS: Lower chest: 5 mm pulmonary nodule within the right middle lobe. Lung bases otherwise clear. Hepatobiliary: No  focal liver abnormality is seen. No gallstones, gallbladder wall thickening, or biliary dilatation. Pancreas: Unremarkable. No pancreatic ductal dilatation or surrounding inflammatory changes. Spleen: Normal in size without focal abnormality. Adrenals/Urinary Tract: Adrenal glands appear normal. Kidneys are unremarkable without mass, stone or hydronephrosis. No ureteral or bladder calculi identified. Bladder appears normal. Stomach/Bowel: Fluid throughout the small and large bowel. Bowel is normal in caliber. No bowel wall thickening or evidence of bowel wall inflammation seen. Scattered mild diverticulosis within the descending and sigmoid colon without evidence of acute diverticulitis. Appendix is normal. Hiatal hernia, small to moderate in size. Stomach otherwise unremarkable. Vascular/Lymphatic: No significant vascular findings are present. No enlarged abdominal or pelvic lymph nodes. Reproductive: Prostate is unremarkable. Other: No free fluid or abscess collection. No free intraperitoneal air. Musculoskeletal: No acute or suspicious osseous finding. Small periumbilical abdominal wall hernia which contains fat only. Superficial soft tissues are  otherwise unremarkable. IMPRESSION: 1. Fluid throughout the nondistended large and small bowel. This can be an indication of underlying gastroenteritis and/or enterocolitis. No bowel obstruction or evidence of bowel wall inflammation. Mild colonic diverticulosis without evidence of acute diverticulitis. 2. **An incidental finding of potential clinical significance has been found. 5 mm pulmonary nodule within the right lower lobe. This level of the right lung has not been imaged on previous CT exams, therefore, it is of uncertain chronicity. No follow-up needed if patient is low-risk. Non-contrast chest CT can be considered in 12 months if patient is high-risk. This recommendation follows the consensus statement: Guidelines for Management of Incidental Pulmonary Nodules Detected on CT Images: From the Fleischner Society 2017; Radiology 2017; 284:228-243.** 3. Hiatal hernia, small to moderate in size. 4. Remainder of the abdomen and pelvis CT is unremarkable. No evidence of acute solid organ abnormality. No free fluid or abscess collection. No free intraperitoneal air. Appendix is normal. Electronically Signed   By: Bary Richard M.D.   On: 01/28/2018 18:38    Procedures Procedures (including critical care time)  Medications Ordered in ED Medications  iopamidol (ISOVUE-300) 61 % injection (100 mLs  Contrast Given 01/28/18 1815)  HYDROmorphone (DILAUDID) injection 1 mg (1 mg Intravenous Given 01/28/18 1924)  ondansetron (ZOFRAN) injection 4 mg (4 mg Intravenous Given 01/28/18 1924)     Initial Impression / Assessment and Plan / ED Course  I have reviewed the triage vital signs and the nursing notes.  Pertinent labs & imaging results that were available during my care of the patient were reviewed by me and considered in my medical decision making (see chart for details).     MDM  Pt counseled on results of ct.  Pt is advised of lung nodule.  Pt advised to follow up with [facility] to schedule ct  scan follow up of nodule. Pt given imodium here.     Final Clinical Impressions(s) / ED Diagnoses   Final diagnoses:  Diarrhea, unspecified type    ED Discharge Orders    None    An After Visit Summary was printed and given to the patient.    Osie Cheeks 01/28/18 2232    Loren Racer, MD 01/29/18 [phone]

## 2018-01-28 NOTE — Discharge Instructions (Signed)
You will need a follow up ct of your chest   in 1 year.  Return if any problems.

## 2018-02-14 ENCOUNTER — Encounter (HOSPITAL_COMMUNITY): Payer: Self-pay

## 2018-02-14 ENCOUNTER — Emergency Department (HOSPITAL_COMMUNITY): Payer: Self-pay

## 2018-02-14 ENCOUNTER — Emergency Department (HOSPITAL_COMMUNITY)
Admission: EM | Admit: 2018-02-14 | Discharge: 2018-02-14 | Disposition: A | Discharge status: Home / Self Care | Payer: Self-pay | Attending: Physician Assistant | Admitting: Physician Assistant

## 2018-02-14 DIAGNOSIS — Z794 Long term (current) use of insulin: Secondary | ICD-10-CM | POA: Insufficient documentation

## 2018-02-14 DIAGNOSIS — M25561 Pain in right knee: Secondary | ICD-10-CM | POA: Insufficient documentation

## 2018-02-14 DIAGNOSIS — W19XXXA Unspecified fall, initial encounter: Secondary | ICD-10-CM

## 2018-02-14 DIAGNOSIS — Z79899 Other long term (current) drug therapy: Secondary | ICD-10-CM | POA: Insufficient documentation

## 2018-02-14 DIAGNOSIS — W1789XA Other fall from one level to another, initial encounter: Secondary | ICD-10-CM | POA: Insufficient documentation

## 2018-02-14 DIAGNOSIS — Y9289 Other specified places as the place of occurrence of the external cause: Secondary | ICD-10-CM | POA: Insufficient documentation

## 2018-02-14 DIAGNOSIS — M25562 Pain in left knee: Secondary | ICD-10-CM | POA: Insufficient documentation

## 2018-02-14 DIAGNOSIS — E119 Type 2 diabetes mellitus without complications: Secondary | ICD-10-CM | POA: Insufficient documentation

## 2018-02-14 DIAGNOSIS — Y999 Unspecified external cause status: Secondary | ICD-10-CM | POA: Insufficient documentation

## 2018-02-14 DIAGNOSIS — I10 Essential (primary) hypertension: Secondary | ICD-10-CM | POA: Insufficient documentation

## 2018-02-14 DIAGNOSIS — Y9389 Activity, other specified: Secondary | ICD-10-CM | POA: Insufficient documentation

## 2018-02-14 MED ORDER — ACETAMINOPHEN 325 MG PO TABS: 650.0000 mg | ORAL_TABLET | Freq: Four times a day (QID) | ORAL | 0 refills | Status: DC | PRN

## 2018-02-14 MED ORDER — ACETAMINOPHEN 325 MG PO TABS
650.0000 mg | ORAL_TABLET | Freq: Once | ORAL | Status: AC
  Administered 2018-02-14: 650 mg via ORAL
  Filled 2018-02-14: qty 2

## 2018-02-14 NOTE — Discharge Instructions (Signed)
Please read attached information. If you experience any new or worsening signs or symptoms please return to the emergency room for evaluation. Please follow-up with your primary care provider or specialist as discussed. Please use medication prescribed only as directed and discontinue taking if you have any concerning signs or symptoms.   °

## 2018-02-14 NOTE — ED Provider Notes (Signed)
[name] Provider Note   CSN: [id number] Arrival date & time: 02/14/18  1602     History   Chief Complaint Chief Complaint  Patient presents with  . Fall    HPI [name] is a 57 y.o. male.  HPI   57 year old male presents status post fall.  Patient reports he was [place] a [place] the city [place] when he fell forward landing on his bilateral knees.  Patient notes pain T both anterior knees worse on the left.  Patient denies any loss of distal sensation strength and motor function but does note pain with range of motion of both knees.  Patient notes chronic bilateral knee pain worsened after the incident.  No medications prior to arrival.  No other injuries noted.  Past Medical History:  Diagnosis Date  . Achilles tendon rupture right  . Arthritis    back and shoulders  . Chest pain    due to cocaine use  . Diabetes mellitus   . Diabetes mellitus without complication (HCC)   . Drug abuse (HCC)    cocaine, marijuana abuse  . GERD (gastroesophageal reflux disease)   . Hypertension   . Renal insufficiency     Patient Active Problem List   Diagnosis Date Noted  . Right ankle pain 05/15/2014  . Need for Tdap vaccination 05/15/2014  . Unspecified essential hypertension 05/14/2014  . Diabetes mellitus (HCC) 05/14/2014  . Rupture of right Achilles tendon 05/02/2014  . Tendon rupture, Achilles 05/02/2014    Past Surgical History:  Procedure Laterality Date  . ACHILLES TENDON SURGERY Right 05/02/2014   Procedure: RIGHT ACHILLES TENDON REPAIR;  Surgeon: Javier Docker, MD;  Location: [facility];  Service: Orthopedics;  Laterality: Right;  . CARDIAC CATHETERIZATION  05.31.2012   showing patent coronaries with no visualized vasospasm and EF of 55-60 %  . ORBITAL FRACTURE SURGERY Left 2-3 yrs ago       Home Medications    Prior to Admission medications   Medication Sig Start Date End Date Taking? Authorizing Provider   amLODipine (NORVASC) 10 MG tablet Take 10 mg by mouth every morning.     [provider]  carvedilol (COREG) 25 MG tablet Take 25 mg by mouth 2 (two) times daily.     [provider]  docusate sodium (COLACE) 100 MG capsule Take 1 capsule (100 mg total) by mouth 2 (two) times daily. Patient not taking: Reported on 10/19/2016 05/02/14   Dorothy Spark, PA-C  furosemide (LASIX) 20 MG tablet Take 1 tablet (20 mg total) by mouth daily. 05/15/17   Jacalyn Lefevre, MD  HYDROcodone-acetaminophen (NORCO/VICODIN) 5-325 MG tablet Take 1 tablet by mouth every 6 (six) hours as needed for moderate pain or severe pain. 12/21/17   Mardella Layman, MD  insulin NPH Human (HUMULIN N,NOVOLIN N) 100 UNIT/ML injection Inject 0.2 mLs (20 Units total) into the skin 2 (two) times daily. 12/21/17   Mardella Layman, MD  insulin NPH-regular Human (NOVOLIN 70/30) (70-30) 100 UNIT/ML injection Take a directed. 12/21/17   Mardella Layman, MD  lisinopril-hydrochlorothiazide (PRINZIDE,ZESTORETIC) 20-25 MG per tablet Take 1 tablet by mouth every morning.     [provider]  methocarbamol (ROBAXIN) 500 MG tablet Take 1 tablet (500 mg total) by mouth 2 (two) times daily. 01/13/17   Liberty Handy, PA-C  naproxen (NAPROSYN) 500 MG tablet Take 1 tablet (500 mg total) by mouth 2 (two) times daily. 10/09/17   Dietrich Pates, PA-C  penicillin v potassium (VEETID) 500 MG tablet Take 1 tablet (500 mg total) by mouth 4 (four) times daily. Patient not taking: Reported on 10/19/2016 07/15/16   Roxy HorsemanBrowning, Robert, PA-C  traMADol (ULTRAM) 50 MG tablet Take 1 tablet (50 mg total) by mouth every 8 (eight) hours as needed. 11/22/17   Ward, Chase PicketJaime Pilcher, PA-C    Family History History reviewed. No pertinent family history.  Social History Social History   Tobacco Use  . Smoking status: Never Smoker  . Smokeless tobacco: Never Used  Substance Use Topics  . Alcohol use: Yes    Comment: occassional  . Drug use: Yes    Types:  Cocaine, Marijuana    Comment: last cocaine use 2 to 3 yrs ago, last marijuana use 4 to 5 years ago     Allergies   Patient has no known allergies.   Review of Systems Review of Systems  All other systems reviewed and are negative.    Physical Exam Updated Vital Signs BP (!) 153/100 (BP Location: Left Arm)   Pulse 70   Temp 98.4 F (36.9 C) (Oral)   Resp 16   SpO2 100%   Physical Exam  Constitutional: He is oriented to person, place, and time. He appears well-developed and well-nourished.  HENT:  Head: Normocephalic and atraumatic.  Eyes: Conjunctivae are normal. Pupils are equal, round, and reactive to light. Right eye exhibits no discharge. Left eye exhibits no discharge. No scleral icterus.  Neck: Normal range of motion. No JVD present. No tracheal deviation present.  Pulmonary/Chest: Effort normal. No stridor.  Musculoskeletal:  Superficial abrasion right anterior knee, no swelling or edema, tenderness to palpation of the entire anterior aspect flexion intact decreased due to pain, left anterior knee no swelling or edema, tenderness to palpation of the entire anterior and medial aspect decreased range of motion secondary to pain-patient able to bear weight without significant discomfort  Neurological: He is alert and oriented to person, place, and time. Coordination normal.  Psychiatric: He has a normal mood and affect. His behavior is normal. Judgment and thought content normal.  Nursing note and vitals reviewed.    ED Treatments / Results  Labs (all labs ordered are listed, but only abnormal results are displayed) Labs Reviewed - No data to display  EKG  EKG Interpretation None       Radiology Dg Knee 2 Views Left  Result Date: 02/14/2018 CLINICAL DATA:  Patient fell while pushing a wheelchair down a ramp. Anterior knee pain. EXAM: LEFT KNEE - 1-2 VIEW COMPARISON:  11/22/2017 FINDINGS: Tricompartmental osteoarthritis with smaller suprapatellar joint effusion  than on prior comparison. No acute fracture nor joint dislocation. Osteoarthritic spurring is noted of the tibial spines. There is also spurring off the medial aspect of the medial femoral condyle likely representing stigmata of old MCL injury. Calcification of the interosseous membrane is incidentally noted. IMPRESSION: Tricompartmental osteoarthritis of the left knee with smaller left suprapatellar joint effusion. No acute osseous abnormality. Other chronic findings as above. Electronically Signed   By: Tollie Ethavid  Kwon M.D.   On: 02/14/2018 19:11   Dg Knee 2 Views Right  Result Date: 02/14/2018 CLINICAL DATA:  Patient fell while pushing a wheelchair down a ramp. Patient landed on both knees and presents with anterior knee pain and swelling. EXAM: RIGHT KNEE - 1-2 VIEW COMPARISON:  None. FINDINGS: The femorotibial and patellofemoral compartments are maintained. No joint effusion is noted. Calcification of the interosseous membrane between the tibia and fibula is of incidental  note. IMPRESSION: No acute osseous abnormality.  No joint dislocation or effusion. Electronically Signed   By: Tollie Eth M.D.   On: 02/14/2018 19:06    Procedures Procedures (including critical care time)  Medications Ordered in ED Medications  acetaminophen (TYLENOL) tablet 650 mg (650 mg Oral Given 02/14/18 1923)     Initial Impression / Assessment and Plan / ED Course  I have reviewed the triage vital signs and the nursing notes.  Pertinent labs & imaging results that were available during my care of the patient were reviewed by me and considered in my medical decision making (see chart for details).     Final Clinical Impressions(s) / ED Diagnoses   Final diagnoses:  Fall, initial encounter  Acute pain of both knees    Labs:   Imaging: DG knee 2 view right and left  Consults:  Therapeutics:  Discharge Meds:   Assessment/Plan: 57 year old male presents today status post fall.  Patient with superficial  abrasion on the right knee, no signs of significant swelling or edema.  Patient has difficult range of motion secondary to pain but can stand and ambulate.  Patient's plain films without acute abnormality low suspicion for occult fracture or significant derangement of the knee.  Patient given Tylenol and encouraged to use Tylenol at home.  Ice elevate and rest.  Patient encouraged to follow-up with his primary care provider in 1 week if symptoms persist return as needed for any new or worsening signs or symptoms.  He verbalized understanding and agreement to today's plan had no further questions or concerns.   ED Discharge Orders    None       Rosalio Loud 02/14/18 1934    Abelino Derrick, MD 02/17/18 [phone]

## 2018-02-14 NOTE — ED Triage Notes (Signed)
Pt presents with pain to both knees with R>L, after falling on ramp pushing a wheelchair PTA.  Pt able to ambulate.

## 2018-02-22 ENCOUNTER — Encounter (HOSPITAL_COMMUNITY): Payer: Self-pay | Admitting: Emergency Medicine

## 2018-02-22 ENCOUNTER — Emergency Department (HOSPITAL_COMMUNITY): Payer: Self-pay

## 2018-02-22 DIAGNOSIS — Z79899 Other long term (current) drug therapy: Secondary | ICD-10-CM | POA: Insufficient documentation

## 2018-02-22 DIAGNOSIS — I10 Essential (primary) hypertension: Secondary | ICD-10-CM | POA: Insufficient documentation

## 2018-02-22 DIAGNOSIS — E119 Type 2 diabetes mellitus without complications: Secondary | ICD-10-CM | POA: Insufficient documentation

## 2018-02-22 DIAGNOSIS — M545 Low back pain: Secondary | ICD-10-CM | POA: Insufficient documentation

## 2018-02-22 DIAGNOSIS — M542 Cervicalgia: Secondary | ICD-10-CM | POA: Insufficient documentation

## 2018-02-22 DIAGNOSIS — Z794 Long term (current) use of insulin: Secondary | ICD-10-CM | POA: Insufficient documentation

## 2018-02-22 LAB — BASIC METABOLIC PANEL
ANION GAP: 11 (ref 5–15)
BUN: 17 mg/dL (ref 6–20)
CO2: 21 mmol/L — AB (ref 22–32)
Calcium: 8.5 mg/dL — ABNORMAL LOW (ref 8.9–10.3)
Chloride: 103 mmol/L (ref 101–111)
Creatinine, Ser: 1.58 mg/dL — ABNORMAL HIGH (ref 0.61–1.24)
GFR calc non Af Amer: 48 mL/min — ABNORMAL LOW (ref >60)
GFR, EST AFRICAN AMERICAN: 56 mL/min — AB (ref >60)
Glucose, Bld: 174 mg/dL — ABNORMAL HIGH (ref 65–99)
Potassium: 3.5 mmol/L (ref 3.5–5.1)
SODIUM: 135 mmol/L (ref 135–145)

## 2018-02-22 LAB — CBC
HCT: 37.5 % — ABNORMAL LOW (ref 39.0–52.0)
HEMOGLOBIN: 12.1 g/dL — AB (ref 13.0–17.0)
MCH: 26 pg (ref 26.0–34.0)
MCHC: 32.3 g/dL (ref 30.0–36.0)
MCV: 80.5 fL (ref 78.0–100.0)
Platelets: 223 10*3/uL (ref 150–400)
RBC: 4.66 MIL/uL (ref 4.22–5.81)
RDW: 17.3 % — ABNORMAL HIGH (ref 11.5–15.5)
WBC: 6.9 10*3/uL (ref 4.0–10.5)

## 2018-02-22 LAB — I-STAT TROPONIN, ED: TROPONIN I, POC: 0 ng/mL (ref 0.00–0.08)

## 2018-02-22 NOTE — ED Triage Notes (Signed)
Pt reports L sided neck pain with radiation to L arm onset earlier today. Also reports mid back pain with radiation into abd. States pain is 8/10, unable to describe pain.

## 2018-02-23 ENCOUNTER — Emergency Department (HOSPITAL_COMMUNITY): Payer: Self-pay

## 2018-02-23 ENCOUNTER — Emergency Department (HOSPITAL_COMMUNITY)
Admission: EM | Admit: 2018-02-23 | Discharge: 2018-02-23 | Disposition: A | Discharge status: Home / Self Care | Payer: Self-pay | Attending: Emergency Medicine | Admitting: Emergency Medicine

## 2018-02-23 DIAGNOSIS — M545 Low back pain, unspecified: Secondary | ICD-10-CM

## 2018-02-23 DIAGNOSIS — M542 Cervicalgia: Secondary | ICD-10-CM

## 2018-02-23 DIAGNOSIS — R52 Pain, unspecified: Secondary | ICD-10-CM

## 2018-02-23 LAB — URINALYSIS, ROUTINE W REFLEX MICROSCOPIC
BILIRUBIN URINE: NEGATIVE
GLUCOSE, UA: NEGATIVE mg/dL
HGB URINE DIPSTICK: NEGATIVE
KETONES UR: NEGATIVE mg/dL
Leukocytes, UA: NEGATIVE
Nitrite: NEGATIVE
PROTEIN: NEGATIVE mg/dL
Specific Gravity, Urine: 1.016 (ref 1.005–1.030)
pH: 6 (ref 5.0–8.0)

## 2018-02-23 MED ORDER — METHOCARBAMOL 500 MG PO TABS
1000.0000 mg | ORAL_TABLET | Freq: Once | ORAL | Status: AC
  Administered 2018-02-23: 1000 mg via ORAL
  Filled 2018-02-23: qty 2

## 2018-02-23 MED ORDER — HYDROCODONE-ACETAMINOPHEN 5-325 MG PO TABS
1.0000 | ORAL_TABLET | Freq: Once | ORAL | Status: AC
  Administered 2018-02-23: 1 via ORAL
  Filled 2018-02-23: qty 1

## 2018-02-23 MED ORDER — NAPROXEN 375 MG PO TABS: 375.0000 mg | ORAL_TABLET | Freq: Two times a day (BID) | ORAL | 0 refills | Status: DC | PRN

## 2018-02-23 MED ORDER — KETOROLAC TROMETHAMINE 30 MG/ML IJ SOLN: 30.0000 mg | Freq: Once | INTRAMUSCULAR | Status: DC

## 2018-02-23 MED ORDER — METHOCARBAMOL 500 MG PO TABS: 500.0000 mg | ORAL_TABLET | Freq: Two times a day (BID) | ORAL | 0 refills | Status: DC | PRN

## 2018-02-23 NOTE — Discharge Instructions (Signed)
It was my pleasure taking care of you today!   You have been seen in the Emergency Department today for back /  neck pain.   Naproxen as needed for pain. Robaxin is your muscle relaxer to take as needed. In addition to this, use ice and/or heat for additional pain relief.  Your back pain should get better over the next 2 weeks. Please follow up with your doctor this week for a recheck if still having symptoms.  COLD THERAPY DIRECTIONS:  Ice or gel packs can be used to reduce both pain and swelling. Ice is the most helpful within the first 24 to 48 hours after an injury or flareup from overusing a muscle or joint.  Ice is effective, has very few side effects, and is safe for most people to use.    Return to the ED for worsening back pain, fever, weakness of either leg, or if you develop either (1) an inability to urinate or have bowel movements, or (2) loss of your ability to control your bathroom functions (if you start having "accidents"), or if you develop other new symptoms that concern you.

## 2018-02-23 NOTE — ED Notes (Signed)
Patient transported to X-ray 

## 2018-02-23 NOTE — ED Provider Notes (Signed)
[facility] EMERGENCY DEPARTMENT Provider Note   CSN: [id number] Arrival date & time: 02/22/18  2058     History   Chief Complaint Chief Complaint  Patient presents with  . Neck Pain  . Back Pain    HPI [name] is a 57 y.o. male.  The history is provided by medical records and the patient. No language interpreter was used.  Neck Pain    Back Pain     [name] is a 57 y.o. male  with a PMH of DM, HTN who presents to the Emergency Department complaining of left-sided neck pain which radiates to the left arm which began yesterday afternoon.  He also reports associated mid and low back pain, mostly to the left.  Denies any abdominal pain, but does state that left-sided back pain will radiate around towards flank.  Denies any nausea or vomiting.  No chest pain, shortness of breath or diaphoresis.  No fever or chills.  He has not taken any medications prior to arrival for symptoms.  Pain worse with certain movements and better when sitting up or lying still.   Past Medical History:  Diagnosis Date  . Achilles tendon rupture right  . Arthritis    back and shoulders  . Chest pain    due to cocaine use  . Diabetes mellitus   . Diabetes mellitus without complication (HCC)   . Drug abuse (HCC)    cocaine, marijuana abuse  . GERD (gastroesophageal reflux disease)   . Hypertension   . Renal insufficiency     Patient Active Problem List   Diagnosis Date Noted  . Right ankle pain 05/15/2014  . Need for Tdap vaccination 05/15/2014  . Unspecified essential hypertension 05/14/2014  . Diabetes mellitus (HCC) 05/14/2014  . Rupture of right Achilles tendon 05/02/2014  . Tendon rupture, Achilles 05/02/2014    Past Surgical History:  Procedure Laterality Date  . ACHILLES TENDON SURGERY Right 05/02/2014   Procedure: RIGHT ACHILLES TENDON REPAIR;  Surgeon: Javier Docker, MD;  Location: [facility];  Service: Orthopedics;  Laterality: Right;  . CARDIAC  CATHETERIZATION  05.31.2012   showing patent coronaries with no visualized vasospasm and EF of 55-60 %  . ORBITAL FRACTURE SURGERY Left 2-3 yrs ago       Home Medications    Prior to Admission medications   Medication Sig Start Date End Date Taking? Authorizing Provider  acetaminophen (TYLENOL) 325 MG tablet Take 2 tablets (650 mg total) by mouth every 6 (six) hours as needed. 02/14/18   Hedges, Tinnie Gens, PA-C  amLODipine (NORVASC) 10 MG tablet Take 10 mg by mouth every morning.     [provider]  carvedilol (COREG) 25 MG tablet Take 25 mg by mouth 2 (two) times daily.     [provider]  docusate sodium (COLACE) 100 MG capsule Take 1 capsule (100 mg total) by mouth 2 (two) times daily. Patient not taking: Reported on 10/19/2016 05/02/14   Dorothy Spark, PA-C  furosemide (LASIX) 20 MG tablet Take 1 tablet (20 mg total) by mouth daily. 05/15/17   Jacalyn Lefevre, MD  HYDROcodone-acetaminophen (NORCO/VICODIN) 5-325 MG tablet Take 1 tablet by mouth every 6 (six) hours as needed for moderate pain or severe pain. 12/21/17   Mardella Layman, MD  insulin NPH Human (HUMULIN N,NOVOLIN N) 100 UNIT/ML injection Inject 0.2 mLs (20 Units total) into the skin 2 (two) times daily. 12/21/17   Mardella Layman, MD  insulin NPH-regular Human (NOVOLIN 70/30) (70-30)  100 UNIT/ML injection Take a directed. 12/21/17   Mardella Layman, MD  lisinopril-hydrochlorothiazide (PRINZIDE,ZESTORETIC) 20-25 MG per tablet Take 1 tablet by mouth every morning.     [provider]  methocarbamol (ROBAXIN) 500 MG tablet Take 1 tablet (500 mg total) by mouth 2 (two) times daily as needed. 02/23/18   Meosha Castanon, Chase Picket, PA-C  naproxen (NAPROSYN) 375 MG tablet Take 1 tablet (375 mg total) by mouth 2 (two) times daily as needed. 02/23/18   Merelin Human, Chase Picket, PA-C  penicillin v potassium (VEETID) 500 MG tablet Take 1 tablet (500 mg total) by mouth 4 (four) times daily. Patient not taking: Reported on 10/19/2016 07/15/16    Roxy Horseman, PA-C  traMADol (ULTRAM) 50 MG tablet Take 1 tablet (50 mg total) by mouth every 8 (eight) hours as needed. 11/22/17   Dorris Vangorder, Chase Picket, PA-C    Family History No family history on file.  Social History Social History   Tobacco Use  . Smoking status: Never Smoker  . Smokeless tobacco: Never Used  Substance Use Topics  . Alcohol use: Yes    Comment: occassional  . Drug use: Yes    Types: Cocaine, Marijuana    Comment: last cocaine use 2 to 3 yrs ago, last marijuana use 4 to 5 years ago     Allergies   Patient has no known allergies.   Review of Systems Review of Systems  Musculoskeletal: Positive for back pain and neck pain.  All other systems reviewed and are negative.    Physical Exam Updated Vital Signs BP (!) 142/98 (BP Location: Left Arm)   Pulse 62   Temp 98.5 F (36.9 C) (Oral)   Resp 16   Ht 5\' 9"  (1.753 m)   Wt 104.3 kg (230 lb)   SpO2 99%   BMI 33.97 kg/m   Physical Exam  Constitutional: He is oriented to person, place, and time. He appears well-developed and well-nourished.  Neck:  Tenderness to palpation of left paraspinal musculature and trapezius. Full ROM.  Cardiovascular: Normal rate, regular rhythm, normal heart sounds and intact distal pulses.  Pulmonary/Chest: Effort normal and breath sounds normal. No respiratory distress.  Abdominal: Soft. Bowel sounds are normal. He exhibits no distension.  No abdominal tenderness.  Musculoskeletal:       Back:  Tenderness to palpation as depicted in image. 5/5 muscle strength in bilateral LE's. Straight leg raises are  negative bilaterally for radicular symptoms.  Neurological: He is alert and oriented to person, place, and time. He has normal reflexes.  Bilateral lower extremities neurovascularly intact.  Skin: Skin is warm and dry. No rash noted. No erythema.  Nursing note and vitals reviewed.    ED Treatments / Results  Labs (all labs ordered are listed, but only abnormal  results are displayed) Labs Reviewed  BASIC METABOLIC PANEL - Abnormal; Notable for the following components:      Result Value   CO2 21 (*)    Glucose, Bld 174 (*)    Creatinine, Ser 1.58 (*)    Calcium 8.5 (*)    GFR calc non Af Amer 48 (*)    GFR calc Af Amer 56 (*)    All other components within normal limits  CBC - Abnormal; Notable for the following components:   Hemoglobin 12.1 (*)    HCT 37.5 (*)    RDW 17.3 (*)    All other components within normal limits  URINALYSIS, ROUTINE W REFLEX MICROSCOPIC  I-STAT TROPONIN, ED  EKG  EKG Interpretation  Date/Time:  Wednesday February 22 2018 21:47:10 EDT Ventricular Rate:  77 PR Interval:  170 QRS Duration: 80 QT Interval:  376 QTC Calculation: 425 R Axis:   53 Text Interpretation:  Normal sinus rhythm ST & T wave abnormality, consider lateral ischemia Abnormal ECG When compared with ECG of 05/15/2017, No significant change was found Confirmed by Dione BoozeGlick, David ([id number]) on 02/23/2018 6:06:25 AM Also confirmed by Dione BoozeGlick, David ([id number]), editor Elita QuickWatlington, Beverly ([id number])  on 02/23/2018 7:15:49 AM       Radiology Dg Chest 2 View  Result Date: 02/22/2018 CLINICAL DATA:  Acute onset of left-sided neck pain, radiating to left arm. Mid back pain. EXAM: CHEST - 2 VIEW COMPARISON:  Chest radiograph performed 05/15/2017 FINDINGS: The lungs are well-aerated and clear. There is no evidence of focal opacification, pleural effusion or pneumothorax. The heart is normal in size; the mediastinal contour is within normal limits. No acute osseous abnormalities are seen. IMPRESSION: No acute cardiopulmonary process seen. Electronically Signed   By: Roanna RaiderJeffery  Chang M.D.   On: 02/22/2018 22:24   Dg Thoracic Spine W/swimmers  Result Date: 02/23/2018 CLINICAL DATA:  Acute onset of mid back pain, radiating to the abdomen. EXAM: THORACIC SPINE - 3 VIEWS COMPARISON:  Chest radiograph performed 02/22/2018 FINDINGS: There is no evidence of fracture or subluxation.  Vertebral bodies demonstrate normal height and alignment. Intervertebral disc spaces are preserved. Anterior and lateral osteophytes are noted along the lower thoracic spine. The visualized portions of both lungs are clear. The mediastinum is unremarkable in appearance. IMPRESSION: No evidence of fracture or subluxation along the thoracic spine. Electronically Signed   By: Roanna RaiderJeffery  Chang M.D.   On: 02/23/2018 06:46   Dg Lumbar Spine Complete  Result Date: 02/23/2018 CLINICAL DATA:  Acute onset of mid to lower back pain. EXAM: LUMBAR SPINE - COMPLETE 4+ VIEW COMPARISON:  CT of the abdomen and pelvis performed 01/28/2018 FINDINGS: There is no evidence of fracture or subluxation. Vertebral bodies demonstrate normal height and alignment. Intervertebral disc spaces are preserved. The visualized neural foramina are grossly unremarkable in appearance. Prominent anterior and lateral osteophytes are noted along the lower thoracic spine. The visualized bowel gas pattern is unremarkable in appearance; air and stool are noted within the colon. The sacroiliac joints are within normal limits. IMPRESSION: No evidence of fracture or subluxation along the lumbar spine. Electronically Signed   By: Roanna RaiderJeffery  Chang M.D.   On: 02/23/2018 06:47    Procedures Procedures (including critical care time)  Medications Ordered in ED Medications  HYDROcodone-acetaminophen (NORCO/VICODIN) 5-325 MG per tablet 1 tablet (1 tablet Oral Given 02/23/18 0639)  methocarbamol (ROBAXIN) tablet 1,000 mg (1,000 mg Oral Given 02/23/18 [id number])     Initial Impression / Assessment and Plan / ED Course  I have reviewed the triage vital signs and the nursing notes.  Pertinent labs & imaging results that were available during my care of the patient were reviewed by me and considered in my medical decision making (see chart for details).    Cecil Cobbsnthony Ram is a 57 y.o. male who presents to ED for left-sided neck and back pain which began yesterday  afternoon. Labs, EKG and CXR were obtained in triage prior to my evaluation. These were reviewed and non-concerning. On exam, patient is afebrile, hemodynamically stable with benign abdominal exam. LE's NVI. Plain films of the back without acute abnormality. Kidney stone possible but would be atypical and UA negative with no hgb. No signs of cauda equina.  No signs of infectious etiology. Likely musk. Symptomatic home care instructions and return precautions discussed.  PCP follow-up recommended.  All questions answered.   Final Clinical Impressions(s) / ED Diagnoses   Final diagnoses:  Neck pain  Acute left-sided low back pain without sciatica    ED Discharge Orders        Ordered    methocarbamol (ROBAXIN) 500 MG tablet  2 times daily PRN     02/23/18 0717    naproxen (NAPROSYN) 375 MG tablet  2 times daily PRN     02/23/18 0717       Lakyla Biswas, Chase Picket, PA-C 02/23/18 1610    Dione Booze, MD 02/23/18 [phone]

## 2018-02-25 ENCOUNTER — Other Ambulatory Visit: Payer: Self-pay

## 2018-02-25 ENCOUNTER — Encounter (HOSPITAL_COMMUNITY): Payer: Self-pay | Admitting: Emergency Medicine

## 2018-02-25 ENCOUNTER — Emergency Department (HOSPITAL_COMMUNITY): Admission: EM | Admit: 2018-02-25 | Discharge: 2018-02-25 | Discharge status: Against Medical Advice | Payer: Self-pay

## 2018-02-25 DIAGNOSIS — E119 Type 2 diabetes mellitus without complications: Secondary | ICD-10-CM | POA: Insufficient documentation

## 2018-02-25 DIAGNOSIS — Z79899 Other long term (current) drug therapy: Secondary | ICD-10-CM | POA: Insufficient documentation

## 2018-02-25 DIAGNOSIS — R2243 Localized swelling, mass and lump, lower limb, bilateral: Secondary | ICD-10-CM | POA: Insufficient documentation

## 2018-02-25 DIAGNOSIS — I10 Essential (primary) hypertension: Secondary | ICD-10-CM | POA: Insufficient documentation

## 2018-02-25 DIAGNOSIS — R0789 Other chest pain: Secondary | ICD-10-CM | POA: Insufficient documentation

## 2018-02-25 DIAGNOSIS — Z794 Long term (current) use of insulin: Secondary | ICD-10-CM | POA: Insufficient documentation

## 2018-02-25 NOTE — ED Notes (Signed)
No answer the pt never came back inside

## 2018-02-25 NOTE — ED Notes (Signed)
n o answer ahwn called to triage.  Registration saw him go outside  And he has not come back in

## 2018-02-25 NOTE — ED Triage Notes (Signed)
Patient comes in complaining of left sided neck pain, with radiation to arm and chest pain. Patient seen 3/13 for similar complaints. Patient also stating he has leg edema.

## 2018-02-26 ENCOUNTER — Encounter (HOSPITAL_COMMUNITY): Payer: Self-pay | Admitting: Radiology

## 2018-02-26 ENCOUNTER — Emergency Department (HOSPITAL_COMMUNITY): Payer: Self-pay

## 2018-02-26 ENCOUNTER — Emergency Department (HOSPITAL_BASED_OUTPATIENT_CLINIC_OR_DEPARTMENT_OTHER)
Admit: 2018-02-26 | Discharge: 2018-02-26 | Disposition: A | Discharge status: Home / Self Care | Payer: Self-pay | Attending: Emergency Medicine | Admitting: Emergency Medicine

## 2018-02-26 ENCOUNTER — Emergency Department (HOSPITAL_COMMUNITY)
Admission: EM | Admit: 2018-02-26 | Discharge: 2018-02-26 | Disposition: A | Discharge status: Home / Self Care | Payer: Self-pay | Attending: Emergency Medicine | Admitting: Emergency Medicine

## 2018-02-26 DIAGNOSIS — R0789 Other chest pain: Secondary | ICD-10-CM

## 2018-02-26 DIAGNOSIS — R600 Localized edema: Secondary | ICD-10-CM

## 2018-02-26 DIAGNOSIS — R609 Edema, unspecified: Secondary | ICD-10-CM

## 2018-02-26 LAB — BASIC METABOLIC PANEL
Anion gap: 9 (ref 5–15)
BUN: 17 mg/dL (ref 6–20)
CALCIUM: 8.6 mg/dL — AB (ref 8.9–10.3)
CO2: 21 mmol/L — ABNORMAL LOW (ref 22–32)
CREATININE: 1.29 mg/dL — AB (ref 0.61–1.24)
Chloride: 107 mmol/L (ref 101–111)
GFR calc Af Amer: >60 mL/min (ref >60)
GLUCOSE: 183 mg/dL — AB (ref 65–99)
Potassium: 3.8 mmol/L (ref 3.5–5.1)
SODIUM: 137 mmol/L (ref 135–145)

## 2018-02-26 LAB — CBC WITH DIFFERENTIAL/PLATELET
Basophils Absolute: 0 10*3/uL (ref 0.0–0.1)
Basophils Relative: 0 %
EOS ABS: 0.1 10*3/uL (ref 0.0–0.7)
EOS PCT: 2 %
HCT: 37.4 % — ABNORMAL LOW (ref 39.0–52.0)
Hemoglobin: 12.1 g/dL — ABNORMAL LOW (ref 13.0–17.0)
LYMPHS ABS: 2.3 10*3/uL (ref 0.7–4.0)
Lymphocytes Relative: 42 %
MCH: 26.1 pg (ref 26.0–34.0)
MCHC: 32.4 g/dL (ref 30.0–36.0)
MCV: 80.8 fL (ref 78.0–100.0)
MONO ABS: 0.8 10*3/uL (ref 0.1–1.0)
Monocytes Relative: 15 %
Neutro Abs: 2.2 10*3/uL (ref 1.7–7.7)
Neutrophils Relative %: 41 %
PLATELETS: 250 10*3/uL (ref 150–400)
RBC: 4.63 MIL/uL (ref 4.22–5.81)
RDW: 17.1 % — AB (ref 11.5–15.5)
WBC: 5.4 10*3/uL (ref 4.0–10.5)

## 2018-02-26 LAB — D-DIMER, QUANTITATIVE (NOT AT ARMC): D DIMER QUANT: 0.91 ug{FEU}/mL — AB (ref 0.00–0.50)

## 2018-02-26 LAB — URINALYSIS, ROUTINE W REFLEX MICROSCOPIC
BACTERIA UA: NONE SEEN
Bilirubin Urine: NEGATIVE
GLUCOSE, UA: NEGATIVE mg/dL
Hgb urine dipstick: NEGATIVE
Ketones, ur: NEGATIVE mg/dL
Leukocytes, UA: NEGATIVE
Nitrite: NEGATIVE
PH: 6 (ref 5.0–8.0)
Protein, ur: NEGATIVE mg/dL
RBC / HPF: NONE SEEN RBC/hpf (ref 0–5)
SQUAMOUS EPITHELIAL / LPF: NONE SEEN
Specific Gravity, Urine: 1.018 (ref 1.005–1.030)
WBC, UA: NONE SEEN WBC/hpf (ref 0–5)

## 2018-02-26 LAB — I-STAT TROPONIN, ED: Troponin i, poc: 0.01 ng/mL (ref 0.00–0.08)

## 2018-02-26 LAB — RAPID URINE DRUG SCREEN, HOSP PERFORMED
AMPHETAMINES: NOT DETECTED
BARBITURATES: NOT DETECTED
Benzodiazepines: NOT DETECTED
COCAINE: POSITIVE — AB
Opiates: NOT DETECTED
Tetrahydrocannabinol: NOT DETECTED

## 2018-02-26 MED ORDER — IOPAMIDOL (ISOVUE-370) INJECTION 76%
INTRAVENOUS | Status: AC
  Filled 2018-02-26: qty 100

## 2018-02-26 MED ORDER — GI COCKTAIL ~~LOC~~: 30.0000 mL | Freq: Once | ORAL | Status: DC

## 2018-02-26 MED ORDER — SODIUM CHLORIDE 0.9 % IJ SOLN
INTRAMUSCULAR | Status: AC
  Filled 2018-02-26: qty 50

## 2018-02-26 MED ORDER — IOPAMIDOL (ISOVUE-370) INJECTION 76%
100.0000 mL | Freq: Once | INTRAVENOUS | Status: AC | PRN
  Administered 2018-02-26: 100 mL via INTRAVENOUS

## 2018-02-26 MED ORDER — MORPHINE SULFATE (PF) 4 MG/ML IV SOLN
4.0000 mg | Freq: Once | INTRAVENOUS | Status: AC
  Administered 2018-02-26: 4 mg via INTRAVENOUS
  Filled 2018-02-26: qty 1

## 2018-02-26 NOTE — Progress Notes (Signed)
LE venous duplex prelim: negative for DVT. Roylene Heaton Eunice, RDMS, RVT  

## 2018-02-26 NOTE — ED Provider Notes (Signed)
Pt signed out to me by L Layden, PA-C.  Please see previous notes for further history.  In brief, patient presenting with chest pain and bilateral leg swelling, right greater than left.  Chest pain began 1 day ago is worse with inspiration, no change with exertion.  No PE risk factors.  He had a positive d-dimer.  He is pending a right lower leg ultrasound to rule out DVT and a CTA to rule out PE.  Plan for discharge if both are negative.  CTA negative for PE.  Ultrasound negative for DVT.  Discussed findings with patient.  Discussed importance of follow-up with primary care for further evaluation of his symptoms.  At this time, patient appears safe for discharge.  Return precautions given.  Patient states he understands and agrees to plan.     Alveria ApleyCaccavale, Ledora Delker, PA-C 02/26/18 [phone]    Little, Ambrose Finlandachel Morgan, MD 03/01/18 2050

## 2018-02-26 NOTE — ED Notes (Signed)
Pt sleeping in lobby. Not roused by voice, vitals not reassessed at this time.

## 2018-02-26 NOTE — Discharge Instructions (Signed)
Continue taking all your medication as prescribed. Use Tylenol or ibuprofen as needed for pain. Follow-up with your primary care doctor next week for further evaluation of your symptoms. Return to the emergency room if you develop difficulty breathing, worsening pain, or any new or concerning symptoms.

## 2018-02-26 NOTE — ED Provider Notes (Signed)
[facility]-EMERGENCY DEPT Provider Note   CSN: [id number] Arrival date & time: 02/25/18  2217     History   Chief Complaint Chief Complaint  Patient presents with  . Chest Pain  . Leg Swelling    HPI Ryan [name] is a 57 y.o. male past medical history of diabetes, drug abuse, GERD, hypertension who presents for evaluation of chest pain, bilateral lower extremity edema, right greater than left.  Patient reports that chest pain has been ongoing for 1 day.  He states that it is constant.  He states that it is a pain that radiates across his chest.  He states it is worse with deep inspiration.  Not worse with exertion.  He denies any associated nausea, vomiting, diaphoresis.  He has not taken any medications for the pain.  Patient reports that he has also had bilateral lower extremity edema that is been ongoing for the last several days.  He states that the right is greater than left.  He states that they feel "tight."  He states he has not had any overlying warmth, erythema.  He denies any preceding, trauma, injury.  Patient also complaining of some left-sided neck pain that he says is a constant pain.  He was evaluated in the ED on 02/22/18 for same neck pain. He denies any steroid use, recent immobilization, prior history of DVT/PE, recent surgery, leg swelling, or long travel. Patient denies any personal cardiac history. He denies any family cardiac history. He denies any cocaine, heroine, marijuana. He states he used to smoke.    The history is provided by the patient.    Past Medical History:  Diagnosis Date  . Achilles tendon rupture right  . Arthritis    back and shoulders  . Chest pain    due to cocaine use  . Diabetes mellitus   . Diabetes mellitus without complication (HCC)   . Drug abuse (HCC)    cocaine, marijuana abuse  . GERD (gastroesophageal reflux disease)   . Hypertension   . Renal insufficiency     Patient Active Problem List   Diagnosis Date Noted  . Right ankle pain 05/15/2014  . Need for Tdap vaccination 05/15/2014  . Unspecified essential hypertension 05/14/2014  . Diabetes mellitus (HCC) 05/14/2014  . Rupture of right Achilles tendon 05/02/2014  . Tendon rupture, Achilles 05/02/2014    Past Surgical History:  Procedure Laterality Date  . ACHILLES TENDON SURGERY Right 05/02/2014   Procedure: RIGHT ACHILLES TENDON REPAIR;  Surgeon: Javier Docker, MD;  Location: [facility];  Service: Orthopedics;  Laterality: Right;  . CARDIAC CATHETERIZATION  05.31.2012   showing patent coronaries with no visualized vasospasm and EF of 55-60 %  . ORBITAL FRACTURE SURGERY Left 2-3 yrs ago       Home Medications    Prior to Admission medications   Medication Sig Start Date End Date Taking? Authorizing Provider  acetaminophen (TYLENOL) 325 MG tablet Take 2 tablets (650 mg total) by mouth every 6 (six) hours as needed. 02/14/18   Hedges, Tinnie Gens, PA-C  amLODipine (NORVASC) 10 MG tablet Take 10 mg by mouth every morning.     [provider]  carvedilol (COREG) 25 MG tablet Take 25 mg by mouth 2 (two) times daily.     [provider]  docusate sodium (COLACE) 100 MG capsule Take 1 capsule (100 mg total) by mouth 2 (two) times daily. Patient not taking: Reported on 10/19/2016 05/02/14   Dorothy Spark, PA-C  furosemide (  LASIX) 20 MG tablet Take 1 tablet (20 mg total) by mouth daily. 05/15/17   Jacalyn LefevreHaviland, Julie, MD  HYDROcodone-acetaminophen (NORCO/VICODIN) 5-325 MG tablet Take 1 tablet by mouth every 6 (six) hours as needed for moderate pain or severe pain. 12/21/17   Mardella LaymanHagler, Brian, MD  insulin NPH Human (HUMULIN N,NOVOLIN N) 100 UNIT/ML injection Inject 0.2 mLs (20 Units total) into the skin 2 (two) times daily. 12/21/17   Mardella LaymanHagler, Brian, MD  insulin NPH-regular Human (NOVOLIN 70/30) (70-30) 100 UNIT/ML injection Take a directed. 12/21/17   Mardella LaymanHagler, Brian, MD  lisinopril-hydrochlorothiazide (PRINZIDE,ZESTORETIC) 20-25 MG  per tablet Take 1 tablet by mouth every morning.     [provider]  methocarbamol (ROBAXIN) 500 MG tablet Take 1 tablet (500 mg total) by mouth 2 (two) times daily as needed. 02/23/18   Ward, Chase PicketJaime Pilcher, PA-C  naproxen (NAPROSYN) 375 MG tablet Take 1 tablet (375 mg total) by mouth 2 (two) times daily as needed. 02/23/18   Ward, Chase PicketJaime Pilcher, PA-C  penicillin v potassium (VEETID) 500 MG tablet Take 1 tablet (500 mg total) by mouth 4 (four) times daily. Patient not taking: Reported on 10/19/2016 07/15/16   Roxy HorsemanBrowning, Robert, PA-C  traMADol (ULTRAM) 50 MG tablet Take 1 tablet (50 mg total) by mouth every 8 (eight) hours as needed. 11/22/17   Ward, Chase PicketJaime Pilcher, PA-C    Family History No family history on file.  Social History Social History   Tobacco Use  . Smoking status: Never Smoker  . Smokeless tobacco: Never Used  Substance Use Topics  . Alcohol use: Yes    Comment: occassional  . Drug use: Yes    Types: Cocaine, Marijuana    Comment: last cocaine use 2 to 3 yrs ago, last marijuana use 4 to 5 years ago     Allergies   Patient has no known allergies.   Review of Systems Review of Systems  Constitutional: Negative for chills and fever.  HENT: Negative for congestion.   Eyes: Negative for visual disturbance.  Respiratory: Negative for cough and shortness of breath.   Cardiovascular: Positive for chest pain and leg swelling.  Gastrointestinal: Negative for abdominal pain, diarrhea, nausea and vomiting.  Genitourinary: Negative for dysuria and hematuria.  Musculoskeletal: Positive for neck pain (chronic). Negative for back pain.  Skin: Negative for rash.  Neurological: Negative for dizziness, weakness, numbness and headaches.  Psychiatric/Behavioral: Negative for confusion.     Physical Exam Updated Vital Signs BP 140/87   Pulse 72   Temp (!) 97.5 F (36.4 C) (Oral)   Resp 18   SpO2 96%   Physical Exam  Constitutional: He is oriented to person, place,  and time. He appears well-developed and well-nourished.  HENT:  Head: Normocephalic and atraumatic.  Mouth/Throat: Oropharynx is clear and moist and mucous membranes are normal.  Eyes: Conjunctivae, EOM and lids are normal. Pupils are equal, round, and reactive to light.  Neck: Full passive range of motion without pain.    There is some muscular tenderness overlying the paraspinal muscles of the left cervical region.  No midline bony tenderness.  No deformity or crepitus noted.  Flexion/extension and lateral movement of neck intact without any difficulty.  Cardiovascular: Normal rate, regular rhythm, normal heart sounds and normal pulses. Exam reveals no gallop and no friction rub.  No murmur heard. Pulses:      Dorsalis pedis pulses are 2+ on the right side, and 2+ on the left side.  Pulmonary/Chest: Effort normal and breath sounds normal.  No evidence of respiratory distress. Able to speak in full sentences without difficulty.  Tenderness palpation the anterior chest wall.  No deformity or crepitus noted.  Pain is reproduced with palpation of anterior chest wall.  Abdominal: Soft. Normal appearance. There is no tenderness. There is no rigidity and no guarding.  Abdomen is soft, nondistended, nontender.  Musculoskeletal: Normal range of motion.  2+ pitting edema noted to bilateral lower extremities that begins at the mid tib-fib area and extends distally.  Right is slightly greater than the left.  No overlying warmth, erythema.  Neurological: He is alert and oriented to person, place, and time.  Follows commands, Moves all extremities  5/5 strength to BUE and BLE  Sensation intact throughout all major nerve distributions  Skin: Skin is warm and dry. Capillary refill takes less than 2 seconds.  Psychiatric: He has a normal mood and affect. His speech is normal.  Nursing note and vitals reviewed.    ED Treatments / Results  Labs (all labs ordered are listed, but only abnormal results  are displayed) Labs Reviewed  CBC WITH DIFFERENTIAL/PLATELET  BASIC METABOLIC PANEL  D-DIMER, QUANTITATIVE (NOT AT [facility])  I-STAT TROPONIN, ED    EKG  EKG Interpretation None       Radiology No results found.  Procedures Procedures (including critical care time)  Medications Ordered in ED Medications - No data to display   Initial Impression / Assessment and Plan / ED Course  I have reviewed the triage vital signs and the nursing notes.  Pertinent labs & imaging results that were available during my care of the patient were reviewed by me and considered in my medical decision making (see chart for details).     57 year old male who presents for evaluation of chest pain times 1 day and bilateral lower extremity edema that is been going on for the last several days.  Chest pain worse with deep inspiration but not exertion.  No fevers, chills.  Bilateral lower extremity edema without any warmth, erythema.  Right is greater than left.  No PE risk factors. Patient is afebrile, non-toxic appearing, sitting comfortably on examination table. Vital signs reviewed and stable.  On physical exam, patient does have bilateral lower extremity edema, right greater than left.  No overlying warmth, erythema.  Good distal pulses.  Patient is neurovascularly intact.  Consider ACS etiology versus acute infectious etiology versus electrolyte imbalance.  Patient does not have risk factors for PE but given exam, will check d-dimer, bilateral lower extremity ultrasound.  Analgesics provided in the ED.  Patient signed out to Alveria Apley, PA-C with labs and imaging pending. Please see her note for full ED course.   Final Clinical Impressions(s) / ED Diagnoses   Final diagnoses:  Atypical chest pain  Bilateral lower extremity edema    ED Discharge Orders    None       Rosana Hoes 02/26/18 2122    Geoffery Lyons, MD 02/27/18 [phone]

## 2018-03-04 ENCOUNTER — Other Ambulatory Visit: Payer: Self-pay

## 2018-03-04 ENCOUNTER — Emergency Department (HOSPITAL_COMMUNITY): Payer: Self-pay

## 2018-03-04 ENCOUNTER — Encounter (HOSPITAL_COMMUNITY): Payer: Self-pay | Admitting: Emergency Medicine

## 2018-03-04 DIAGNOSIS — R0602 Shortness of breath: Secondary | ICD-10-CM | POA: Insufficient documentation

## 2018-03-04 DIAGNOSIS — Z5321 Procedure and treatment not carried out due to patient leaving prior to being seen by health care provider: Secondary | ICD-10-CM | POA: Insufficient documentation

## 2018-03-04 DIAGNOSIS — R0789 Other chest pain: Secondary | ICD-10-CM | POA: Insufficient documentation

## 2018-03-04 NOTE — ED Triage Notes (Signed)
Pt c/o 7/10 bilateral cp that started this morning with some SOB, no nausea or vomiting, no radiating pain.

## 2018-03-05 ENCOUNTER — Emergency Department (HOSPITAL_COMMUNITY)
Admission: EM | Admit: 2018-03-05 | Discharge: 2018-03-05 | Disposition: A | Discharge status: Home / Self Care | Payer: Self-pay | Attending: Emergency Medicine | Admitting: Emergency Medicine

## 2018-03-05 LAB — BASIC METABOLIC PANEL
Anion gap: 13 (ref 5–15)
BUN: 16 mg/dL (ref 6–20)
CHLORIDE: 107 mmol/L (ref 101–111)
CO2: 15 mmol/L — ABNORMAL LOW (ref 22–32)
CREATININE: 1.44 mg/dL — AB (ref 0.61–1.24)
Calcium: 8.3 mg/dL — ABNORMAL LOW (ref 8.9–10.3)
GFR calc Af Amer: >60 mL/min (ref >60)
GFR, EST NON AFRICAN AMERICAN: 54 mL/min — AB (ref >60)
GLUCOSE: 121 mg/dL — AB (ref 65–99)
POTASSIUM: 3.9 mmol/L (ref 3.5–5.1)
SODIUM: 135 mmol/L (ref 135–145)

## 2018-03-05 LAB — I-STAT TROPONIN, ED: Troponin i, poc: 0.01 ng/mL (ref 0.00–0.08)

## 2018-03-05 LAB — CBC
HEMATOCRIT: 38 % — AB (ref 39.0–52.0)
Hemoglobin: 12.3 g/dL — ABNORMAL LOW (ref 13.0–17.0)
MCH: 25.7 pg — ABNORMAL LOW (ref 26.0–34.0)
MCHC: 32.4 g/dL (ref 30.0–36.0)
MCV: 79.5 fL (ref 78.0–100.0)
PLATELETS: 265 10*3/uL (ref 150–400)
RBC: 4.78 MIL/uL (ref 4.22–5.81)
RDW: 16.8 % — AB (ref 11.5–15.5)
WBC: 9.3 10*3/uL (ref 4.0–10.5)

## 2018-03-05 NOTE — ED Notes (Signed)
No answer x1 for treatment room 

## 2019-02-19 ENCOUNTER — Other Ambulatory Visit: Payer: Self-pay

## 2019-02-19 ENCOUNTER — Encounter (HOSPITAL_COMMUNITY): Payer: Self-pay | Admitting: Emergency Medicine

## 2019-02-19 ENCOUNTER — Emergency Department (HOSPITAL_COMMUNITY): Payer: Self-pay

## 2019-02-19 ENCOUNTER — Emergency Department (HOSPITAL_COMMUNITY)
Admission: EM | Admit: 2019-02-19 | Discharge: 2019-02-19 | Disposition: A | Discharge status: Home / Self Care | Payer: Self-pay | Attending: Emergency Medicine | Admitting: Emergency Medicine

## 2019-02-19 DIAGNOSIS — K209 Esophagitis, unspecified without bleeding: Secondary | ICD-10-CM

## 2019-02-19 DIAGNOSIS — Z79899 Other long term (current) drug therapy: Secondary | ICD-10-CM | POA: Insufficient documentation

## 2019-02-19 DIAGNOSIS — I10 Essential (primary) hypertension: Secondary | ICD-10-CM | POA: Insufficient documentation

## 2019-02-19 DIAGNOSIS — E119 Type 2 diabetes mellitus without complications: Secondary | ICD-10-CM | POA: Insufficient documentation

## 2019-02-19 DIAGNOSIS — R1013 Epigastric pain: Secondary | ICD-10-CM

## 2019-02-19 DIAGNOSIS — Z794 Long term (current) use of insulin: Secondary | ICD-10-CM | POA: Insufficient documentation

## 2019-02-19 LAB — COMPREHENSIVE METABOLIC PANEL
ALT: 24 U/L (ref 0–44)
AST: 32 U/L (ref 15–41)
Albumin: 3.2 g/dL — ABNORMAL LOW (ref 3.5–5.0)
Alkaline Phosphatase: 71 U/L (ref 38–126)
Anion gap: 13 (ref 5–15)
BUN: 14 mg/dL (ref 6–20)
CO2: 27 mmol/L (ref 22–32)
Calcium: 8.5 mg/dL — ABNORMAL LOW (ref 8.9–10.3)
Chloride: 96 mmol/L — ABNORMAL LOW (ref 98–111)
Creatinine, Ser: 1.59 mg/dL — ABNORMAL HIGH (ref 0.61–1.24)
GFR calc Af Amer: 56 mL/min — ABNORMAL LOW (ref >60)
GFR calc non Af Amer: 48 mL/min — ABNORMAL LOW (ref >60)
Glucose, Bld: 129 mg/dL — ABNORMAL HIGH (ref 70–99)
Potassium: 3 mmol/L — ABNORMAL LOW (ref 3.5–5.1)
Sodium: 136 mmol/L (ref 135–145)
Total Bilirubin: 0.4 mg/dL (ref 0.3–1.2)
Total Protein: 6.6 g/dL (ref 6.5–8.1)

## 2019-02-19 LAB — CBC WITH DIFFERENTIAL/PLATELET
Abs Immature Granulocytes: 0.01 10*3/uL (ref 0.00–0.07)
Basophils Absolute: 0 10*3/uL (ref 0.0–0.1)
Basophils Relative: 1 %
Eosinophils Absolute: 0.1 10*3/uL (ref 0.0–0.5)
Eosinophils Relative: 1 %
HCT: 43.4 % (ref 39.0–52.0)
Hemoglobin: 14.1 g/dL (ref 13.0–17.0)
Immature Granulocytes: 0 %
Lymphocytes Relative: 46 %
Lymphs Abs: 2.8 10*3/uL (ref 0.7–4.0)
MCH: 27.8 pg (ref 26.0–34.0)
MCHC: 32.5 g/dL (ref 30.0–36.0)
MCV: 85.6 fL (ref 80.0–100.0)
Monocytes Absolute: 0.8 10*3/uL (ref 0.1–1.0)
Monocytes Relative: 14 %
Neutro Abs: 2.3 10*3/uL (ref 1.7–7.7)
Neutrophils Relative %: 38 %
Platelets: 174 10*3/uL (ref 150–400)
RBC: 5.07 MIL/uL (ref 4.22–5.81)
RDW: 15.9 % — ABNORMAL HIGH (ref 11.5–15.5)
WBC: 6.1 10*3/uL (ref 4.0–10.5)
nRBC: 0 % (ref 0.0–0.2)

## 2019-02-19 LAB — TROPONIN I
Troponin I: <0.03 ng/mL (ref <0.03)
Troponin I: <0.03 ng/mL (ref <0.03)

## 2019-02-19 LAB — LIPASE, BLOOD: Lipase: 27 U/L (ref 11–51)

## 2019-02-19 MED ORDER — LIDOCAINE VISCOUS HCL 2 % MT SOLN
15.0000 mL | Freq: Once | OROMUCOSAL | Status: AC
  Administered 2019-02-19: 15 mL via ORAL
  Filled 2019-02-19: qty 15

## 2019-02-19 MED ORDER — POTASSIUM CHLORIDE CRYS ER 20 MEQ PO TBCR
60.0000 meq | EXTENDED_RELEASE_TABLET | Freq: Once | ORAL | Status: AC
  Administered 2019-02-19: 60 meq via ORAL
  Filled 2019-02-19: qty 3

## 2019-02-19 MED ORDER — PANTOPRAZOLE SODIUM 20 MG PO TBEC: 20.0000 mg | DELAYED_RELEASE_TABLET | Freq: Two times a day (BID) | ORAL | 0 refills | Status: DC

## 2019-02-19 MED ORDER — ALUM & MAG HYDROXIDE-SIMETH 200-200-20 MG/5ML PO SUSP
30.0000 mL | Freq: Once | ORAL | Status: AC
  Administered 2019-02-19: 30 mL via ORAL
  Filled 2019-02-19: qty 30

## 2019-02-19 MED ORDER — IOHEXOL 300 MG/ML  SOLN: 30.0000 mL | Freq: Once | INTRAMUSCULAR | Status: DC | PRN

## 2019-02-19 MED ORDER — HYDROMORPHONE HCL 1 MG/ML IJ SOLN
1.0000 mg | Freq: Once | INTRAMUSCULAR | Status: AC
  Administered 2019-02-19: 1 mg via INTRAVENOUS
  Filled 2019-02-19: qty 1

## 2019-02-19 MED ORDER — FAMOTIDINE IN NACL 20-0.9 MG/50ML-% IV SOLN
20.0000 mg | Freq: Once | INTRAVENOUS | Status: AC
  Administered 2019-02-19: 20 mg via INTRAVENOUS
  Filled 2019-02-19: qty 50

## 2019-02-19 NOTE — ED Notes (Signed)
This RN called CT to let them know pt finished oral contrast.

## 2019-02-19 NOTE — ED Provider Notes (Signed)
[facility] EMERGENCY DEPARTMENT Provider Note   CSN: [id number] Arrival date & time: 02/19/19  1607    History   Chief Complaint Chief Complaint  Patient presents with  . Chest Pain    HPI Ryan [name] is a 58 y.o. male.     HPI   58 year old male with epigastric pain.  Onset last night.  Constant since then.  "It just hurts."  No appreciable exacerbating relieving factors.  He feels like he cannot get a deep breath because of the pain.  No coughing.  No fevers or chills.  No nausea, palpitations or diaphoresis.  Past Medical History:  Diagnosis Date  . Achilles tendon rupture right  . Arthritis    back and shoulders  . Chest pain    due to cocaine use  . Diabetes mellitus   . Diabetes mellitus without complication (HCC)   . Drug abuse (HCC)    cocaine, marijuana abuse  . GERD (gastroesophageal reflux disease)   . Hypertension   . Renal insufficiency     Patient Active Problem List   Diagnosis Date Noted  . Right ankle pain 05/15/2014  . Need for Tdap vaccination 05/15/2014  . Unspecified essential hypertension 05/14/2014  . Diabetes mellitus (HCC) 05/14/2014  . Rupture of right Achilles tendon 05/02/2014  . Tendon rupture, Achilles 05/02/2014    Past Surgical History:  Procedure Laterality Date  . ACHILLES TENDON SURGERY Right 05/02/2014   Procedure: RIGHT ACHILLES TENDON REPAIR;  Surgeon: Javier Docker, MD;  Location: [facility];  Service: Orthopedics;  Laterality: Right;  . CARDIAC CATHETERIZATION  05.31.2012   showing patent coronaries with no visualized vasospasm and EF of 55-60 %  . ORBITAL FRACTURE SURGERY Left 2-3 yrs ago        Home Medications    Prior to Admission medications   Medication Sig Start Date End Date Taking? Authorizing Provider  acetaminophen (TYLENOL) 325 MG tablet Take 2 tablets (650 mg total) by mouth every 6 (six) hours as needed. 02/14/18  Yes Hedges, Tinnie Gens, PA-C  amLODipine (NORVASC) 10 MG tablet Take  10 mg by mouth every morning.    Yes [provider]  carvedilol (COREG) 25 MG tablet Take 25 mg by mouth 2 (two) times daily.    Yes [provider]  insulin NPH-regular Human (NOVOLIN 70/30) (70-30) 100 UNIT/ML injection Take a directed. Patient taking differently: Inject 20 Units into the skin 2 (two) times daily with a meal. Take a directed. 12/21/17  Yes Hagler, Arlys John, MD  lisinopril-hydrochlorothiazide (PRINZIDE,ZESTORETIC) 20-25 MG per tablet Take 1 tablet by mouth every morning.    Yes [provider]  docusate sodium (COLACE) 100 MG capsule Take 1 capsule (100 mg total) by mouth 2 (two) times daily. Patient not taking: Reported on 10/19/2016 05/02/14   Dorothy Spark, PA-C  furosemide (LASIX) 20 MG tablet Take 1 tablet (20 mg total) by mouth daily. Patient not taking: Reported on 02/26/2018 05/15/17   Jacalyn Lefevre, MD  HYDROcodone-acetaminophen (NORCO/VICODIN) 5-325 MG tablet Take 1 tablet by mouth every 6 (six) hours as needed for moderate pain or severe pain. Patient not taking: Reported on 02/26/2018 12/21/17   Mardella Layman, MD  insulin NPH Human (HUMULIN N,NOVOLIN N) 100 UNIT/ML injection Inject 0.2 mLs (20 Units total) into the skin 2 (two) times daily. Patient not taking: Reported on 02/19/2019 12/21/17   Mardella Layman, MD  methocarbamol (ROBAXIN) 500 MG tablet Take 1 tablet (500 mg total) by mouth 2 (two) times  daily as needed. Patient not taking: Reported on 02/19/2019 02/23/18   Ward, Chase Picket, PA-C  naproxen (NAPROSYN) 375 MG tablet Take 1 tablet (375 mg total) by mouth 2 (two) times daily as needed. Patient not taking: Reported on 02/19/2019 02/23/18   Ward, Chase Picket, PA-C  penicillin v potassium (VEETID) 500 MG tablet Take 1 tablet (500 mg total) by mouth 4 (four) times daily. Patient not taking: Reported on 10/19/2016 07/15/16   Roxy Horseman, PA-C  traMADol (ULTRAM) 50 MG tablet Take 1 tablet (50 mg total) by mouth every 8 (eight) hours as  needed. Patient not taking: Reported on 02/26/2018 11/22/17   Ward, Chase Picket, PA-C    Family History History reviewed. No pertinent family history.  Social History Social History   Tobacco Use  . Smoking status: Never Smoker  . Smokeless tobacco: Never Used  Substance Use Topics  . Alcohol use: Yes    Comment: occassional  . Drug use: Yes    Types: Cocaine, Marijuana    Comment: last cocaine use 2 to 3 yrs ago, last marijuana use 4 to 5 years ago     Allergies   Patient has no known allergies.   Review of Systems Review of Systems   Physical Exam Updated Vital Signs BP (!) 150/97   Pulse 79   Temp 98.4 F (36.9 C)   Resp 20   SpO2 94%   Physical Exam Vitals signs and nursing note reviewed.  Constitutional:      General: He is not in acute distress.    Appearance: He is well-developed.  HENT:     Head: Normocephalic and atraumatic.  Eyes:     General:        Right eye: No discharge.        Left eye: No discharge.     Conjunctiva/sclera: Conjunctivae normal.  Neck:     Musculoskeletal: Neck supple.  Cardiovascular:     Rate and Rhythm: Normal rate and regular rhythm.     Heart sounds: Normal heart sounds. No murmur. No friction rub. No gallop.   Pulmonary:     Effort: Pulmonary effort is normal. No respiratory distress.     Breath sounds: Normal breath sounds.  Abdominal:     General: There is no distension.     Palpations: Abdomen is soft.     Tenderness: There is abdominal tenderness.     Comments: Periumbilical epigastric tenderness without rebound or guarding.  No distention.  Musculoskeletal:        General: No tenderness.  Skin:    General: Skin is warm and dry.  Neurological:     Mental Status: He is alert.  Psychiatric:        Behavior: Behavior normal.        Thought Content: Thought content normal.      ED Treatments / Results  Labs (all labs ordered are listed, but only abnormal results are displayed) Labs Reviewed   COMPREHENSIVE METABOLIC PANEL - Abnormal; Notable for the following components:      Result Value   Potassium 3.0 (*)    Chloride 96 (*)    Glucose, Bld 129 (*)    Creatinine, Ser 1.59 (*)    Calcium 8.5 (*)    Albumin 3.2 (*)    GFR calc non Af Amer 48 (*)    GFR calc Af Amer 56 (*)    All other components within normal limits  CBC WITH DIFFERENTIAL/PLATELET - Abnormal; Notable for the following  components:   RDW 15.9 (*)    All other components within normal limits  TROPONIN I  LIPASE, BLOOD  TROPONIN I    EKG EKG Interpretation  Date/Time:  Monday February 19 2019 07:36:59 EDT Ventricular Rate:  71 PR Interval:    QRS Duration: 79 QT Interval:  379 QTC Calculation: 412 R Axis:   34 Text Interpretation:  Sinus rhythm Borderline low voltage, extremity leads Nonspecific T abnormalities, lateral leads similar to prior EKG from 03/04/18 Confirmed by Raeford RazorKohut, Riti Rollyson [phone]) on 02/19/2019 7:56:06 AM   Radiology Dg Chest 2 View  Result Date: 02/19/2019 CLINICAL DATA:  Chest pain and shortness of breath EXAM: CHEST - 2 VIEW COMPARISON:  March 04, 2018 FINDINGS: Lungs are clear. The heart size and pulmonary vascularity are normal. No adenopathy. No pneumothorax. There is degenerative change in the lower thoracic region. IMPRESSION: No edema or consolidation. Electronically Signed   By: Bretta BangWilliam  Woodruff III M.D.   On: 02/19/2019 08:29    Procedures Procedures (including critical care time)  Medications Ordered in ED Medications  iohexol (OMNIPAQUE) 300 MG/ML solution 30 mL (has no administration in time range)  famotidine (PEPCID) IVPB 20 mg premix (0 mg Intravenous Stopped 02/19/19 0908)  HYDROmorphone (DILAUDID) injection 1 mg (1 mg Intravenous Given 02/19/19 0758)  alum & mag hydroxide-simeth (MAALOX/MYLANTA) 200-200-20 MG/5ML suspension 30 mL (30 mLs Oral Given 02/19/19 0756)    And  lidocaine (XYLOCAINE) 2 % viscous mouth solution 15 mL (15 mLs Oral Given 02/19/19 0756)  potassium  chloride SA (K-DUR,KLOR-CON) CR tablet 60 mEq (60 mEq Oral Given 02/19/19 0921)     Initial Impression / Assessment and Plan / ED Course  I have reviewed the triage vital signs and the nursing notes.  Pertinent labs & imaging results that were available during my care of the patient were reviewed by me and considered in my medical decision making (see chart for details).        58 year old male with upper abdominal pain.  Gastritis, peptic ulcer disease, biliary colic, cholelithiasis, cholecystitis, cholangitis, hepatitis, renal colic, urinary tract infection, colitis, constipation, gastroenteritis, atypical ACS, mesenteric ischemia all considered among other etiologies in the patient's differential diagnosis.   Tenderness on exam, but no peritonitis.  He states that he drinks a couple times a week.  His lipase is normal though.  Denies significant NSAID usage.  Atypical symptoms for ACS given location and constant duration since this morning.  Initial troponin is normal.  Will repeat.  EKG without overt ischemic changes.  Chest x-ray without acute abnormality.  He is afebrile.  Final Clinical Impressions(s) / ED Diagnoses   Final diagnoses:  Esophagitis  Epigastric pain    ED Discharge Orders    None       Raeford RazorKohut, Hollis Tuller, MD 03/02/19 1248

## 2019-02-19 NOTE — ED Triage Notes (Signed)
Pt reports constant generalized CP that began last night with some SOB.

## 2019-02-19 NOTE — ED Notes (Signed)
Discharge instructions and prescription discussed with Pt. Pt verbalized understanding. Pt stable and ambulatory.    

## 2019-05-01 ENCOUNTER — Other Ambulatory Visit: Payer: Self-pay

## 2019-05-01 ENCOUNTER — Emergency Department (HOSPITAL_COMMUNITY)
Admission: EM | Admit: 2019-05-01 | Discharge: 2019-05-01 | Disposition: A | Discharge status: Home / Self Care | Payer: Self-pay | Attending: Emergency Medicine | Admitting: Emergency Medicine

## 2019-05-01 ENCOUNTER — Emergency Department (HOSPITAL_COMMUNITY): Payer: Self-pay

## 2019-05-01 ENCOUNTER — Encounter (HOSPITAL_COMMUNITY): Payer: Self-pay | Admitting: Emergency Medicine

## 2019-05-01 DIAGNOSIS — R1084 Generalized abdominal pain: Secondary | ICD-10-CM | POA: Insufficient documentation

## 2019-05-01 DIAGNOSIS — I10 Essential (primary) hypertension: Secondary | ICD-10-CM | POA: Insufficient documentation

## 2019-05-01 DIAGNOSIS — Z79899 Other long term (current) drug therapy: Secondary | ICD-10-CM | POA: Insufficient documentation

## 2019-05-01 DIAGNOSIS — Z794 Long term (current) use of insulin: Secondary | ICD-10-CM | POA: Insufficient documentation

## 2019-05-01 DIAGNOSIS — E119 Type 2 diabetes mellitus without complications: Secondary | ICD-10-CM | POA: Insufficient documentation

## 2019-05-01 LAB — LIPASE, BLOOD: Lipase: 28 U/L (ref 11–51)

## 2019-05-01 LAB — COMPREHENSIVE METABOLIC PANEL
ALT: 23 U/L (ref 0–44)
AST: 24 U/L (ref 15–41)
Albumin: 3.6 g/dL (ref 3.5–5.0)
Alkaline Phosphatase: 68 U/L (ref 38–126)
Anion gap: 11 (ref 5–15)
BUN: 19 mg/dL (ref 6–20)
CO2: 20 mmol/L — ABNORMAL LOW (ref 22–32)
Calcium: 8.7 mg/dL — ABNORMAL LOW (ref 8.9–10.3)
Chloride: 105 mmol/L (ref 98–111)
Creatinine, Ser: 1.8 mg/dL — ABNORMAL HIGH (ref 0.61–1.24)
GFR calc Af Amer: 48 mL/min — ABNORMAL LOW (ref >60)
GFR calc non Af Amer: 41 mL/min — ABNORMAL LOW (ref >60)
Glucose, Bld: 152 mg/dL — ABNORMAL HIGH (ref 70–99)
Potassium: 3.5 mmol/L (ref 3.5–5.1)
Sodium: 136 mmol/L (ref 135–145)
Total Bilirubin: 0.8 mg/dL (ref 0.3–1.2)
Total Protein: 7.3 g/dL (ref 6.5–8.1)

## 2019-05-01 LAB — CBC WITH DIFFERENTIAL/PLATELET
Abs Immature Granulocytes: 0.02 10*3/uL (ref 0.00–0.07)
Basophils Absolute: 0 10*3/uL (ref 0.0–0.1)
Basophils Relative: 1 %
Eosinophils Absolute: 0.1 10*3/uL (ref 0.0–0.5)
Eosinophils Relative: 2 %
HCT: 41.1 % (ref 39.0–52.0)
Hemoglobin: 14.3 g/dL (ref 13.0–17.0)
Immature Granulocytes: 0 %
Lymphocytes Relative: 47 %
Lymphs Abs: 2.6 10*3/uL (ref 0.7–4.0)
MCH: 29.5 pg (ref 26.0–34.0)
MCHC: 34.8 g/dL (ref 30.0–36.0)
MCV: 84.9 fL (ref 80.0–100.0)
Monocytes Absolute: 0.8 10*3/uL (ref 0.1–1.0)
Monocytes Relative: 14 %
Neutro Abs: 1.9 10*3/uL (ref 1.7–7.7)
Neutrophils Relative %: 36 %
Platelets: 203 10*3/uL (ref 150–400)
RBC: 4.84 MIL/uL (ref 4.22–5.81)
RDW: 14.8 % (ref 11.5–15.5)
WBC: 5.5 10*3/uL (ref 4.0–10.5)
nRBC: 0 % (ref 0.0–0.2)

## 2019-05-01 LAB — URINALYSIS, ROUTINE W REFLEX MICROSCOPIC
Bilirubin Urine: NEGATIVE
Glucose, UA: NEGATIVE mg/dL
Hgb urine dipstick: NEGATIVE
Ketones, ur: NEGATIVE mg/dL
Leukocytes,Ua: NEGATIVE
Nitrite: NEGATIVE
Protein, ur: NEGATIVE mg/dL
Specific Gravity, Urine: 1.004 — ABNORMAL LOW (ref 1.005–1.030)
pH: 5 (ref 5.0–8.0)

## 2019-05-01 MED ORDER — IOHEXOL 300 MG/ML  SOLN
100.0000 mL | Freq: Once | INTRAMUSCULAR | Status: AC | PRN
  Administered 2019-05-01: 100 mL via INTRAVENOUS

## 2019-05-01 MED ORDER — MORPHINE SULFATE (PF) 4 MG/ML IV SOLN
4.0000 mg | Freq: Once | INTRAVENOUS | Status: AC
  Administered 2019-05-01: 4 mg via INTRAVENOUS
  Filled 2019-05-01: qty 1

## 2019-05-01 MED ORDER — ONDANSETRON HCL 4 MG/2ML IJ SOLN
4.0000 mg | Freq: Once | INTRAMUSCULAR | Status: AC
  Administered 2019-05-01: 4 mg via INTRAVENOUS
  Filled 2019-05-01: qty 2

## 2019-05-01 MED ORDER — MORPHINE SULFATE (PF) 4 MG/ML IV SOLN
4.0000 mg | Freq: Once | INTRAVENOUS | Status: AC
  Administered 2019-05-01: 11:00:00 4 mg via INTRAVENOUS
  Filled 2019-05-01: qty 1

## 2019-05-01 MED ORDER — FAMOTIDINE 20 MG PO TABS: 20.0000 mg | ORAL_TABLET | Freq: Two times a day (BID) | ORAL | 0 refills | Status: DC

## 2019-05-01 NOTE — ED Triage Notes (Addendum)
Pain in lower abd states has a hernia pt arrives via EMS states has been out of  meds for diabets x 1 month

## 2019-05-01 NOTE — Discharge Instructions (Signed)
°  Diet: Start with a clear liquid diet, progressed to a full liquid diet, and then bland solids as you are able. Please adhere to the enclosed dietary suggestions.  In general, avoid NSAIDs (i.e. ibuprofen, naproxen, etc.), caffeine, alcohol, spicy foods, fatty foods, or any other foods that seem to cause your symptoms to arise.  Protonix: Take this medication daily, 20-30 minutes prior to your first meal, for the next 8 weeks.  Continue to take this medication even if you begin to feel better.  Pepcid: Take this medication twice a day for the next 5 days.  Follow-up: Please follow-up with your primary care provider on this matter.  Return: Return to the ED for significantly worsening symptoms, persistent vomiting, persistent fever, vomiting blood, blood in the stools, dark stools, or any other major concerns.  For prescription assistance, may try using prescription discount sites or apps, such as goodrx.com

## 2019-05-01 NOTE — ED Notes (Signed)
Specialist this pt goes to is [facility] [phone].

## 2019-05-01 NOTE — ED Provider Notes (Signed)
[facility] EMERGENCY DEPARTMENT Provider Note   CSN: [id number] Arrival date & time: 05/01/19  6378    History   Chief Complaint Chief Complaint  Patient presents with   Hernia    HPI Ryan [name] is a 58 y.o. male.     HPI    Ryan [name] is a 58 y.o. male, with a history of DM, drug abuse, GERD, and HTN, renal insufficiency, presenting to the ED with abdominal pain beginning yesterday.  Pain is throughout the abdomen, waxing and waning, cramping and stabbing, 10/10, nonradiating from the abdomen.  After pain began, he had an instance of soft stool, but this did not change the pain.  Last food was yesterday.  Patient was seen in the ED for abdominal pain March 9.  He states his pain at that time was similar.  CT showed paraesophageal hernia with wall thickening at the GE junction.  He was referred to [facility].  He saw Dr. Dulce Sellar and was scheduled for endoscopy and follow-up appointment June 25.  When his pain began yesterday, he called the [facility] GI office and was told to come to the ED.  Denies fever/chills, N/V, constipation, hematochezia/melena, chest pain, flank/back pain, urinary symptoms, pain with swallowing, or any other complaints.    Past Medical History:  Diagnosis Date   Achilles tendon rupture right   Arthritis    back and shoulders   Chest pain    due to cocaine use   Diabetes mellitus    Diabetes mellitus without complication (HCC)    Drug abuse (HCC)    cocaine, marijuana abuse   GERD (gastroesophageal reflux disease)    Hypertension    Renal insufficiency     Patient Active Problem List   Diagnosis Date Noted   Right ankle pain 05/15/2014   Need for Tdap vaccination 05/15/2014   Unspecified essential hypertension 05/14/2014   Diabetes mellitus (HCC) 05/14/2014   Rupture of right Achilles tendon 05/02/2014   Tendon rupture, Achilles 05/02/2014    Past Surgical History:  Procedure Laterality Date    ACHILLES TENDON SURGERY Right 05/02/2014   Procedure: RIGHT ACHILLES TENDON REPAIR;  Surgeon: Javier Docker, MD;  Location: [facility] ORS;  Service: Orthopedics;  Laterality: Right;   CARDIAC CATHETERIZATION  05.31.2012   showing patent coronaries with no visualized vasospasm and EF of 55-60 %   ORBITAL FRACTURE SURGERY Left 2-3 yrs ago        Home Medications    Prior to Admission medications   Medication Sig Start Date End Date Taking? Authorizing Provider  acetaminophen (TYLENOL) 325 MG tablet Take 2 tablets (650 mg total) by mouth every 6 (six) hours as needed. 02/14/18   Hedges, Tinnie Gens, PA-C  amLODipine (NORVASC) 10 MG tablet Take 10 mg by mouth every morning.     [provider]  carvedilol (COREG) 25 MG tablet Take 25 mg by mouth 2 (two) times daily.     [provider]  docusate sodium (COLACE) 100 MG capsule Take 1 capsule (100 mg total) by mouth 2 (two) times daily. Patient not taking: Reported on 10/19/2016 05/02/14   Dorothy Spark, PA-C  famotidine (PEPCID) 20 MG tablet Take 1 tablet (20 mg total) by mouth 2 (two) times daily for 5 days. 05/01/19 05/06/19  Osric Klopf C, PA-C  furosemide (LASIX) 20 MG tablet Take 1 tablet (20 mg total) by mouth daily. Patient not taking: Reported on 02/26/2018 05/15/17   Jacalyn Lefevre, MD  HYDROcodone-acetaminophen (NORCO/VICODIN) [phone]  MG tablet Take 1 tablet by mouth every 6 (six) hours as needed for moderate pain or severe pain. Patient not taking: Reported on 02/26/2018 12/21/17   Mardella Layman, MD  insulin NPH Human (HUMULIN N,NOVOLIN N) 100 UNIT/ML injection Inject 0.2 mLs (20 Units total) into the skin 2 (two) times daily. Patient not taking: Reported on 02/19/2019 12/21/17   Mardella Layman, MD  insulin NPH-regular Human (NOVOLIN 70/30) (70-30) 100 UNIT/ML injection Take a directed. Patient taking differently: Inject 20 Units into the skin 2 (two) times daily with a meal. Take a directed. 12/21/17   Mardella Layman, MD    lisinopril-hydrochlorothiazide (PRINZIDE,ZESTORETIC) 20-25 MG per tablet Take 1 tablet by mouth every morning.     [provider]  methocarbamol (ROBAXIN) 500 MG tablet Take 1 tablet (500 mg total) by mouth 2 (two) times daily as needed. Patient not taking: Reported on 02/19/2019 02/23/18   Ward, Chase Picket, PA-C  pantoprazole (PROTONIX) 20 MG tablet Take 1 tablet (20 mg total) by mouth 2 (two) times daily. 02/19/19   Raeford Razor, MD  traMADol (ULTRAM) 50 MG tablet Take 1 tablet (50 mg total) by mouth every 8 (eight) hours as needed. Patient not taking: Reported on 02/26/2018 11/22/17   Ward, Chase Picket, PA-C    Family History No family history on file.  Social History Social History   Tobacco Use   Smoking status: Never Smoker   Smokeless tobacco: Never Used  Substance Use Topics   Alcohol use: Yes    Comment: occassional   Drug use: Yes    Types: Cocaine, Marijuana    Comment: last cocaine use 2 to 3 yrs ago, last marijuana use 4 to 5 years ago     Allergies   Patient has no known allergies.   Review of Systems Review of Systems  Constitutional: Negative for chills, diaphoresis and fever.  Respiratory: Negative for cough and shortness of breath.   Cardiovascular: Negative for chest pain.  Gastrointestinal: Positive for abdominal pain. Negative for blood in stool, constipation, nausea and vomiting.  Genitourinary: Negative for dysuria, flank pain, frequency and hematuria.  Musculoskeletal: Negative for back pain.  Neurological: Negative for syncope, weakness and numbness.  All other systems reviewed and are negative.    Physical Exam Updated Vital Signs BP 120/85    Pulse 72    Temp 98.2 F (36.8 C)    Resp 18    Ht  (1.753 m)    Wt 104.3 kg    SpO2 97%    BMI 33.97 kg/m   Physical Exam Vitals signs and nursing note reviewed.  Constitutional:      General: He is not in acute distress.    Appearance: He is well-developed. He is not  diaphoretic.  HENT:     Head: Normocephalic and atraumatic.     Mouth/Throat:     Mouth: Mucous membranes are moist.     Pharynx: Oropharynx is clear.     Comments: No noted abnormalities in the mouth or posterior pharynx that would suggest thrush. Eyes:     Conjunctiva/sclera: Conjunctivae normal.  Neck:     Musculoskeletal: Neck supple.  Cardiovascular:     Rate and Rhythm: Normal rate and regular rhythm.     Pulses: Normal pulses.          Radial pulses are 2+ on the right side and 2+ on the left side.       Posterior tibial pulses are 2+ on the right side and  2+ on the left side.     Heart sounds: Normal heart sounds.     Comments: Tactile temperature in the extremities appropriate and equal bilaterally. Pulmonary:     Effort: Pulmonary effort is normal. No respiratory distress.     Breath sounds: Normal breath sounds.  Abdominal:     Palpations: Abdomen is soft.     Tenderness: There is generalized abdominal tenderness. There is no guarding.     Comments: Patient verbally indicates generalized abdominal tenderness.  Musculoskeletal:     Right lower leg: No edema.     Left lower leg: No edema.  Lymphadenopathy:     Cervical: No cervical adenopathy.  Skin:    General: Skin is warm and dry.  Neurological:     Mental Status: He is alert.     Comments: Sensation grossly intact to light touch in the extremities.  Grip strengths equal bilaterally.  Strength 5/5 in all extremities. No gait disturbance. Coordination intact. Cranial nerves III-XII grossly intact. No facial droop.   Psychiatric:        Mood and Affect: Mood and affect normal.        Speech: Speech normal.        Behavior: Behavior normal.      ED Treatments / Results  Labs (all labs ordered are listed, but only abnormal results are displayed) Labs Reviewed  COMPREHENSIVE METABOLIC PANEL - Abnormal; Notable for the following components:      Result Value   CO2 20 (*)    Glucose, Bld 152 (*)    Creatinine,  Ser 1.80 (*)    Calcium 8.7 (*)    GFR calc non Af Amer 41 (*)    GFR calc Af Amer 48 (*)    All other components within normal limits  URINALYSIS, ROUTINE W REFLEX MICROSCOPIC - Abnormal; Notable for the following components:   Color, Urine STRAW (*)    Specific Gravity, Urine 1.004 (*)    All other components within normal limits  CBC WITH DIFFERENTIAL/PLATELET  LIPASE, BLOOD    EKG None  Radiology Ct Abdomen Pelvis W Contrast  Result Date: 05/01/2019 CLINICAL DATA:  Acute abdominal pain nausea EXAM: CT ABDOMEN AND PELVIS WITH CONTRAST TECHNIQUE: Multidetector CT imaging of the abdomen and pelvis was performed using the standard protocol following bolus administration of intravenous contrast. CONTRAST:  OMNIPAQUE IOHEXOL 300 MG/ML  SOLN COMPARISON:  CT abdomen pelvis 02/19/2019 FINDINGS: Lower chest: Moderate hiatal hernia. Thickening of the distal esophagus may represent esophagitis. This is similar to the prior study. Lung bases clear bilaterally. Heart size normal Hepatobiliary: No focal liver abnormality is seen. No gallstones, gallbladder wall thickening, or biliary dilatation. Pancreas: Negative Spleen: Negative Adrenals/Urinary Tract: Adrenal glands are unremarkable. Kidneys are normal, without renal calculi, focal lesion, or hydronephrosis. Bladder is unremarkable. Stomach/Bowel: Negative for bowel obstruction. Pan diverticulosis of the colon without diverticulitis. Normal appendix. Negative for bowel obstruction. No bowel edema. Vascular/Lymphatic: No significant vascular findings are present. No enlarged abdominal or pelvic lymph nodes. Reproductive: Mild prostate enlargement Other: Negative for hernia. Musculoskeletal: No acute skeletal abnormality. IMPRESSION: 1. Pan diverticulosis of the colon without diverticulitis. Normal appendix 2. No acute urinary tract abnormality 3. Hiatal hernia with thickening of the distal esophagus suggesting esophagitis. Electronically Signed   By:  Marlan Palau M.D.   On: 05/01/2019 13:14    Procedures Procedures (including critical care time)  Medications Ordered in ED Medications  ondansetron (ZOFRAN) injection 4 mg (4 mg Intravenous Given 05/01/19  1055)  morphine 4 MG/ML injection 4 mg (4 mg Intravenous Given 05/01/19 1054)  iohexol (OMNIPAQUE) 300 MG/ML solution 100 mL (100 mLs Intravenous Contrast Given 05/01/19 1249)  morphine 4 MG/ML injection 4 mg (4 mg Intravenous Given 05/01/19 1313)     Initial Impression / Assessment and Plan / ED Course  I have reviewed the triage vital signs and the nursing notes.  Pertinent labs & imaging results that were available during my care of the patient were reviewed by me and considered in my medical decision making (see chart for details).         Patient presents with abdominal pain.  He states the pain is similar to the pain he was experiencing when he was diagnosed with esophagitis during his last ED visit. Patient is nontoxic appearing, afebrile, not tachycardic, not tachypneic, not hypotensive, maintains excellent SPO2 on room air, and is in no apparent distress.  No leukocytosis. Alternative diagnoses were considered including, but not limited to, dissection, ischemia, infarct, but thought less likely based on patient presentation, pain onset and description, physical exam findings, and consideration of risk factors.  Patient does not present with findings suspicious for surgical abdomen. Previous imaging studies were reviewed. Some decrease in his CO2 and a slight increase in his creatinine suggest likely dehydration.  Hydration was discussed and patient preferred oral hydration. CT today again suggests esophagitis.  Patient has a follow-up appointment scheduled for June 25.  Recommend he make contact with his GI office for follow up from this ED visit. The patient was given instructions for home care as well as return precautions. Patient voices understanding of these instructions,  accepts the plan, and is comfortable with discharge.  Findings and plan of care discussed with Raeford RazorStephen Kohut, MD.   Vitals:   05/01/19 1145 05/01/19 1200 05/01/19 1215 05/01/19 1230  BP: 118/80 117/79 119/81 120/85  Pulse: 71 79 74 72  Resp:      Temp:      SpO2: 96% 95% 95% 97%  Weight:      Height:          Final Clinical Impressions(s) / ED Diagnoses   Final diagnoses:  Generalized abdominal pain    ED Discharge Orders         Ordered    famotidine (PEPCID) 20 MG tablet  2 times daily     05/01/19 1525           Anselm PancoastJoy, Abryanna Musolino C, PA-C 05/01/19 1545    Raeford RazorKohut, Stephen, MD 05/02/19 1221

## 2019-05-01 NOTE — ED Notes (Signed)
Pt aware of need for urine sample, urinal at bedside, privacy given

## 2019-05-28 ENCOUNTER — Other Ambulatory Visit: Payer: Self-pay

## 2019-05-28 ENCOUNTER — Emergency Department (HOSPITAL_COMMUNITY)
Admission: EM | Admit: 2019-05-28 | Discharge: 2019-05-28 | Disposition: A | Discharge status: Home / Self Care | Payer: Self-pay | Attending: Emergency Medicine | Admitting: Emergency Medicine

## 2019-05-28 DIAGNOSIS — Z79899 Other long term (current) drug therapy: Secondary | ICD-10-CM | POA: Insufficient documentation

## 2019-05-28 DIAGNOSIS — I10 Essential (primary) hypertension: Secondary | ICD-10-CM | POA: Insufficient documentation

## 2019-05-28 DIAGNOSIS — E876 Hypokalemia: Secondary | ICD-10-CM | POA: Insufficient documentation

## 2019-05-28 DIAGNOSIS — R531 Weakness: Secondary | ICD-10-CM | POA: Insufficient documentation

## 2019-05-28 DIAGNOSIS — Z794 Long term (current) use of insulin: Secondary | ICD-10-CM | POA: Insufficient documentation

## 2019-05-28 DIAGNOSIS — E119 Type 2 diabetes mellitus without complications: Secondary | ICD-10-CM | POA: Insufficient documentation

## 2019-05-28 DIAGNOSIS — R631 Polydipsia: Secondary | ICD-10-CM | POA: Insufficient documentation

## 2019-05-28 DIAGNOSIS — R7989 Other specified abnormal findings of blood chemistry: Secondary | ICD-10-CM

## 2019-05-28 LAB — BASIC METABOLIC PANEL
Anion gap: 15 (ref 5–15)
BUN: 19 mg/dL (ref 6–20)
CO2: 18 mmol/L — ABNORMAL LOW (ref 22–32)
Calcium: 8.5 mg/dL — ABNORMAL LOW (ref 8.9–10.3)
Chloride: 100 mmol/L (ref 98–111)
Creatinine, Ser: 2.1 mg/dL — ABNORMAL HIGH (ref 0.61–1.24)
GFR calc Af Amer: 40 mL/min — ABNORMAL LOW (ref >60)
GFR calc non Af Amer: 34 mL/min — ABNORMAL LOW (ref >60)
Glucose, Bld: 156 mg/dL — ABNORMAL HIGH (ref 70–99)
Potassium: 3 mmol/L — ABNORMAL LOW (ref 3.5–5.1)
Sodium: 133 mmol/L — ABNORMAL LOW (ref 135–145)

## 2019-05-28 LAB — CBC WITH DIFFERENTIAL/PLATELET
Abs Immature Granulocytes: 0.01 10*3/uL (ref 0.00–0.07)
Basophils Absolute: 0 10*3/uL (ref 0.0–0.1)
Basophils Relative: 0 %
Eosinophils Absolute: 0 10*3/uL (ref 0.0–0.5)
Eosinophils Relative: 0 %
HCT: 45.3 % (ref 39.0–52.0)
Hemoglobin: 15.3 g/dL (ref 13.0–17.0)
Immature Granulocytes: 0 %
Lymphocytes Relative: 38 %
Lymphs Abs: 1.2 10*3/uL (ref 0.7–4.0)
MCH: 29.8 pg (ref 26.0–34.0)
MCHC: 33.8 g/dL (ref 30.0–36.0)
MCV: 88.1 fL (ref 80.0–100.0)
Monocytes Absolute: 0.3 10*3/uL (ref 0.1–1.0)
Monocytes Relative: 9 %
Neutro Abs: 1.6 10*3/uL — ABNORMAL LOW (ref 1.7–7.7)
Neutrophils Relative %: 53 %
Platelets: 145 10*3/uL — ABNORMAL LOW (ref 150–400)
RBC: 5.14 MIL/uL (ref 4.22–5.81)
RDW: 14.6 % (ref 11.5–15.5)
WBC: 3.1 10*3/uL — ABNORMAL LOW (ref 4.0–10.5)
nRBC: 0 % (ref 0.0–0.2)

## 2019-05-28 LAB — CBG MONITORING, ED: Glucose-Capillary: 157 mg/dL — ABNORMAL HIGH (ref 70–99)

## 2019-05-28 MED ORDER — POTASSIUM CHLORIDE CRYS ER 20 MEQ PO TBCR
40.0000 meq | EXTENDED_RELEASE_TABLET | Freq: Once | ORAL | Status: AC
  Administered 2019-05-28: 40 meq via ORAL
  Filled 2019-05-28: qty 2

## 2019-05-28 MED ORDER — POTASSIUM CHLORIDE ER 20 MEQ PO TBCR: 20.0000 meq | EXTENDED_RELEASE_TABLET | Freq: Every day | ORAL | 0 refills | Status: DC

## 2019-05-28 NOTE — ED Provider Notes (Signed)
[facility] EMERGENCY [facility]PART[facility] Provider Note   CSN: [id number] Arrival date & time: 05/28/19  1417    History   Chief Complaint Chief Complaint  Patient presents with  . Weakness    HPI Cecil Cobbsnthony Brinkmeier is a 58 y.o. male.     HPI   58 year old male presents today with complaints of generalized fatigue.  Patient notes 2 days of feeling tired.  He denies any fever cough, sore throat, abdominal pain nausea vomiting, chest pain, dysuria, diarrhea.  He denies any abnormal bruising or bleeding.  He notes he has not been eating as much as normal over the last day as he does not have an appetite.  He notes normal urine.  He notes he is a diabetic but has been using his insulin.  He notes a history of hypertension and did not take his antihypertensive medication this morning.  He does note that he has been sleeping well.  He does note increased thirst.   Past Medical History:  Diagnosis Date  . Achilles tendon rupture right  . Arthritis    back and shoulders  . Chest pain    due to cocaine use  . Diabetes mellitus   . Diabetes mellitus without complication (HCC)   . Drug abuse (HCC)    cocaine, marijuana abuse  . GERD (gastroesophageal reflux disease)   . Hypertension   . Renal insufficiency     Patient Active Problem List   Diagnosis Date Noted  . Right ankle pain 05/15/2014  . Need for Tdap vaccination 05/15/2014  . Unspecified essential hypertension 05/14/2014  . Diabetes mellitus (HCC) 05/14/2014  . Rupture of right Achilles tendon 05/02/2014  . Tendon rupture, Achilles 05/02/2014    Past Surgical History:  Procedure Laterality Date  . ACHILLES TENDON SURGERY Right 05/02/2014   Procedure: RIGHT ACHILLES TENDON REPAIR;  Surgeon: Javier DockerJeffrey C Beane, MD;  Location: [facility];  Service: Orthopedics;  Laterality: Right;  . CARDIAC CATHETERIZATION  05.31.2012   showing patent coronaries with no visualized vasospasm and EF of 55-60 %  . ORBITAL FRACTURE  SURGERY Left 2-3 yrs ago        Home Medications    Prior to Admission medications   Medication Sig Start Date End Date Taking? Authorizing Provider  acetaminophen (TYLENOL) 325 MG tablet Take 2 tablets (650 mg total) by mouth every 6 (six) hours as needed. 02/14/18   Rolen Conger, Tinnie GensJeffrey, PA-C  amLODipine (NORVASC) 10 MG tablet Take 10 mg by mouth every morning.     [provider]  carvedilol (COREG) 25 MG tablet Take 25 mg by mouth 2 (two) times daily.     [provider]  docusate sodium (COLACE) 100 MG capsule Take 1 capsule (100 mg total) by mouth 2 (two) times daily. Patient not taking: Reported on 10/19/2016 05/02/14   Dorothy SparkBissell, Jaclyn M, PA-C  famotidine (PEPCID) 20 MG tablet Take 1 tablet (20 mg total) by mouth 2 (two) times daily for 5 days. 05/01/19 05/06/19  Joy, Shawn C, PA-C  furosemide (LASIX) 20 MG tablet Take 1 tablet (20 mg total) by mouth daily. Patient not taking: Reported on 02/26/2018 05/15/17   Jacalyn LefevreHaviland, Julie, MD  HYDROcodone-acetaminophen (NORCO/VICODIN) 5-325 MG tablet Take 1 tablet by mouth every 6 (six) hours as needed for moderate pain or severe pain. Patient not taking: Reported on 02/26/2018 12/21/17   Mardella LaymanHagler, Brian, MD  insulin NPH Human (HUMULIN N,NOVOLIN N) 100 UNIT/ML injection Inject 0.2 mLs (20 Units total) into the skin 2 (  two) times daily. Patient not taking: Reported on 02/19/2019 12/21/17   Mardella LaymanHagler, Brian, MD  insulin NPH-regular Human (NOVOLIN 70/30) (70-30) 100 UNIT/ML injection Take a directed. Patient taking differently: Inject 20 Units into the skin 2 (two) times daily with a meal. Take a directed. 12/21/17   Mardella LaymanHagler, Brian, MD  lisinopril-hydrochlorothiazide (PRINZIDE,ZESTORETIC) 20-25 MG per tablet Take 1 tablet by mouth every morning.     [provider]  methocarbamol (ROBAXIN) 500 MG tablet Take 1 tablet (500 mg total) by mouth 2 (two) times daily as needed. Patient not taking: Reported on 02/19/2019 02/23/18   Ward, Chase PicketJaime Pilcher, PA-C   pantoprazole (PROTONIX) 20 MG tablet Take 1 tablet (20 mg total) by mouth 2 (two) times daily. 02/19/19   Raeford RazorKohut, Stephen, MD  potassium chloride 20 MEQ TBCR Take 20 mEq by mouth daily. 05/28/19   Remon Quinto, Tinnie GensJeffrey, PA-C  traMADol (ULTRAM) 50 MG tablet Take 1 tablet (50 mg total) by mouth every 8 (eight) hours as needed. Patient not taking: Reported on 02/26/2018 11/22/17   Ward, Chase PicketJaime Pilcher, PA-C    Family History No family history on file.  Social History Social History   Tobacco Use  . Smoking status: Never Smoker  . Smokeless tobacco: Never Used  Substance Use Topics  . Alcohol use: Yes    Comment: occassional  . Drug use: Yes    Types: Cocaine, Marijuana    Comment: last cocaine use 2 to 3 yrs ago, last marijuana use 4 to 5 years ago     Allergies   Patient has no known allergies.   Review of Systems Review of Systems  All other systems reviewed and are negative.  Physical Exam Updated Vital Signs BP (!) 156/96   Pulse 85   Temp 98 F (36.7 C) (Oral)   Resp 16   Ht 5\' 9"  (1.753 m)   Wt 104.3 kg   SpO2 96%   BMI 33.96 kg/m   Physical Exam Vitals signs and nursing note reviewed.  Constitutional:      Appearance: He is well-developed.  HENT:     Head: Normocephalic and atraumatic.  Eyes:     General: No scleral icterus.       Right eye: No discharge.        Left eye: No discharge.     Conjunctiva/sclera: Conjunctivae normal.     Pupils: Pupils are equal, round, and reactive to light.  Neck:     Musculoskeletal: Normal range of motion.     Vascular: No JVD.     Trachea: No tracheal deviation.  Pulmonary:     Effort: Pulmonary effort is normal.     Breath sounds: No stridor.  Neurological:     Mental Status: He is alert and oriented to person, place, and time.     Coordination: Coordination normal.  Psychiatric:        Behavior: Behavior normal.        Thought Content: Thought content normal.        Judgment: Judgment normal.      ED Treatments /  Results  Labs (all labs ordered are listed, but only abnormal results are displayed) Labs Reviewed  BASIC METABOLIC PANEL - Abnormal; Notable for the following components:      Result Value   Sodium 133 (*)    Potassium 3.0 (*)    CO2 18 (*)    Glucose, Bld 156 (*)    Creatinine, Ser 2.10 (*)    Calcium 8.5 (*)  GFR calc non Af Amer 34 (*)    GFR calc Af Amer 40 (*)    All other components within normal limits  CBC WITH DIFFERENTIAL/PLATELET - Abnormal; Notable for the following components:   WBC 3.1 (*)    Platelets 145 (*)    Neutro Abs 1.6 (*)    All other components within normal limits  CBG MONITORING, ED - Abnormal; Notable for the following components:   Glucose-Capillary 157 (*)    All other components within normal limits    EKG None  Radiology No results found.  Procedures Procedures (including critical care time)  Medications Ordered in ED Medications  potassium chloride SA (K-DUR) CR tablet 40 mEq (40 mEq Oral Given 05/28/19 1609)     Initial Impression / Assessment and Plan / ED Course  I have reviewed the triage vital signs and the nursing notes.  Pertinent labs & imaging results that were available during my care of the patient were reviewed by me and considered in my medical decision making (see chart for details).        58 year old male presents today with complaints of generalized fatigue.  He has no focal findings on my exam.  Will check BMP given diabetic history.   Final Clinical Impressions(s) / ED Diagnoses   Final diagnoses:  Weakness  Hypokalemia  Elevated serum creatinine    ED Discharge Orders         Ordered    potassium chloride 20 MEQ TBCR  Daily     05/28/19 1728           Francee Gentile 05/28/19 1730    Carmin Muskrat, MD 05/29/19 6759    Carmin Muskrat, MD 05/29/19 [phone]

## 2019-05-28 NOTE — ED Triage Notes (Signed)
Pt in with generalized weakness x 2 days, no neuro symptoms noted. Denies any fevers, cough or sob

## 2019-05-28 NOTE — ED Notes (Signed)
Patient Alert and oriented to baseline. Stable and ambulatory to baseline. Patient verbalized understanding of the discharge instructions.  Patient belongings were taken by the patient.   

## 2019-05-28 NOTE — Discharge Instructions (Addendum)
Please read the attached information.  As we discussed your potassium is low creatinine is elevated.  You have had elevated creatinine in the past.  This is a function of your kidneys.  It is important to follow-up with your primary care provider this week for ongoing evaluation and management of your kidneys and potassium.  Please use medications as directed return immediately if develop any new or worsening signs or symptoms.

## 2019-05-29 ENCOUNTER — Encounter (HOSPITAL_COMMUNITY): Payer: Self-pay | Admitting: Emergency Medicine

## 2019-05-29 ENCOUNTER — Emergency Department (HOSPITAL_COMMUNITY)
Admission: EM | Admit: 2019-05-29 | Discharge: 2019-05-29 | Disposition: A | Discharge status: Home / Self Care | Payer: Self-pay | Attending: Emergency Medicine | Admitting: Emergency Medicine

## 2019-05-29 ENCOUNTER — Other Ambulatory Visit: Payer: Self-pay

## 2019-05-29 ENCOUNTER — Emergency Department (HOSPITAL_COMMUNITY): Payer: Self-pay

## 2019-05-29 DIAGNOSIS — Z794 Long term (current) use of insulin: Secondary | ICD-10-CM | POA: Insufficient documentation

## 2019-05-29 DIAGNOSIS — N289 Disorder of kidney and ureter, unspecified: Secondary | ICD-10-CM | POA: Insufficient documentation

## 2019-05-29 DIAGNOSIS — R531 Weakness: Secondary | ICD-10-CM | POA: Insufficient documentation

## 2019-05-29 DIAGNOSIS — E1165 Type 2 diabetes mellitus with hyperglycemia: Secondary | ICD-10-CM | POA: Insufficient documentation

## 2019-05-29 DIAGNOSIS — R110 Nausea: Secondary | ICD-10-CM | POA: Insufficient documentation

## 2019-05-29 DIAGNOSIS — R519 Headache, unspecified: Secondary | ICD-10-CM

## 2019-05-29 DIAGNOSIS — Z79899 Other long term (current) drug therapy: Secondary | ICD-10-CM | POA: Insufficient documentation

## 2019-05-29 DIAGNOSIS — I10 Essential (primary) hypertension: Secondary | ICD-10-CM | POA: Insufficient documentation

## 2019-05-29 DIAGNOSIS — R739 Hyperglycemia, unspecified: Secondary | ICD-10-CM

## 2019-05-29 DIAGNOSIS — R51 Headache: Secondary | ICD-10-CM | POA: Insufficient documentation

## 2019-05-29 LAB — COMPREHENSIVE METABOLIC PANEL
ALT: 21 U/L (ref 0–44)
AST: 26 U/L (ref 15–41)
Albumin: 3.4 g/dL — ABNORMAL LOW (ref 3.5–5.0)
Alkaline Phosphatase: 90 U/L (ref 38–126)
Anion gap: 12 (ref 5–15)
BUN: 13 mg/dL (ref 6–20)
CO2: 20 mmol/L — ABNORMAL LOW (ref 22–32)
Calcium: 8.6 mg/dL — ABNORMAL LOW (ref 8.9–10.3)
Chloride: 101 mmol/L (ref 98–111)
Creatinine, Ser: 1.68 mg/dL — ABNORMAL HIGH (ref 0.61–1.24)
GFR calc Af Amer: 52 mL/min — ABNORMAL LOW (ref >60)
GFR calc non Af Amer: 45 mL/min — ABNORMAL LOW (ref >60)
Glucose, Bld: 202 mg/dL — ABNORMAL HIGH (ref 70–99)
Potassium: 3.4 mmol/L — ABNORMAL LOW (ref 3.5–5.1)
Sodium: 133 mmol/L — ABNORMAL LOW (ref 135–145)
Total Bilirubin: 0.5 mg/dL (ref 0.3–1.2)
Total Protein: 7.2 g/dL (ref 6.5–8.1)

## 2019-05-29 LAB — CBC WITH DIFFERENTIAL/PLATELET
Abs Immature Granulocytes: 0 10*3/uL (ref 0.00–0.07)
Basophils Absolute: 0 10*3/uL (ref 0.0–0.1)
Basophils Relative: 0 %
Eosinophils Absolute: 0 10*3/uL (ref 0.0–0.5)
Eosinophils Relative: 0 %
HCT: 44.8 % (ref 39.0–52.0)
Hemoglobin: 15.1 g/dL (ref 13.0–17.0)
Immature Granulocytes: 0 %
Lymphocytes Relative: 24 %
Lymphs Abs: 0.8 10*3/uL (ref 0.7–4.0)
MCH: 29.5 pg (ref 26.0–34.0)
MCHC: 33.7 g/dL (ref 30.0–36.0)
MCV: 87.5 fL (ref 80.0–100.0)
Monocytes Absolute: 0.3 10*3/uL (ref 0.1–1.0)
Monocytes Relative: 10 %
Neutro Abs: 2.2 10*3/uL (ref 1.7–7.7)
Neutrophils Relative %: 66 %
Platelets: 138 10*3/uL — ABNORMAL LOW (ref 150–400)
RBC: 5.12 MIL/uL (ref 4.22–5.81)
RDW: 14.6 % (ref 11.5–15.5)
WBC: 3.3 10*3/uL — ABNORMAL LOW (ref 4.0–10.5)
nRBC: 0 % (ref 0.0–0.2)

## 2019-05-29 MED ORDER — PROCHLORPERAZINE EDISYLATE 10 MG/2ML IJ SOLN
5.0000 mg | Freq: Once | INTRAMUSCULAR | Status: AC
  Administered 2019-05-29: 5 mg via INTRAVENOUS
  Filled 2019-05-29: qty 2

## 2019-05-29 MED ORDER — SODIUM CHLORIDE 0.9 % IV BOLUS
1000.0000 mL | Freq: Once | INTRAVENOUS | Status: AC
  Administered 2019-05-29: 1000 mL via INTRAVENOUS

## 2019-05-29 NOTE — Discharge Instructions (Signed)
As discussed, your evaluation today has been largely reassuring.  But, it is important that you monitor your condition carefully, and do not hesitate to return to the ED if you develop new, or concerning changes in your condition.  For the next week, please increase your insulin dosing up to 22 units twice daily.  Otherwise, please follow-up with your physician for appropriate ongoing care.

## 2019-05-29 NOTE — ED Triage Notes (Signed)
Pt arrives from home complaining of generalized weakness and headache x3 days. No neuro symptoms noted. Pt states he was here yesterday but his weakness has gotten worse today. Pt states he woke up this morning and went to work but was unable to do much.

## 2019-05-29 NOTE — ED Provider Notes (Signed)
[facility] EM[facility]GENCY [facility] Provider Note   CSN: [id number] Arrival date & time: 05/29/19  1020    History   Chief Complaint Chief Complaint  Patient presents with  . Headache  . Weakness    HPI Ryan Cobbsnthony Holten is a 58 y.o. male.     HPI Patient presents 1 day after being evaluated here with concern of weakness, now with concern of ongoing weakness, as well as headache. Patient acknowledges multiple medical issues, including diabetes, hypertension. He also has ongoing issues with abdominal pain, and is scheduled to see a gastroenterologist later this month. He notes that since yesterday after being evaluated he has had persistent weakness and has developed a mild diffuse headache. No new medication taken for relief of his pain. He takes his other medication as directed, reportedly. No fever, no cough, no dyspnea, abdominal pain, no vomiting, no diarrhea, though there is mild nausea.  Past Medical History:  Diagnosis Date  . Achilles tendon rupture right  . Arthritis    back and shoulders  . Chest pain    due to cocaine use  . Diabetes mellitus   . Diabetes mellitus without complication (HCC)   . Drug abuse (HCC)    cocaine, marijuana abuse  . GERD (gastroesophageal reflux disease)   . Hypertension   . Renal insufficiency     Patient Active Problem List   Diagnosis Date Noted  . Right ankle pain 05/15/2014  . Need for Tdap vaccination 05/15/2014  . Unspecified essential hypertension 05/14/2014  . Diabetes mellitus (HCC) 05/14/2014  . Rupture of right Achilles tendon 05/02/2014  . Tendon rupture, Achilles 05/02/2014    Past Surgical History:  Procedure Laterality Date  . ACHILLES TENDON SURGERY Right 05/02/2014   Procedure: RIGHT ACHILLES TENDON REPAIR;  Surgeon: Javier DockerJeffrey C Beane, MD;  Location: [facility];  Service: Orthopedics;  Laterality: Right;  . CARDIAC CATHETERIZATION  05.31.2012   showing patent coronaries with no visualized  vasospasm and EF of 55-60 %  . ORBITAL FRACTURE SURGERY Left 2-3 yrs ago        Home Medications    Prior to Admission medications   Medication Sig Start Date End Date Taking? Authorizing Provider  amLODipine (NORVASC) 10 MG tablet Take 10 mg by mouth every morning.    Yes [provider]  carvedilol (COREG) 25 MG tablet Take 25 mg by mouth 2 (two) times daily.    Yes [provider]  insulin NPH-regular Human (NOVOLIN 70/30) (70-30) 100 UNIT/ML injection Take a directed. Patient taking differently: Inject 20 Units into the skin 2 (two) times daily with a meal. Take a directed. 12/21/17  Yes Hagler, Arlys JohnBrian, MD  lisinopril-hydrochlorothiazide (PRINZIDE,ZESTORETIC) 20-25 MG per tablet Take 1 tablet by mouth every morning.    Yes [provider]  pantoprazole (PROTONIX) 20 MG tablet Take 1 tablet (20 mg total) by mouth 2 (two) times daily. 02/19/19  Yes Raeford RazorKohut, Stephen, MD  famotidine (PEPCID) 20 MG tablet Take 1 tablet (20 mg total) by mouth 2 (two) times daily for 5 days. Patient not taking: Reported on 05/29/2019 05/01/19 05/29/19  Joy, Ines BloomerShawn C, PA-C  furosemide (LASIX) 20 MG tablet Take 1 tablet (20 mg total) by mouth daily. Patient not taking: Reported on 02/26/2018 05/15/17   Jacalyn LefevreHaviland, Julie, MD  potassium chloride 20 MEQ TBCR Take 20 mEq by mouth daily. Patient not taking: Reported on 05/29/2019 05/28/19   Eyvonne MechanicHedges, Jeffrey, PA-C    Family History No family history on file.  Social  History Social History   Tobacco Use  . Smoking status: Never Smoker  . Smokeless tobacco: Never Used  Substance Use Topics  . Alcohol use: Yes    Comment: occassional  . Drug use: Yes    Types: Cocaine, Marijuana    Comment: last cocaine use 2 to 3 yrs ago, last marijuana use 4 to 5 years ago     Allergies   Patient has no known allergies.   Review of Systems Review of Systems  Constitutional:       Per HPI, otherwise negative  HENT:       Per HPI, otherwise negative   Respiratory:       Per HPI, otherwise negative  Cardiovascular:       Per HPI, otherwise negative  Gastrointestinal: Positive for nausea. Negative for abdominal pain and vomiting.  Endocrine:       Negative aside from HPI  Genitourinary:       Neg aside from HPI   Musculoskeletal:       Per HPI, otherwise negative  Skin: Negative.   Neurological: Positive for weakness and headaches. Negative for syncope and numbness.     Physical Exam Updated Vital Signs BP (!) 152/94   Pulse 77   Temp 99.3 F (37.4 C) (Oral)   Resp (!) 28   Ht 5\' 9"  (1.753 m)   Wt 108.9 kg   SpO2 93%   BMI 35.44 kg/m   Physical Exam Vitals signs and nursing note reviewed.  Constitutional:      General: He is not in acute distress.    Appearance: He is well-developed.  HENT:     Head: Normocephalic and atraumatic.  Eyes:     Conjunctiva/sclera: Conjunctivae normal.     Comments: amblyopia  Cardiovascular:     Rate and Rhythm: Normal rate and regular rhythm.  Pulmonary:     Effort: Pulmonary effort is normal. No respiratory distress.     Breath sounds: No stridor.  Abdominal:     General: There is no distension.  Skin:    General: Skin is warm and dry.  Neurological:     Mental Status: He is alert and oriented to person, place, and time.  Psychiatric:        Mood and Affect: Mood normal.      ED Treatments / Results  Labs (all labs ordered are listed, but only abnormal results are displayed) Labs Reviewed  COMPREHENSIVE METABOLIC PANEL - Abnormal; Notable for the following components:      Result Value   Sodium 133 (*)    Potassium 3.4 (*)    CO2 20 (*)    Glucose, Bld 202 (*)    Creatinine, Ser 1.68 (*)    Calcium 8.6 (*)    Albumin 3.4 (*)    GFR calc non Af Amer 45 (*)    GFR calc Af Amer 52 (*)    All other components within normal limits  CBC WITH DIFFERENTIAL/PLATELET - Abnormal; Notable for the following components:   WBC 3.3 (*)    Platelets 138 (*)    All other  components within normal limits  URINALYSIS, ROUTINE W REFLEX MICROSCOPIC    EKG None  Radiology Dg Chest Port 1 View  Result Date: 05/29/2019 CLINICAL DATA:  Cough and weakness.  History of lung nodule EXAM: PORTABLE CHEST 1 VIEW COMPARISON:  Chest two-view 02/19/2019 FINDINGS: Heart size mildly enlarged. Negative for heart failure. Decreased lung volume with mild atelectasis in the bases. Negative  for consolidation or effusion. 5 mm nodule along the minor fissure on the right is noted on prior CT of 02/26/2018 and not visualized on the current study. IMPRESSION: Hypoventilation with mild bibasilar atelectasis. Electronically Signed   By: Franchot Gallo M.D.   On: 05/29/2019 11:32    Procedures Procedures (including critical care time)  Medications Ordered in ED Medications  sodium chloride 0.9 % bolus 1,000 mL (1,000 mLs Intravenous New Bag/Given 05/29/19 1102)  prochlorperazine (COMPAZINE) injection 5 mg (5 mg Intravenous Given 05/29/19 1103)     Initial Impression / Assessment and Plan / ED Course  I have reviewed the triage vital signs and the nursing notes.  Pertinent labs & imaging results that were available during my care of the patient were reviewed by me and considered in my medical decision making (see chart for details).    After the initial evaluation I reviewed the patient's chart including documentation from yesterday, and last year. Notably, last year the patient was found to have pulmonary nodule, with recommendation for one-year follow-up if he is high risk. Patient confirms that he is not a smoker, has no current cough, nor shortness of breath.     1:06 PM Patient calm, in no distress, watching television. I reviewed the labs with him, and in comparison to yesterday's and prior studies, glucose value is slightly elevated, creatinine is down from yesterday, labs are otherwise generally reassuring. With no cough, no fever, unremarkable x-ray, low suspicion for  occult pneumonia, COVID. With improvement here with fluids, Compazine, some suspicion for migraine. We lengthy conversation about patient's hyperglycemia, his access to appropriate medication for control, and need to follow-up with primary care in 1 week after we change his insulin dosing slightly. With otherwise reassuring findings, no notable changes, and need improvement since yesterday, but with new headache, that is unremarkable, now resolved, no neurologic deficits, patient is appropriate for discharge with close outpatient follow-up.   Ryan [name] was evaluated in Emergency Department on 05/29/2019 for the symptoms described in the history of present illness. He was evaluated in the context of the global COVID-19 pandemic, which necessitated consideration that the patient might be at risk for infection with the SARS-CoV-2 virus that causes COVID-19. Institutional protocols and algorithms that pertain to the evaluation of patients at risk for COVID-19 are in a state of rapid change based on information released by regulatory bodies including the CDC and federal and state organizations. These policies and algorithms were followed during the patient's care in the ED.  Final Clinical Impressions(s) / ED Diagnoses   Final diagnoses:  Bad headache  Weakness  Hyperglycemia     Carmin Muskrat, MD 05/29/19 1308

## 2019-05-31 ENCOUNTER — Emergency Department (HOSPITAL_COMMUNITY): Payer: HRSA Program

## 2019-05-31 ENCOUNTER — Encounter (HOSPITAL_COMMUNITY): Payer: Self-pay | Admitting: Emergency Medicine

## 2019-05-31 ENCOUNTER — Observation Stay (HOSPITAL_COMMUNITY)
Admission: EM | Admit: 2019-05-31 | Discharge: 2019-06-01 | Disposition: A | Discharge status: Home / Self Care | Payer: HRSA Program | Attending: Internal Medicine | Admitting: Internal Medicine

## 2019-05-31 ENCOUNTER — Other Ambulatory Visit: Payer: Self-pay

## 2019-05-31 DIAGNOSIS — I10 Essential (primary) hypertension: Secondary | ICD-10-CM | POA: Diagnosis not present

## 2019-05-31 DIAGNOSIS — K219 Gastro-esophageal reflux disease without esophagitis: Secondary | ICD-10-CM | POA: Diagnosis not present

## 2019-05-31 DIAGNOSIS — J988 Other specified respiratory disorders: Secondary | ICD-10-CM | POA: Diagnosis present

## 2019-05-31 DIAGNOSIS — Z7151 Drug abuse counseling and surveillance of drug abuser: Secondary | ICD-10-CM | POA: Diagnosis not present

## 2019-05-31 DIAGNOSIS — E1122 Type 2 diabetes mellitus with diabetic chronic kidney disease: Secondary | ICD-10-CM | POA: Diagnosis not present

## 2019-05-31 DIAGNOSIS — Z794 Long term (current) use of insulin: Secondary | ICD-10-CM

## 2019-05-31 DIAGNOSIS — E119 Type 2 diabetes mellitus without complications: Secondary | ICD-10-CM | POA: Diagnosis not present

## 2019-05-31 DIAGNOSIS — F1410 Cocaine abuse, uncomplicated: Secondary | ICD-10-CM | POA: Diagnosis not present

## 2019-05-31 DIAGNOSIS — N183 Chronic kidney disease, stage 3 unspecified: Secondary | ICD-10-CM | POA: Diagnosis present

## 2019-05-31 DIAGNOSIS — F1910 Other psychoactive substance abuse, uncomplicated: Secondary | ICD-10-CM | POA: Diagnosis present

## 2019-05-31 DIAGNOSIS — R55 Syncope and collapse: Secondary | ICD-10-CM

## 2019-05-31 DIAGNOSIS — I951 Orthostatic hypotension: Secondary | ICD-10-CM | POA: Diagnosis present

## 2019-05-31 DIAGNOSIS — Z79899 Other long term (current) drug therapy: Secondary | ICD-10-CM | POA: Insufficient documentation

## 2019-05-31 DIAGNOSIS — I129 Hypertensive chronic kidney disease with stage 1 through stage 4 chronic kidney disease, or unspecified chronic kidney disease: Secondary | ICD-10-CM | POA: Insufficient documentation

## 2019-05-31 DIAGNOSIS — U071 COVID-19: Secondary | ICD-10-CM | POA: Diagnosis present

## 2019-05-31 HISTORY — DX: COVID-19: U07.1

## 2019-05-31 LAB — BASIC METABOLIC PANEL
Anion gap: 11 (ref 5–15)
BUN: 21 mg/dL — ABNORMAL HIGH (ref 6–20)
CO2: 23 mmol/L (ref 22–32)
Calcium: 8.8 mg/dL — ABNORMAL LOW (ref 8.9–10.3)
Chloride: 101 mmol/L (ref 98–111)
Creatinine, Ser: 1.71 mg/dL — ABNORMAL HIGH (ref 0.61–1.24)
GFR calc Af Amer: 51 mL/min — ABNORMAL LOW (ref >60)
GFR calc non Af Amer: 44 mL/min — ABNORMAL LOW (ref >60)
Glucose, Bld: 189 mg/dL — ABNORMAL HIGH (ref 70–99)
Potassium: 3.7 mmol/L (ref 3.5–5.1)
Sodium: 135 mmol/L (ref 135–145)

## 2019-05-31 LAB — URINALYSIS, ROUTINE W REFLEX MICROSCOPIC
Bilirubin Urine: NEGATIVE
Glucose, UA: NEGATIVE mg/dL
Hgb urine dipstick: NEGATIVE
Ketones, ur: NEGATIVE mg/dL
Leukocytes,Ua: NEGATIVE
Nitrite: NEGATIVE
Protein, ur: 30 mg/dL — AB
Specific Gravity, Urine: 1.017 (ref 1.005–1.030)
pH: 6 (ref 5.0–8.0)

## 2019-05-31 LAB — CBC
HCT: 46.1 % (ref 39.0–52.0)
Hemoglobin: 15.5 g/dL (ref 13.0–17.0)
MCH: 30.2 pg (ref 26.0–34.0)
MCHC: 33.6 g/dL (ref 30.0–36.0)
MCV: 89.7 fL (ref 80.0–100.0)
Platelets: 161 10*3/uL (ref 150–400)
RBC: 5.14 MIL/uL (ref 4.22–5.81)
RDW: 14.6 % (ref 11.5–15.5)
WBC: 3.9 10*3/uL — ABNORMAL LOW (ref 4.0–10.5)
nRBC: 0 % (ref 0.0–0.2)

## 2019-05-31 LAB — D-DIMER, QUANTITATIVE: D-Dimer, Quant: 1.26 ug/mL-FEU — ABNORMAL HIGH (ref 0.00–0.50)

## 2019-05-31 LAB — SARS CORONAVIRUS 2 BY RT PCR (HOSPITAL ORDER, PERFORMED IN ~~LOC~~ HOSPITAL LAB): SARS Coronavirus 2: POSITIVE — AB

## 2019-05-31 LAB — CBG MONITORING, ED: Glucose-Capillary: 192 mg/dL — ABNORMAL HIGH (ref 70–99)

## 2019-05-31 MED ORDER — IOHEXOL 350 MG/ML SOLN
100.0000 mL | Freq: Once | INTRAVENOUS | Status: AC | PRN
  Administered 2019-05-31: 100 mL via INTRAVENOUS

## 2019-05-31 MED ORDER — SODIUM CHLORIDE 0.9 % IV BOLUS
1000.0000 mL | Freq: Once | INTRAVENOUS | Status: AC
  Administered 2019-05-31: 18:00:00 1000 mL via INTRAVENOUS

## 2019-05-31 MED ORDER — SODIUM CHLORIDE (PF) 0.9 % IJ SOLN
INTRAMUSCULAR | Status: AC
  Filled 2019-05-31: qty 50

## 2019-05-31 MED ORDER — SODIUM CHLORIDE 0.9 % IV BOLUS
1000.0000 mL | Freq: Once | INTRAVENOUS | Status: AC
  Administered 2019-05-31: 1000 mL via INTRAVENOUS

## 2019-05-31 NOTE — TOC Initial Note (Addendum)
Transition of Care [facility]) - Initial/Assessment Note    Patient Details  Name: Ryan [name] MRN: [id number] Date of Birth: December 24, 1960  [facility] CM/SW Contact:    Erenest Rasher, RN Phone Number: [phone] 05/31/2019, 2:33 PM  Clinical Narrative:     5 ED visits in past 6 months/no insurance            Spoke to pt and states he does follow up with NP, Marliss Coots. Takes his meds as prescribed. Works at [employer]. States he has follow up appt on 06/21/2019.   Expected Discharge Plan: Home/Self Care     Patient Goals and CMS Choice        Expected Discharge Plan and Services Expected Discharge Plan: Home/Self Care   Discharge Planning Services: CM Consult                                          Prior Living Arrangements/Services     Patient language and need for interpreter reviewed:: Yes        Need for Family Participation in Patient Care: No (Comment) Care giver support system in place?: No (comment)   Criminal Activity/Legal Involvement Pertinent to Current Situation/Hospitalization: No - Comment as needed  Activities of Daily Living      Permission Sought/Granted Permission sought to share information with : Case Manager, PCP Permission granted to share information with : Yes, Verbal Permission Granted              Emotional Assessment   Attitude/Demeanor/Rapport: Engaged Affect (typically observed): Accepting Orientation: : Oriented to Self, Oriented to Place, Oriented to  Time, Oriented to Situation   Psych Involvement: No (comment)  Admission diagnosis:  weakness  Patient Active Problem List   Diagnosis Date Noted  . Right ankle pain 05/15/2014  . Need for Tdap vaccination 05/15/2014  . Unspecified essential hypertension 05/14/2014  . Diabetes mellitus ([place]) 05/14/2014  . Rupture of right Achilles tendon 05/02/2014  . Tendon rupture, Achilles 05/02/2014   PCP:  Marliss Coots, NP Pharmacy:    [facility] [place], [place] [address] Phone: [phone] Fax: [phone]     Social Determinants of Health (SDOH) Interventions    Readmission Risk Interventions No flowsheet data found.

## 2019-05-31 NOTE — ED Notes (Signed)
ED TO INPATIENT HANDOFF REPORT  ED Nurse Name and Phone #: Candyce ChurnZachary Amaree Loisel  S Name/Age/Gender Ryan CobbsAnthony [name] 58 y.o. male Room/Bed: WA17/WA17  Code Status   Code Status: Prior  [facility]SNF/Other [facility] Patient oriented to: self, place, time and situation Is this baseline? 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-states no pain   Allergies No Known Allergies  Level of Care/Admitting Diagnosis ED Disposition    ED Disposition Condition Comment   Admit  Hospital Area: [facility] [[id number]]  Level of Care: Telemetry [5]  Covid Evaluation: Confirmed COVID Positive  Isolation Risk Level: Low Risk/Droplet (Less than 4L  supplementation)  Diagnosis: COVID-19 virus infection [[id number]][[phone]]  Admitting Physician: Briscoe DeutscherPYD, TIMOTHY S [[id number]]  Attending Physician: Briscoe DeutscherPYD, TIMOTHY S [[id number]]  PT Class (Do Not Modify): Observation [104]  PT Acc Code (Do Not Modify): Observation [10022]       B Medical/Surgery History Past Medical History:  Diagnosis Date  . Achilles tendon rupture right  . Arthritis    back and shoulders  . Chest pain    due to cocaine use  . Diabetes mellitus   . Diabetes mellitus without complication (HCC)   . Drug abuse (HCC)    cocaine, marijuana abuse  . GERD (gastroesophageal reflux disease)   . Hypertension   . Renal insufficiency    Past Surgical History:  Procedure Laterality Date  . ACHILLES TENDON SURGERY Right 05/02/2014   Procedure: RIGHT ACHILLES TENDON REPAIR;  Surgeon: Javier DockerJeffrey C Beane, MD;  Location: [facility];  Service: Orthopedics;  Laterality: Right;  . CARDIAC CATHETERIZATION  05.31.2012   showing patent coronaries with no visualized vasospasm and EF of 55-60 %  . ORBITAL FRACTURE SURGERY Left 2-3 yrs ago     A IV  Location/Drains/Wounds Patient Lines/Drains/Airways Status   Active Line/Drains/Airways    Name:   Placement date:   Placement time:   Site:   Days:   Peripheral IV 05/31/19 Right Forearm   05/31/19    1136    Forearm   less than 1   Incision (Closed) 05/02/14 Ankle Right   05/02/14    0835     1855          Intake/Output Last 24 hours  Intake/Output Summary (Last 24 hours) at 05/31/2019 2024 Last data filed at 05/31/2019 2023 Gross per 24 hour  Intake 1000 ml  Output -  Net 1000 ml    Labs/Imaging Results for orders placed or performed during the hospital encounter of 05/31/19 (from the past 48 hour(s))  CBG monitoring, ED     Status: Abnormal   Collection Time: 05/31/19 11:06 AM  Result Value Ref Range   Glucose-Capillary 192 (H) 70 - 99 mg/dL  Basic metabolic panel     Status: Abnormal   Collection Time: 05/31/19 11:29 AM  Result Value Ref Range   Sodium 135 135 - 145 mmol/L   Potassium 3.7 3.5 - 5.1 mmol/L   Chloride 101 98 - 111 mmol/L   CO2 23 22 - 32 mmol/L   Glucose, Bld 189 (H) 70 - 99 mg/dL   BUN 21 (H) 6 - 20 mg/dL   Creatinine, Ser 2.131.71 (H) 0.61 - 1.24 mg/dL   Calcium 8.8 (L) 8.9 - 10.3 mg/dL   GFR calc  non Af Amer 44 (L) >60 mL/min   GFR calc Af Amer 51 (L) >60 mL/min   Anion gap 11 5 - 15    Comment: Performed at [facility], [address] [place], [place] [address]  CBC     Status: Abnormal   Collection Time: 05/31/19 11:29 AM  Result Value Ref Range   WBC 3.9 (L) 4.0 - 10.5 K/uL   RBC 5.14 4.22 - 5.81 MIL/uL   Hemoglobin 15.5 13.0 - 17.0 g/dL   HCT 29.5 62.1 - 30.8 %   MCV 89.7 80.0 - 100.0 fL   MCH 30.2 26.0 - 34.0 pg   MCHC 33.6 30.0 - 36.0 g/dL   RDW 65.7 84.6 - 96.2 %   Platelets 161 150 - 400 K/uL   nRBC 0.0 0.0 - 0.2 %    Comment: Performed at [facility], [address] [place], [place] [address]  D-dimer, quantitative (not at [facility])     Status: Abnormal   Collection Time: 05/31/19 12:16 PM   Result Value Ref Range   D-Dimer, Quant 1.26 (H) 0.00 - 0.50 ug/mL-FEU    Comment: (NOTE) At the manufacturer cut-off of 0.50 ug/mL FEU, this assay has been documented to exclude PE with a sensitivity and negative predictive value of 97 to 99%.  At this time, this assay has not been approved by the FDA to exclude DVT/VTE. Results should be correlated with clinical presentation. Performed at [facility], [address], [place], [place] [address]   Urinalysis, Routine w reflex microscopic     Status: Abnormal   Collection Time: 05/31/19  4:23 PM  Result Value Ref Range   Color, Urine YELLOW YELLOW   APPearance CLEAR CLEAR   Specific Gravity, Urine 1.017 1.005 - 1.030   pH 6.0 5.0 - 8.0   Glucose, UA NEGATIVE NEGATIVE mg/dL   Hgb urine dipstick NEGATIVE NEGATIVE   Bilirubin Urine NEGATIVE NEGATIVE   Ketones, ur NEGATIVE NEGATIVE mg/dL   Protein, ur 30 (A) NEGATIVE mg/dL   Nitrite NEGATIVE NEGATIVE   Leukocytes,Ua NEGATIVE NEGATIVE   RBC / HPF 0-5 0 - 5 RBC/hpf   WBC, UA 0-5 0 - 5 WBC/hpf   Bacteria, UA RARE (A) NONE SEEN   Mucus PRESENT     Comment: Performed at [facility], [address], [place], [place] [address]  SARS Coronavirus 2 (CEPHEID- Performed in [facility] hospital lab), Hosp Order     Status: Abnormal   Collection Time: 05/31/19  5:12 PM   Specimen: Nasopharyngeal Swab  Result Value Ref Range   SARS Coronavirus 2 POSITIVE (A) NEGATIVE    Comment: RESULT CALLED TO, READ BACK BY AND VERIFIED WITH: S.BINGHAM AT 1845 ON 05/31/19 BY N.THOMPSON (NOTE) If result is NEGATIVE SARS-CoV-2 target nucleic acids are NOT DETECTED. The SARS-CoV-2 RNA is generally detectable in upper and lower  respiratory specimens during the acute phase of infection. The lowest  concentration of SARS-CoV-2 viral copies this assay can detect is 250  copies / mL. A negative result does not preclude SARS-CoV-2 infection  and should not be used as the  sole basis for treatment or other  patient management decisions.  A negative result may occur with  improper specimen collection / handling, submission of specimen other  than nasopharyngeal swab, presence of viral mutation(s) within the  areas targeted by this assay, and inadequate number of viral copies  (<250 copies / mL). A negative result must be  combined with clinical  observations, patient history, and epidemiological information. If result is POSITIVE SARS-CoV-2 target nucleic acids are DETEC TED. The SARS-CoV-2 RNA is generally detectable in upper and lower  respiratory specimens during the acute phase of infection.  Positive  results are indicative of active infection with SARS-CoV-2.  Clinical  correlation with patient history and other diagnostic information is  necessary to determine patient infection status.  Positive results do  not rule out bacterial infection or co-infection with other viruses. If result is PRESUMPTIVE POSTIVE SARS-CoV-2 nucleic acids MAY BE PRESENT.   A presumptive positive result was obtained on the submitted specimen  and confirmed on repeat testing.  While 2019 novel coronavirus  (SARS-CoV-2) nucleic acids may be present in the submitted sample  additional confirmatory testing may be necessary for epidemiological  and / or clinical management purposes  to differentiate between  SARS-CoV-2 and other Sarbecovirus currently known to infect humans.  If clinically indicated additional testing with an alternate test  methodology (LAB7 453) is advised. The SARS-CoV-2 RNA is generally  detectable in upper and lower respiratory specimens during the acute  phase of infection. The expected result is Negative. Fact Sheet for Patients:  BoilerBrush.com.cyhttps://www.fda.gov/media/136312/download Fact Sheet for Healthcare Providers: https://pope.com/https://www.fda.gov/media/136313/download This test is not yet approved or cleared by the [place] FDA and has been authorized for detection  and/or diagnosis of SARS-CoV-2 by FDA under an Emergency Use Authorization (EUA).  This EUA will remain in effect (meaning this test can be used) for the duration of the COVID-19 declaration under Section 564(b)(1) of the Act, 21 U.S.C. section 360bbb-3(b)(1), unless the authorization is terminated or revoked sooner. Performed at [facility], [address] [place], [place] [address]    Ct Head Wo Contrast  Result Date: 05/31/2019 CLINICAL DATA:  Tired and weak x3 days.  Head trauma. EXAM: CT HEAD WITHOUT CONTRAST TECHNIQUE: Contiguous axial images were obtained from the base of the skull through the vertex without intravenous contrast. COMPARISON:  No recent relevant CT available for comparison. FINDINGS: Brain: No evidence of acute infarction, hemorrhage, hydrocephalus, extra-axial collection or mass lesion/mass effect. Vascular: No hyperdense vessel or unexpected calcification. Skull: Normal. Negative for fracture or focal lesion. The patient is status post remote repair of the left frontal bone. There is an old defect involving the left lamina papyracea. Sinuses/Orbits: No acute finding. Other: None. IMPRESSION: No acute intracranial abnormality. Electronically Signed   By: Katherine Mantlehristopher  Green M.D.   On: 05/31/2019 13:26   Ct Angio Chest Pe W And/or Wo Contrast  Result Date: 05/31/2019 CLINICAL DATA:  PE suspected, syncope EXAM: CT ANGIOGRAPHY CHEST WITH CONTRAST TECHNIQUE: Multidetector CT imaging of the chest was performed using the standard protocol during bolus administration of intravenous contrast. Multiplanar CT image reconstructions and MIPs were obtained to evaluate the vascular anatomy. CONTRAST:  100mL OMNIPAQUE IOHEXOL 350 MG/ML SOLN COMPARISON:  02/26/2018 FINDINGS: Cardiovascular: Satisfactory opacification of the pulmonary arteries to the segmental level. No evidence of pulmonary embolism. Cardiomegaly. No pericardial effusion. Mediastinum/Nodes: No enlarged  mediastinal, hilar, or axillary lymph nodes. Hiatal hernia. Thyroid gland, trachea, and esophagus demonstrate no significant findings. Lungs/Pleura: There is extensive bilateral, predominantly subpleural ground-glass and heterogeneous pulmonary opacity. There are stable, benign small pulmonary nodules of the right upper and middle lobes (series 11, image 62, 59). No pleural effusion or pneumothorax. Upper Abdomen: No acute abnormality. Musculoskeletal: No chest wall abnormality. No acute or significant osseous findings. Review of the MIP images confirms the above findings. IMPRESSION:  1.  Negative examination for pulmonary embolism. 2. There is extensive bilateral, predominantly subpleural ground-glass and heterogeneous pulmonary opacity. There are a spectrum of findings in the lungs which can be seen with acute atypical infection (as well as other non-infectious etiologies). In particular, viral pneumonia (including COVID-19) should be considered in the appropriate clinical setting. Electronically Signed   By: Alex  Bibbey M.D.   On: 05/31/2019 14:47    Pending Labs [employer]resulted Labs (From admission, onward)    Start     Ordered   Signed and Held  HIV antLauralyn Primesibody (Routine Testing)  Tomorrow morning,   R     Signed and Held   Signed and Held  Creatinine, serum  (enoxaparin (LOVENOX)    CrCl >/= 30 ml/min)  Weekly,   R    Comments: while on enoxaparin therapy    Signed and Held   Signed and Held  HIV antibody (Routine Testing)  Tomorrow morning,   R     Signed and Held   Signed and Held  ABO/Rh  Once,   R     Signed and Held   Signed and Held  C-reactive protein  Once,   R     Signed and Medical illustratorHeld   Signed and Held  Hepatitis B surface antigen  Once,   R     Signed and Held   Signed and Held  Glucose 6 phosphate dehydrogenase  Once,   R     Signed and Held   Medical illustratorigned and [employer] ServicesHeld  Fibrinogen  Once,   R     Signed and Held   Signed and Held  Lactate dehydrogenase  Once,   R     Signed and Held   Signed and  Held  Interleukin-6, Plasma  Once,   R     Signed and Held   Medical illustratorigned and Held  Procalcitonin  Once,   R     Signed and Held   Signed and Held  CBC with Differential/Platelet  Daily,   R     Signed and Held   Medical illustratorigned and Held  Comprehensive metabolic panel  Daily,   R     Signed and Held   Signed and Held  C-reactive protein  Daily,   R     Signed and Held   Signed and Held  CK  Daily,   R     Signed and Held   Signed and Held  D-dimer, quantitative (not at [facility])  Daily,   R     Signed and Held   Signed and Held  Ferritin  Daily,   R     Signed and Held   Signed and Held  Interleukin-6, Plasma  Daily,   R     Signed and Held   Signed and Held  Magnesium  Daily,   R     Signed and Held   Signed and Held  Hepatic function panel  Add-on,   R     Signed and Held   Signed and Held  Differential  Add-on,   R     Signed and Held          Vitals/Pain Today's Vitals   05/31/19 1915 05/31/19 1930 05/31/19 2000 05/31/19 2015  BP: (!) 135/91 136/88 131/87 (!) 133/92  Pulse: (!) 58 (!) 56 (!) 58 (!) 59  Resp: (!) 21 (!) 21 (!) 25 (!) 21  Temp:      TempSrc:      SpO2:  97% 94% 95% 96%  Weight:      Height:      PainSc:        Isolation Precautions Droplet and Contact precautions  Medications Medications  sodium chloride (PF) 0.9 % injection (has no administration in time range)  sodium chloride 0.9 % bolus 1,000 mL (0 mLs Intravenous Stopped 05/31/19 1501)  iohexol (OMNIPAQUE) 350 MG/ML injection 100 mL (100 mLs Intravenous Contrast Given 05/31/19 1404)  sodium chloride 0.9 % bolus 1,000 mL (0 mLs Intravenous Stopped 05/31/19 2023)    Mobility walks with person assist Low fall risk   Focused Assessments Cardiac Assessment Handoff:    Lab Results  Component Value Date   CKTOTAL 155 05/13/2011   CKMB 3.0 05/13/2011   TROPONINI <0.03 02/19/2019   Lab Results  Component Value Date   DDIMER 1.26 (H) 05/31/2019   Does the Patient currently have chest pain? No  ,  Pulmonary Assessment Handoff:  Lung sounds:   O2 Device: Room Air        R Recommendations: See Admitting Provider Note  Report given to:   Additional Notes: Covid positive

## 2019-05-31 NOTE — ED Notes (Signed)
Patient updated on POC. VSS. IV Fluids administered per order. Warm blanket provided to patient. Patient denies needs at this time. Bed in lowest position with side rails up. Call light and telephone within reach.

## 2019-05-31 NOTE — ED Notes (Signed)
Patient transported to CT 

## 2019-05-31 NOTE — ED Provider Notes (Signed)
  Physical Exam  BP 128/82   Pulse (!) 55   Temp 98 F (36.7 C) (Oral)   Resp 15   Ht 5\' 9"  (1.753 m)   Wt 108.9 kg   SpO2 97%   BMI 35.44 kg/m   Assumed care from Margarita Mail, PA-C at 1730. Briefly, the patient is a 58 y.o. male with PMHx of  has a past medical history of Achilles tendon rupture (right), Arthritis, Chest pain, Diabetes mellitus, Diabetes mellitus without complication ([place]), Drug abuse ([place]), GERD (gastroesophageal reflux disease), Hypertension, and Renal insufficiency. here with syncopal episode occurred while he was at work.  Patient had an episode of prodromal syncope where he felt that he was going to pass out before he fell to the floor.  He was orthostatic here in the emergency department. He has been tachypneic.   Labs Reviewed  SARS CORONAVIRUS 2 (HOSPITAL ORDER, [facility]) - Abnormal; Notable for the following components:      Result Value   SARS Coronavirus 2 POSITIVE (*)    All other components within normal limits  BASIC METABOLIC PANEL - Abnormal; Notable for the following components:   Glucose, Bld 189 (*)    BUN 21 (*)    Creatinine, Ser 1.71 (*)    Calcium 8.8 (*)    GFR calc non Af Amer 44 (*)    GFR calc Af Amer 51 (*)    All other components within normal limits  CBC - Abnormal; Notable for the following components:   WBC 3.9 (*)    All other components within normal limits  URINALYSIS, ROUTINE W REFLEX MICROSCOPIC - Abnormal; Notable for the following components:   Protein, ur 30 (*)    Bacteria, UA RARE (*)    All other components within normal limits  D-DIMER, QUANTITATIVE (NOT AT [facility]) - Abnormal; Notable for the following components:   D-Dimer, Quant 1.26 (*)    All other components within normal limits  CBG MONITORING, ED - Abnormal; Notable for the following components:   Glucose-Capillary 192 (*)    All other components within normal limits    Course of Care:  Tachypnea improved but patient  breathing approximately 22-24 x per minute most of ED course.   ED Course/Procedures   Clinical Course as of May 30 1857  Thu May 31, 2019  1856 COVID test is positive.   SARS Coronavirus 2(!): POSITIVE [AM]    Clinical Course User Index [AM] Albesa Seen, PA-C    Procedures  MDM   This is a 58 year old male past medical history of diabetes, drug abuse, cocaine chest pain, hypertension, renal insufficiency.  Work-up thus far demonstrating slight leukopenia 3.9.  He has elevated glucose 189.  Renal function consistent with prior.  D-dimer significantly elevated 1.26 prompting CT chest PE study.  This is negative for pulmonary embolism, but patient did have groundglass opacities consistent with possible atypical viral infection.  SARS-CoV-2 test sent and resulted positive.  Given the syncope, tachypnea here in the emergency department, and positive SARS-CoV-2 test, recommend admission.  Patient has had transient hypoxia at the time of syncope.  Dr. Myna Hidalgo of [facility] hospitalist to admit.  Appreciate his involvement.    Tamala Julian 05/31/19 1942    Davonna Belling, MD 05/31/19 2202

## 2019-05-31 NOTE — H&P (Signed)
History and Physical    Ryan [name] EAV:[id number] DOB: 02/07/1961 DOA: 05/31/2019  PCP: Lavinia SharpsPlacey, Mary Ann, NP   Patient coming from: Home    Chief Complaint: Weakness, fatigue, syncope   HPI: Ryan [name] is a 58 y.o. male with medical history significant for insulin-dependent diabetes mellitus, polysubstance abuse, hypertension, and chronic kidney disease stage III, now presenting to the emergency department after a syncopal episode.  Patient reports that he developed a nonspecific malaise with generalized weakness, mild headache, and loss of appetite approximately 4 days ago.  Generalized weakness and fatigue has progressively worsened over this interval and the patient has become lightheaded upon standing.  Today while at work, he became acutely lightheaded while standing up and suffered a brief syncopal episode.  He denies any associated chest pain or palpitations.  He denies any recent travel, sick contacts, fevers, or chills.  He denies any significant cough, reports some mild exertional dyspnea.  ED Course: Upon arrival to the ED, patient is found to be afebrile, saturating low 90s on room air, mildly tachypneic, and with blood pressure 106 systolic.  EKG features a sinus rhythm.  Noncontrast head CT is negative for acute intracranial abnormality.  Chemistry panel is notable for a creatinine 1.71, similar to priors.  CBC features a leukopenia with WBC 3900.  D-dimer was elevated to 1.26 and CTA chest, while negative for PE, demonstrates bilateral groundglass opacities concerning for atypical infection.  Patient was given 2 L normal saline in the emergency department and hospitalists are asked to admit.  Review of Systems:  All other systems reviewed and apart from HPI, are negative.  Past Medical History:  Diagnosis Date  . Achilles tendon rupture right  . Arthritis    back and shoulders  . Chest pain    due to cocaine use  . Diabetes mellitus   . Diabetes mellitus without  complication (HCC)   . Drug abuse (HCC)    cocaine, marijuana abuse  . GERD (gastroesophageal reflux disease)   . Hypertension   . Renal insufficiency     Past Surgical History:  Procedure Laterality Date  . ACHILLES TENDON SURGERY Right 05/02/2014   Procedure: RIGHT ACHILLES TENDON REPAIR;  Surgeon: Javier DockerJeffrey C Beane, MD;  Location: [facility];  Service: Orthopedics;  Laterality: Right;  . CARDIAC CATHETERIZATION  05.31.2012   showing patent coronaries with no visualized vasospasm and EF of 55-60 %  . ORBITAL FRACTURE SURGERY Left 2-3 yrs ago     reports that he has never smoked. He has never used smokeless tobacco. He reports current alcohol use. He reports current drug use. Drugs: Cocaine and Marijuana.  No Known Allergies  History reviewed. No pertinent family history.   Prior to Admission medications   Medication Sig Start Date End Date Taking? Authorizing Provider  amLODipine (NORVASC) 10 MG tablet Take 10 mg by mouth every morning.     [provider]  carvedilol (COREG) 25 MG tablet Take 25 mg by mouth 2 (two) times daily.     [provider]  famotidine (PEPCID) 20 MG tablet Take 1 tablet (20 mg total) by mouth 2 (two) times daily for 5 days. Patient not taking: Reported on 05/29/2019 05/01/19 05/29/19  Joy, Ines BloomerShawn C, PA-C  furosemide (LASIX) 20 MG tablet Take 1 tablet (20 mg total) by mouth daily. Patient not taking: Reported on 02/26/2018 05/15/17   Jacalyn LefevreHaviland, Julie, MD  insulin NPH-regular Human (NOVOLIN 70/30) (70-30) 100 UNIT/ML injection Take a directed. Patient taking differently: Inject 20  Units into the skin 2 (two) times daily with a meal. Take a directed. 12/21/17   Mardella Layman, MD  lisinopril-hydrochlorothiazide (PRINZIDE,ZESTORETIC) 20-25 MG per tablet Take 1 tablet by mouth every morning.     [provider]  pantoprazole (PROTONIX) 20 MG tablet Take 1 tablet (20 mg total) by mouth 2 (two) times daily. 02/19/19   Raeford Razor, MD  potassium  chloride 20 MEQ TBCR Take 20 mEq by mouth daily. Patient not taking: Reported on 05/29/2019 05/28/19   Eyvonne Mechanic, PA-C    Physical Exam: Vitals:   05/31/19 1745 05/31/19 1800 05/31/19 1815 05/31/19 1830  BP: 114/81 111/79 120/86 128/82  Pulse: (!) 56 (!) 59 (!) 59 (!) 55  Resp: (!) 21 (!) Temp:      TempSrc:      SpO2: 96% 94% 92% 97%  Weight:      Height:        Constitutional: NAD, calm  Eyes: PERTLA, lids and conjunctivae normal ENMT: Mucous membranes are moist. Posterior pharynx clear of any exudate or lesions.   Neck: normal, supple, no masses, no thyromegaly Respiratory: no wheezing, rales. Normal respiratory effort. No accessory muscle use.  Cardiovascular: S1 & S2 heard, regular rate and rhythm. No extremity edema.   Abdomen: No distension, no tenderness, soft. Bowel sounds active.  Musculoskeletal: no clubbing / cyanosis. No joint deformity upper and lower extremities.    Skin: no significant rashes, lesions, ulcers. Poor turgor. Neurologic: Dysconjugate gaze. Sensation intact. Strength 5/5 in all 4 limbs.  Psychiatric: Alert and oriented x 3. Pleasant and cooperative.    Labs on Admission: I have personally reviewed following labs and imaging studies  CBC: Recent Labs  Lab 05/28/19 1455 05/29/19 1053 05/31/19 1129  WBC 3.1* 3.3* 3.9*  NEUTROABS 1.6* 2.2  --   HGB 15.3 15.1 15.5  HCT 45.3 44.8 46.1  MCV 88.1 87.5 89.7  PLT 145* 138* 161   Basic Metabolic Panel: Recent Labs  Lab 05/28/19 1455 05/29/19 1053 05/31/19 1129  NA 133* 133* 135  K 3.0* 3.4* 3.7  CL 100 101 101  CO2 18* 20* 23  GLUCOSE 156* 202* 189*  BUN 19 13 21*  CREATININE 2.10* 1.68* 1.71*  CALCIUM 8.5* 8.6* 8.8*   GFR: Estimated Creatinine Clearance: 59.4 mL/min (A) (by C-G formula based on SCr of 1.71 mg/dL (H)). Liver Function Tests: Recent Labs  Lab 05/29/19 1053  AST 26  ALT 21  ALKPHOS 90  BILITOT 0.5  PROT 7.2  ALBUMIN 3.4*   No results for input(s):  LIPASE, AMYLASE in the last 168 hours. No results for input(s): AMMONIA in the last 168 hours. Coagulation Profile: No results for input(s): INR, PROTIME in the last 168 hours. Cardiac Enzymes: No results for input(s): CKTOTAL, CKMB, CKMBINDEX, TROPONINI in the last 168 hours. BNP (last 3 results) No results for input(s): PROBNP in the last 8760 hours. HbA1C: No results for input(s): HGBA1C in the last 72 hours. CBG: Recent Labs  Lab 05/28/19 1444 05/31/19 1106  GLUCAP 157* 192*   Lipid Profile: No results for input(s): CHOL, HDL, LDLCALC, TRIG, CHOLHDL, LDLDIRECT in the last 72 hours. Thyroid Function Tests: No results for input(s): TSH, T4TOTAL, FREET4, T3FREE, THYROIDAB in the last 72 hours. Anemia Panel: No results for input(s): VITAMINB12, FOLATE, FERRITIN, TIBC, IRON, RETICCTPCT in the last 72 hours. Urine analysis:    Component Value Date/Time   COLORURINE YELLOW 05/31/2019 1623   APPEARANCEUR CLEAR 05/31/2019 1623  LABSPEC 1.017 05/31/2019 1623   PHURINE 6.0 05/31/2019 1623   GLUCOSEU NEGATIVE 05/31/2019 1623   HGBUR NEGATIVE 05/31/2019 1623   BILIRUBINUR NEGATIVE 05/31/2019 1623   KETONESUR NEGATIVE 05/31/2019 1623   PROTEINUR 30 (A) 05/31/2019 1623   UROBILINOGEN 1.0 11/06/2013 1612   NITRITE NEGATIVE 05/31/2019 1623   LEUKOCYTESUR NEGATIVE 05/31/2019 1623   Sepsis Labs: @LABRCNTIP (procalcitonin:4,lacticidven:4) ) Recent Results (from the past 240 hour(s))  SARS Coronavirus 2 (CEPHEID- Performed in [facility] lab), Hosp Order     Status: Abnormal   Collection Time: 05/31/19  5:12 PM   Specimen: Nasopharyngeal Swab  Result Value Ref Range Status   SARS Coronavirus 2 POSITIVE (A) NEGATIVE Final    Comment: RESULT CALLED TO, READ BACK BY AND VERIFIED WITH: S.BINGHAM AT 1845 ON 05/31/19 BY N.THOMPSON (NOTE) If result is NEGATIVE SARS-CoV-2 target nucleic acids are NOT DETECTED. The SARS-CoV-2 RNA is generally detectable in upper and lower   respiratory specimens during the acute phase of infection. The lowest  concentration of SARS-CoV-2 viral copies this assay can detect is 250  copies / mL. A negative result does not preclude SARS-CoV-2 infection  and should not be used as the sole basis for treatment or other  patient management decisions.  A negative result may occur with  improper specimen collection / handling, submission of specimen other  than nasopharyngeal swab, presence of viral mutation(s) within the  areas targeted by this assay, and inadequate number of viral copies  (<250 copies / mL). A negative result must be combined with clinical  observations, patient history, and epidemiological information. If result is POSITIVE SARS-CoV-2 target nucleic acids are DETEC TED. The SARS-CoV-2 RNA is generally detectable in upper and lower  respiratory specimens during the acute phase of infection.  Positive  results are indicative of active infection with SARS-CoV-2.  Clinical  correlation with patient history and other diagnostic information is  necessary to determine patient infection status.  Positive results do  not rule out bacterial infection or co-infection with other viruses. If result is PRESUMPTIVE POSTIVE SARS-CoV-2 nucleic acids MAY BE PRESENT.   A presumptive positive result was obtained on the submitted specimen  and confirmed on repeat testing.  While 2019 novel coronavirus  (SARS-CoV-2) nucleic acids may be present in the submitted sample  additional confirmatory testing may be necessary for epidemiological  and / or clinical management purposes  to differentiate between  SARS-CoV-2 and other Sarbecovirus currently known to infect humans.  If clinically indicated additional testing with an alternate test  methodology (LAB7 453) is advised. The SARS-CoV-2 RNA is generally  detectable in upper and lower respiratory specimens during the acute  phase of infection. The expected result is Negative. Fact  Sheet for Patients:  BoilerBrush.com.cyhttps://www.fda.gov/media/136312/download Fact Sheet for Healthcare Providers: https://pope.com/https://www.fda.gov/media/136313/download This test is not yet approved or cleared by the [place] FDA and has been authorized for detection and/or diagnosis of SARS-CoV-2 by FDA under an Emergency Use Authorization (EUA).  This EUA will remain in effect (meaning this test can be used) for the duration of the COVID-19 declaration under Section 564(b)(1) of the Act, 21 U.S.C. section 360bbb-3(b)(1), unless the authorization is terminated or revoked sooner. Performed at [facility], [address] [place], [place] [address]      Radiological Exams on Admission: Ct Head Wo Contrast  Result Date: 05/31/2019 CLINICAL DATA:  Tired and weak x3 days.  Head trauma. EXAM: CT HEAD WITHOUT CONTRAST TECHNIQUE: Contiguous axial images were obtained from  the base of the skull through the vertex without intravenous contrast. COMPARISON:  No recent relevant CT available for comparison. FINDINGS: Brain: No evidence of acute infarction, hemorrhage, hydrocephalus, extra-axial collection or mass lesion/mass effect. Vascular: No hyperdense vessel or unexpected calcification. Skull: Normal. Negative for fracture or focal lesion. The patient is status post remote repair of the left frontal bone. There is an old defect involving the left lamina papyracea. Sinuses/Orbits: No acute finding. Other: None. IMPRESSION: No acute intracranial abnormality. Electronically Signed   By: Katherine Mantlehristopher  Green M.D.   On: 05/31/2019 13:26   Ct Angio Chest Pe W And/or Wo Contrast  Result Date: 05/31/2019 CLINICAL DATA:  PE suspected, syncope EXAM: CT ANGIOGRAPHY CHEST WITH CONTRAST TECHNIQUE: Multidetector CT imaging of the chest was performed using the standard protocol during bolus administration of intravenous contrast. Multiplanar CT image reconstructions and MIPs were obtained to evaluate the vascular  anatomy. CONTRAST:  100mL OMNIPAQUE IOHEXOL 350 MG/ML SOLN COMPARISON:  02/26/2018 FINDINGS: Cardiovascular: Satisfactory opacification of the pulmonary arteries to the segmental level. No evidence of pulmonary embolism. Cardiomegaly. No pericardial effusion. Mediastinum/Nodes: No enlarged mediastinal, hilar, or axillary lymph nodes. Hiatal hernia. Thyroid gland, trachea, and esophagus demonstrate no significant findings. Lungs/Pleura: There is extensive bilateral, predominantly subpleural ground-glass and heterogeneous pulmonary opacity. There are stable, benign small pulmonary nodules of the right upper and middle lobes (series 11, image 62, 59). No pleural effusion or pneumothorax. Upper Abdomen: No acute abnormality. Musculoskeletal: No chest wall abnormality. No acute or significant osseous findings. Review of the MIP images confirms the above findings. IMPRESSION: 1.  Negative examination for pulmonary embolism. 2. There is extensive bilateral, predominantly subpleural ground-glass and heterogeneous pulmonary opacity. There are a spectrum of findings in the lungs which can be seen with acute atypical infection (as well as other non-infectious etiologies). In particular, viral pneumonia (including COVID-19) should be considered in the appropriate clinical setting. Electronically Signed   By: Lauralyn PrimesAlex  Bibbey M.D.   On: 05/31/2019 14:47    EKG: Independently reviewed. Sinus rhythm, rate 68, QTc 430 ms.   Assessment/Plan   1. COVID-19 infection  - Presents with 4 days of progressive fatigue, malaise, lightheadedness on standing, and then a syncopal episode today  - CTA was negative for PE but notable for bilateral ground-glass densities in a pattern concerning for atypical infection and COVID-19 swab is positive  - Add differential and LFT's to ED labs, check inflammatory markers, SLIV for now, continue droplet and contact precautions, supportive care    2. Syncope, orthostasis  - Presents with a  syncopal episode after a couple days of progressive fatigue and lightheadedness on standing  - There is a 20 mmHg drop in SBP from sitting to standing  - No chest pain, unremarkable cardiac exam, and no concerning features on EKG  - He was fluid-resuscitated in ED with 2 liters NS  - Repeat orthostatic vitals in am   3. Insulin-dependent DM  - No recent A1c on file  - Use SSI with Novolog while in hospital    4. CKD III  - SCr is 1.71 on admission, similar to priors  - Renally-dose medications    5. Hypertension  - SBP low 100's, will hold scheduled antihypertensives initially     PPE: Mask, face shield, gown, gloves. Patient wearing mask.  DVT prophylaxis: Lovenox  Code Status: Full  Family Communication: Discussed with patient  Consults called: None Admission status: Observation     Briscoe Deutscherimothy S Opyd, MD [facility] Pager [phone]  If 7PM-7AM, please contact night-coverage [email or web address] Password [facility]  05/31/2019, 7:22 PM

## 2019-05-31 NOTE — ED Provider Notes (Addendum)
[facility] DEPT Provider Note   CSN: [id number] Arrival date & time: 05/31/19  1048     History   Chief Complaint Chief Complaint  Patient presents with  . Loss of Consciousness    HPI Ryan [name] is a 58 y.o. male with a past medical history of diabetes, drug abuse, cocaine chest pain, hypertension, renal insufficiency presents emergency department chief complaint of syncope.  Patient states that he has just been feeling generally weak over the past 2 days.  He denies sink feelings of presyncope or orthostatic hypotension.  He states that he went into work today and as he was standing to his doing his work he began to feel as though he was going to pass out and then lost consciousness and fell to the floor hitting his head.  He denies severe headache, neck pain.  He denies chest pain or shortness of breath.  He denies history of DVT or PE, he denies unilateral leg swelling or calf pain.  He denies volume loss from nausea vomiting or diarrhea.  He denies melena or hematochezia.  He is not taking any new medications.  States that his blood sugars have been running in the 100s.     HPI  Past Medical History:  Diagnosis Date  . Achilles tendon rupture right  . Arthritis    back and shoulders  . Chest pain    due to cocaine use  . Diabetes mellitus   . Diabetes mellitus without complication ([place])   . Drug abuse ([place])    cocaine, marijuana abuse  . GERD (gastroesophageal reflux disease)   . Hypertension   . Renal insufficiency     Patient Active Problem List   Diagnosis Date Noted  . Right ankle pain 05/15/2014  . Need for Tdap vaccination 05/15/2014  . Unspecified essential hypertension 05/14/2014  . Diabetes mellitus ([place]) 05/14/2014  . Rupture of right Achilles tendon 05/02/2014  . Tendon rupture, Achilles 05/02/2014    Past Surgical History:  Procedure Laterality Date  . ACHILLES TENDON SURGERY Right 05/02/2014   Procedure: RIGHT  ACHILLES TENDON REPAIR;  Surgeon: Johnn Hai, MD;  Location: [facility];  Service: Orthopedics;  Laterality: Right;  . CARDIAC CATHETERIZATION  05.31.2012   showing patent coronaries with no visualized vasospasm and EF of 55-60 %  . ORBITAL FRACTURE SURGERY Left 2-3 yrs ago        Home Medications    Prior to Admission medications   Medication Sig Start Date End Date Taking? Authorizing Provider  amLODipine (NORVASC) 10 MG tablet Take 10 mg by mouth every morning.     [provider]  carvedilol (COREG) 25 MG tablet Take 25 mg by mouth 2 (two) times daily.     [provider]  famotidine (PEPCID) 20 MG tablet Take 1 tablet (20 mg total) by mouth 2 (two) times daily for 5 days. Patient not taking: Reported on 05/29/2019 05/01/19 05/29/19  Joy, Raquel Sarna C, PA-C  furosemide (LASIX) 20 MG tablet Take 1 tablet (20 mg total) by mouth daily. Patient not taking: Reported on 02/26/2018 05/15/17   Isla Pence, MD  insulin NPH-regular Human (NOVOLIN 70/30) (70-30) 100 UNIT/ML injection Take a directed. Patient taking differently: Inject 20 Units into the skin 2 (two) times daily with a meal. Take a directed. 12/21/17   Vanessa Kick, MD  lisinopril-hydrochlorothiazide (PRINZIDE,ZESTORETIC) 20-25 MG per tablet Take 1 tablet by mouth every morning.     [provider]  pantoprazole (PROTONIX) 20  MG tablet Take 1 tablet (20 mg total) by mouth 2 (two) times daily. 02/19/19   Raeford RazorKohut, Stephen, MD  potassium chloride 20 MEQ TBCR Take 20 mEq by mouth daily. Patient not taking: Reported on 05/29/2019 05/28/19   Eyvonne MechanicHedges, Jeffrey, PA-C    Family History No family history on file.  Social History Social History   Tobacco Use  . Smoking status: Never Smoker  . Smokeless tobacco: Never Used  Substance Use Topics  . Alcohol use: Yes    Comment: occassional  . Drug use: Yes    Types: Cocaine, Marijuana    Comment: last cocaine use 2 to 3 yrs ago, last marijuana use 4 to 5 years ago      Allergies   Patient has no known allergies.   Review of Systems Review of Systems  Ten systems reviewed and are negative for acute change, except as noted in the HPI. \ Physical Exam Updated Vital Signs BP 123/79   Pulse 62   Temp 98 F (36.7 C) (Oral)   Resp 18   Ht 5\' 9"  (1.753 m)   Wt 108.9 kg   SpO2 98%   BMI 35.44 kg/m   Physical Exam Vitals signs and nursing note reviewed.  Constitutional:      General: He is not in acute distress.    Appearance: He is well-developed. He is not diaphoretic.  HENT:     Head: Normocephalic and atraumatic.  Eyes:     General: No scleral icterus.    Conjunctiva/sclera: Conjunctivae normal.     Pupils: Pupils are equal, round, and reactive to light.     Comments: Strabismus of the left eye  Neck:     Musculoskeletal: Normal range of motion and neck supple.  Cardiovascular:     Rate and Rhythm: Normal rate and regular rhythm.     Heart sounds: Normal heart sounds.  Pulmonary:     Effort: Pulmonary effort is normal. No respiratory distress.     Breath sounds: Normal breath sounds.  Abdominal:     Palpations: Abdomen is soft.     Tenderness: There is no abdominal tenderness.  Musculoskeletal:     Right lower leg: No edema.     Left lower leg: No edema.  Skin:    General: Skin is warm and dry.  Neurological:     General: No focal deficit present.     Mental Status: He is alert and oriented to person, place, and time.  Psychiatric:        Behavior: Behavior normal.      ED Treatments / Results  Labs (all labs ordered are listed, but only abnormal results are displayed) Labs Reviewed  BASIC METABOLIC PANEL - Abnormal; Notable for the following components:      Result Value   Glucose, Bld 189 (*)    BUN 21 (*)    Creatinine, Ser 1.71 (*)    Calcium 8.8 (*)    GFR calc non Af Amer 44 (*)    GFR calc Af Amer 51 (*)    All other components within normal limits  CBC - Abnormal; Notable for the following components:    WBC 3.9 (*)    All other components within normal limits  D-DIMER, QUANTITATIVE (NOT AT [facility]) - Abnormal; Notable for the following components:   D-Dimer, Quant 1.26 (*)    All other components within normal limits  CBG MONITORING, ED - Abnormal; Notable for the following components:   Glucose-Capillary 192 (*)  All other components within normal limits  URINALYSIS, ROUTINE W REFLEX MICROSCOPIC    EKG EKG Interpretation  Date/Time:  Thursday May 31 2019 11:01:25 EDT Ventricular Rate:  68 PR Interval:    QRS Duration: 78 QT Interval:  404 QTC Calculation: 430 R Axis:   3 Text Interpretation:  Sinus rhythm Probable left atrial enlargement When compared with ECG of 05/28/2019 No significant change was found Confirmed by Samuel JesterMcManus, Kathleen [phone]([id number]) on 05/31/2019 11:57:00 AM   Radiology Ct Head Wo Contrast  Result Date: 05/31/2019 CLINICAL DATA:  Tired and weak x3 days.  Head trauma. EXAM: CT HEAD WITHOUT CONTRAST TECHNIQUE: Contiguous axial images were obtained from the base of the skull through the vertex without intravenous contrast. COMPARISON:  No recent relevant CT available for comparison. FINDINGS: Brain: No evidence of acute infarction, hemorrhage, hydrocephalus, extra-axial collection or mass lesion/mass effect. Vascular: No hyperdense vessel or unexpected calcification. Skull: Normal. Negative for fracture or focal lesion. The patient is status post remote repair of the left frontal bone. There is an old defect involving the left lamina papyracea. Sinuses/Orbits: No acute finding. Other: None. IMPRESSION: No acute intracranial abnormality. Electronically Signed   By: Katherine Mantlehristopher  Green M.D.   On: 05/31/2019 13:26    Procedures Procedures (including critical care time)  Medications Ordered in ED Medications  iohexol (OMNIPAQUE) 350 MG/ML injection 100 mL (has no administration in time range)  sodium chloride 0.9 % bolus 1,000 mL (1,000 mLs Intravenous New Bag/Given 05/31/19  1212)     Initial Impression / Assessment and Plan / ED Course  I have reviewed the triage vital signs and the nursing notes.  Pertinent labs & imaging results that were available during my care of the patient were reviewed by me and considered in my medical decision making (see chart for details).  Clinical Course as of May 31 1756  Thu May 31, 2019  1856 COVID test is positive.   SARS Coronavirus 2(!): POSITIVE [AM]    Clinical Course User Index [AM] Elisha PonderMurray, Alyssa B, PA-C       58 year old male is a history of cocaine abuse.  He had a syncopal event today.  His orthostatic vital signs are positive.  Urine shows no abnormalities except for some mild proteinuria.  BMP shows chronic renal insufficiency.  EKG shows normal sinus rhythm without evidence of arrhythmia. D-dimer markedly elevated.  And I ordered a CT angiogram of the chest to rule out pulmonary embolus as cause of his syncope which showed no embolus however did show groundglass opacities throughout the lung tissue.  Has a pending coronavirus test.  I [facility] signout [facility] who will assume care for appropriate disposition.  Ryan Cobbsnthony Gadbois was evaluated in Emergency Department on 05/31/2019 for the symptoms described in the history of present illness. He was evaluated in the context of the global COVID-19 pandemic, which necessitated consideration that the patient might be at risk for infection with the SARS-CoV-2 virus that causes COVID-19. Institutional protocols and algorithms that pertain to the evaluation of patients at risk for COVID-19 are in a state of rapid change based on information released by regulatory bodies including the CDC and federal and state organizations. These policies and algorithms were followed during the patient's care in the ED.   Final Clinical Impressions(s) / ED Diagnoses   Final diagnoses:  None    ED Discharge Orders    None       Arthor CaptainHarris, Jatavian Calica, PA-C 05/31/19 1739     Atoya Andrew,  Romie Leveebigail, PA-C 06/01/19 1757    Benjiman CorePickering, Nathan, MD 06/01/19 [phone]

## 2019-05-31 NOTE — ED Triage Notes (Signed)
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

## 2019-06-01 ENCOUNTER — Encounter (HOSPITAL_COMMUNITY): Payer: Self-pay

## 2019-06-01 DIAGNOSIS — I951 Orthostatic hypotension: Secondary | ICD-10-CM

## 2019-06-01 DIAGNOSIS — U071 COVID-19: Secondary | ICD-10-CM

## 2019-06-01 LAB — CBC WITH DIFFERENTIAL/PLATELET
Abs Immature Granulocytes: 0 10*3/uL (ref 0.00–0.07)
Basophils Absolute: 0 10*3/uL (ref 0.0–0.1)
Basophils Relative: 1 %
Eosinophils Absolute: 0 10*3/uL (ref 0.0–0.5)
Eosinophils Relative: 1 %
HCT: 41.4 % (ref 39.0–52.0)
Hemoglobin: 13.7 g/dL (ref 13.0–17.0)
Immature Granulocytes: 0 %
Lymphocytes Relative: 54 %
Lymphs Abs: 1.5 10*3/uL (ref 0.7–4.0)
MCH: 29.4 pg (ref 26.0–34.0)
MCHC: 33.1 g/dL (ref 30.0–36.0)
MCV: 88.8 fL (ref 80.0–100.0)
Monocytes Absolute: 0.4 10*3/uL (ref 0.1–1.0)
Monocytes Relative: 13 %
Neutro Abs: 0.9 10*3/uL — ABNORMAL LOW (ref 1.7–7.7)
Neutrophils Relative %: 31 %
Platelets: 165 10*3/uL (ref 150–400)
RBC: 4.66 MIL/uL (ref 4.22–5.81)
RDW: 14.6 % (ref 11.5–15.5)
WBC: 2.7 10*3/uL — ABNORMAL LOW (ref 4.0–10.5)
nRBC: 0 % (ref 0.0–0.2)

## 2019-06-01 LAB — RAPID URINE DRUG SCREEN, HOSP PERFORMED
Amphetamines: NOT DETECTED
Barbiturates: NOT DETECTED
Benzodiazepines: NOT DETECTED
Cocaine: POSITIVE — AB
Opiates: NOT DETECTED
Tetrahydrocannabinol: NOT DETECTED

## 2019-06-01 LAB — FERRITIN: Ferritin: 120 ng/mL (ref 24–336)

## 2019-06-01 LAB — HEPATIC FUNCTION PANEL
ALT: 20 U/L (ref 0–44)
AST: 23 U/L (ref 15–41)
Albumin: 2.9 g/dL — ABNORMAL LOW (ref 3.5–5.0)
Alkaline Phosphatase: 78 U/L (ref 38–126)
Bilirubin, Direct: 0.1 mg/dL (ref 0.0–0.2)
Indirect Bilirubin: 0 mg/dL — ABNORMAL LOW (ref 0.3–0.9)
Total Bilirubin: 0.1 mg/dL — ABNORMAL LOW (ref 0.3–1.2)
Total Protein: 6.9 g/dL (ref 6.5–8.1)

## 2019-06-01 LAB — TROPONIN I: Troponin I: <0.03 ng/mL (ref <0.03)

## 2019-06-01 LAB — GLUCOSE, CAPILLARY
Glucose-Capillary: 191 mg/dL — ABNORMAL HIGH (ref 70–99)
Glucose-Capillary: 128 mg/dL — ABNORMAL HIGH (ref 70–99)

## 2019-06-01 LAB — COMPREHENSIVE METABOLIC PANEL
ALT: 20 U/L (ref 0–44)
AST: 21 U/L (ref 15–41)
Albumin: 3 g/dL — ABNORMAL LOW (ref 3.5–5.0)
Alkaline Phosphatase: 76 U/L (ref 38–126)
Anion gap: 10 (ref 5–15)
BUN: 17 mg/dL (ref 6–20)
CO2: 22 mmol/L (ref 22–32)
Calcium: 8.3 mg/dL — ABNORMAL LOW (ref 8.9–10.3)
Chloride: 102 mmol/L (ref 98–111)
Creatinine, Ser: 1.37 mg/dL — ABNORMAL HIGH (ref 0.61–1.24)
GFR calc Af Amer: >60 mL/min (ref >60)
GFR calc non Af Amer: 58 mL/min — ABNORMAL LOW (ref >60)
Glucose, Bld: 225 mg/dL — ABNORMAL HIGH (ref 70–99)
Potassium: 3.4 mmol/L — ABNORMAL LOW (ref 3.5–5.1)
Sodium: 134 mmol/L — ABNORMAL LOW (ref 135–145)
Total Bilirubin: 0.5 mg/dL (ref 0.3–1.2)
Total Protein: 7.1 g/dL (ref 6.5–8.1)

## 2019-06-01 LAB — PROCALCITONIN: Procalcitonin: <0.1 ng/mL

## 2019-06-01 LAB — C-REACTIVE PROTEIN: CRP: 4.6 mg/dL — ABNORMAL HIGH (ref <1.0)

## 2019-06-01 LAB — ABO/RH: ABO/RH(D): A POS

## 2019-06-01 LAB — FIBRINOGEN: Fibrinogen: 668 mg/dL — ABNORMAL HIGH (ref 210–475)

## 2019-06-01 LAB — MAGNESIUM: Magnesium: 2.2 mg/dL (ref 1.7–2.4)

## 2019-06-01 LAB — D-DIMER, QUANTITATIVE: D-Dimer, Quant: 1.35 ug/mL-FEU — ABNORMAL HIGH (ref 0.00–0.50)

## 2019-06-01 LAB — CK: Total CK: 181 U/L (ref 49–397)

## 2019-06-01 LAB — LACTATE DEHYDROGENASE: LDH: 183 U/L (ref 98–192)

## 2019-06-01 MED ORDER — SODIUM CHLORIDE 0.9% FLUSH: 3.0000 mL | Freq: Two times a day (BID) | INTRAVENOUS | Status: DC

## 2019-06-01 MED ORDER — INSULIN ASPART 100 UNIT/ML ~~LOC~~ SOLN: 0.0000 [IU] | Freq: Every day | SUBCUTANEOUS | Status: DC

## 2019-06-01 MED ORDER — SODIUM CHLORIDE 0.9 % IV SOLN: 250.0000 mL | INTRAVENOUS | Status: DC | PRN

## 2019-06-01 MED ORDER — ONDANSETRON HCL 4 MG/2ML IJ SOLN: 4.0000 mg | Freq: Four times a day (QID) | INTRAMUSCULAR | Status: DC | PRN

## 2019-06-01 MED ORDER — INSULIN ASPART 100 UNIT/ML ~~LOC~~ SOLN
0.0000 [IU] | Freq: Three times a day (TID) | SUBCUTANEOUS | Status: DC
  Administered 2019-06-01: 08:00:00 2 [IU] via SUBCUTANEOUS

## 2019-06-01 MED ORDER — SODIUM CHLORIDE 0.9% FLUSH
3.0000 mL | Freq: Two times a day (BID) | INTRAVENOUS | Status: DC
  Administered 2019-06-01: 3 mL via INTRAVENOUS

## 2019-06-01 MED ORDER — ENOXAPARIN SODIUM 40 MG/0.4ML ~~LOC~~ SOLN
40.0000 mg | SUBCUTANEOUS | Status: DC
  Administered 2019-06-01: 40 mg via SUBCUTANEOUS
  Filled 2019-06-01: qty 0.4

## 2019-06-01 MED ORDER — ACETAMINOPHEN 650 MG RE SUPP: 650.0000 mg | Freq: Four times a day (QID) | RECTAL | Status: DC | PRN

## 2019-06-01 MED ORDER — LISINOPRIL-HYDROCHLOROTHIAZIDE 20-25 MG PO TABS: 1.0000 | ORAL_TABLET | Freq: Every morning | ORAL | Status: DC

## 2019-06-01 MED ORDER — POLYETHYLENE GLYCOL 3350 17 G PO PACK: 17.0000 g | PACK | Freq: Every day | ORAL | Status: DC | PRN

## 2019-06-01 MED ORDER — PANTOPRAZOLE SODIUM 20 MG PO TBEC
20.0000 mg | DELAYED_RELEASE_TABLET | Freq: Two times a day (BID) | ORAL | Status: DC
  Administered 2019-06-01: 20 mg via ORAL
  Filled 2019-06-01 (×3): qty 1

## 2019-06-01 MED ORDER — SODIUM CHLORIDE 0.9% FLUSH: 3.0000 mL | INTRAVENOUS | Status: DC | PRN

## 2019-06-01 MED ORDER — AMLODIPINE BESYLATE 10 MG PO TABS: 10.0000 mg | ORAL_TABLET | Freq: Every morning | ORAL | Status: DC

## 2019-06-01 MED ORDER — ACETAMINOPHEN 325 MG PO TABS: 650.0000 mg | ORAL_TABLET | Freq: Four times a day (QID) | ORAL | Status: DC | PRN

## 2019-06-01 MED ORDER — ONDANSETRON HCL 4 MG PO TABS: 4.0000 mg | ORAL_TABLET | Freq: Four times a day (QID) | ORAL | Status: DC | PRN

## 2019-06-01 MED ORDER — HYDROCODONE-ACETAMINOPHEN 5-325 MG PO TABS: 1.0000 | ORAL_TABLET | ORAL | Status: DC | PRN

## 2019-06-01 NOTE — Progress Notes (Signed)
Still awaiting patients ride for discharge.

## 2019-06-01 NOTE — Progress Notes (Signed)
Discussed discharge instructions with patient and provided with paper copy.  Instructed to follow up with PCP within 1 week.  All questions answered.  Belongings returned to patient.

## 2019-06-01 NOTE — Discharge Instructions (Signed)
Person Under Monitoring Name: Ryan [name]  Location: [address] [place] [address]   Infection Prevention Recommendations for Individuals Confirmed to have, or Being Evaluated for, 2019 Novel Coronavirus (COVID-19) Infection Who Receive Care at Home  Individuals who are confirmed to have, or are being evaluated for, COVID-19 should follow the prevention steps below until a healthcare provider or local or state health department says they can return to normal activities.  Stay home except to get medical care You should restrict activities outside your home, except for getting medical care. Do not go to work, school, or public areas, and do not use public transportation or taxis.  Call ahead before visiting your doctor Before your medical appointment, call the healthcare provider and tell them that you have, or are being evaluated for, COVID-19 infection. This will help the healthcare providers office take steps to keep other people from getting infected. Ask your healthcare provider to call the local or state health department.  Monitor your symptoms Seek prompt medical attention if your illness is worsening (e.g., difficulty breathing). Before going to your medical appointment, call the healthcare provider and tell them that you have, or are being evaluated for, COVID-19 infection. Ask your healthcare provider to call the local or state health department.  Wear a facemask You should wear a facemask that covers your nose and mouth when you are in the same room with other people and when you visit a healthcare provider. People who live with or visit you should also wear a facemask while they are in the same room with you.  Separate yourself from other people in your home As much as possible, you should stay in a different room from other people in your home. Also, you should use a separate bathroom, if available.  Avoid sharing household items You should not share  dishes, drinking glasses, cups, eating utensils, towels, bedding, or other items with other people in your home. After using these items, you should wash them thoroughly with soap and water.  Cover your coughs and sneezes Cover your mouth and nose with a tissue when you cough or sneeze, or you can cough or sneeze into your sleeve. Throw used tissues in a lined trash can, and immediately wash your hands with soap and water for at least 20 seconds or use an alcohol-based hand rub.  Wash your [facility] your hands often and thoroughly with soap and water for at least 20 seconds. You can use an alcohol-based hand sanitizer if soap and water are not available and if your hands are not visibly dirty. Avoid touching your eyes, nose, and mouth with unwashed hands.   Prevention Steps for Caregivers and Household Members of Individuals Confirmed to have, or Being Evaluated for, COVID-19 Infection Being Cared for in the Home  If you live with, or provide care at home for, a person confirmed to have, or being evaluated for, COVID-19 infection please follow these guidelines to prevent infection:  Follow healthcare providers instructions Make sure that you understand and can help the patient follow any healthcare provider instructions for all care.  Provide for the patients basic needs You should help the patient with basic needs in the home and provide support for getting groceries, prescriptions, and other personal needs.  Monitor the patients symptoms If they are getting sicker, call his or her medical provider and tell them that the patient has, or is being evaluated for, COVID-19 infection. This will help the healthcare providers office take  steps to keep other people from getting infected. Ask the healthcare provider to call the local or state health department.  Limit the number of people who have contact with the patient  If possible, have only one caregiver for the patient.  Other  household members should stay in another home or place of residence. If this is not possible, they should stay  in another room, or be separated from the patient as much as possible. Use a separate bathroom, if available.  Restrict visitors who do not have an essential need to be in the home.  Keep older adults, very young children, and other sick people away from the patient Keep older adults, very young children, and those who have compromised immune systems or chronic health conditions away from the patient. This includes people with chronic heart, lung, or kidney conditions, diabetes, and cancer.  Ensure good ventilation Make sure that shared spaces in the home have good air flow, such as from an air conditioner or an opened window, weather permitting.  Wash your hands often  Wash your hands often and thoroughly with soap and water for at least 20 seconds. You can use an alcohol based hand sanitizer if soap and water are not available and if your hands are not visibly dirty.  Avoid touching your eyes, nose, and mouth with unwashed hands.  Use disposable paper towels to dry your hands. If not available, use dedicated cloth towels and replace them when they become wet.  Wear a facemask and gloves  Wear a disposable facemask at all times in the room and gloves when you touch or have contact with the patients blood, body fluids, and/or secretions or excretions, such as sweat, saliva, sputum, nasal mucus, vomit, urine, or feces.  Ensure the mask fits over your nose and mouth tightly, and do not touch it during use.  Throw out disposable facemasks and gloves after using them. Do not reuse.  Wash your hands immediately after removing your facemask and gloves.  If your personal clothing becomes contaminated, carefully remove clothing and launder. Wash your hands after handling contaminated clothing.  Place all used disposable facemasks, gloves, and other waste in a lined container before  disposing them with other household waste.  Remove gloves and wash your hands immediately after handling these items.  Do not share dishes, glasses, or other household items with the patient  Avoid sharing household items. You should not share dishes, drinking glasses, cups, eating utensils, towels, bedding, or other items with a patient who is confirmed to have, or being evaluated for, COVID-19 infection.  After the person uses these items, you should wash them thoroughly with soap and water.  Wash laundry thoroughly  Immediately remove and wash clothes or bedding that have blood, body fluids, and/or secretions or excretions, such as sweat, saliva, sputum, nasal mucus, vomit, urine, or feces, on them.  Wear gloves when handling laundry from the patient.  Read and follow directions on labels of laundry or clothing items and detergent. In general, wash and dry with the warmest temperatures recommended on the label.  Clean all areas the individual has used often  Clean all touchable surfaces, such as counters, tabletops, doorknobs, bathroom fixtures, toilets, phones, keyboards, tablets, and bedside tables, every day. Also, clean any surfaces that may have blood, body fluids, and/or secretions or excretions on them.  Wear gloves when cleaning surfaces the patient has come in contact with.  Use a diluted bleach solution (e.g., dilute bleach with 1 part bleach  and 10 parts water) or a household disinfectant with a label that says EPA-registered for coronaviruses. To make a bleach solution at home, add 1 tablespoon of bleach to 1 quart (4 cups) of water. For a larger supply, add  cup of bleach to 1 gallon (16 cups) of water.  Read labels of cleaning products and follow recommendations provided on product labels. Labels contain instructions for safe and effective use of the cleaning product including precautions you should take when applying the product, such as wearing gloves or eye protection  and making sure you have good ventilation during use of the product.  Remove gloves and wash hands immediately after cleaning.  Monitor yourself for signs and symptoms of illness Caregivers and household members are considered close contacts, should monitor their health, and will be asked to limit movement outside of the home to the extent possible. Follow the monitoring steps for close contacts listed on the symptom monitoring form.   ? If you have additional questions, contact your local health department or call the epidemiologist on call at [phone] (available 24/7). ? This guidance is subject to change. For the most up-to-date guidance from [facility], please refer to their website: YouBlogs.pl

## 2019-06-01 NOTE — Progress Notes (Signed)
Ryan [name], is a 58 y.o. male  DOB 1961-09-11  MRN [id number].  Admission date:  05/31/2019  Admitting Physician  Briscoe Deutscher, MD  Discharge Date:  06/01/2019   Primary MD  Placey, Chales Abrahams, NP  Recommendations for primary care physician for things to follow:  - please check CBC, CMP during next visit   Admission Diagnosis  Syncope and collapse [R55] COVID-19 [U07.1, J98.8]   Discharge Diagnosis  Syncope and collapse [R55] COVID-19 [U07.1, J98.8]    Principal Problem:   COVID-19 virus infection Active Problems:   Essential hypertension   Orthostatic syncope   Insulin-requiring or dependent type II diabetes mellitus (HCC)   Drug abuse (HCC)   CKD (chronic kidney disease), stage III (HCC)      Past Medical History:  Diagnosis Date  . Achilles tendon rupture right  . Arthritis    back and shoulders  . Chest pain    due to cocaine use  . Diabetes mellitus   . Diabetes mellitus without complication (HCC)   . Drug abuse (HCC)    cocaine, marijuana abuse  . GERD (gastroesophageal reflux disease)   . Hypertension   . Renal insufficiency     Past Surgical History:  Procedure Laterality Date  . ACHILLES TENDON SURGERY Right 05/02/2014   Procedure: RIGHT ACHILLES TENDON REPAIR;  Surgeon: Javier Docker, MD;  Location: [facility];  Service: Orthopedics;  Laterality: Right;  . CARDIAC CATHETERIZATION  05.31.2012   showing patent coronaries with no visualized vasospasm and EF of 55-60 %  . ORBITAL FRACTURE SURGERY Left 2-3 yrs ago       History of present illness and  Hospital Course:     Kindly see H&P for history of present illness and admission details, please review complete Labs, Consult reports and Test reports for all details in brief  HPI  from the history and physical done on the day of admission 05/31/2019  HPI: Ryan [name] is a 58 y.o. male with medical history  significant for insulin-dependent diabetes mellitus, polysubstance abuse, hypertension, and chronic kidney disease stage III, now presenting to the emergency department after a syncopal episode.  Patient reports that he developed a nonspecific malaise with generalized weakness, mild headache, and loss of appetite approximately 4 days ago.  Generalized weakness and fatigue has progressively worsened over this interval and the patient has become lightheaded upon standing.  Today while at work, he became acutely lightheaded while standing up and suffered a brief syncopal episode.  He denies any associated chest pain or palpitations.  He denies any recent travel, sick contacts, fevers, or chills.  He denies any significant cough, reports some mild exertional dyspnea.  ED Course: Upon arrival to the ED, patient is found to be afebrile, saturating low 90s on room air, mildly tachypneic, and with blood pressure 106 systolic.  EKG features a sinus rhythm.  Noncontrast head CT is negative for acute intracranial abnormality.  Chemistry panel is notable for a creatinine 1.71, similar to priors.  CBC features a  leukopenia with WBC 3900.  D-dimer was elevated to 1.26 and CTA chest, while negative for PE, demonstrates bilateral groundglass opacities concerning for atypical infection.  Patient was given 2 L normal saline in the emergency department and hospitalists are asked to admit.   Hospital Course   COVID-19 infection  - Presents with 4 days of progressive fatigue, malaise, lightheadedness on standing, and then a syncopal episode today  - CTA was negative for PE but notable for bilateral ground-glass densities in a pattern concerning for atypical infection and COVID-19 swab is positive  - H and ferritin within normal limit, CRP mildly elevated, which is reassuring, he denies any chest pain, shortness of breath which is reassuring as well, for now no indication for any treatment .  Syncope, orthostasis  -  Presents with a syncopal episode after a couple days of progressive fatigue and lightheadedness on standing  - There is a 20 mmHg drop in SBP from sitting to standing  - No chest pain, unremarkable cardiac exam, and no concerning features on EKG , he has negative troponins - Further orthostatic this morning, ambulating with no dizziness or lightheadedness -I have discontinued his Coreg given cocaine use, I have instructed him to hold his Norvasc, lisinopril, chlorothiazide and resume in 1 week.  Insulin-dependent DM  - No recent A1c on file  - Use SSI with Novolog while in hospital    CKD III  - SCr is 1.71 on admission, similar to priors , it is 1.37 on discharge - Renally-dose medications    Hypertension  - Prostatic with soft blood pressure on presentation, blood pressure is acceptable currently, continue Coreg in the setting of cocaine use, resume rest of medication in 1 week.  cocaine abuse -He was counseled   Discharge Condition:  stable   Follow UP  Follow-up Information    Placey, Chales Abrahams, NP Follow up in 1 week(s).   Contact information: [address] [place] [address] [phone]             Discharge Instructions  and  Discharge Medications     Discharge Instructions    Discharge instructions   Complete by: As directed    Follow with Primary MD Placey, Chales Abrahams, NP in 7 days   Get CBC, CMP,  checked  by Primary MD next visit.    Activity: As tolerated with Full fall precautions use walker/cane & assistance as needed   Disposition Home    Diet: Heart Healthy .  For Heart failure patients - Check your Weight same time everyday, if you gain over 2 pounds, or you develop in leg swelling, experience more shortness of breath or chest pain, call your Primary MD immediately. Follow Cardiac Low Salt Diet and 1.5 lit/day fluid restriction.   On your next visit with your primary care physician please Get Medicines reviewed and adjusted.    Please request your Prim.MD to go over all Hospital Tests and Procedure/Radiological results at the follow up, please get all Hospital records sent to your Prim MD by signing hospital release before you go home.   If you experience worsening of your admission symptoms, develop shortness of breath, life threatening emergency, suicidal or homicidal thoughts you must seek medical attention immediately by calling 911 or calling your MD immediately  if symptoms less severe.  You Must read complete instructions/literature along with all the possible adverse reactions/side effects for all the Medicines you take and that have been prescribed to you. Take any new  Medicines after you have completely understood and accpet all the possible adverse reactions/side effects.   Do not drive, operating heavy machinery, perform activities at heights, swimming or participation in water activities or provide baby sitting services if your were admitted for syncope or siezures until you have seen by Primary MD or a Neurologist and advised to do so again.  Do not drive when taking Pain medications.    Do not take more than prescribed Pain, Sleep and Anxiety Medications  Special Instructions: If you have smoked or chewed Tobacco  in the last 2 yrs please stop smoking, stop any regular Alcohol  and or any Recreational drug use.  Wear Seat belts while driving.   Please note  You were cared for by a hospitalist during your hospital stay. If you have any questions about your discharge medications or the care you received while you were in the hospital after you are discharged, you can call the unit and asked to speak with the hospitalist on call if the hospitalist that took care of you is not available. Once you are discharged, your primary care physician will handle any further medical issues. Please note that NO REFILLS for any discharge medications will be authorized once you are discharged, as it is imperative that you  return to your primary care physician (or establish a relationship with a primary care physician if you do not have one) for your aftercare needs so that they can reassess your need for medications and monitor your lab values.   Increase activity slowly   Complete by: As directed      Allergies as of 06/01/2019   No Known Allergies     Medication List    STOP taking these medications   carvedilol 25 MG tablet Commonly known as: COREG   furosemide 20 MG tablet Commonly known as: LASIX   lisinopril-hydrochlorothiazide 20-25 MG tablet Commonly known as: ZESTORETIC     TAKE these medications   amLODipine 10 MG tablet Commonly known as: NORVASC Take 1 tablet (10 mg total) by mouth every morning. Resume in 1 week Start taking on: June 07, 2019 What changed:   additional instructions  These instructions start on June 07, 2019. If you are unsure what to do until then, ask your doctor or other care provider.   famotidine 20 MG tablet Commonly known as: PEPCID Take 1 tablet (20 mg total) by mouth 2 (two) times daily for 5 days.   insulin NPH-regular Human (70-30) 100 UNIT/ML injection Commonly known as: NovoLIN 70/30 Take a directed. What changed:   how much to take  how to take this  when to take this   pantoprazole 20 MG tablet Commonly known as: PROTONIX Take 1 tablet (20 mg total) by mouth 2 (two) times daily.   Potassium Chloride ER 20 MEQ Tbcr Take 20 mEq by mouth daily.         Diet and Activity recommendation: See Discharge Instructions above   Consults obtained - None   Major procedures and Radiology Reports - PLEASE review detailed and final reports for all details, in brief -     Ct Head Wo Contrast  Result Date: 05/31/2019 CLINICAL DATA:  Tired and weak x3 days.  Head trauma. EXAM: CT HEAD WITHOUT CONTRAST TECHNIQUE: Contiguous axial images were obtained from the base of the skull through the vertex without intravenous contrast. COMPARISON:  No  recent relevant CT available for comparison. FINDINGS: Brain: No evidence of acute infarction, hemorrhage, hydrocephalus, extra-axial collection  or mass lesion/mass effect. Vascular: No hyperdense vessel or unexpected calcification. Skull: Normal. Negative for fracture or focal lesion. The patient is status post remote repair of the left frontal bone. There is an old defect involving the left lamina papyracea. Sinuses/Orbits: No acute finding. Other: None. IMPRESSION: No acute intracranial abnormality. Electronically Signed   By: Katherine Mantlehristopher  Green M.D.   On: 05/31/2019 13:26   Ct Angio Chest Pe W And/or Wo Contrast  Result Date: 05/31/2019 CLINICAL DATA:  PE suspected, syncope EXAM: CT ANGIOGRAPHY CHEST WITH CONTRAST TECHNIQUE: Multidetector CT imaging of the chest was performed using the standard protocol during bolus administration of intravenous contrast. Multiplanar CT image reconstructions and MIPs were obtained to evaluate the vascular anatomy. CONTRAST:  100mL OMNIPAQUE IOHEXOL 350 MG/ML SOLN COMPARISON:  02/26/2018 FINDINGS: Cardiovascular: Satisfactory opacification of the pulmonary arteries to the segmental level. No evidence of pulmonary embolism. Cardiomegaly. No pericardial effusion. Mediastinum/Nodes: No enlarged mediastinal, hilar, or axillary lymph nodes. Hiatal hernia. Thyroid gland, trachea, and esophagus demonstrate no significant findings. Lungs/Pleura: There is extensive bilateral, predominantly subpleural ground-glass and heterogeneous pulmonary opacity. There are stable, benign small pulmonary nodules of the right upper and middle lobes (series 11, image 62, 59). No pleural effusion or pneumothorax. Upper Abdomen: No acute abnormality. Musculoskeletal: No chest wall abnormality. No acute or significant osseous findings. Review of the MIP images confirms the above findings. IMPRESSION: 1.  Negative examination for pulmonary embolism. 2. There is extensive bilateral, predominantly  subpleural ground-glass and heterogeneous pulmonary opacity. There are a spectrum of findings in the lungs which can be seen with acute atypical infection (as well as other non-infectious etiologies). In particular, viral pneumonia (including COVID-19) should be considered in the appropriate clinical setting. Electronically Signed   By: Lauralyn PrimesAlex  Bibbey M.D.   On: 05/31/2019 14:47   Dg Chest Port 1 View  Result Date: 05/29/2019 CLINICAL DATA:  Cough and weakness.  History of lung nodule EXAM: PORTABLE CHEST 1 VIEW COMPARISON:  Chest two-view 02/19/2019 FINDINGS: Heart size mildly enlarged. Negative for heart failure. Decreased lung volume with mild atelectasis in the bases. Negative for consolidation or effusion. 5 mm nodule along the minor fissure on the right is noted on prior CT of 02/26/2018 and not visualized on the current study. IMPRESSION: Hypoventilation with mild bibasilar atelectasis. Electronically Signed   By: Marlan Palauharles  Clark M.D.   On: 05/29/2019 11:32    Micro Results    Recent Results (from the past 240 hour(s))  SARS Coronavirus 2 (CEPHEID- Performed in [facility] hospital lab), Hosp Order     Status: Abnormal   Collection Time: 05/31/19  5:12 PM   Specimen: Nasopharyngeal Swab  Result Value Ref Range Status   SARS Coronavirus 2 POSITIVE (A) NEGATIVE Final    Comment: RESULT CALLED TO, READ BACK BY AND VERIFIED WITH: S.BINGHAM AT 1845 ON 05/31/19 BY N.THOMPSON (NOTE) If result is NEGATIVE SARS-CoV-2 target nucleic acids are NOT DETECTED. The SARS-CoV-2 RNA is generally detectable in upper and lower  respiratory specimens during the acute phase of infection. The lowest  concentration of SARS-CoV-2 viral copies this assay can detect is 250  copies / mL. A negative result does not preclude SARS-CoV-2 infection  and should not be used as the sole basis for treatment or other  patient management decisions.  A negative result may occur with  improper specimen collection / handling,  submission of specimen other  than nasopharyngeal swab, presence of viral mutation(s) within the  areas targeted by this assay, and  inadequate number of viral copies  (<250 copies / mL). A negative result must be combined with clinical  observations, patient history, and epidemiological information. If result is POSITIVE SARS-CoV-2 target nucleic acids are DETEC TED. The SARS-CoV-2 RNA is generally detectable in upper and lower  respiratory specimens during the acute phase of infection.  Positive  results are indicative of active infection with SARS-CoV-2.  Clinical  correlation with patient history and other diagnostic information is  necessary to determine patient infection status.  Positive results do  not rule out bacterial infection or co-infection with other viruses. If result is PRESUMPTIVE POSTIVE SARS-CoV-2 nucleic acids MAY BE PRESENT.   A presumptive positive result was obtained on the submitted specimen  and confirmed on repeat testing.  While 2019 novel coronavirus  (SARS-CoV-2) nucleic acids may be present in the submitted sample  additional confirmatory testing may be necessary for epidemiological  and / or clinical management purposes  to differentiate between  SARS-CoV-2 and other Sarbecovirus currently known to infect humans.  If clinically indicated additional testing with an alternate test  methodology (LAB7 453) is advised. The SARS-CoV-2 RNA is generally  detectable in upper and lower respiratory specimens during the acute  phase of infection. The expected result is Negative. Fact Sheet for Patients:  BoilerBrush.com.cyhttps://www.fda.gov/media/136312/download Fact Sheet for Healthcare Providers: https://pope.com/https://www.fda.gov/media/136313/download This test is not yet approved or cleared by the [place] FDA and has been authorized for detection and/or diagnosis of SARS-CoV-2 by FDA under an Emergency Use Authorization (EUA).  This EUA will remain in effect (meaning this test can be  used) for the duration of the COVID-19 declaration under Section 564(b)(1) of the Act, 21 U.S.C. section 360bbb-3(b)(1), unless the authorization is terminated or revoked sooner. Performed at [facility], [address] [place], [place] [id number]        Today   Subjective:   Cecil CobbsAnthony Northrop today has no headache,no chest or abdominal pain,no new weakness tingling or numbness, feels much better wants to go home today.  No dizziness or lightheadedness.  Objective:   Blood pressure 116/85, pulse 61, temperature 97.6 F (36.4 C), temperature source Oral, resp. rate (!) 25, height 5\' 9"  (1.753 m), weight 108.9 kg, SpO2 95 %.   Intake/Output Summary (Last 24 hours) at 06/01/2019 1158 Last data filed at 06/01/2019 0800 Gross per 24 hour  Intake 1240 ml  Output 500 ml  Net 740 ml    Exam Awake Alert, Oriented x 3, No new F.N deficits, Normal affect  Symmetrical Chest wall movement, Good air movement bilaterally, CTAB RRR,No Gallops,Rubs or new Murmurs, No Parasternal Heave +ve B.Sounds, Abd Soft, Non tender,  No rebound -guarding or rigidity. No Cyanosis, Clubbing or edema, No new Rash or bruise  Data Review   CBC w Diff:  Lab Results  Component Value Date   WBC 2.7 (L) 06/01/2019   HGB 13.7 06/01/2019   HCT 41.4 06/01/2019   PLT 165 06/01/2019   LYMPHOPCT 54 06/01/2019   MONOPCT 13 06/01/2019   EOSPCT 1 06/01/2019   BASOPCT 1 06/01/2019    CMP:  Lab Results  Component Value Date   NA 134 (L) 06/01/2019   K 3.4 (L) 06/01/2019   CL 102 06/01/2019   CO2 22 06/01/2019   BUN 17 06/01/2019   CREATININE 1.37 (H) 06/01/2019   PROT 7.1 06/01/2019   PROT 6.9 06/01/2019   ALBUMIN 3.0 (L) 06/01/2019   ALBUMIN 2.9 (L) 06/01/2019   BILITOT 0.5 06/01/2019  BILITOT 0.1 (L) 06/01/2019   ALKPHOS 76 06/01/2019   ALKPHOS 78 06/01/2019   AST 21 06/01/2019   AST 23 06/01/2019   ALT 20 06/01/2019   ALT 20 06/01/2019  .   Total Time in preparing  paper work, data evaluation and todays exam - 35 minutes  Huey Bienenstockawood Timouthy Gilardi M.D on 06/01/2019 at 11:58 AM  [facility]   Office  [phone]

## 2019-06-01 NOTE — Progress Notes (Signed)
Inpatient Diabetes Program Recommendations  AACE/ADA: New Consensus Statement on Inpatient Glycemic Control (2015)  Target Ranges:  Prepandial:   less than 140 mg/dL      Peak postprandial:   less than 180 mg/dL (1-2 hours)      Critically ill patients:  140 - 180 mg/dL   Lab Results  Component Value Date   GLUCAP 191 (H) 06/01/2019   HGBA1C (H) 05/12/2011    6.7 (NOTE)                                                                       According to the ADA Clinical Practice Recommendations for 2011, when HbA1c is used as a screening test:   >=6.5%   Diagnostic of Diabetes Mellitus           (if abnormal result  is confirmed)  5.7-6.4%   Increased risk of developing Diabetes Mellitus  References:Diagnosis and Classification of Diabetes Mellitus,Diabetes EPPI,9518,84(ZYSAY 1):S62-S69 and Standards of Medical Care in         Diabetes - 2011,Diabetes Care,2011,34  (Suppl 1):S11-S61.    Review of Glycemic Control  Diabetes history: DM2 Outpatient Diabetes medications: Novolin 70/30 20 units bid Current orders for Inpatient glycemic control: Novolog 0-9 units tidwc and 0-5 units QHS  Need updated HgbA1C.  Inpatient Diabetes Program Recommendations:    Consider Levemir 8 units bid Add Novolog 3 units tidwc for meal coverage insulin if pt eats > 50% meal Please order HgbA1C to assess glycemic control prior to admission.  Will follow glucose trends.  Thank you. Lorenda Peck, RD, LDN, CDE Inpatient Diabetes Coordinator [phone]

## 2019-06-02 LAB — HIV ANTIBODY (ROUTINE TESTING W REFLEX): HIV Screen 4th Generation wRfx: NONREACTIVE

## 2019-06-02 LAB — INTERLEUKIN-6, PLASMA: Interleukin-6, Plasma: 7.7 pg/mL (ref 0.0–12.2)

## 2019-06-02 LAB — HEPATITIS B SURFACE ANTIGEN: Hepatitis B Surface Ag: NEGATIVE

## 2019-06-03 LAB — GLUCOSE 6 PHOSPHATE DEHYDROGENASE
G6PDH: 7.1 U/g{Hb} (ref 4.6–13.5)
Hemoglobin: 13.8 g/dL (ref 13.0–17.7)

## 2019-06-09 NOTE — Discharge Summary (Signed)
Ryan [name], is a 58 y.o. male  DOB [birth year]  MRN [id number].  Admission date:  05/31/2019  Admitting Physician  Briscoe Deutscherimothy S Opyd, MD  Discharge Date:  06/01/2019   Primary MD  Lavinia SharpsPlacey, Mary Ann, NP   Patient was discharged on 6/19. Discharge summary was labelled mistakenly as progress note, this is discharge summary which was done 6/19, with appropriate labelling   Recommendations for primary care physician for things to follow:  - please check CBC, CMP during next visit   Admission Diagnosis  Syncope and collapse [R55] COVID-19 [U07.1, J98.8]   Discharge Diagnosis  Syncope and collapse [R55] COVID-19 [U07.1, J98.8]    Principal Problem:   COVID-19 virus infection Active Problems:   Essential hypertension   Orthostatic syncope   Insulin-requiring or dependent type II diabetes mellitus (HCC)   Drug abuse (HCC)   CKD (chronic kidney disease), stage III (HCC)      Past Medical History:  Diagnosis Date  . Achilles tendon rupture right  . Arthritis    back and shoulders  . Chest pain    due to cocaine use  . Diabetes mellitus   . Diabetes mellitus without complication (HCC)   . Drug abuse (HCC)    cocaine, marijuana abuse  . GERD (gastroesophageal reflux disease)   . Hypertension   . Renal insufficiency     Past Surgical History:  Procedure Laterality Date  . ACHILLES TENDON SURGERY Right 05/02/2014   Procedure: RIGHT ACHILLES TENDON REPAIR;  Surgeon: Javier DockerJeffrey C Beane, MD;  Location: [facility];  Service: Orthopedics;  Laterality: Right;  . CARDIAC CATHETERIZATION  05.31.2012   showing patent coronaries with no visualized vasospasm and EF of 55-60 %  . ORBITAL FRACTURE SURGERY Left 2-3 yrs ago       History of present illness and  Hospital Course:     Kindly see H&P for history of present illness and admission details, please review complete Labs, Consult reports and Test  reports for all details in brief  HPI  from the history and physical done on the day of admission 05/31/2019  HPI: Ryan [name] is a 58 y.o. male with medical history significant for insulin-dependent diabetes mellitus, polysubstance abuse, hypertension, and chronic kidney disease stage III, now presenting to the emergency department after a syncopal episode.  Patient reports that he developed a nonspecific malaise with generalized weakness, mild headache, and loss of appetite approximately 4 days ago.  Generalized weakness and fatigue has progressively worsened over this interval and the patient has become lightheaded upon standing.  Today while at work, he became acutely lightheaded while standing up and suffered a brief syncopal episode.  He denies any associated chest pain or palpitations.  He denies any recent travel, sick contacts, fevers, or chills.  He denies any significant cough, reports some mild exertional dyspnea.  ED Course: Upon arrival to the ED, patient is found to be afebrile, saturating low 90s on room air, mildly tachypneic, and with blood pressure 106 systolic.  EKG features a sinus  rhythm.  Noncontrast head CT is negative for acute intracranial abnormality.  Chemistry panel is notable for a creatinine 1.71, similar to priors.  CBC features a leukopenia with WBC 3900.  D-dimer was elevated to 1.26 and CTA chest, while negative for PE, demonstrates bilateral groundglass opacities concerning for atypical infection.  Patient was given 2 L normal saline in the emergency department and hospitalists are asked to admit.   Hospital Course   COVID-19 infection  - Presents with 4 days of progressive fatigue, malaise, lightheadedness on standing, and then a syncopal episode today  - CTA was negative for PE but notable for bilateral ground-glass densities in a pattern concerning for atypical infection and COVID-19 swab is positive  - H and ferritin within normal limit, CRP mildly  elevated, which is reassuring, he denies any chest pain, shortness of breath which is reassuring as well, for now no indication for any treatment .  Syncope, orthostasis  - Presents with a syncopal episode after a couple days of progressive fatigue and lightheadedness on standing  - There is a 20 mmHg drop in SBP from sitting to standing  - No chest pain, unremarkable cardiac exam, and no concerning features on EKG , he has negative troponins - Further orthostatic this morning, ambulating with no dizziness or lightheadedness -I have discontinued his Coreg given cocaine use, I have instructed him to hold his Norvasc, lisinopril, chlorothiazide and resume in 1 week.  Insulin-dependent DM  - No recent A1c on file  - Use SSI with Novolog while in hospital    CKD III  - SCr is 1.71 on admission, similar to priors , it is 1.37 on discharge - Renally-dose medications    Hypertension  - Prostatic with soft blood pressure on presentation, blood pressure is acceptable currently, continue Coreg in the setting of cocaine use, resume rest of medication in 1 week.  cocaine abuse -He was counseled   Discharge Condition:  stable   Follow UP  Follow-up Information    Placey, Chales AbrahamsMary Ann, NP Follow up in 1 week(s).   Contact information: [address] [place] [address] [phone]             Discharge Instructions  and  Discharge Medications     Discharge Instructions    MyChart COVID-19 home monitoring program   Complete by: Jun 01, 2019    Is the patient willing to use the MyChart Mobile App for home monitoring?: Yes   Temperature monitoring   Complete by: Jun 01, 2019    After how many days would you like to receive a notification of this patient's flowsheet entries?: 1   Discharge instructions   Complete by: As directed    Follow with Primary MD Placey, Chales AbrahamsMary Ann, NP in 7 days   Get CBC, CMP,  checked  by Primary MD next visit.    Activity: As tolerated with  Full fall precautions use walker/cane & assistance as needed   Disposition Home    Diet: Heart Healthy .  For Heart failure patients - Check your Weight same time everyday, if you gain over 2 pounds, or you develop in leg swelling, experience more shortness of breath or chest pain, call your Primary MD immediately. Follow Cardiac Low Salt Diet and 1.5 lit/day fluid restriction.   On your next visit with your primary care physician please Get Medicines reviewed and adjusted.   Please request your Prim.MD to go over all Hospital Tests and Procedure/Radiological results at the follow  up, please get all Hospital records sent to your Prim MD by signing hospital release before you go home.   If you experience worsening of your admission symptoms, develop shortness of breath, life threatening emergency, suicidal or homicidal thoughts you must seek medical attention immediately by calling 911 or calling your MD immediately  if symptoms less severe.  You Must read complete instructions/literature along with all the possible adverse reactions/side effects for all the Medicines you take and that have been prescribed to you. Take any new Medicines after you have completely understood and accpet all the possible adverse reactions/side effects.   Do not drive, operating heavy machinery, perform activities at heights, swimming or participation in water activities or provide baby sitting services if your were admitted for syncope or siezures until you have seen by Primary MD or a Neurologist and advised to do so again.  Do not drive when taking Pain medications.    Do not take more than prescribed Pain, Sleep and Anxiety Medications  Special Instructions: If you have smoked or chewed Tobacco  in the last 2 yrs please stop smoking, stop any regular Alcohol  and or any Recreational drug use.  Wear Seat belts while driving.   Please note  You were cared for by a hospitalist during your hospital stay.  If you have any questions about your discharge medications or the care you received while you were in the hospital after you are discharged, you can call the unit and asked to speak with the hospitalist on call if the hospitalist that took care of you is not available. Once you are discharged, your primary care physician will handle any further medical issues. Please note that NO REFILLS for any discharge medications will be authorized once you are discharged, as it is imperative that you return to your primary care physician (or establish a relationship with a primary care physician if you do not have one) for your aftercare needs so that they can reassess your need for medications and monitor your lab values.   Increase activity slowly   Complete by: As directed      Allergies as of 06/01/2019   No Known Allergies     Medication List    STOP taking these medications   carvedilol 25 MG tablet Commonly known as: COREG   furosemide 20 MG tablet Commonly known as: LASIX     TAKE these medications   amLODipine 10 MG tablet Commonly known as: NORVASC Take 1 tablet (10 mg total) by mouth every morning. Resume in 1 week What changed: additional instructions   famotidine 20 MG tablet Commonly known as: PEPCID Take 1 tablet (20 mg total) by mouth 2 (two) times daily for 5 days.   insulin NPH-regular Human (70-30) 100 UNIT/ML injection Commonly known as: NovoLIN 70/30 Take a directed. What changed:   how much to take  how to take this  when to take this   lisinopril-hydrochlorothiazide 20-25 MG tablet Commonly known as: ZESTORETIC Take 1 tablet by mouth every morning. Please resume in 1 week What changed: additional instructions   pantoprazole 20 MG tablet Commonly known as: PROTONIX Take 1 tablet (20 mg total) by mouth 2 (two) times daily.   Potassium Chloride ER 20 MEQ Tbcr Take 20 mEq by mouth daily.         Diet and Activity recommendation: See Discharge  Instructions above   Consults obtained - None   Major procedures and Radiology Reports - PLEASE review detailed and final  reports for all details, in brief -     Ct Head Wo Contrast  Result Date: 05/31/2019 CLINICAL DATA:  Tired and weak x3 days.  Head trauma. EXAM: CT HEAD WITHOUT CONTRAST TECHNIQUE: Contiguous axial images were obtained from the base of the skull through the vertex without intravenous contrast. COMPARISON:  No recent relevant CT available for comparison. FINDINGS: Brain: No evidence of acute infarction, hemorrhage, hydrocephalus, extra-axial collection or mass lesion/mass effect. Vascular: No hyperdense vessel or unexpected calcification. Skull: Normal. Negative for fracture or focal lesion. The patient is status post remote repair of the left frontal bone. There is an old defect involving the left lamina papyracea. Sinuses/Orbits: No acute finding. Other: None. IMPRESSION: No acute intracranial abnormality. Electronically Signed   By: Katherine Mantlehristopher  Green M.D.   On: 05/31/2019 13:26   Ct Angio Chest Pe W And/or Wo Contrast  Result Date: 05/31/2019 CLINICAL DATA:  PE suspected, syncope EXAM: CT ANGIOGRAPHY CHEST WITH CONTRAST TECHNIQUE: Multidetector CT imaging of the chest was performed using the standard protocol during bolus administration of intravenous contrast. Multiplanar CT image reconstructions and MIPs were obtained to evaluate the vascular anatomy. CONTRAST:  100mL OMNIPAQUE IOHEXOL 350 MG/ML SOLN COMPARISON:  02/26/2018 FINDINGS: Cardiovascular: Satisfactory opacification of the pulmonary arteries to the segmental level. No evidence of pulmonary embolism. Cardiomegaly. No pericardial effusion. Mediastinum/Nodes: No enlarged mediastinal, hilar, or axillary lymph nodes. Hiatal hernia. Thyroid gland, trachea, and esophagus demonstrate no significant findings. Lungs/Pleura: There is extensive bilateral, predominantly subpleural ground-glass and heterogeneous pulmonary  opacity. There are stable, benign small pulmonary nodules of the right upper and middle lobes (series 11, image 62, 59). No pleural effusion or pneumothorax. Upper Abdomen: No acute abnormality. Musculoskeletal: No chest wall abnormality. No acute or significant osseous findings. Review of the MIP images confirms the above findings. IMPRESSION: 1.  Negative examination for pulmonary embolism. 2. There is extensive bilateral, predominantly subpleural ground-glass and heterogeneous pulmonary opacity. There are a spectrum of findings in the lungs which can be seen with acute atypical infection (as well as other non-infectious etiologies). In particular, viral pneumonia (including COVID-19) should be considered in the appropriate clinical setting. Electronically Signed   By: Lauralyn PrimesAlex  Bibbey M.D.   On: 05/31/2019 14:47   Dg Chest Port 1 View  Result Date: 05/29/2019 CLINICAL DATA:  Cough and weakness.  History of lung nodule EXAM: PORTABLE CHEST 1 VIEW COMPARISON:  Chest two-view 02/19/2019 FINDINGS: Heart size mildly enlarged. Negative for heart failure. Decreased lung volume with mild atelectasis in the bases. Negative for consolidation or effusion. 5 mm nodule along the minor fissure on the right is noted on prior CT of 02/26/2018 and not visualized on the current study. IMPRESSION: Hypoventilation with mild bibasilar atelectasis. Electronically Signed   By: Marlan Palauharles  Clark M.D.   On: 05/29/2019 11:32    Micro Results    Recent Results (from the past 240 hour(s))  SARS Coronavirus 2 (CEPHEID- [facility] Hosp Order     Status: Abnormal   Collection Time: 05/31/19  5:12 PM   Specimen: Nasopharyngeal Swab  Result Value Ref Range Status   SARS Coronavirus 2 POSITIVE (A) NEGATIVE Final    Comment: RESULT CALLED TO, READ BACK BY AND VERIFIED WITH: S.BINGHAM AT 1845 ON 05/31/19 BY N.THOMPSON (NOTE) If result is NEGATIVE SARS-CoV-2 target nucleic acids are NOT DETECTED. The  SARS-CoV-2 RNA is generally detectable in upper and lower  respiratory specimens during the acute phase of infection. The lowest  concentration of  SARS-CoV-2 viral copies this assay can detect is 250  copies / mL. A negative result does not preclude SARS-CoV-2 infection  and should not be used as the sole basis for treatment or other  patient management decisions.  A negative result may occur with  improper specimen collection / handling, submission of specimen other  than nasopharyngeal swab, presence of viral mutation(s) within the  areas targeted by this assay, and inadequate number of viral copies  (<250 copies / mL). A negative result must be combined with clinical  observations, patient history, and epidemiological information. If result is POSITIVE SARS-CoV-2 target nucleic acids are DETEC TED. The SARS-CoV-2 RNA is generally detectable in upper and lower  respiratory specimens during the acute phase of infection.  Positive  results are indicative of active infection with SARS-CoV-2.  Clinical  correlation with patient history and other diagnostic information is  necessary to determine patient infection status.  Positive results do  not rule out bacterial infection or co-infection with other viruses. If result is PRESUMPTIVE POSTIVE SARS-CoV-2 nucleic acids MAY BE PRESENT.   A presumptive positive result was obtained on the submitted specimen  and confirmed on repeat testing.  While 2019 novel coronavirus  (SARS-CoV-2) nucleic acids may be present in the submitted sample  additional confirmatory testing may be necessary for epidemiological  and / or clinical management purposes  to differentiate between  SARS-CoV-2 and other Sarbecovirus currently known to infect humans.  If clinically indicated additional testing with an alternate test  methodology (LAB7 453) is advised. The SARS-CoV-2 RNA is generally  detectable in upper and lower respiratory specimens during the acute   phase of infection. The expected result is Negative. Fact Sheet for Patients:  BoilerBrush.com.cy Fact Sheet for Healthcare Providers: https://pope.com/ This test is not yet approved or cleared by the [place] FDA and has been authorized for detection and/or diagnosis of SARS-CoV-2 by FDA under an Emergency Use Authorization (EUA).  This EUA will remain in effect (meaning this test can be used) for the duration of the COVID-19 declaration under Section 564(b)(1) of the Act, 21 U.S.C. section 360bbb-3(b)(1), unless the authorization is terminated or revoked sooner. Performed at [facility], [address], [place], [place] [address]        Today   Subjective:   Ryan Cobbs today has no headache,no chest or abdominal pain,no new weakness tingling or numbness, feels much better wants to go home today.  No dizziness or lightheadedness.  Objective:   Blood pressure (!) 120/91, pulse 70, temperature 97.6 F (36.4 C), temperature source Oral, resp. rate (!) 22, height  (1.753 m), weight 108.9 kg, SpO2 98 %.  No intake or output data in the 24 hours ending 06/09/19 1510  Exam Awake Alert, Oriented x 3, No new F.N deficits, Normal affect  Symmetrical Chest wall movement, Good air movement bilaterally, CTAB RRR,No Gallops,Rubs or new Murmurs, No Parasternal Heave +ve B.Sounds, Abd Soft, Non tender,  No rebound -guarding or rigidity. No Cyanosis, Clubbing or edema, No new Rash or bruise  Data Review   CBC w Diff:  Lab Results  Component Value Date   WBC 2.7 (L) 06/01/2019   HGB 13.8 06/01/2019   HGB 13.7 06/01/2019   HCT 41.4 06/01/2019   PLT 165 06/01/2019   LYMPHOPCT 54 06/01/2019   MONOPCT 13 06/01/2019   EOSPCT 1 06/01/2019   BASOPCT 1 06/01/2019    CMP:  Lab Results  Component Value Date   NA 134 (  L) 06/01/2019   K 3.4 (L) 06/01/2019   CL 102 06/01/2019   CO2 22 06/01/2019   BUN  17 06/01/2019   CREATININE 1.37 (H) 06/01/2019   PROT 7.1 06/01/2019   PROT 6.9 06/01/2019   ALBUMIN 3.0 (L) 06/01/2019   ALBUMIN 2.9 (L) 06/01/2019   BILITOT 0.5 06/01/2019   BILITOT 0.1 (L) 06/01/2019   ALKPHOS 76 06/01/2019   ALKPHOS 78 06/01/2019   AST 21 06/01/2019   AST 23 06/01/2019   ALT 20 06/01/2019   ALT 20 06/01/2019  .   Total Time in preparing paper work, data evaluation and todays exam - 35 minutes  Huey Bienenstock M.D on 06/09/2019 at 3:10 PM(update for discharge summary  done 06/01/2019 at 11:58am) [facility]   Office  [phone]

## 2019-07-11 ENCOUNTER — Other Ambulatory Visit: Payer: Self-pay

## 2019-07-11 ENCOUNTER — Emergency Department (HOSPITAL_COMMUNITY)
Admission: EM | Admit: 2019-07-11 | Discharge: 2019-07-12 | Disposition: A | Discharge status: Home / Self Care | Payer: Self-pay | Attending: Emergency Medicine | Admitting: Emergency Medicine

## 2019-07-11 ENCOUNTER — Encounter (HOSPITAL_COMMUNITY): Payer: Self-pay | Admitting: Emergency Medicine

## 2019-07-11 DIAGNOSIS — R109 Unspecified abdominal pain: Secondary | ICD-10-CM | POA: Insufficient documentation

## 2019-07-11 DIAGNOSIS — Z5321 Procedure and treatment not carried out due to patient leaving prior to being seen by health care provider: Secondary | ICD-10-CM | POA: Insufficient documentation

## 2019-07-11 LAB — COMPREHENSIVE METABOLIC PANEL
ALT: 16 U/L (ref 0–44)
AST: 23 U/L (ref 15–41)
Albumin: 3.8 g/dL (ref 3.5–5.0)
Alkaline Phosphatase: 67 U/L (ref 38–126)
Anion gap: 10 (ref 5–15)
BUN: 15 mg/dL (ref 6–20)
CO2: 23 mmol/L (ref 22–32)
Calcium: 8.8 mg/dL — ABNORMAL LOW (ref 8.9–10.3)
Chloride: 104 mmol/L (ref 98–111)
Creatinine, Ser: 1.39 mg/dL — ABNORMAL HIGH (ref 0.61–1.24)
GFR calc Af Amer: >60 mL/min (ref >60)
GFR calc non Af Amer: 57 mL/min — ABNORMAL LOW (ref >60)
Glucose, Bld: 127 mg/dL — ABNORMAL HIGH (ref 70–99)
Potassium: 3.4 mmol/L — ABNORMAL LOW (ref 3.5–5.1)
Sodium: 137 mmol/L (ref 135–145)
Total Bilirubin: 0.6 mg/dL (ref 0.3–1.2)
Total Protein: 7.6 g/dL (ref 6.5–8.1)

## 2019-07-11 LAB — CBC
HCT: 40.5 % (ref 39.0–52.0)
Hemoglobin: 13.4 g/dL (ref 13.0–17.0)
MCH: 29.6 pg (ref 26.0–34.0)
MCHC: 33.1 g/dL (ref 30.0–36.0)
MCV: 89.4 fL (ref 80.0–100.0)
Platelets: 195 10*3/uL (ref 150–400)
RBC: 4.53 MIL/uL (ref 4.22–5.81)
RDW: 15 % (ref 11.5–15.5)
WBC: 6.4 10*3/uL (ref 4.0–10.5)
nRBC: 0 % (ref 0.0–0.2)

## 2019-07-11 LAB — LIPASE, BLOOD: Lipase: 28 U/L (ref 11–51)

## 2019-07-11 NOTE — ED Triage Notes (Signed)
Pt reports that supposed to have abd procedure with [facility] but was cancelled and told if abd pains persist to go to the ED. Pt c/o intermittent abd pains without urinary, bowel, or n/v problems. Also c/o bilat ankle swelling for several days. Denies falls or injuries.

## 2019-07-11 NOTE — ED Notes (Signed)
Roll call for pt status, no answer. ENMiles 

## 2019-07-12 ENCOUNTER — Encounter (HOSPITAL_COMMUNITY): Payer: Self-pay

## 2019-07-12 ENCOUNTER — Other Ambulatory Visit: Payer: Self-pay

## 2019-07-12 ENCOUNTER — Emergency Department (HOSPITAL_COMMUNITY)
Admission: EM | Admit: 2019-07-12 | Discharge: 2019-07-13 | Disposition: A | Discharge status: Home / Self Care | Payer: Self-pay | Attending: Emergency Medicine | Admitting: Emergency Medicine

## 2019-07-12 DIAGNOSIS — M7989 Other specified soft tissue disorders: Secondary | ICD-10-CM

## 2019-07-12 DIAGNOSIS — I129 Hypertensive chronic kidney disease with stage 1 through stage 4 chronic kidney disease, or unspecified chronic kidney disease: Secondary | ICD-10-CM | POA: Insufficient documentation

## 2019-07-12 DIAGNOSIS — F1410 Cocaine abuse, uncomplicated: Secondary | ICD-10-CM | POA: Insufficient documentation

## 2019-07-12 DIAGNOSIS — E1122 Type 2 diabetes mellitus with diabetic chronic kidney disease: Secondary | ICD-10-CM | POA: Insufficient documentation

## 2019-07-12 DIAGNOSIS — R1032 Left lower quadrant pain: Secondary | ICD-10-CM | POA: Insufficient documentation

## 2019-07-12 DIAGNOSIS — F1210 Cannabis abuse, uncomplicated: Secondary | ICD-10-CM | POA: Insufficient documentation

## 2019-07-12 DIAGNOSIS — R2243 Localized swelling, mass and lump, lower limb, bilateral: Secondary | ICD-10-CM | POA: Insufficient documentation

## 2019-07-12 DIAGNOSIS — N183 Chronic kidney disease, stage 3 (moderate): Secondary | ICD-10-CM | POA: Insufficient documentation

## 2019-07-12 LAB — CBC
HCT: 39.7 % (ref 39.0–52.0)
Hemoglobin: 13.5 g/dL (ref 13.0–17.0)
MCH: 29.8 pg (ref 26.0–34.0)
MCHC: 34 g/dL (ref 30.0–36.0)
MCV: 87.6 fL (ref 80.0–100.0)
Platelets: 199 10*3/uL (ref 150–400)
RBC: 4.53 MIL/uL (ref 4.22–5.81)
RDW: 14.8 % (ref 11.5–15.5)
WBC: 5.9 10*3/uL (ref 4.0–10.5)
nRBC: 0 % (ref 0.0–0.2)

## 2019-07-12 LAB — COMPREHENSIVE METABOLIC PANEL
ALT: 17 U/L (ref 0–44)
AST: 24 U/L (ref 15–41)
Albumin: 3.5 g/dL (ref 3.5–5.0)
Alkaline Phosphatase: 75 U/L (ref 38–126)
Anion gap: 11 (ref 5–15)
BUN: 13 mg/dL (ref 6–20)
CO2: 22 mmol/L (ref 22–32)
Calcium: 8.7 mg/dL — ABNORMAL LOW (ref 8.9–10.3)
Chloride: 102 mmol/L (ref 98–111)
Creatinine, Ser: 1.53 mg/dL — ABNORMAL HIGH (ref 0.61–1.24)
GFR calc Af Amer: 58 mL/min — ABNORMAL LOW (ref >60)
GFR calc non Af Amer: 50 mL/min — ABNORMAL LOW (ref >60)
Glucose, Bld: 228 mg/dL — ABNORMAL HIGH (ref 70–99)
Potassium: 3.3 mmol/L — ABNORMAL LOW (ref 3.5–5.1)
Sodium: 135 mmol/L (ref 135–145)
Total Bilirubin: 0.7 mg/dL (ref 0.3–1.2)
Total Protein: 6.9 g/dL (ref 6.5–8.1)

## 2019-07-12 LAB — URINALYSIS, ROUTINE W REFLEX MICROSCOPIC
Bilirubin Urine: NEGATIVE
Glucose, UA: 150 mg/dL — AB
Hgb urine dipstick: NEGATIVE
Ketones, ur: NEGATIVE mg/dL
Leukocytes,Ua: NEGATIVE
Nitrite: NEGATIVE
Protein, ur: NEGATIVE mg/dL
Specific Gravity, Urine: 1.023 (ref 1.005–1.030)
pH: 6 (ref 5.0–8.0)

## 2019-07-12 LAB — LIPASE, BLOOD: Lipase: 29 U/L (ref 11–51)

## 2019-07-12 MED ORDER — SODIUM CHLORIDE 0.9% FLUSH
3.0000 mL | Freq: Once | INTRAVENOUS | Status: AC
  Administered 2019-07-13: 3 mL via INTRAVENOUS

## 2019-07-12 NOTE — ED Triage Notes (Signed)
Pt reports lower abd pain and bilateral leg swelling. Pt has a procedure scheduled at [facility] but was cancelled, pt doesn't know what the procedure was.

## 2019-07-13 ENCOUNTER — Ambulatory Visit (HOSPITAL_BASED_OUTPATIENT_CLINIC_OR_DEPARTMENT_OTHER)
Admission: RE | Admit: 2019-07-13 | Discharge: 2019-07-13 | Disposition: A | Discharge status: Home / Self Care | Payer: Self-pay | Source: Ambulatory Visit | Attending: Emergency Medicine | Admitting: Emergency Medicine

## 2019-07-13 ENCOUNTER — Ambulatory Visit (HOSPITAL_COMMUNITY): Payer: Self-pay

## 2019-07-13 ENCOUNTER — Emergency Department (HOSPITAL_COMMUNITY): Payer: Self-pay

## 2019-07-13 DIAGNOSIS — M7989 Other specified soft tissue disorders: Secondary | ICD-10-CM

## 2019-07-13 MED ORDER — FENTANYL CITRATE (PF) 100 MCG/2ML IJ SOLN
50.0000 ug | Freq: Once | INTRAMUSCULAR | Status: AC
  Administered 2019-07-13: 01:00:00 50 ug via INTRAVENOUS
  Filled 2019-07-13: qty 2

## 2019-07-13 MED ORDER — POTASSIUM CHLORIDE CRYS ER 20 MEQ PO TBCR
40.0000 meq | EXTENDED_RELEASE_TABLET | Freq: Once | ORAL | Status: AC
  Administered 2019-07-13: 05:00:00 40 meq via ORAL
  Filled 2019-07-13: qty 2

## 2019-07-13 MED ORDER — ENOXAPARIN SODIUM 120 MG/0.8ML ~~LOC~~ SOLN
1.0000 mg/kg | Freq: Once | SUBCUTANEOUS | Status: AC
  Administered 2019-07-13: 105 mg via SUBCUTANEOUS
  Filled 2019-07-13: qty 0.7

## 2019-07-13 MED ORDER — IOHEXOL 300 MG/ML  SOLN
100.0000 mL | Freq: Once | INTRAMUSCULAR | Status: AC | PRN
  Administered 2019-07-13: 100 mL via INTRAVENOUS

## 2019-07-13 MED ORDER — HYDROMORPHONE HCL 1 MG/ML IJ SOLN
1.0000 mg | Freq: Once | INTRAMUSCULAR | Status: AC
  Administered 2019-07-13: 05:00:00 1 mg via INTRAVENOUS
  Filled 2019-07-13: qty 1

## 2019-07-13 NOTE — ED Provider Notes (Signed)
Emergency Department Provider Note   I have reviewed the triage vital signs and the nursing notes.   HISTORY  Chief Complaint Abdominal Pain and Leg Swelling   HPI Ryan [name] is a 58 y.o. male who presents the emergency department today with lower abdominal pain and extremity swelling.  Patient states that he has abdominal pain every couple months and has been for 2 to 3 days time is very severe and is always in the lower quadrants usually on the left.  He has no associated constipation or nausea or diarrhea.  Has no fevers with it.  He was supposed to get a colonoscopy but it sounds like they are delaying that secondary to coronavirus.  Patient states that this pain came back yesterday and is still there now but not as bad as it was yesterday.  He also states that he has lower extremity swelling.  He states that both of his legs are swollen but the right is greater than the left.  He states is been going on for 2 or 3 days.  After examining him and noticed in the right leg is much bigger than the left he agrees but still thinks his left ankle is slightly swollen.  No fevers or cough.  No shortness of breath.  No chest pain.   No other associated or modifying symptoms.    Past Medical History:  Diagnosis Date  . Achilles tendon rupture right  . Arthritis    back and shoulders  . Chest pain    due to cocaine use  . Diabetes mellitus   . Diabetes mellitus without complication (HCC)   . Drug abuse (HCC)    cocaine, marijuana abuse  . GERD (gastroesophageal reflux disease)   . Hypertension   . Renal insufficiency     Patient Active Problem List   Diagnosis Date Noted  . COVID-19 virus infection 05/31/2019  . Orthostatic syncope 05/31/2019  . Insulin-requiring or dependent type II diabetes mellitus (HCC) 05/31/2019  . Drug abuse (HCC)   . CKD (chronic kidney disease), stage III (HCC)   . Right ankle pain 05/15/2014  . Need for Tdap vaccination 05/15/2014  . Essential  hypertension 05/14/2014  . Diabetes mellitus (HCC) 05/14/2014  . Rupture of right Achilles tendon 05/02/2014  . Tendon rupture, Achilles 05/02/2014    Past Surgical History:  Procedure Laterality Date  . ACHILLES TENDON SURGERY Right 05/02/2014   Procedure: RIGHT ACHILLES TENDON REPAIR;  Surgeon: Javier DockerJeffrey C Beane, MD;  Location: [facility];  Service: Orthopedics;  Laterality: Right;  . CARDIAC CATHETERIZATION  05.31.2012   showing patent coronaries with no visualized vasospasm and EF of 55-60 %  . ORBITAL FRACTURE SURGERY Left 2-3 yrs ago    Current Outpatient Rx  . Order #: [id number] Class: No Print  . Order #: [id number] Class: Normal  . Order #: [id number] Class: Normal  . Order #: [id number] Class: No Print  . Order #: [id number] Class: Print  . Order #: [id number] Class: Print    Allergies Patient has no known allergies.  No family history on file.  Social History Social History   Tobacco Use  . Smoking status: Never Smoker  . Smokeless tobacco: Never Used  Substance Use Topics  . Alcohol use: Yes    Comment: occassional  . Drug use: Yes    Types: Cocaine, Marijuana    Comment: last cocaine use 2 to 3 yrs ago, last marijuana use 4 to 5 years ago    Review of Systems  All other systems  negative except as documented in the HPI. All pertinent positives and negatives as reviewed in the HPI. ____________________________________________   PHYSICAL EXAM:  VITAL SIGNS: ED Triage Vitals  Enc Vitals Group     BP 07/12/19 1629 139/81     Pulse Rate 07/12/19 1629 83     Resp 07/12/19 1629 16     Temp 07/12/19 1629 98.2 F (36.8 C)     Temp Source 07/12/19 1629 Oral     SpO2 07/12/19 1629 98 %     Weight 07/13/19 0026 230 lb (104.3 kg)     Height 07/13/19 0026 5\' 9"  (1.753 m)    Constitutional: Alert and oriented. Well appearing and in no acute distress. Eyes: Conjunctivae are normal. PERRL. EOMI. Head: Atraumatic. Nose: No congestion/rhinnorhea. Mouth/Throat: Mucous  membranes are moist.  Oropharynx non-erythematous. Neck: No stridor.  No meningeal signs.   Cardiovascular: Normal rate, regular rhythm. Good peripheral circulation. Grossly normal heart sounds.   Respiratory: Normal respiratory effort.  No retractions. Lungs CTAB. Gastrointestinal: Soft and TTP in L>R LQ. No distention.  Musculoskeletal: No lower extremity tenderness but has significant RLE edema to knee. No gross deformities of extremities. Neurologic:  Normal speech and language. No gross focal neurologic deficits are appreciated.  Skin:  Skin is warm, dry and intact. No rash noted.  ____________________________________________   LABS (all labs ordered are listed, but only abnormal results are displayed)  Labs Reviewed  COMPREHENSIVE METABOLIC PANEL - Abnormal; Notable for the following components:      Result Value   Potassium 3.3 (*)    Glucose, Bld 228 (*)    Creatinine, Ser 1.53 (*)    Calcium 8.7 (*)    GFR calc non Af Amer 50 (*)    GFR calc Af Amer 58 (*)    All other components within normal limits  URINALYSIS, ROUTINE W REFLEX MICROSCOPIC - Abnormal; Notable for the following components:   Glucose, UA 150 (*)    All other components within normal limits  LIPASE, BLOOD  CBC   ____________________________________________  EKG   EKG Interpretation  Date/Time:    Ventricular Rate:    PR Interval:    QRS Duration:   QT Interval:    QTC Calculation:   R Axis:     Text Interpretation:         ____________________________________________  RADIOLOGY  Ct Abdomen Pelvis W Contrast  Result Date: 07/13/2019 CLINICAL DATA:  Lower abdominal pain, bilateral leg swelling EXAM: CT ABDOMEN AND PELVIS WITH CONTRAST TECHNIQUE: Multidetector CT imaging of the abdomen and pelvis was performed using the standard protocol following bolus administration of intravenous contrast. CONTRAST:  170mL OMNIPAQUE IOHEXOL 300 MG/ML  SOLN COMPARISON:  05/01/2019 FINDINGS: Lower chest:  Mild dependent atelectasis in the bilateral lower lobes. Hepatobiliary: Liver is within normal limits. Gallbladder is unremarkable. No intrahepatic or extrahepatic ductal dilatation. Pancreas: Within normal limits. Spleen: Within normal limits. Adrenals/Urinary Tract: Adrenal glands are within normal limits. Kidneys are within normal limits.  No hydronephrosis. Bladder is within normal limits. Stomach/Bowel: Stomach is notable for a moderate hiatal hernia. No evidence of bowel obstruction. Normal appendix (series 3/image 58). Scattered mild colonic diverticulosis, without evidence of diverticulitis. Vascular/Lymphatic: No evidence of abdominal aortic aneurysm. Atherosclerotic calcifications of the abdominal aorta and branch vessels. Reproductive: Prostate is unremarkable. Other: No abdominopelvic ascites. Tiny fat containing periumbilical hernia (series 3/image 55). Musculoskeletal: Degenerative changes of the visualized thoracolumbar spine. IMPRESSION: No CT findings to account for the patient's lower abdominal pain. No  evidence of bowel obstruction.  Normal appendix. Electronically Signed   By: Charline BillsSriyesh  Krishnan M.D.   On: 07/13/2019 03:36    ____________________________________________   PROCEDURES  Procedure(s) performed:   Procedures   ____________________________________________   INITIAL IMPRESSION / ASSESSMENT AND PLAN / ED COURSE  R/o diverticulitis with CT scan.   Will need to come back tomorrow to eval for DVT in right leg. Doubt HF at this time. No e/o PE on exam/ROS  Workup unremarkable. lovenox given, will return for DVT study. Pain controlled. PO ok.      Pertinent labs & imaging results that were available during my care of the patient were reviewed by me and considered in my medical decision making (see chart for details).  A medical screening exam was performed and I feel the patient has had an appropriate workup for their chief complaint at this time and likelihood of  emergent condition existing is low. They have been counseled on decision, discharge, follow up and which symptoms necessitate immediate return to the emergency department. They or their family verbally stated understanding and agreement with plan and discharged in stable condition.   ____________________________________________  FINAL CLINICAL IMPRESSION(S) / ED DIAGNOSES  Final diagnoses:  Leg swelling     MEDICATIONS GIVEN DURING THIS VISIT:  Medications  enoxaparin (LOVENOX) injection 105 mg (has no administration in time range)  sodium chloride flush (NS) 0.9 % injection 3 mL (3 mLs Intravenous Given 07/13/19 0026)  fentaNYL (SUBLIMAZE) injection 50 mcg (50 mcg Intravenous Given 07/13/19 0105)  iohexol (OMNIPAQUE) 300 MG/ML solution 100 mL (100 mLs Intravenous Contrast Given 07/13/19 0308)  HYDROmorphone (DILAUDID) injection 1 mg (1 mg Intravenous Given 07/13/19 0439)  potassium chloride SA (K-DUR) CR tablet 40 mEq (40 mEq Oral Given 07/13/19 0439)     NEW OUTPATIENT MEDICATIONS STARTED DURING THIS VISIT:  New Prescriptions   No medications on file    Note:  This note was prepared with assistance of Dragon voice recognition software. Occasional wrong-word or sound-a-like substitutions may have occurred due to the inherent limitations of voice recognition software.   Finesse Fielder, Barbara CowerJason, MD 07/13/19 [phone]

## 2019-07-13 NOTE — Progress Notes (Signed)
Right lower extremity venous duplex completed. Refer to "CV Proc" under chart review to view preliminary results.  07/13/2019 4:35 PM Maudry Mayhew, MHA, RVT, RDCS, RDMS

## 2019-09-19 ENCOUNTER — Other Ambulatory Visit: Payer: Self-pay

## 2019-09-19 ENCOUNTER — Emergency Department (HOSPITAL_COMMUNITY)
Admission: EM | Admit: 2019-09-19 | Discharge: 2019-09-19 | Disposition: A | Discharge status: Home / Self Care | Payer: Self-pay | Attending: Emergency Medicine | Admitting: Emergency Medicine

## 2019-09-19 ENCOUNTER — Encounter (HOSPITAL_COMMUNITY): Payer: Self-pay

## 2019-09-19 DIAGNOSIS — Y939 Activity, unspecified: Secondary | ICD-10-CM | POA: Insufficient documentation

## 2019-09-19 DIAGNOSIS — I129 Hypertensive chronic kidney disease with stage 1 through stage 4 chronic kidney disease, or unspecified chronic kidney disease: Secondary | ICD-10-CM | POA: Insufficient documentation

## 2019-09-19 DIAGNOSIS — W260XXA Contact with knife, initial encounter: Secondary | ICD-10-CM | POA: Insufficient documentation

## 2019-09-19 DIAGNOSIS — N183 Chronic kidney disease, stage 3 unspecified: Secondary | ICD-10-CM | POA: Insufficient documentation

## 2019-09-19 DIAGNOSIS — Z23 Encounter for immunization: Secondary | ICD-10-CM | POA: Insufficient documentation

## 2019-09-19 DIAGNOSIS — S61412A Laceration without foreign body of left hand, initial encounter: Secondary | ICD-10-CM | POA: Insufficient documentation

## 2019-09-19 DIAGNOSIS — E119 Type 2 diabetes mellitus without complications: Secondary | ICD-10-CM | POA: Insufficient documentation

## 2019-09-19 DIAGNOSIS — Y9259 Other trade areas as the place of occurrence of the external cause: Secondary | ICD-10-CM | POA: Insufficient documentation

## 2019-09-19 DIAGNOSIS — Z79899 Other long term (current) drug therapy: Secondary | ICD-10-CM | POA: Insufficient documentation

## 2019-09-19 DIAGNOSIS — Y990 Civilian activity done for income or pay: Secondary | ICD-10-CM | POA: Insufficient documentation

## 2019-09-19 DIAGNOSIS — Z794 Long term (current) use of insulin: Secondary | ICD-10-CM | POA: Insufficient documentation

## 2019-09-19 MED ORDER — IBUPROFEN 400 MG PO TABS
600.0000 mg | ORAL_TABLET | Freq: Once | ORAL | Status: AC
  Administered 2019-09-19: 14:00:00 600 mg via ORAL
  Filled 2019-09-19: qty 1

## 2019-09-19 MED ORDER — TETANUS-DIPHTH-ACELL PERTUSSIS 5-2.5-18.5 LF-MCG/0.5 IM SUSP
0.5000 mL | Freq: Once | INTRAMUSCULAR | Status: AC
  Administered 2019-09-19: 14:00:00 0.5 mL via INTRAMUSCULAR
  Filled 2019-09-19: qty 0.5

## 2019-09-19 MED ORDER — LIDOCAINE-EPINEPHRINE (PF) 2 %-1:200000 IJ SOLN
10.0000 mL | Freq: Once | INTRAMUSCULAR | Status: DC
  Filled 2019-09-19: qty 20

## 2019-09-19 NOTE — ED Triage Notes (Signed)
Patient here with small lac to left hand after cutting same with box cutter. No bleeding on arrival

## 2019-09-19 NOTE — ED Provider Notes (Signed)
[facility] EMERGENCY DEPARTMENT Provider Note   CSN: [id number] Arrival date & time: 09/19/19  1151     History   Chief Complaint No chief complaint on file.   HPI Ryan [name] is a 57 y.o. male.     HPI   Ryan [name] is a 58 y.o. male, with a history of DM and HTN, presenting to the ED with laceration to the left dorsal hand that occurred shortly prior to arrival. States he cut it with a box cutter at work. Hemorrhage controlled prior to arrival.  Denies numbness, weakness, other wounds.        Past Medical History:  Diagnosis Date   Achilles tendon rupture right   Arthritis    back and shoulders   Chest pain    due to cocaine use   Diabetes mellitus    Diabetes mellitus without complication ([place])    Drug abuse ([place])    cocaine, marijuana abuse   GERD (gastroesophageal reflux disease)    Hypertension    Renal insufficiency     Patient Active Problem List   Diagnosis Date Noted   COVID-19 virus infection 05/31/2019   Orthostatic syncope 05/31/2019   Insulin-requiring or dependent type II diabetes mellitus ([place]) 05/31/2019   Drug abuse ([place])    CKD (chronic kidney disease), stage III    Right ankle pain 05/15/2014   Need for Tdap vaccination 05/15/2014   Essential hypertension 05/14/2014   Diabetes mellitus ([place]) 05/14/2014   Rupture of right Achilles tendon 05/02/2014   Tendon rupture, Achilles 05/02/2014    Past Surgical History:  Procedure Laterality Date   ACHILLES TENDON SURGERY Right 05/02/2014   Procedure: RIGHT ACHILLES TENDON REPAIR;  Surgeon: Johnn Hai, MD;  Location: [facility];  Service: Orthopedics;  Laterality: Right;   CARDIAC CATHETERIZATION  05.31.2012   showing patent coronaries with no visualized vasospasm and EF of 55-60 %   ORBITAL FRACTURE SURGERY Left 2-3 yrs ago        Home Medications    Prior to Admission medications   Medication Sig Start Date End Date Taking?  Authorizing Provider  amLODipine (NORVASC) 10 MG tablet Take 1 tablet (10 mg total) by mouth every morning. Resume in 1 week 06/07/19   Elgergawy, Silver Huguenin, MD  famotidine (PEPCID) 20 MG tablet Take 1 tablet (20 mg total) by mouth 2 (two) times daily for 5 days. 05/01/19 05/31/19  Carrera Kiesel C, PA-C  insulin NPH-regular Human (NOVOLIN 70/30) (70-30) 100 UNIT/ML injection Take a directed. Patient taking differently: Inject 20 Units into the skin 2 (two) times daily with a meal. Take a directed. 12/21/17   Vanessa Kick, MD  lisinopril-hydrochlorothiazide (ZESTORETIC) 20-25 MG tablet Take 1 tablet by mouth every morning. Please resume in 1 week 06/07/19   Elgergawy, Silver Huguenin, MD  pantoprazole (PROTONIX) 20 MG tablet Take 1 tablet (20 mg total) by mouth 2 (two) times daily. 02/19/19   Virgel Manifold, MD  potassium chloride 20 MEQ TBCR Take 20 mEq by mouth daily. 05/28/19   Okey Regal, PA-C    Family History No family history on file.  Social History Social History   Tobacco Use   Smoking status: Never Smoker   Smokeless tobacco: Never Used  Substance Use Topics   Alcohol use: Yes    Comment: occassional   Drug use: Yes    Types: Cocaine, Marijuana    Comment: last cocaine use 2 to 3 yrs ago, last marijuana use 4 to 5 years  ago     Allergies   Patient has no known allergies.   Review of Systems Review of Systems  Skin: Positive for wound.  Neurological: Negative for weakness and numbness.     Physical Exam Updated Vital Signs BP (!) 151/86 (BP Location: Right Arm)    Pulse 70    Temp 98.2 F (36.8 C) (Oral)    Resp 16    SpO2 97%   Physical Exam Vitals signs and nursing note reviewed.  Constitutional:      General: He is not in acute distress.    Appearance: He is well-developed. He is not diaphoretic.  HENT:     Head: Normocephalic and atraumatic.  Eyes:     Conjunctiva/sclera: Conjunctivae normal.  Neck:     Musculoskeletal: Neck supple.  Cardiovascular:     Rate  and Rhythm: Normal rate and regular rhythm.     Pulses:          Radial pulses are 2+ on the right side and 2+ on the left side.  Pulmonary:     Effort: Pulmonary effort is normal.  Musculoskeletal:     Comments: Approximately 1 cm laceration to the dorsal left hand at the base of the thumb. Full range of motion in the left thumb through each of the cardinal directions without pain or noted difficulty.  Skin:    General: Skin is warm and dry.     Capillary Refill: Capillary refill takes less than 2 seconds.     Coloration: Skin is not pale.  Neurological:     Mental Status: He is alert.     Comments: Sensation grossly intact to light touch through each of the nerve distributions of the bilateral upper extremities. Abduction and adduction of the fingers intact against resistance. Grip strength equal bilaterally. Strength 5/5 through the cardinal directions of the bilateral wrists. Patient can touch the thumb to each one of the fingertips without difficulty.  Patient can hold the "OK" sign against resistance.  Psychiatric:        Behavior: Behavior normal.           ED Treatments / Results  Labs (all labs ordered are listed, but only abnormal results are displayed) Labs Reviewed - No data to display  EKG None  Radiology No results found.  Procedures .Marland KitchenLaceration Repair  Date/Time: 09/19/2019 2:25 PM Performed by: Anselm Pancoast, PA-C Authorized by: Anselm Pancoast, PA-C   Consent:    Consent obtained:  Verbal   Consent given by:  Patient   Risks discussed:  Infection, pain, poor cosmetic result, poor wound healing and need for additional repair Anesthesia (see MAR for exact dosages):    Anesthesia method:  Local infiltration   Local anesthetic:  Lidocaine 2% WITH epi Laceration details:    Location:  Hand   Hand location:  L hand, dorsum   Length (cm):  1 Repair type:    Repair type:  Simple Pre-procedure details:    Preparation:  Patient was prepped and draped  in usual sterile fashion Exploration:    Wound exploration: entire depth of wound probed and visualized   Treatment:    Area cleansed with:  Betadine and soap and water   Amount of cleaning:  Extensive   Irrigation solution:  Tap water   Irrigation method:  Tap Skin repair:    Repair method:  Sutures   Suture size:  4-0   Wound skin closure material used: Vicryl.   Suture technique:  Simple  interrupted   Number of sutures:  3 Approximation:    Approximation:  Close Post-procedure details:    Dressing:  Antibiotic ointment, non-adherent dressing and sterile dressing   Patient tolerance of procedure:  Tolerated well, no immediate complications   (including critical care time)  Medications Ordered in ED Medications  lidocaine-EPINEPHrine (XYLOCAINE W/EPI) 2 %-1:200000 (PF) injection 10 mL (has no administration in time range)  ibuprofen (ADVIL) tablet 600 mg (600 mg Oral Given 09/19/19 1356)  Tdap (BOOSTRIX) injection 0.5 mL (0.5 mLs Intramuscular Given 09/19/19 1357)     Initial Impression / Assessment and Plan / ED Course  I have reviewed the triage vital signs and the nursing notes.  Pertinent labs & imaging results that were available during my care of the patient were reviewed by me and considered in my medical decision making (see chart for details).        Patient presents with a left hand laceration.  No evidence of neurovascular compromise.  Repaired without immediate complication. The patient was given instructions for home care as well as return precautions. Patient voices understanding of these instructions, accepts the plan, and is comfortable with discharge.    Final Clinical Impressions(s) / ED Diagnoses   Final diagnoses:  Laceration of left hand without foreign body, initial encounter    ED Discharge Orders    None       Concepcion LivingJoy, Sueko Dimichele C, PA-C 09/19/19 1622    LongArlyss Repress, Joshua G, MD 09/20/19 [phone]

## 2019-09-19 NOTE — Discharge Instructions (Signed)
°  Wound Care - Laceration You may remove the bandage after 24 hours. Clean the wound and surrounding area gently with tap water and mild soap. Rinse well and blot dry. Do not scrub the wound, as this may cause the wound edges to come apart. You may shower, but avoid submerging the wound, such as with a bath or swimming. Clean the wound daily to prevent infection. Do not use cleaners such as hydrogen peroxide or alcohol.   Scar reduction: Application of a topical antibiotic ointment, such as Neosporin, after the wound has begun to close and heal well can decrease scab formation and reduce scarring. After the wound has healed and wound closures have been removed, application of ointments such as Aquaphor can also reduce scar formation.  The key to scar reduction is keeping the skin well hydrated and supple. Drinking plenty of water throughout the day (At least eight 8oz glasses of water a day) is essential to staying well hydrated.  Sun exposure: Keep the wound out of the sun. After the wound has healed, continue to protect it from the sun by wearing protective clothing or applying sunscreen.  Pain: You may use Tylenol, naproxen, or ibuprofen for pain.  Suture/staple removal: The sutures should dissolve and fall out on their own.  If they fail to fall out in about 10 days, may return to the ED or go to a clinic to have them removed.  Return to the ED sooner should the wound edges come apart or signs of infection arise, such as spreading redness, puffiness/swelling, pus draining from the wound, severe increase in pain, fever over 100.49F, or any other major issues.  For prescription assistance, may try using prescription discount sites or apps, such as goodrx.com

## 2019-10-02 ENCOUNTER — Other Ambulatory Visit: Payer: Self-pay

## 2019-10-02 ENCOUNTER — Encounter (HOSPITAL_COMMUNITY): Payer: Self-pay | Admitting: *Deleted

## 2019-10-02 ENCOUNTER — Emergency Department (HOSPITAL_COMMUNITY)
Admission: EM | Admit: 2019-10-02 | Discharge: 2019-10-02 | Disposition: A | Discharge status: Home / Self Care | Payer: Self-pay | Attending: Emergency Medicine | Admitting: Emergency Medicine

## 2019-10-02 DIAGNOSIS — Z794 Long term (current) use of insulin: Secondary | ICD-10-CM | POA: Insufficient documentation

## 2019-10-02 DIAGNOSIS — Z4802 Encounter for removal of sutures: Secondary | ICD-10-CM | POA: Insufficient documentation

## 2019-10-02 DIAGNOSIS — I129 Hypertensive chronic kidney disease with stage 1 through stage 4 chronic kidney disease, or unspecified chronic kidney disease: Secondary | ICD-10-CM | POA: Insufficient documentation

## 2019-10-02 DIAGNOSIS — X58XXXD Exposure to other specified factors, subsequent encounter: Secondary | ICD-10-CM | POA: Insufficient documentation

## 2019-10-02 DIAGNOSIS — Z79899 Other long term (current) drug therapy: Secondary | ICD-10-CM | POA: Insufficient documentation

## 2019-10-02 DIAGNOSIS — N183 Chronic kidney disease, stage 3 unspecified: Secondary | ICD-10-CM | POA: Insufficient documentation

## 2019-10-02 DIAGNOSIS — S61412D Laceration without foreign body of left hand, subsequent encounter: Secondary | ICD-10-CM | POA: Insufficient documentation

## 2019-10-02 DIAGNOSIS — E1122 Type 2 diabetes mellitus with diabetic chronic kidney disease: Secondary | ICD-10-CM | POA: Insufficient documentation

## 2019-10-02 NOTE — Discharge Instructions (Signed)
Place Neosporin over the wound.  Follow-up with PCP as scheduled.

## 2019-10-02 NOTE — ED Provider Notes (Signed)
[facility] EMERGENCY DEPARTMENT Provider Note   CSN: [id number] Arrival date & time: 10/02/19  1519     History   Chief Complaint Suture removal   HPI Ryan [name] is a 58 y.o. male for evaluation of suture removal.  3 sutures placed to left dorsal aspect between first and second digit on 7 of this month, 13 days PTA.  Patient denies any fever, chills, drainage, bleeding, redness, swelling, warmth, decreased range of motion to extremities.  He denies any current complaints.  Denies any pain.  History obtained from patient and past medical records. No  Interpreter is used.     HPI  Past Medical History:  Diagnosis Date  . Achilles tendon rupture right  . Arthritis    back and shoulders  . Chest pain    due to cocaine use  . Diabetes mellitus   . Diabetes mellitus without complication (HCC)   . Drug abuse (HCC)    cocaine, marijuana abuse  . GERD (gastroesophageal reflux disease)   . Hypertension   . Renal insufficiency     Patient Active Problem List   Diagnosis Date Noted  . COVID-19 virus infection 05/31/2019  . Orthostatic syncope 05/31/2019  . Insulin-requiring or dependent type II diabetes mellitus (HCC) 05/31/2019  . Drug abuse (HCC)   . CKD (chronic kidney disease), stage III   . Right ankle pain 05/15/2014  . Need for Tdap vaccination 05/15/2014  . Essential hypertension 05/14/2014  . Diabetes mellitus (HCC) 05/14/2014  . Rupture of right Achilles tendon 05/02/2014  . Tendon rupture, Achilles 05/02/2014    Past Surgical History:  Procedure Laterality Date  . ACHILLES TENDON SURGERY Right 05/02/2014   Procedure: RIGHT ACHILLES TENDON REPAIR;  Surgeon: Javier Docker, MD;  Location: [facility];  Service: Orthopedics;  Laterality: Right;  . CARDIAC CATHETERIZATION  05.31.2012   showing patent coronaries with no visualized vasospasm and EF of 55-60 %  . ORBITAL FRACTURE SURGERY Left 2-3 yrs ago        Home Medications    Prior  to Admission medications   Medication Sig Start Date End Date Taking? Authorizing Provider  amLODipine (NORVASC) 10 MG tablet Take 1 tablet (10 mg total) by mouth every morning. Resume in 1 week 06/07/19   Elgergawy, Leana Roe, MD  famotidine (PEPCID) 20 MG tablet Take 1 tablet (20 mg total) by mouth 2 (two) times daily for 5 days. 05/01/19 05/31/19  Joy, Shawn C, PA-C  insulin NPH-regular Human (NOVOLIN 70/30) (70-30) 100 UNIT/ML injection Take a directed. Patient taking differently: Inject 20 Units into the skin 2 (two) times daily with a meal. Take a directed. 12/21/17   Mardella Layman, MD  lisinopril-hydrochlorothiazide (ZESTORETIC) 20-25 MG tablet Take 1 tablet by mouth every morning. Please resume in 1 week 06/07/19   Elgergawy, Leana Roe, MD  pantoprazole (PROTONIX) 20 MG tablet Take 1 tablet (20 mg total) by mouth 2 (two) times daily. 02/19/19   Raeford Razor, MD  potassium chloride 20 MEQ TBCR Take 20 mEq by mouth daily. 05/28/19   Eyvonne Mechanic, PA-C    Family History No family history on file.  Social History Social History   Tobacco Use  . Smoking status: Never Smoker  . Smokeless tobacco: Never Used  Substance Use Topics  . Alcohol use: Yes    Comment: occassional  . Drug use: Yes    Types: Cocaine, Marijuana    Comment: last cocaine use 2 to 3 yrs ago, last marijuana use  4 to 5 years ago     Allergies   Patient has no known allergies.   Review of Systems Review of Systems  Constitutional: Negative.   HENT: Negative.   Respiratory: Negative.   Cardiovascular: Negative.   Gastrointestinal: Negative.   Genitourinary: Negative.   Musculoskeletal: Negative.   Skin: Positive for wound.  Neurological: Negative.   All other systems reviewed and are negative.    Physical Exam Updated Vital Signs BP 138/75 (BP Location: Left Arm)   Pulse 88   Temp (!) 97.5 F (36.4 C) (Oral)   Resp 16   SpO2 95%   Physical Exam Vitals signs and nursing note reviewed.   Constitutional:      General: He is not in acute distress.    Appearance: He is well-developed. He is not ill-appearing, toxic-appearing or diaphoretic.  HENT:     Head: Normocephalic and atraumatic.     Nose: Nose normal.     Mouth/Throat:     Mouth: Mucous membranes are moist.  Eyes:     Pupils: Pupils are equal, round, and reactive to light.  Neck:     Musculoskeletal: Normal range of motion and neck supple.  Cardiovascular:     Rate and Rhythm: Normal rate and regular rhythm.     Pulses: Normal pulses.     Heart sounds: Normal heart sounds.  Pulmonary:     Effort: Pulmonary effort is normal. No respiratory distress.     Breath sounds: Normal breath sounds.  Abdominal:     General: Bowel sounds are normal. There is no distension.     Palpations: Abdomen is soft.  Musculoskeletal: Normal range of motion.     Comments: Full range of motion to left upper extremity without difficulty.  Equal grip strength.  Skin:    General: Skin is warm and dry.     Comments: 3 sutures to left dorsal aspect of left hand.  No edema, erythema, ecchymosis or warmth.  No fluctuance, induration.  No bleeding or drainage.  Neurological:     General: No focal deficit present.     Mental Status: He is alert.     Comments: Intact sensation to left upper extremity.      ED Treatments / Results  Labs (all labs ordered are listed, but only abnormal results are displayed) Labs Reviewed - No data to display  EKG None  Radiology No results found.  Procedures .Suture Removal  Date/Time: 10/02/2019 3:40 PM Performed by: Linwood DibblesHenderly, Enrika Aguado A, PA-C Authorized by: Linwood DibblesHenderly, Mellisa Arshad A, PA-C   Consent:    Consent obtained:  Verbal   Consent given by:  Patient   Risks discussed:  Bleeding, pain and wound separation   Alternatives discussed:  No treatment, delayed treatment, alternative treatment, observation and referral Location:    Location:  Upper extremity   Upper extremity location:  Hand    Hand location:  L hand Procedure details:    Wound appearance:  No signs of infection, good wound healing and clean   Number of sutures removed:  3   Number of staples removed:  0 Post-procedure details:    Post-removal:  Band-Aid applied   Patient tolerance of procedure:  Tolerated well, no immediate complications   (including critical care time)  Medications Ordered in ED Medications - No data to display   Initial Impression / Assessment and Plan / ED Course  I have reviewed the triage vital signs and the nursing notes.  Pertinent labs & imaging results that  were available during my care of the patient were reviewed by me and considered in my medical decision making (see chart for details).   Pt to ER for staple/suture removal and wound check as above. Procedure tolerated well. Vitals normal, no signs of infection. Normal MSK exam. NV intact. Scar minimization & return precautions given at dc.         Final Clinical Impressions(s) / ED Diagnoses   Final diagnoses:  Visit for suture removal    ED Discharge Orders    None       Darron Stuck A, PA-C 10/02/19 1541    Lennice Sites, DO 10/03/19 0128

## 2019-10-02 NOTE — ED Triage Notes (Signed)
PT here for suture removal Lt thumb

## 2020-03-25 ENCOUNTER — Emergency Department (HOSPITAL_BASED_OUTPATIENT_CLINIC_OR_DEPARTMENT_OTHER): Payer: Self-pay

## 2020-03-25 ENCOUNTER — Encounter (HOSPITAL_COMMUNITY): Payer: Self-pay | Admitting: Emergency Medicine

## 2020-03-25 ENCOUNTER — Emergency Department (HOSPITAL_COMMUNITY)
Admission: EM | Admit: 2020-03-25 | Discharge: 2020-03-25 | Disposition: A | Discharge status: Home / Self Care | Payer: Self-pay | Attending: Emergency Medicine | Admitting: Emergency Medicine

## 2020-03-25 DIAGNOSIS — M7989 Other specified soft tissue disorders: Secondary | ICD-10-CM

## 2020-03-25 DIAGNOSIS — T148XXA Other injury of unspecified body region, initial encounter: Secondary | ICD-10-CM

## 2020-03-25 DIAGNOSIS — Y929 Unspecified place or not applicable: Secondary | ICD-10-CM | POA: Insufficient documentation

## 2020-03-25 DIAGNOSIS — M79604 Pain in right leg: Secondary | ICD-10-CM

## 2020-03-25 DIAGNOSIS — M79609 Pain in unspecified limb: Secondary | ICD-10-CM

## 2020-03-25 DIAGNOSIS — Z794 Long term (current) use of insulin: Secondary | ICD-10-CM | POA: Insufficient documentation

## 2020-03-25 DIAGNOSIS — Z8616 Personal history of COVID-19: Secondary | ICD-10-CM | POA: Insufficient documentation

## 2020-03-25 DIAGNOSIS — N183 Chronic kidney disease, stage 3 unspecified: Secondary | ICD-10-CM | POA: Insufficient documentation

## 2020-03-25 DIAGNOSIS — I129 Hypertensive chronic kidney disease with stage 1 through stage 4 chronic kidney disease, or unspecified chronic kidney disease: Secondary | ICD-10-CM | POA: Insufficient documentation

## 2020-03-25 DIAGNOSIS — S76911A Strain of unspecified muscles, fascia and tendons at thigh level, right thigh, initial encounter: Secondary | ICD-10-CM | POA: Insufficient documentation

## 2020-03-25 DIAGNOSIS — Y999 Unspecified external cause status: Secondary | ICD-10-CM | POA: Insufficient documentation

## 2020-03-25 DIAGNOSIS — Z79899 Other long term (current) drug therapy: Secondary | ICD-10-CM | POA: Insufficient documentation

## 2020-03-25 DIAGNOSIS — Y939 Activity, unspecified: Secondary | ICD-10-CM | POA: Insufficient documentation

## 2020-03-25 DIAGNOSIS — E1122 Type 2 diabetes mellitus with diabetic chronic kidney disease: Secondary | ICD-10-CM | POA: Insufficient documentation

## 2020-03-25 DIAGNOSIS — X58XXXA Exposure to other specified factors, initial encounter: Secondary | ICD-10-CM | POA: Insufficient documentation

## 2020-03-25 MED ORDER — LIDOCAINE 5 % EX PTCH
1.0000 | MEDICATED_PATCH | Freq: Once | CUTANEOUS | Status: DC
  Administered 2020-03-25: 21:00:00 1 via TRANSDERMAL
  Filled 2020-03-25: qty 1

## 2020-03-25 MED ORDER — CYCLOBENZAPRINE HCL 10 MG PO TABS: 10.0000 mg | ORAL_TABLET | Freq: Every day | ORAL | 0 refills | Status: DC

## 2020-03-25 NOTE — ED Provider Notes (Signed)
[facility]ENC[facility] Provider Note   CSN: [id number] Arrival date & time: 03/25/20  1725     History Chief Complaint  Patient presents with  . Leg Pain    Ryan [name] is a 59 y.o. male.  HPI HPI Comments: Ryan [name] is a 59 y.o. male with a history of GERD, hypertension, diabetes mellitus, CKD who presents to the Emergency Department complaining of atraumatic, worsening, 10/10 right hamstring pain.  Patient states he is unsure when his pain began but notes that it worsens with ambulation and when sitting for long periods of time.  He reports intermittent chronic mild edema of the lower extremities which worsens when standing throughout the day.  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  He states his edema will alleviate slightly at night.  He is not a smoker and only drinks socially.  He denies any current drug use.  He denies fevers, chills, chest pain, shortness of breath, URI symptoms, abdominal pain, nausea, vomiting, diarrhea, syncope.     Past Medical History:  Diagnosis Date  . Achilles tendon rupture right  . Arthritis    back and shoulders  . Chest pain    due to cocaine use  . Diabetes mellitus   . Diabetes mellitus without complication (HCC)   . Drug abuse (HCC)    cocaine, marijuana abuse  . GERD (gastroesophageal reflux disease)   . Hypertension   . Renal insufficiency     Patient Active Problem List   Diagnosis Date Noted  . COVID-19 virus infection 05/31/2019  . Orthostatic syncope 05/31/2019  . Insulin-requiring or dependent type II diabetes mellitus (HCC) 05/31/2019  . Drug abuse (HCC)   . CKD (chronic kidney disease), stage III   . Right ankle pain 05/15/2014  . Need for Tdap vaccination 05/15/2014  . Essential hypertension 05/14/2014  . Diabetes mellitus (HCC) 05/14/2014  . Rupture of right Achilles tendon 05/02/2014  . Tendon rupture, Achilles 05/02/2014    Past  Surgical History:  Procedure Laterality Date  . ACHILLES TENDON SURGERY Right 05/02/2014   Procedure: RIGHT ACHILLES TENDON REPAIR;  Surgeon: Javier DockerJeffrey C Beane, MD;  Location: [facility];  Service: Orthopedics;  Laterality: Right;  . CARDIAC CATHETERIZATION  05.31.2012   showing patent coronaries with no visualized vasospasm and EF of 55-60 %  . ORBITAL FRACTURE SURGERY Left 2-3 yrs ago       No family history on file.  Social History   Tobacco Use  . Smoking status: Never Smoker  . Smokeless tobacco: Never Used  Substance Use Topics  . Alcohol use: Yes    Comment: occassional  . Drug use: Yes    Types: Cocaine, Marijuana    Comment: last cocaine use 2 to 3 yrs ago, last marijuana use 4 to 5 years ago    Home Medications Prior to Admission medications   Medication Sig Start Date End Date Taking? Authorizing Provider  amLODipine (NORVASC) 10 MG tablet Take 1 tablet (10 mg total) by mouth every morning. Resume in 1 week 06/07/19   Elgergawy, Leana Roeawood S, MD  famotidine (PEPCID) 20 MG tablet Take 1 tablet (20 mg total) by mouth 2 (two) times daily for 5 days. 05/01/19 05/31/19  Joy, Shawn C, PA-C  insulin NPH-regular Human (NOVOLIN 70/30) (70-30) 100 UNIT/ML injection Take a directed. Patient taking differently: Inject 20 Units into the skin 2 (two) times daily with a meal. Take a  directed. 12/21/17   Mardella Layman, MD  lisinopril-hydrochlorothiazide (ZESTORETIC) 20-25 MG tablet Take 1 tablet by mouth every morning. Please resume in 1 week 06/07/19   Elgergawy, Leana Roe, MD  pantoprazole (PROTONIX) 20 MG tablet Take 1 tablet (20 mg total) by mouth 2 (two) times daily. 02/19/19   Raeford Razor, MD  potassium chloride 20 MEQ TBCR Take 20 mEq by mouth daily. 05/28/19   Eyvonne Mechanic, PA-C    Allergies    Patient has no known allergies.  Review of Systems   Review of Systems  All other systems reviewed and are negative. Ten systems reviewed and are negative for acute change, except as noted in  the HPI.   Physical Exam Updated Vital Signs BP (!) 174/96 (BP Location: Right Arm)   Pulse 89   Temp 98 F (36.7 C) (Oral)   Resp 16   SpO2 97%   Physical Exam Vitals and nursing note reviewed.  Constitutional:      General: He is not in acute distress.    Appearance: Normal appearance. He is obese. He is not ill-appearing, toxic-appearing or diaphoretic.  HENT:     Head: Normocephalic and atraumatic.     Right Ear: External ear normal.     Left Ear: External ear normal.     Nose: Nose normal.     Mouth/Throat:     Mouth: Mucous membranes are moist.     Pharynx: Oropharynx is clear. No oropharyngeal exudate or posterior oropharyngeal erythema.  Eyes:     General: No scleral icterus.       Right eye: No discharge.        Left eye: No discharge.     Extraocular Movements: Extraocular movements intact.     Conjunctiva/sclera: Conjunctivae normal.     Pupils: Pupils are equal, round, and reactive to light.  Cardiovascular:     Rate and Rhythm: Normal rate and regular rhythm.     Pulses: Normal pulses.     Heart sounds: Normal heart sounds. No murmur. No friction rub. No gallop.   Pulmonary:     Effort: Pulmonary effort is normal. No respiratory distress.     Breath sounds: Normal breath sounds. No stridor. No wheezing, rhonchi or rales.  Abdominal:     Tenderness: There is no abdominal tenderness.     Comments: Protuberant abdomen that is nontender in all 4 quadrants with deep palpation  Musculoskeletal:        General: Tenderness present. No deformity or signs of injury.     Cervical back: Normal range of motion and neck supple. No tenderness.     Right lower leg: Edema present.     Left lower leg: Edema present.     Comments: Moderate TTP appreciated along the hamstring of the right thigh.  Negative straight leg raise on the right.  Negative contralateral straight leg raise.  No midline TTP appreciated of the C, T, L-spine.  Distal sensation is intact.  Strength is 5 out  of 5 in the bilateral upper and lower extremities.  1+ pitting edema noted in the pretibial region bilaterally  Neurological:     General: No focal deficit present.     Mental Status: He is alert and oriented to person, place, and time.     Comments: Patient ambulates with an antalgic gait.  Psychiatric:        Mood and Affect: Mood normal.        Behavior: Behavior normal.    ED Results /  Procedures / Treatments   Labs (all labs ordered are listed, but only abnormal results are displayed) Labs Reviewed - No data to display  EKG None  Radiology VAS [place] LOWER EXTREMITY VENOUS (DVT) (ONLY [facility])  Result Date: 03/25/2020  Lower Venous DVTStudy Indications: Pain, and Swelling.  Limitations: Body habitus. Performing Technologist: Levada Schilling RDMS, RVT  Examination Guidelines: A complete evaluation includes B-mode imaging, spectral Doppler, color Doppler, and power Doppler as needed of all accessible portions of each vessel. Bilateral testing is considered an integral part of a complete examination. Limited examinations for reoccurring indications may be performed as noted. The reflux portion of the exam is performed with the patient in reverse Trendelenburg.  +---------+---------------+---------+-----------+----------+--------------+ RIGHT    CompressibilityPhasicitySpontaneityPropertiesThrombus Aging +---------+---------------+---------+-----------+----------+--------------+ CFV      Full           Yes      Yes                                 +---------+---------------+---------+-----------+----------+--------------+ SFJ      Full                                                        +---------+---------------+---------+-----------+----------+--------------+ FV Prox  Full                                                        +---------+---------------+---------+-----------+----------+--------------+ FV Mid   Full                                                         +---------+---------------+---------+-----------+----------+--------------+ FV DistalFull                                                        +---------+---------------+---------+-----------+----------+--------------+ PFV      Full                                                        +---------+---------------+---------+-----------+----------+--------------+ POP      Full           Yes      Yes                                 +---------+---------------+---------+-----------+----------+--------------+ PTV      Full                                                        +---------+---------------+---------+-----------+----------+--------------+  PERO     Full                                                        +---------+---------------+---------+-----------+----------+--------------+ GSV      Full                                                        +---------+---------------+---------+-----------+----------+--------------+ Moderate interstitial fluid noted throughout right calf.  +----+---------------+---------+-----------+----------+--------------+ LEFTCompressibilityPhasicitySpontaneityPropertiesThrombus Aging +----+---------------+---------+-----------+----------+--------------+ CFV Full           Yes      Yes                                 +----+---------------+---------+-----------+----------+--------------+     Summary: RIGHT: - There is no evidence of deep vein thrombosis in the lower extremity.  - No cystic structure found in the popliteal fossa.  LEFT: - No evidence of common femoral vein obstruction.  *See table(s) above for measurements and observations. Electronically signed by Deitra Mayo MD on 03/25/2020 at 7:50:50 PM.    Final     Procedures Procedures (including critical care time)  Medications Ordered in ED Medications  lidocaine (LIDODERM) 5 % 1 patch (has no administration in time range)    ED Course  I  have reviewed the triage vital signs and the nursing notes.  Pertinent labs & imaging results that were available during my care of the patient were reviewed by me and considered in my medical decision making (see chart for details).    MDM Rules/Calculators/A&P                      Patient is a pleasant 59 year old male that presents with atraumatic right hamstring pain as well as intermittent chronic lower extremity edema.  He had an ultrasound performed in triage which was negative for DVT.  Physical exam is significant for moderate TTP of the right hamstring.  Exam nonconcerning for sciatica.  This seems musculoskeletal in nature.  His lower extremity edema is chronic and waxes and wanes throughout the day.  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  We discussed purchasing compression stockings and wearing them while working.  He has a history of CKD we discussed not taking NSAIDs due to this.  He understands he can take Tylenol for his pain.  Will prescribe a short course of Flexeril as well.  He understands not to take this prior to operating a motor vehicle or heavy machinery.  He understands it is sedating.  We additionally discussed the RICE method.  Patient understands to return to the emergency department with any new or worsening symptoms.  He verbalized understanding of the above plan.  He was amicable at the time of discharge.  Vital signs stable.   Patient discharged to home/self care.  Condition at discharge: Stable  Note: Portions of this report may have been transcribed using voice recognition software. Every effort was made to ensure accuracy; however, inadvertent computerized transcription errors may be present.    Final Clinical Impression(s) / ED Diagnoses Final  diagnoses:  Muscle strain  Right leg pain    Rx / DC Orders ED Discharge Orders         Ordered    cyclobenzaprine (FLEXERIL) 10 MG tablet  Daily at bedtime     03/25/20 2054            Placido Sou, PA-C 03/25/20 2110    Tilden Fossa, MD 03/25/20 [phone]

## 2020-03-25 NOTE — Discharge Instructions (Signed)
Per discussion, I am prescribing you Flexeril which is a muscle relaxant and is sedating.  Please make sure to take this prior to bed.  Do not operate a motor vehicle after taking this medication.  Also please do not consume alcohol while taking this medication.  You can continue to take Tylenol as needed for pain throughout the day.  Also consider applying Biofreeze and icy hot to the region as well as Voltaren gel.  All of these can be purchased over-the-counter at your local drugstore.  Please not hesitate to return to the emergency department with any new or worsening symptoms.  It was a pleasure to meet you.

## 2020-03-25 NOTE — Progress Notes (Signed)
Right lower extremity venous duplex complete.  Please see CV Proc tab for preliminary results. Levin Bacon- RDMS, RVT 7:34 PM  03/25/2020

## 2020-03-25 NOTE — ED Triage Notes (Signed)
Pt reports left leg pain from knee to buttocks for 4 days with swelling in both lower legs. No injury.

## 2020-04-07 ENCOUNTER — Emergency Department (HOSPITAL_COMMUNITY)
Admission: EM | Admit: 2020-04-07 | Discharge: 2020-04-07 | Disposition: A | Discharge status: Home / Self Care | Payer: Self-pay | Attending: Emergency Medicine | Admitting: Emergency Medicine

## 2020-04-07 ENCOUNTER — Other Ambulatory Visit: Payer: Self-pay

## 2020-04-07 DIAGNOSIS — Z79899 Other long term (current) drug therapy: Secondary | ICD-10-CM | POA: Insufficient documentation

## 2020-04-07 DIAGNOSIS — Y999 Unspecified external cause status: Secondary | ICD-10-CM | POA: Insufficient documentation

## 2020-04-07 DIAGNOSIS — Y92810 Car as the place of occurrence of the external cause: Secondary | ICD-10-CM | POA: Insufficient documentation

## 2020-04-07 DIAGNOSIS — X501XXA Overexertion from prolonged static or awkward postures, initial encounter: Secondary | ICD-10-CM | POA: Insufficient documentation

## 2020-04-07 DIAGNOSIS — E1122 Type 2 diabetes mellitus with diabetic chronic kidney disease: Secondary | ICD-10-CM | POA: Insufficient documentation

## 2020-04-07 DIAGNOSIS — S76301A Unspecified injury of muscle, fascia and tendon of the posterior muscle group at thigh level, right thigh, initial encounter: Secondary | ICD-10-CM | POA: Insufficient documentation

## 2020-04-07 DIAGNOSIS — Z794 Long term (current) use of insulin: Secondary | ICD-10-CM | POA: Insufficient documentation

## 2020-04-07 DIAGNOSIS — I129 Hypertensive chronic kidney disease with stage 1 through stage 4 chronic kidney disease, or unspecified chronic kidney disease: Secondary | ICD-10-CM | POA: Insufficient documentation

## 2020-04-07 DIAGNOSIS — N183 Chronic kidney disease, stage 3 unspecified: Secondary | ICD-10-CM | POA: Insufficient documentation

## 2020-04-07 DIAGNOSIS — Z8616 Personal history of COVID-19: Secondary | ICD-10-CM | POA: Insufficient documentation

## 2020-04-07 DIAGNOSIS — Y9389 Activity, other specified: Secondary | ICD-10-CM | POA: Insufficient documentation

## 2020-04-07 MED ORDER — METHOCARBAMOL 500 MG PO TABS: 1000.0000 mg | ORAL_TABLET | Freq: Four times a day (QID) | ORAL | 0 refills | Status: DC

## 2020-04-07 MED ORDER — KETOROLAC TROMETHAMINE 60 MG/2ML IM SOLN
30.0000 mg | Freq: Once | INTRAMUSCULAR | Status: AC
  Administered 2020-04-07: 30 mg via INTRAMUSCULAR
  Filled 2020-04-07: qty 2

## 2020-04-07 NOTE — ED Triage Notes (Signed)
Pt here for evaluation of pain in R leg from knee to buttocks onset 0430 this morning when getting out of a car. Pt sts he heard a pop in his leg and now can barely walk. Ambulatory.

## 2020-04-07 NOTE — ED Notes (Signed)
Patient verbalizes understanding of discharge instructions . Opportunity for questions and answers were provided . Armband removed by staff ,Pt discharged from ED. W/C  offered at D/C  and Declined W/C at D/C and was escorted to lobby by RN.  

## 2020-04-07 NOTE — ED Provider Notes (Signed)
[facility] EMERGENCY DEPARTMENT Provider Note   CSN: [id number] Arrival date & time: 04/07/20  6440     History Chief Complaint  Patient presents with  . Leg Pain    Ryan [name] is a 59 y.o. male.  Patient presents to the emergency department with right posterior and medial thigh pain starting acutely when he tried to get out of a car at 430 this morning.  Patient states that he heard a loud pop and now has severe pain with bearing weight and movement of the leg.  He denies any back pain.  He denies any numbness or tingling down into his leg.  No treatments prior to arrival.  Denies history of disc problems in his back or back surgeries.        Past Medical History:  Diagnosis Date  . Achilles tendon rupture right  . Arthritis    back and shoulders  . Chest pain    due to cocaine use  . Diabetes mellitus   . Diabetes mellitus without complication ([place])   . Drug abuse ([place])    cocaine, marijuana abuse  . GERD (gastroesophageal reflux disease)   . Hypertension   . Renal insufficiency     Patient Active Problem List   Diagnosis Date Noted  . COVID-19 virus infection 05/31/2019  . Orthostatic syncope 05/31/2019  . Insulin-requiring or dependent type II diabetes mellitus ([place]) 05/31/2019  . Drug abuse ([place])   . CKD (chronic kidney disease), stage III   . Right ankle pain 05/15/2014  . Need for Tdap vaccination 05/15/2014  . Essential hypertension 05/14/2014  . Diabetes mellitus ([place]) 05/14/2014  . Rupture of right Achilles tendon 05/02/2014  . Tendon rupture, Achilles 05/02/2014    Past Surgical History:  Procedure Laterality Date  . ACHILLES TENDON SURGERY Right 05/02/2014   Procedure: RIGHT ACHILLES TENDON REPAIR;  Surgeon: Johnn Hai, MD;  Location: [facility];  Service: Orthopedics;  Laterality: Right;  . CARDIAC CATHETERIZATION  05.31.2012   showing patent coronaries with no visualized vasospasm and EF of 55-60 %  . ORBITAL FRACTURE  SURGERY Left 2-3 yrs ago       No family history on file.  Social History   Tobacco Use  . Smoking status: Never Smoker  . Smokeless tobacco: Never Used  Substance Use Topics  . Alcohol use: Yes    Comment: occassional  . Drug use: Yes    Types: Cocaine, Marijuana    Comment: last cocaine use 2 to 3 yrs ago, last marijuana use 4 to 5 years ago    Home Medications Prior to Admission medications   Medication Sig Start Date End Date Taking? Authorizing Provider  amLODipine (NORVASC) 10 MG tablet Take 1 tablet (10 mg total) by mouth every morning. Resume in 1 week 06/07/19   Elgergawy, Silver Huguenin, MD  carvedilol (COREG) 25 MG tablet Take 25 mg by mouth 2 (two) times daily. 03/03/20   [provider]  cyclobenzaprine (FLEXERIL) 10 MG tablet Take 1 tablet (10 mg total) by mouth at bedtime. 03/25/20   Rayna Sexton, PA-C  famotidine (PEPCID) 20 MG tablet Take 1 tablet (20 mg total) by mouth 2 (two) times daily for 5 days. Patient taking differently: Take 20 mg by mouth daily.  05/01/19 03/25/20  Joy, Shawn C, PA-C  insulin NPH-regular Human (NOVOLIN 70/30) (70-30) 100 UNIT/ML injection Take a directed. Patient taking differently: Inject 20 Units into the skin 2 (two) times daily with a meal. Take a  directed. 12/21/17   Mardella Layman, MD  lisinopril-hydrochlorothiazide (ZESTORETIC) 20-25 MG tablet Take 1 tablet by mouth every morning. Please resume in 1 week Patient taking differently: Take 1 tablet by mouth every morning.  06/07/19   Elgergawy, Leana Roe, MD  methocarbamol (ROBAXIN) 500 MG tablet Take 2 tablets (1,000 mg total) by mouth 4 (four) times daily. 04/07/20   Renne Crigler, PA-C  MOBIC 15 MG tablet Take 15 mg by mouth daily. 03/03/20   [provider]  omeprazole (PRILOSEC) 20 MG capsule Take 20 mg by mouth daily. 03/13/20   [provider]  pantoprazole (PROTONIX) 20 MG tablet Take 1 tablet (20 mg total) by mouth 2 (two) times daily. Patient taking differently:  Take 20 mg by mouth daily.  02/19/19   Raeford Razor, MD  potassium chloride 20 MEQ TBCR Take 20 mEq by mouth daily. 05/28/19   Hedges, Tinnie Gens, PA-C  SEROQUEL 50 MG tablet Take 50 mg by mouth at bedtime. 03/03/20   [provider]  ZYRTEC ALLERGY 10 MG tablet Take 10 mg by mouth at bedtime. 03/03/20   [provider]    Allergies    Patient has no known allergies.  Review of Systems   Review of Systems  Constitutional: Negative for fatigue.  Musculoskeletal: Positive for gait problem and myalgias. Negative for arthralgias, back pain, joint swelling and neck pain.  Skin: Negative for wound.  Neurological: Negative for weakness and numbness.    Physical Exam Updated Vital Signs BP (!) 181/96 (BP Location: Left Arm)   Pulse 88   Temp 97.8 F (36.6 C) (Oral)   Resp 18   SpO2 99%   Physical Exam Vitals and nursing note reviewed.  Constitutional:      Appearance: He is well-developed.  HENT:     Head: Normocephalic and atraumatic.  Eyes:     Conjunctiva/sclera: Conjunctivae normal.  Cardiovascular:     Pulses: Normal pulses. No decreased pulses.          Dorsalis pedis pulses are 2+ on the right side and 2+ on the left side.  Musculoskeletal:        General: Tenderness present.     Cervical back: Normal range of motion and neck supple. No tenderness.     Thoracic back: No tenderness.     Lumbar back: No tenderness.     Right hip: No tenderness. Normal range of motion.     Left hip: No tenderness. Normal range of motion.     Right upper leg: No edema.     Left upper leg: Tenderness (Posterior over hamstring as well as medially) present. No swelling or edema.     Left knee: Normal. No tenderness.     Left lower leg: No tenderness.     Left ankle: Normal range of motion.  Skin:    General: Skin is warm and dry.  Neurological:     Mental Status: He is alert.     Sensory: No sensory deficit.     Comments: Motor, sensation, and vascular distal to the injury is  fully intact.  Patient is able to get up from a sitting position and stands but does so very slowly protecting his right leg.  He is able to bear weight.     ED Results / Procedures / Treatments   Labs (all labs ordered are listed, but only abnormal results are displayed) Labs Reviewed - No data to display  EKG None  Radiology No results found.  Procedures Procedures (  including critical care time)  Medications Ordered in ED Medications  ketorolac (TORADOL) injection 30 mg (has no administration in time range)    ED Course  I have reviewed the triage vital signs and the nursing notes.  Pertinent labs & imaging results that were available during my care of the patient were reviewed by me and considered in my medical decision making (see chart for details).  Patient seen and examined. Medications ordered.  Patient with history suggestive of hamstring injury.  Patient is ambulatory but very slowly.  No neurovascular compromise in the leg.  At this point will give a set of crutches to help with ambulation and rest, muscle relaxer, IM Toradol here, continue Mobic at home which is previously prescribed.  Vital signs reviewed and are as follows: BP (!) 181/96 (BP Location: Left Arm)   Pulse 88   Temp 97.8 F (36.6 C) (Oral)   Resp 18   SpO2 99%   Encourage PCP follow-up in 1 to 2 weeks if he continues to have significant difficulty with walking.  Work note written.     MDM Rules/Calculators/A&P                       Hamstring injury suspected.  Lower extremities neurovascularly intact.  No back pain.  This is not seem to be radicular in nature.  No red flags.   Final Clinical Impression(s) / ED Diagnoses Final diagnoses:  Right hamstring injury, initial encounter    Rx / DC Orders ED Discharge Orders         Ordered    methocarbamol (ROBAXIN) 500 MG tablet  4 times daily     04/07/20 1034           Renne Crigler, PA-C 04/07/20 1042    Tilden Fossa,  MD 04/07/20 1527

## 2020-04-07 NOTE — Discharge Instructions (Signed)
Please read and follow all provided instructions.  Your diagnoses today include:  1. Right hamstring injury, initial encounter     Tests performed today include:  Vital signs. See below for your results today.   Medications prescribed:   Robaxin (methocarbamol) - muscle relaxer medication  DO NOT drive or perform any activities that require you to be awake and alert because this medicine can make you drowsy.   Take any prescribed medications only as directed.  Home care instructions:   Follow any educational materials contained in this packet  It looks like you already taking Mobic, also called meloxicam, for pain and inflammation.  Continue this.   Follow R.I.C.E. Protocol:  R - rest your injury   I  - use ice on injury without applying directly to skin  C - compress injury with bandage or splint  E - elevate the injury as much as possible  Follow-up instructions: Please follow-up with your primary care provider if you continue to have significant pain in 1 week. In this case you may have a more severe injury that requires further care.   Return instructions:   Please return if your toes or feet are numb or tingling, appear gray or blue, or you have severe pain (also elevate the leg and loosen splint or wrap if you were given one)  Please return to the Emergency Department if you experience worsening symptoms.   Please return if you have any other emergent concerns.  Additional Information:  Your vital signs today were: BP (!) 181/96 (BP Location: Left Arm)   Pulse 88   Temp 97.8 F (36.6 C) (Oral)   Resp 18   SpO2 99%  If your blood pressure (BP) was elevated above 135/85 this visit, please have this repeated by your doctor within one month. -------------- If prescribed crutches for your injury: use crutches as needed for the first few days. Then, you may walk as the pain allows, or as instructed. Start gradually with weight bearing on the affected side. Once  you can walk pain free, then try jogging. When you can run forwards, then you can try moving side-to-side. If you cannot walk without crutches in one week, you need a re-check. --------------

## 2020-07-22 ENCOUNTER — Encounter (HOSPITAL_COMMUNITY): Payer: Self-pay | Admitting: *Deleted

## 2020-07-22 ENCOUNTER — Other Ambulatory Visit: Payer: Self-pay

## 2020-07-22 ENCOUNTER — Inpatient Hospital Stay (HOSPITAL_COMMUNITY)
Admission: EM | Admit: 2020-07-22 | Discharge: 2020-07-26 | DRG: 309 | Disposition: A | Discharge status: Home / Self Care | Payer: Self-pay | Attending: Internal Medicine | Admitting: Internal Medicine

## 2020-07-22 ENCOUNTER — Observation Stay (HOSPITAL_COMMUNITY): Payer: Self-pay

## 2020-07-22 ENCOUNTER — Emergency Department (HOSPITAL_COMMUNITY): Payer: Self-pay

## 2020-07-22 DIAGNOSIS — I4892 Unspecified atrial flutter: Secondary | ICD-10-CM

## 2020-07-22 DIAGNOSIS — Z20822 Contact with and (suspected) exposure to covid-19: Secondary | ICD-10-CM | POA: Diagnosis present

## 2020-07-22 DIAGNOSIS — K449 Diaphragmatic hernia without obstruction or gangrene: Secondary | ICD-10-CM | POA: Diagnosis present

## 2020-07-22 DIAGNOSIS — I483 Typical atrial flutter: Principal | ICD-10-CM | POA: Diagnosis present

## 2020-07-22 DIAGNOSIS — N183 Chronic kidney disease, stage 3 unspecified: Secondary | ICD-10-CM | POA: Diagnosis present

## 2020-07-22 DIAGNOSIS — M1990 Unspecified osteoarthritis, unspecified site: Secondary | ICD-10-CM | POA: Diagnosis present

## 2020-07-22 DIAGNOSIS — K2980 Duodenitis without bleeding: Secondary | ICD-10-CM | POA: Diagnosis present

## 2020-07-22 DIAGNOSIS — I248 Other forms of acute ischemic heart disease: Secondary | ICD-10-CM | POA: Diagnosis present

## 2020-07-22 DIAGNOSIS — I10 Essential (primary) hypertension: Secondary | ICD-10-CM

## 2020-07-22 DIAGNOSIS — I129 Hypertensive chronic kidney disease with stage 1 through stage 4 chronic kidney disease, or unspecified chronic kidney disease: Secondary | ICD-10-CM | POA: Diagnosis present

## 2020-07-22 DIAGNOSIS — Z794 Long term (current) use of insulin: Secondary | ICD-10-CM

## 2020-07-22 DIAGNOSIS — F1410 Cocaine abuse, uncomplicated: Secondary | ICD-10-CM | POA: Diagnosis present

## 2020-07-22 DIAGNOSIS — I441 Atrioventricular block, second degree: Secondary | ICD-10-CM | POA: Diagnosis present

## 2020-07-22 DIAGNOSIS — K2210 Ulcer of esophagus without bleeding: Secondary | ICD-10-CM | POA: Diagnosis present

## 2020-07-22 DIAGNOSIS — Z8616 Personal history of COVID-19: Secondary | ICD-10-CM

## 2020-07-22 DIAGNOSIS — Z79899 Other long term (current) drug therapy: Secondary | ICD-10-CM

## 2020-07-22 DIAGNOSIS — R002 Palpitations: Secondary | ICD-10-CM | POA: Diagnosis present

## 2020-07-22 DIAGNOSIS — K439 Ventral hernia without obstruction or gangrene: Secondary | ICD-10-CM | POA: Diagnosis present

## 2020-07-22 DIAGNOSIS — E119 Type 2 diabetes mellitus without complications: Secondary | ICD-10-CM

## 2020-07-22 DIAGNOSIS — E1165 Type 2 diabetes mellitus with hyperglycemia: Secondary | ICD-10-CM | POA: Diagnosis present

## 2020-07-22 DIAGNOSIS — E1122 Type 2 diabetes mellitus with diabetic chronic kidney disease: Secondary | ICD-10-CM | POA: Diagnosis present

## 2020-07-22 DIAGNOSIS — K2100 Gastro-esophageal reflux disease with esophagitis, without bleeding: Secondary | ICD-10-CM | POA: Diagnosis present

## 2020-07-22 DIAGNOSIS — R1013 Epigastric pain: Secondary | ICD-10-CM

## 2020-07-22 DIAGNOSIS — K209 Esophagitis, unspecified without bleeding: Secondary | ICD-10-CM

## 2020-07-22 HISTORY — DX: Unspecified atrial flutter: I48.92

## 2020-07-22 LAB — I-STAT VENOUS BLOOD GAS, ED
Acid-base deficit: 5 mmol/L — ABNORMAL HIGH (ref 0.0–2.0)
Bicarbonate: 18.7 mmol/L — ABNORMAL LOW (ref 20.0–28.0)
Calcium, Ion: 1.11 mmol/L — ABNORMAL LOW (ref 1.15–1.40)
HCT: 42 % (ref 39.0–52.0)
Hemoglobin: 14.3 g/dL (ref 13.0–17.0)
O2 Saturation: 90 %
Potassium: 3.5 mmol/L (ref 3.5–5.1)
Sodium: 140 mmol/L (ref 135–145)
TCO2: 20 mmol/L — ABNORMAL LOW (ref 22–32)
pCO2, Ven: 29.7 mmHg — ABNORMAL LOW (ref 44.0–60.0)
pH, Ven: 7.409 (ref 7.250–7.430)
pO2, Ven: 56 mmHg — ABNORMAL HIGH (ref 32.0–45.0)

## 2020-07-22 LAB — URINALYSIS, ROUTINE W REFLEX MICROSCOPIC
Bacteria, UA: NONE SEEN
Bilirubin Urine: NEGATIVE
Glucose, UA: >=500 mg/dL — AB
Ketones, ur: 5 mg/dL — AB
Leukocytes,Ua: NEGATIVE
Nitrite: NEGATIVE
Protein, ur: 100 mg/dL — AB
Specific Gravity, Urine: 1.02 (ref 1.005–1.030)
pH: 6 (ref 5.0–8.0)

## 2020-07-22 LAB — RAPID URINE DRUG SCREEN, HOSP PERFORMED
Amphetamines: NOT DETECTED
Barbiturates: NOT DETECTED
Benzodiazepines: NOT DETECTED
Cocaine: POSITIVE — AB
Opiates: NOT DETECTED
Tetrahydrocannabinol: NOT DETECTED

## 2020-07-22 LAB — BASIC METABOLIC PANEL
Anion gap: 18 — ABNORMAL HIGH (ref 5–15)
BUN: 11 mg/dL (ref 6–20)
CO2: 19 mmol/L — ABNORMAL LOW (ref 22–32)
Calcium: 9.5 mg/dL (ref 8.9–10.3)
Chloride: 101 mmol/L (ref 98–111)
Creatinine, Ser: 1.55 mg/dL — ABNORMAL HIGH (ref 0.61–1.24)
GFR calc Af Amer: 57 mL/min — ABNORMAL LOW (ref >60)
GFR calc non Af Amer: 49 mL/min — ABNORMAL LOW (ref >60)
Glucose, Bld: 300 mg/dL — ABNORMAL HIGH (ref 70–99)
Potassium: 3.8 mmol/L (ref 3.5–5.1)
Sodium: 138 mmol/L (ref 135–145)

## 2020-07-22 LAB — CBC
HCT: 48.9 % (ref 39.0–52.0)
Hemoglobin: 15.8 g/dL (ref 13.0–17.0)
MCH: 27 pg (ref 26.0–34.0)
MCHC: 32.3 g/dL (ref 30.0–36.0)
MCV: 83.4 fL (ref 80.0–100.0)
Platelets: 254 10*3/uL (ref 150–400)
RBC: 5.86 MIL/uL — ABNORMAL HIGH (ref 4.22–5.81)
RDW: 15 % (ref 11.5–15.5)
WBC: 8.5 10*3/uL (ref 4.0–10.5)
nRBC: 0 % (ref 0.0–0.2)

## 2020-07-22 LAB — HIV ANTIBODY (ROUTINE TESTING W REFLEX): HIV Screen 4th Generation wRfx: NONREACTIVE

## 2020-07-22 LAB — MAGNESIUM: Magnesium: 1.7 mg/dL (ref 1.7–2.4)

## 2020-07-22 LAB — TSH: TSH: 2.574 u[IU]/mL (ref 0.350–4.500)

## 2020-07-22 LAB — TROPONIN I (HIGH SENSITIVITY)
Troponin I (High Sensitivity): 17 ng/L (ref <18)
Troponin I (High Sensitivity): 21 ng/L — ABNORMAL HIGH (ref <18)
Troponin I (High Sensitivity): 21 ng/L — ABNORMAL HIGH (ref <18)

## 2020-07-22 LAB — ECHOCARDIOGRAM COMPLETE
Height: 69 in
S' Lateral: 2 cm
Weight: 3568 oz

## 2020-07-22 LAB — HEMOGLOBIN A1C
Hgb A1c MFr Bld: 12.8 % — ABNORMAL HIGH (ref 4.8–5.6)
Mean Plasma Glucose: 320.66 mg/dL

## 2020-07-22 LAB — GLUCOSE, CAPILLARY: Glucose-Capillary: 318 mg/dL — ABNORMAL HIGH (ref 70–99)

## 2020-07-22 LAB — BETA-HYDROXYBUTYRIC ACID: Beta-Hydroxybutyric Acid: 0.29 mmol/L — ABNORMAL HIGH (ref 0.05–0.27)

## 2020-07-22 LAB — SARS CORONAVIRUS 2 BY RT PCR (HOSPITAL ORDER, PERFORMED IN ~~LOC~~ HOSPITAL LAB): SARS Coronavirus 2: NEGATIVE

## 2020-07-22 MED ORDER — LACTATED RINGERS IV BOLUS
1000.0000 mL | Freq: Once | INTRAVENOUS | Status: AC
  Administered 2020-07-22: 1000 mL via INTRAVENOUS

## 2020-07-22 MED ORDER — ALUM & MAG HYDROXIDE-SIMETH 200-200-20 MG/5ML PO SUSP
30.0000 mL | ORAL | Status: DC | PRN
  Administered 2020-07-22 – 2020-07-24 (×8): 30 mL via ORAL
  Filled 2020-07-22 (×8): qty 30

## 2020-07-22 MED ORDER — SODIUM CHLORIDE 0.9 % IV SOLN: 250.0000 mL | INTRAVENOUS | Status: DC | PRN

## 2020-07-22 MED ORDER — LABETALOL HCL 5 MG/ML IV SOLN
10.0000 mg | Freq: Once | INTRAVENOUS | Status: AC
  Administered 2020-07-22: 10 mg via INTRAVENOUS
  Filled 2020-07-22: qty 4

## 2020-07-22 MED ORDER — NITROGLYCERIN 0.4 MG SL SUBL
0.4000 mg | SUBLINGUAL_TABLET | SUBLINGUAL | Status: DC | PRN
  Filled 2020-07-22: qty 1

## 2020-07-22 MED ORDER — SODIUM CHLORIDE 0.9% FLUSH
3.0000 mL | Freq: Two times a day (BID) | INTRAVENOUS | Status: DC
  Administered 2020-07-23 – 2020-07-25 (×3): 3 mL via INTRAVENOUS

## 2020-07-22 MED ORDER — ALUM & MAG HYDROXIDE-SIMETH 200-200-20 MG/5ML PO SUSP
30.0000 mL | Freq: Once | ORAL | Status: AC
  Administered 2020-07-22: 30 mL via ORAL
  Filled 2020-07-22: qty 30

## 2020-07-22 MED ORDER — ALUM & MAG HYDROXIDE-SIMETH 200-200-20 MG/5ML PO SUSP
30.0000 mL | Freq: Once | ORAL | Status: AC
  Administered 2020-07-22: 30 mL via ORAL

## 2020-07-22 MED ORDER — SODIUM CHLORIDE 0.9% FLUSH
3.0000 mL | INTRAVENOUS | Status: DC | PRN
  Administered 2020-07-25: 3 mL via INTRAVENOUS

## 2020-07-22 MED ORDER — DILTIAZEM HCL-DEXTROSE 125-5 MG/125ML-% IV SOLN (PREMIX)
5.0000 mg/h | INTRAVENOUS | Status: DC
  Administered 2020-07-22: 10 mg/h via INTRAVENOUS
  Administered 2020-07-22: 5 mg/h via INTRAVENOUS
  Filled 2020-07-22 (×3): qty 125

## 2020-07-22 MED ORDER — ADENOSINE 6 MG/2ML IV SOLN
6.0000 mg | Freq: Once | INTRAVENOUS | Status: AC
  Administered 2020-07-22: 6 mg via INTRAVENOUS
  Filled 2020-07-22: qty 2

## 2020-07-22 MED ORDER — ACETAMINOPHEN 325 MG PO TABS: 650.0000 mg | ORAL_TABLET | ORAL | Status: DC | PRN

## 2020-07-22 MED ORDER — DILTIAZEM LOAD VIA INFUSION
10.0000 mg | Freq: Once | INTRAVENOUS | Status: AC
  Administered 2020-07-22: 10 mg via INTRAVENOUS
  Filled 2020-07-22: qty 10

## 2020-07-22 MED ORDER — INSULIN ASPART 100 UNIT/ML ~~LOC~~ SOLN
0.0000 [IU] | Freq: Every day | SUBCUTANEOUS | Status: DC
  Administered 2020-07-22: 4 [IU] via SUBCUTANEOUS
  Administered 2020-07-23 – 2020-07-25 (×3): 3 [IU] via SUBCUTANEOUS

## 2020-07-22 MED ORDER — ONDANSETRON HCL 4 MG/2ML IJ SOLN: 4.0000 mg | Freq: Four times a day (QID) | INTRAMUSCULAR | Status: DC | PRN

## 2020-07-22 MED ORDER — LORAZEPAM 2 MG/ML IJ SOLN
1.0000 mg | Freq: Once | INTRAMUSCULAR | Status: AC
  Administered 2020-07-22: 1 mg via INTRAVENOUS
  Filled 2020-07-22: qty 1

## 2020-07-22 MED ORDER — ZOLPIDEM TARTRATE 5 MG PO TABS: 5.0000 mg | ORAL_TABLET | Freq: Every evening | ORAL | Status: DC | PRN

## 2020-07-22 MED ORDER — APIXABAN 5 MG PO TABS
5.0000 mg | ORAL_TABLET | Freq: Two times a day (BID) | ORAL | Status: DC
  Administered 2020-07-22 – 2020-07-24 (×5): 5 mg via ORAL
  Filled 2020-07-22 (×6): qty 1

## 2020-07-22 MED ORDER — LABETALOL HCL 5 MG/ML IV SOLN
5.0000 mg | INTRAVENOUS | Status: DC | PRN
  Administered 2020-07-22 (×2): 5 mg via INTRAVENOUS
  Filled 2020-07-22: qty 4

## 2020-07-22 MED ORDER — ALPRAZOLAM 0.25 MG PO TABS: 0.2500 mg | ORAL_TABLET | Freq: Two times a day (BID) | ORAL | Status: DC | PRN

## 2020-07-22 MED ORDER — LIDOCAINE VISCOUS HCL 2 % MT SOLN
15.0000 mL | Freq: Once | OROMUCOSAL | Status: AC
  Administered 2020-07-22: 15 mL via ORAL
  Filled 2020-07-22: qty 15

## 2020-07-22 MED ORDER — INSULIN ASPART 100 UNIT/ML ~~LOC~~ SOLN
0.0000 [IU] | Freq: Three times a day (TID) | SUBCUTANEOUS | Status: DC
  Administered 2020-07-23: 4 [IU] via SUBCUTANEOUS
  Administered 2020-07-23: 11 [IU] via SUBCUTANEOUS
  Administered 2020-07-23: 20 [IU] via SUBCUTANEOUS
  Administered 2020-07-24 (×2): 7 [IU] via SUBCUTANEOUS
  Administered 2020-07-25: 11 [IU] via SUBCUTANEOUS
  Administered 2020-07-25: 4 [IU] via SUBCUTANEOUS
  Administered 2020-07-26: 11 [IU] via SUBCUTANEOUS

## 2020-07-22 NOTE — ED Triage Notes (Signed)
To ED for eval of dizziness, diaphoresis, cp, tachy for past few days. Pt diaphoretic in triage. States he hasn't had his bp meds and was picking them up today.

## 2020-07-22 NOTE — ED Notes (Signed)
[phone] beverly sister would like an update please

## 2020-07-22 NOTE — Consult Note (Addendum)
Cardiology Consultation:   Patient ID: Ryan [name]; [id number]; 21-Mar-1961   Admit date: 07/22/2020 Date of Consult: 07/22/2020  Primary Care Provider: Lavinia Sharps, NP Primary Cardiologist: Chrystie Nose, MD new Primary Electrophysiologist:  None   Patient Profile:   Ryan [name] is a 59 y.o. male with a hx of GERD, DM, HTN, CP 2nd cocaine, CKD, drug use, Covid infection 05/2019, who is being seen today for the evaluation of atrial flutter at the request of Dr Jeraldine Loots.  History of Present Illness:   Ryan [name] does not see doctors on a regular basis.  He has infrequent ER visits for different things.  He has gotten medications at the [facility], but has been out of his reflux medication for several weeks.  About 3 days ago, he noticed his heart start to race.  It has been constant since then.  He was already having problems with reflux, but also had some additional chest pain.  He did not feel short of breath with this.  However, he has been lightheaded and dizzy.  No syncope.  He complains of general malaise, just not feeling well did not feel like he could do anything.  He says he has done cocaine in the past but has not done it in the last few days.  Finally, it got so bad that he came to the emergency room.  In the emergency room, he keeps asking [place] to slow his heart rate down so he will feel better.  He is also currently containing of burning chest pain that he says is his reflux.  He has multiple prescriptions on his med list, but it is not clear what he is actually taking.   Past Medical History:  Diagnosis Date  . Achilles tendon rupture right  . Arthritis    back and shoulders  . Atrial flutter with rapid ventricular response ([facility]) 07/22/2020  . Chest pain    due to cocaine use  . Diabetes mellitus without complication ([facility])   . Drug abuse ([facility])    cocaine, marijuana abuse  . GERD (gastroesophageal reflux disease)   .  Hypertension   . Renal insufficiency     Past Surgical History:  Procedure Laterality Date  . ACHILLES TENDON SURGERY Right 05/02/2014   Procedure: RIGHT ACHILLES TENDON REPAIR;  Surgeon: Javier Docker, MD;  Location: [facility];  Service: Orthopedics;  Laterality: Right;  . CARDIAC CATHETERIZATION  05.31.2012   showing patent coronaries with no visualized vasospasm and EF of 55-60 %  . ORBITAL FRACTURE SURGERY Left 2-3 yrs ago     Prior to Admission medications   Medication Sig Start Date End Date Taking? Authorizing Provider  amLODipine (NORVASC) 10 MG tablet Take 1 tablet (10 mg total) by mouth every morning. Resume in 1 week 06/07/19  Yes Elgergawy, Leana Roe, MD  carvedilol (COREG) 25 MG tablet Take 25 mg by mouth 2 (two) times daily. 03/03/20  Yes [provider]  insulin NPH-regular Human (NOVOLIN 70/30) (70-30) 100 UNIT/ML injection Take a directed. Patient taking differently: Inject 20 Units into the skin 2 (two) times daily with a meal. Take a directed. 12/21/17  Yes Hagler, Arlys John, MD  lisinopril-hydrochlorothiazide (ZESTORETIC) 20-25 MG tablet Take 1 tablet by mouth every morning. Please resume in 1 week Patient taking differently: Take 1 tablet by mouth every morning.  06/07/19  Yes Elgergawy, Leana Roe, MD  pantoprazole (PROTONIX) 20 MG tablet Take 1 tablet (20 mg total) by mouth 2 (two) times daily.  Patient taking differently: Take 20 mg by mouth daily.  02/19/19  Yes Raeford Razor, MD  potassium chloride 20 MEQ TBCR Take 20 mEq by mouth daily. 05/28/19  Yes Hedges, Tinnie Gens, PA-C  SEROQUEL 50 MG tablet Take 50 mg by mouth at bedtime. 03/03/20  Yes [provider]  ZYRTEC ALLERGY 10 MG tablet Take 10 mg by mouth at bedtime. 03/03/20  Yes [provider]  cyclobenzaprine (FLEXERIL) 10 MG tablet Take 1 tablet (10 mg total) by mouth at bedtime. 03/25/20   Placido Sou, PA-C  famotidine (PEPCID) 20 MG tablet Take 1 tablet (20 mg total) by mouth 2 (two) times daily  for 5 days. Patient taking differently: Take 20 mg by mouth daily.  05/01/19 03/25/20  Joy, Shawn C, PA-C  methocarbamol (ROBAXIN) 500 MG tablet Take 2 tablets (1,000 mg total) by mouth 4 (four) times daily. Patient not taking: Reported on 07/22/2020 04/07/20   Renne Crigler, PA-C  MOBIC 15 MG tablet Take 15 mg by mouth daily. 03/03/20   [provider]    Inpatient Medications: Scheduled Meds: . alum & mag hydroxide-simeth  30 mL Oral Once  . alum & mag hydroxide-simeth  30 mL Oral Once   And  . lidocaine  15 mL Oral Once   Continuous Infusions: . diltiazem (CARDIZEM) infusion 5 mg/hr (07/22/20 1142)   PRN Meds: labetalol  Allergies:   No Known Allergies  Social History:   Social History   Socioeconomic History  . Marital status: Single    Spouse name: Not on file  . Number of children: Not on file  . Years of education: Not on file  . Highest education level: Not on file  Occupational History  . Not on file  Tobacco Use  . Smoking status: Never Smoker  . Smokeless tobacco: Never Used  Substance and Sexual Activity  . Alcohol use: Yes    Alcohol/week: 14.0 standard drinks    Types: 14 Cans of beer per week    Comment: occassional  . Drug use: Yes    Types: Cocaine, Marijuana    Comment: last cocaine use 2 to 3 yrs ago, last marijuana use 4 to 5 years ago  . Sexual activity: Not on file  Other Topics Concern  . Not on file  Social History Narrative   ** Merged History Encounter **       Pt currently lives with his brother, and his sister in [place]. He is employed by [employer]. He denies tobacco use. He does endorse occasional alcohol use. He does endorse cocaine use.  Family history : Non contributory   Social Determinants of Corporate investment banker Strain:   . Difficulty of Paying Living Expenses:   Food Insecurity:   . Worried About Programme researcher, broadcasting/film/video in the Last Year:   . Barista in the Last Year:   Transportation Needs:   .  Freight forwarder (Medical):   Marland Kitchen Lack of Transportation (Non-Medical):   Physical Activity:   . Days of Exercise per Week:   . Minutes of Exercise per Session:   Stress:   . Feeling of Stress :   Social Connections:   . Frequency of Communication with Friends and Family:   . Frequency of Social Gatherings with Friends and Family:   . Attends Religious Services:   . Active Member of Clubs or Organizations:   . Attends Banker Meetings:   Marland Kitchen Marital Status:   Intimate Programme researcher, broadcasting/film/video  Violence:   . Fear of Current or Ex-Partner:   . Emotionally Abused:   Marland Kitchen Physically Abused:   . Sexually Abused:     Family History:   Family History  Problem Relation Age of Onset  . Unexplained death Mother 34       She died in 04/11/16, he does not remember what she died of  . Unexplained death Father        He has no information on his father   Family Status:  Family Status  Relation Name Status  . Mother  Deceased  . Father  Deceased    ROS:  Please see the history of present illness.  All other ROS reviewed and negative.     Physical Exam/Data:   Vitals:   07/22/20 0915 07/22/20 0930 07/22/20 1015 07/22/20 1030  BP: (!) 123/100 (!) 129/91 (!) 120/101 110/84  Pulse: (!) 153 (!) 154 (!) 153 (!) 153  Resp: (!) 29 (!) 31 (!) 29 (!) 22  Temp:      TempSrc:      SpO2: 96% 95% 95% 96%  Weight:      Height:        Intake/Output Summary (Last 24 hours) at 07/22/2020 1205 Last data filed at 07/22/2020 1111 Gross per 24 hour  Intake 1000 ml  Output --  Net 1000 ml    Last 3 Weights 07/22/2020 07/13/2019 05/31/2019  Weight (lbs) 223 lb 230 lb 240 lb  Weight (kg) 101.152 kg 104.327 kg 108.863 kg     Body mass index is 32.93 kg/m.   General:  Well nourished, well developed, male in no acute distress HEENT: normal Lymph: no adenopathy Neck: JVD -not elevated Endocrine:  No thryomegaly Vascular: No carotid bruits; 4/4 extremity pulses 2+  Cardiac:  normal S1, S2; rapid but  regular rate and rhythm; no murmur Lungs:  clear bilaterally, no wheezing, rhonchi or rales  Abd: soft, nontender, no hepatomegaly  Ext: no edema Musculoskeletal:  No deformities, BUE and BLE strength normal and equal Skin: warm and dry  Neuro:  CNs 2-12 intact, no focal abnormalities noted Psych:  Normal affect   EKG:  The EKG was personally reviewed and demonstrates: Atrial flutter, RVR, heart rate 161, flutter waves could the appearance of ST depression Telemetry:  Telemetry was personally reviewed and demonstrates: Atrial flutter, heart rate greater than 150   CV studies:   None  Laboratory Data:   Chemistry Recent Labs  Lab 07/22/20 0900 07/22/20 1101  NA 138 140  K 3.8 3.5  CL 101  --   CO2 19*  --   GLUCOSE 300*  --   BUN 11  --   CREATININE 1.55*  --   CALCIUM 9.5  --   GFRNONAA 49*  --   GFRAA 57*  --   ANIONGAP 18*  --     Lab Results  Component Value Date   ALT 17 07/12/2019   AST 24 07/12/2019   ALKPHOS 75 07/12/2019   BILITOT 0.7 07/12/2019   Hematology Recent Labs  Lab 07/22/20 0900 07/22/20 1101  WBC 8.5  --   RBC 5.86*  --   HGB 15.8 14.3  HCT 48.9 42.0  MCV 83.4  --   MCH 27.0  --   MCHC 32.3  --   RDW 15.0  --   PLT 254  --    Cardiac Enzymes High Sensitivity Troponin:   Recent Labs  Lab 07/22/20 0900  TROPONINIHS 17  BNPNo results for input(s): BNP, PROBNP in the last 168 hours.  DDimer No results for input(s): DDIMER in the last 168 hours. TSH:  Lab Results  Component Value Date   TSH 1.389 05/12/2011   Lipids: Lab Results  Component Value Date   CHOL 145 05/13/2011   HDL 77 05/13/2011   LDLCALC  05/13/2011    55        Total Cholesterol/HDL:CHD Risk Coronary Heart Disease Risk Table                     Men   Women  1/2 Average Risk   3.4   3.3  Average Risk       5.0   4.4  2 X Average Risk   9.6   7.1  3 X Average Risk  23.4   11.0        Use the calculated Patient Ratio above and the CHD Risk Table to  determine the patient's CHD Risk.        ATP III CLASSIFICATION (LDL):  <100     mg/dL   Optimal  [id number]  mg/dL   Near or Above                    Optimal  130-159  mg/dL   Borderline  [id number]  mg/dL   High  >478>190     mg/dL   Very High   TRIG 65 29/56/213005/31/2012   CHOLHDL 1.9 05/13/2011   HgbA1c: Lab Results  Component Value Date   HGBA1C (H) 05/12/2011    6.7 (NOTE)                                                                       According to the ADA Clinical Practice Recommendations for 2011, when HbA1c is used as a screening test:   >=6.5%   Diagnostic of Diabetes Mellitus           (if abnormal result  is confirmed)  5.7-6.4%   Increased risk of developing Diabetes Mellitus  References:Diagnosis and Classification of Diabetes Mellitus,Diabetes Care,2011,34(Suppl 1):S62-S69 and Standards of Medical Care in         Diabetes - 2011,Diabetes Care,2011,34  (Suppl 1):S11-S61.   Magnesium:  Magnesium  Date Value Ref Range Status  07/22/2020 1.7 1.7 - 2.4 mg/dL Final    Comment:    Performed at [facility], [address] [place], [place] [address]   Drugs of Abuse     Component Value Date/Time   LABOPIA NONE DETECTED 05/31/2019 1200   COCAINSCRNUR POSITIVE (A) 05/31/2019 1200   LABBENZ NONE DETECTED 05/31/2019 1200   AMPHETMU NONE DETECTED 05/31/2019 1200   THCU NONE DETECTED 05/31/2019 1200   LABBARB NONE DETECTED 05/31/2019 1200     Radiology/Studies:  DG Chest Port 1 View  Result Date: 07/22/2020 CLINICAL DATA:  Chest pain with cardiac palpitations EXAM: PORTABLE CHEST 1 VIEW COMPARISON:  May 29, 2019 FINDINGS: Lungs are clear. Heart is borderline enlarged with pulmonary vascularity normal. No adenopathy. No bone lesions. IMPRESSION: Stable cardiac prominence.  Lungs clear.  No evident adenopathy. Electronically Signed   By: Bretta BangWilliam  Woodruff III M.D.   On: 07/22/2020 09:34  Assessment and Plan:   1. Atrial flutter, RVR - unknown duration, rapid rate - no BB  w/ hx cocaine - has been started on IV Cardizem, continue this and increase as BP/HR will tolerate - start anticoag, discuss oral vs heparin w/ MD - CHA2DS2-VASc =1 (HTN) - Start Eliquis - ck TSH  2. HTN - BP improved w/ Cardizem, convert to po when in SR  3. Hx Cocaine use - ck UDS   Principal Problem:   Atrial flutter with rapid ventricular response (HCC)     For questions or updates, please contact [facility] Please consult [email or web address] for contact info under Cardiology/STEMI.   Melida Quitter, PA-C  07/22/2020 12:05 PM

## 2020-07-22 NOTE — Discharge Instructions (Addendum)
Medication Changes: - STOP taking the blood pressure medications you have at home and START Cardizem CD 240mg  daily and Losartan 25mg  daily instead. - START Eliquis 5mg  twice daily. This is the blood thinner that helps prevent you from having a stroke. - STOP Pepcid and START Protonix 40mg  twice daily and Sucralfate (Carafate) 1g twice daily for 2 months. After 2 months, then take Protonix 40mg  once daily. - INCREASE home Insulin to 35 units twice daily. Please check your blood sugar at least 3 times per day. - Please take all other medications as directed elsewhere discharge paperwork.   Please call to schedule follow-up with your primary care provider at [facility] and with GI.  You have follow-up in our [place] office scheduled for 08/11/2020 at 10:45am.  Information on my medicine - ELIQUIS (apixaban)  This medication education was reviewed with me or my healthcare representative as part of my discharge preparation.   Why was Eliquis prescribed for you? Eliquis was prescribed for you to reduce the risk of a blood clot forming that can cause a stroke if you have a medical condition called atrial fibrillation (a type of irregular heartbeat).  What do You need to know about Eliquis ? Take your Eliquis TWICE DAILY - one tablet in the morning and one tablet in the evening with or without food. If you have difficulty swallowing the tablet whole please discuss with your pharmacist how to take the medication safely.  Take Eliquis exactly as prescribed by your doctor and DO NOT stop taking Eliquis without talking to the doctor who prescribed the medication.  Stopping may increase your risk of developing a stroke.  Refill your prescription before you run out.  After discharge, you should have regular check-up appointments with your healthcare provider that is prescribing your Eliquis.  In the future your dose may need to be changed if your kidney function or weight changes  by a significant amount or as you get older.  What do you do if you miss a dose? If you miss a dose, take it as soon as you remember on the same day and resume taking twice daily.  Do not take more than one dose of ELIQUIS at the same time to make up a missed dose.  Important Safety Information A possible side effect of Eliquis is bleeding. You should call your healthcare provider right away if you experience any of the following: ? Bleeding from an injury or your nose that does not stop. ? Unusual colored urine (red or dark brown) or unusual colored stools (red or black). ? Unusual bruising for unknown reasons. ? A serious fall or if you hit your head (even if there is no bleeding).  Some medicines may interact with Eliquis and might increase your risk of bleeding or clotting while on Eliquis. To help avoid this, consult your healthcare provider or pharmacist prior to using any new prescription or non-prescription medications, including herbals, vitamins, non-steroidal anti-inflammatory drugs (NSAIDs) and supplements.  This website has more information on Eliquis (apixaban): http://www.eliquis.com/eliquis/home

## 2020-07-22 NOTE — Progress Notes (Signed)
  Echocardiogram 2D Echocardiogram has been performed.  Ryan [name] 07/22/2020, 3:09 PM

## 2020-07-22 NOTE — H&P (Signed)
Admission History and Physical:   Patient ID: Ryan [name]; [id number]; 05-29-1961   Admit date: 07/22/2020 Date of Consult: 07/22/2020  Primary Care Provider: Lavinia Sharps, NP Primary Cardiologist: Chrystie Nose, MD new Primary Electrophysiologist:  None   Patient Profile:   Ryan [name] is a 59 y.o. male with a hx of GERD, DM, HTN, CP 2nd cocaine, CKD, drug use, Covid infection 05/2019, who is being seen today for the evaluation of atrial flutter at the request of Dr Jeraldine Loots.  History of Present Illness:   Ryan [name] does not see doctors on a regular basis.  He has infrequent ER visits for different things.  He has gotten medications at the [facility], but has been out of his reflux medication for several weeks.  About 3 days ago, he noticed his heart start to race.  It has been constant since then.  He was already having problems with reflux, but also had some additional chest pain.  He did not feel short of breath with this.  However, he has been lightheaded and dizzy.  No syncope.  He complains of general malaise, just not feeling well did not feel like he could do anything.  He says he has done cocaine in the past but has not done it in the last few days.  Finally, it got so bad that he came to the emergency room.  In the emergency room, he keeps asking [place] to slow his heart rate down so he will feel better.  He is also currently containing of burning chest pain that he says is his reflux.  He has multiple prescriptions on his med list, but it is not clear what he is actually taking.   Past Medical History:  Diagnosis Date  . Achilles tendon rupture right  . Arthritis    back and shoulders  . Atrial flutter with rapid ventricular response (HCC) 07/22/2020  . Chest pain    due to cocaine use  . Diabetes mellitus without complication (HCC)   . Drug abuse (HCC)    cocaine, marijuana abuse  . GERD (gastroesophageal reflux disease)   .  Hypertension   . Renal insufficiency     Past Surgical History:  Procedure Laterality Date  . ACHILLES TENDON SURGERY Right 05/02/2014   Procedure: RIGHT ACHILLES TENDON REPAIR;  Surgeon: Javier Docker, MD;  Location: [facility] ORS;  Service: Orthopedics;  Laterality: Right;  . CARDIAC CATHETERIZATION  05.31.2012   showing patent coronaries with no visualized vasospasm and EF of 55-60 %  . ORBITAL FRACTURE SURGERY Left 2-3 yrs ago     Prior to Admission medications   Medication Sig Start Date End Date Taking? Authorizing Provider  amLODipine (NORVASC) 10 MG tablet Take 1 tablet (10 mg total) by mouth every morning. Resume in 1 week 06/07/19  Yes Elgergawy, Leana Roe, MD  carvedilol (COREG) 25 MG tablet Take 25 mg by mouth 2 (two) times daily. 03/03/20  Yes [provider]  insulin NPH-regular Human (NOVOLIN 70/30) (70-30) 100 UNIT/ML injection Take a directed. Patient taking differently: Inject 20 Units into the skin 2 (two) times daily with a meal. Take a directed. 12/21/17  Yes Hagler, Arlys John, MD  lisinopril-hydrochlorothiazide (ZESTORETIC) 20-25 MG tablet Take 1 tablet by mouth every morning. Please resume in 1 week Patient taking differently: Take 1 tablet by mouth every morning.  06/07/19  Yes Elgergawy, Leana Roe, MD  pantoprazole (PROTONIX) 20 MG tablet Take 1 tablet (20 mg total) by mouth 2 (two)  times daily. Patient taking differently: Take 20 mg by mouth daily.  02/19/19  Yes Raeford Razor, MD  potassium chloride 20 MEQ TBCR Take 20 mEq by mouth daily. 05/28/19  Yes Hedges, Tinnie Gens, PA-C  SEROQUEL 50 MG tablet Take 50 mg by mouth at bedtime. 03/03/20  Yes [provider]  ZYRTEC ALLERGY 10 MG tablet Take 10 mg by mouth at bedtime. 03/03/20  Yes [provider]  cyclobenzaprine (FLEXERIL) 10 MG tablet Take 1 tablet (10 mg total) by mouth at bedtime. 03/25/20   Placido Sou, PA-C  famotidine (PEPCID) 20 MG tablet Take 1 tablet (20 mg total) by mouth 2 (two) times daily  for 5 days. Patient taking differently: Take 20 mg by mouth daily.  05/01/19 03/25/20  Joy, Shawn C, PA-C  methocarbamol (ROBAXIN) 500 MG tablet Take 2 tablets (1,000 mg total) by mouth 4 (four) times daily. Patient not taking: Reported on 07/22/2020 04/07/20   Renne Crigler, PA-C  MOBIC 15 MG tablet Take 15 mg by mouth daily. 03/03/20   [provider]    Inpatient Medications: Scheduled Meds: . alum & mag hydroxide-simeth  30 mL Oral Once  . apixaban  5 mg Oral BID  . sodium chloride flush  3 mL Intravenous Q12H   Continuous Infusions: . sodium chloride    . diltiazem (CARDIZEM) infusion 10 mg/hr (07/22/20 1550)   PRN Meds: sodium chloride, acetaminophen, ALPRAZolam, labetalol, nitroGLYCERIN, ondansetron (ZOFRAN) IV, sodium chloride flush, zolpidem  Allergies:   No Known Allergies  Social History:   Social History   Socioeconomic History  . Marital status: Single    Spouse name: Not on file  . Number of children: Not on file  . Years of education: Not on file  . Highest education level: Not on file  Occupational History  . Not on file  Tobacco Use  . Smoking status: Never Smoker  . Smokeless tobacco: Never Used  Substance and Sexual Activity  . Alcohol use: Yes    Alcohol/week: 14.0 standard drinks    Types: 14 Cans of beer per week    Comment: occassional  . Drug use: Yes    Types: Cocaine, Marijuana    Comment: last cocaine use 2 to 3 yrs ago, last marijuana use 4 to 5 years ago  . Sexual activity: Not on file  Other Topics Concern  . Not on file  Social History Narrative   ** Merged History Encounter **       Pt currently lives with his brother, and his sister in [place]. He is employed by [employer]. He denies tobacco use. He does endorse occasional alcohol use. He does endorse cocaine use.  Family history : Non contributory   Social Determinants of Corporate investment banker Strain:   . Difficulty of Paying Living Expenses:   Food Insecurity:    . Worried About Programme researcher, broadcasting/film/video in the Last Year:   . Barista in the Last Year:   Transportation Needs:   . Freight forwarder (Medical):   Marland Kitchen Lack of Transportation (Non-Medical):   Physical Activity:   . Days of Exercise per Week:   . Minutes of Exercise per Session:   Stress:   . Feeling of Stress :   Social Connections:   . Frequency of Communication with Friends and Family:   . Frequency of Social Gatherings with Friends and Family:   . Attends Religious Services:   . Active Member of Clubs or Organizations:   .  Attends Banker Meetings:   Marland Kitchen Marital Status:   Intimate Partner Violence:   . Fear of Current or Ex-Partner:   . Emotionally Abused:   Marland Kitchen Physically Abused:   . Sexually Abused:     Family History:   Family History  Problem Relation Age of Onset  . Unexplained death Mother 13       She died in 04-20-2016, he does not remember what she died of  . Unexplained death Father        He has no information on his father   Family Status:  Family Status  Relation Name Status  . Mother  Deceased  . Father  Deceased    ROS:  Please see the history of present illness.  All other ROS reviewed and negative.     Physical Exam/Data:   Vitals:   07/22/20 1225 07/22/20 1430 07/22/20 1530 07/22/20 1545  BP:  (!) 181/90 (!) 183/98 (!) 151/99  Pulse: 81 (!) 51 (!) 55 (!) 56  Resp: (!) (!) 33  Temp:      TempSrc:      SpO2: 100% 96% 97% 95%  Weight:      Height:        Intake/Output Summary (Last 24 hours) at 07/22/2020 1615 Last data filed at 07/22/2020 1429 Gross per 24 hour  Intake 1000 ml  Output 650 ml  Net 350 ml    Last 3 Weights 07/22/2020 07/13/2019 05/31/2019  Weight (lbs) 223 lb 230 lb 240 lb  Weight (kg) 101.152 kg 104.327 kg 108.863 kg     Body mass index is 32.93 kg/m.   General:  Well nourished, well developed, male in no acute distress HEENT: normal Lymph: no adenopathy Neck: JVD -not elevated Endocrine:  No  thryomegaly Vascular: No carotid bruits; 4/4 extremity pulses 2+  Cardiac:  normal S1, S2; rapid but regular rate and rhythm; no murmur Lungs:  clear bilaterally, no wheezing, rhonchi or rales  Abd: soft, nontender, no hepatomegaly  Ext: no edema Musculoskeletal:  No deformities, BUE and BLE strength normal and equal Skin: warm and dry  Neuro:  CNs 2-12 intact, no focal abnormalities noted Psych:  Normal affect   EKG:  The EKG was personally reviewed and demonstrates: Atrial flutter, RVR, heart rate 161, flutter waves could the appearance of ST depression Telemetry:  Telemetry was personally reviewed and demonstrates: Atrial flutter, heart rate greater than 150   CV studies:   None  Laboratory Data:   Chemistry Recent Labs  Lab 07/22/20 0900 07/22/20 1101  NA 138 140  K 3.8 3.5  CL 101  --   CO2 19*  --   GLUCOSE 300*  --   BUN 11  --   CREATININE 1.55*  --   CALCIUM 9.5  --   GFRNONAA 49*  --   GFRAA 57*  --   ANIONGAP 18*  --     Lab Results  Component Value Date   ALT 17 07/12/2019   AST 24 07/12/2019   ALKPHOS 75 07/12/2019   BILITOT 0.7 07/12/2019   Hematology Recent Labs  Lab 07/22/20 0900 07/22/20 1101  WBC 8.5  --   RBC 5.86*  --   HGB 15.8 14.3  HCT 48.9 42.0  MCV 83.4  --   MCH 27.0  --   MCHC 32.3  --   RDW 15.0  --   PLT 254  --    Cardiac Enzymes High Sensitivity Troponin:  Recent Labs  Lab 07/22/20 0900 07/22/20 1427  TROPONINIHS 17 21*      BNPNo results for input(s): BNP, PROBNP in the last 168 hours.  DDimer No results for input(s): DDIMER in the last 168 hours. TSH:  Lab Results  Component Value Date   TSH 1.389 05/12/2011   Lipids: Lab Results  Component Value Date   CHOL 145 05/13/2011   HDL 77 05/13/2011   LDLCALC  05/13/2011    55        Total Cholesterol/HDL:CHD Risk Coronary Heart Disease Risk Table                     Men   Women  1/2 Average Risk   3.4   3.3  Average Risk       5.0   4.4  2 X Average  Risk   9.6   7.1  3 X Average Risk  23.4   11.0        Use the calculated Patient Ratio above and the CHD Risk Table to determine the patient's CHD Risk.        ATP III CLASSIFICATION (LDL):  <100     mg/dL   Optimal  130-865100-129  mg/dL   Near or Above                    Optimal  130-159  mg/dL   Borderline  [id number]  mg/dL   High  >295>190     mg/dL   Very High   TRIG 65 28/41/324405/31/2012   CHOLHDL 1.9 05/13/2011   HgbA1c: Lab Results  Component Value Date   HGBA1C 12.8 (H) 07/22/2020   Magnesium:  Magnesium  Date Value Ref Range Status  07/22/2020 1.7 1.7 - 2.4 mg/dL Final    Comment:    Performed at [facility], [address] [place], [place] [address]   Drugs of Abuse     Component Value Date/Time   LABOPIA NONE DETECTED 07/22/2020 1427   COCAINSCRNUR POSITIVE (A) 07/22/2020 1427   LABBENZ NONE DETECTED 07/22/2020 1427   AMPHETMU NONE DETECTED 07/22/2020 1427   THCU NONE DETECTED 07/22/2020 1427   LABBARB NONE DETECTED 07/22/2020 1427     Radiology/Studies:  DG Chest Port 1 View  Result Date: 07/22/2020 CLINICAL DATA:  Chest pain with cardiac palpitations EXAM: PORTABLE CHEST 1 VIEW COMPARISON:  May 29, 2019 FINDINGS: Lungs are clear. Heart is borderline enlarged with pulmonary vascularity normal. No adenopathy. No bone lesions. IMPRESSION: Stable cardiac prominence.  Lungs clear.  No evident adenopathy. Electronically Signed   By: Bretta BangWilliam  Woodruff III M.D.   On: 07/22/2020 09:34   ECHOCARDIOGRAM COMPLETE  Result Date: 07/22/2020    ECHOCARDIOGRAM REPORT   Patient Name:   Ryan [name] Date of Exam: 07/22/2020 Medical Rec #:  [id number]        Height:       69.0 in Accession #:    [id number]       Weight:       223.0 lb Date of Birth:  Apr 25, 1961       BSA:          2.164 m Patient Age:    58 years         BP:           151/99 mmHg Patient Gender: M                HR:  116 bpm. Exam Location:  Inpatient Procedure: 2D Echo Indications:    atrial flutter 427.32   History:        Patient has no prior history of Echocardiogram examinations.                 Chronic kidney disease, Arrythmias:Atrial Flutter; Risk                 Factors:Hypertension and Diabetes.  Sonographer:    Delcie Roch Referring Phys: [id number] RHONDA G BARRETT IMPRESSIONS  1. Left ventricular ejection fraction, by estimation, is 65 to 70%. The left ventricle has hyperdynamic function. The left ventricle has no regional wall motion abnormalities. There is mild concentric left ventricular hypertrophy. Left ventricular diastolic function could not be evaluated.  2. Right ventricular systolic function is normal. The right ventricular size is normal.  3. Left atrial size was mildly dilated.  4. The mitral valve is normal in structure. No evidence of mitral valve regurgitation.  5. The aortic valve is tricuspid. Aortic valve regurgitation is not visualized. Mild aortic valve sclerosis is present, with no evidence of aortic valve stenosis. FINDINGS  Left Ventricle: Left ventricular ejection fraction, by estimation, is 65 to 70%. The left ventricle has hyperdynamic function. The left ventricle has no regional wall motion abnormalities. The left ventricular internal cavity size was normal in size. There is mild concentric left ventricular hypertrophy. Left ventricular diastolic function could not be evaluated due to atrial fibrillation. Left ventricular diastolic function could not be evaluated. Right Ventricle: The right ventricular size is normal. No increase in right ventricular wall thickness. Right ventricular systolic function is normal. Left Atrium: Left atrial size was mildly dilated. Right Atrium: Right atrial size was normal in size. Pericardium: There is no evidence of pericardial effusion. Mitral Valve: The mitral valve is normal in structure. No evidence of mitral valve regurgitation. Tricuspid Valve: The tricuspid valve is normal in structure. Tricuspid valve regurgitation is not demonstrated.  Aortic Valve: The aortic valve is tricuspid. Aortic valve regurgitation is not visualized. Mild aortic valve sclerosis is present, with no evidence of aortic valve stenosis. Pulmonic Valve: The pulmonic valve was normal in structure. Pulmonic valve regurgitation is not visualized. Aorta: The aortic root is normal in size and structure. IAS/Shunts: No atrial level shunt detected by color flow Doppler.  LEFT VENTRICLE PLAX 2D LVIDd:         4.20 cm LVIDs:         2.00 cm LV PW:         1.30 cm LV IVS:        1.10 cm LVOT diam:     1.90 cm LVOT Area:     2.84 cm  IVC IVC diam: 1.50 cm LEFT ATRIUM             Index       RIGHT ATRIUM           Index LA diam:        4.20 cm 1.94 cm/m  RA Area:     11.80 cm LA Vol (A2C):   58.4 ml 26.99 ml/m RA Volume:   25.20 ml  11.65 ml/m LA Vol (A4C):   65.4 ml 30.22 ml/m LA Biplane Vol: 64.6 ml 29.85 ml/m   AORTA Ao Root diam: 3.00 cm Ao Asc diam:  3.20 cm  SHUNTS Systemic Diam: 1.90 cm Rachelle Hora Croitoru MD Electronically signed by Thurmon Fair MD Signature Date/Time: 07/22/2020/4:13:40 PM    Final  Assessment and Plan:   1. Atrial flutter, RVR - unknown duration, rapid rate - no BB w/ hx cocaine - has been started on IV Cardizem, continue this and increase as BP/HR will tolerate - start anticoag, discuss oral vs heparin w/ MD - CHA2DS2-VASc =1 (HTN) - Start Eliquis - ck TSH  2. HTN - BP improved w/ Cardizem, convert to po when in SR  3. Hx Cocaine use - ck UDS  Attending attestation:  Pt. Seen and examined. Agree with the Resident/NP/PA-C note as written. Ryan [name] is a 59 y.o. male who is being seen today for the evaluation of atrial flutter at the request of Dr. Jeraldine Loots. This is a 59 year old male with a history of hypertension, diabetes, chronic kidney disease and ongoing cocaine use, last on this past Monday.  He presents with several days of palpitations, chest discomfort and reflux symptoms.  In the ER he is found to be in atrial flutter  with 2-1 AV block.  He also had uncontrolled hypertension with diastolic blood pressures over 409.  Creatinine elevated 1.55.  Initial troponin is negative at 17.  He has a history of COVID-19 positive in 2020.  Started on IV diltiazem and already has had some improvement in rates now with variable conduction demonstrating typical atrial flutter.  On physical exam he is somewhat quiet but does answer questions, appears in no acute distress, lungs clear bilaterally heart irregular and tachycardic, no murmur, abdomen obese, soft, nontender, extremities without edema, neurologically grossly nonfocal, follows commands.  This is a 59 year old male with recent cocaine use who presented with atrial flutter with 2-1 AV block, now improved on diltiazem.  The duration of his flutter appears to be several days and therefore would recommend rate control and anticoagulation strategy rather than early cardioversion.  Ultimately he may need TEE guided cardioversion or perhaps may be a candidate for flutter ablation as the rhythm is suggestive of typical right-sided isthmus dependent flutter.   Thanks for the consultation.  Cardiology will follow with you.  Time Spent with Patient: I have spent a total of 45 minutes with patient reviewing hospital notes, telemetry, EKGs, labs and examining the patient as well as establishing an assessment and plan that was discussed with the patient.>50% of time was spent in direct patient care.  Chrystie Nose, MD, [facility], FACP  [place]  [facility]  Medical Director of the [facility]  [facility] Diplomate of the American Board of Clinical Lipidology Attending Cardiologist  Direct Dial: [phone]  Fax: [phone]  Website:  [email or web address] Chrystie Nose, MD  07/22/2020 4:15 PM

## 2020-07-22 NOTE — ED Provider Notes (Signed)
[facility] EMERGENCY DEPARTMENT Provider Note   CSN: [id number] Arrival date & time: 07/22/20  4010     History Chief Complaint  Patient presents with  . Hypertension  . Tachycardia  . Diaphoretic    Ryan [name] is a 59 y.o. male with history of insulin dependent DM, HTN, CKD who presents with palpitations and chest pain. He states he got up yesterday and wasn't feeling well. The chest pain is substernal and feels like acid reflux. He went to work however and then yesterday afternoon he went to the [facility] to get refills on his BP meds. He's been out of them for 2-3 weeks. They were sent in but not till late so he wasn't able to pick up the refills. This morning he continued to feel poorly so he decided to come to the ED. He reports palpitations, substernal chest pain, diaphoresis, lightheadedness, and SOB. He denies fever, chills, cough, abdominal pain, N/V. He reports using cocaine 2 days ago.   HPI     Past Medical History:  Diagnosis Date  . Achilles tendon rupture right  . Arthritis    back and shoulders  . Chest pain    due to cocaine use  . Diabetes mellitus   . Diabetes mellitus without complication (HCC)   . Drug abuse (HCC)    cocaine, marijuana abuse  . GERD (gastroesophageal reflux disease)   . Hypertension   . Renal insufficiency     Patient Active Problem List   Diagnosis Date Noted  . COVID-19 virus infection 05/31/2019  . Orthostatic syncope 05/31/2019  . Insulin-requiring or dependent type II diabetes mellitus (HCC) 05/31/2019  . Drug abuse (HCC)   . CKD (chronic kidney disease), stage III   . Right ankle pain 05/15/2014  . Need for Tdap vaccination 05/15/2014  . Essential hypertension 05/14/2014  . Diabetes mellitus (HCC) 05/14/2014  . Rupture of right Achilles tendon 05/02/2014  . Tendon rupture, Achilles 05/02/2014    Past Surgical History:  Procedure Laterality Date  . ACHILLES TENDON SURGERY Right 05/02/2014   Procedure:  RIGHT ACHILLES TENDON REPAIR;  Surgeon: Javier Docker, MD;  Location: [facility];  Service: Orthopedics;  Laterality: Right;  . CARDIAC CATHETERIZATION  05.31.2012   showing patent coronaries with no visualized vasospasm and EF of 55-60 %  . ORBITAL FRACTURE SURGERY Left 2-3 yrs ago       No family history on file.  Social History   Tobacco Use  . Smoking status: Never Smoker  . Smokeless tobacco: Never Used  Substance Use Topics  . Alcohol use: Yes    Comment: occassional  . Drug use: Yes    Types: Cocaine, Marijuana    Comment: last cocaine use 2 to 3 yrs ago, last marijuana use 4 to 5 years ago    Home Medications Prior to Admission medications   Medication Sig Start Date End Date Taking? Authorizing Provider  amLODipine (NORVASC) 10 MG tablet Take 1 tablet (10 mg total) by mouth every morning. Resume in 1 week 06/07/19   Elgergawy, Leana Roe, MD  carvedilol (COREG) 25 MG tablet Take 25 mg by mouth 2 (two) times daily. 03/03/20   [provider]  cyclobenzaprine (FLEXERIL) 10 MG tablet Take 1 tablet (10 mg total) by mouth at bedtime. 03/25/20   Placido Sou, PA-C  famotidine (PEPCID) 20 MG tablet Take 1 tablet (20 mg total) by mouth 2 (two) times daily for 5 days. Patient taking differently: Take 20 mg by mouth  daily.  05/01/19 03/25/20  Joy, Shawn C, PA-C  insulin NPH-regular Human (NOVOLIN 70/30) (70-30) 100 UNIT/ML injection Take a directed. Patient taking differently: Inject 20 Units into the skin 2 (two) times daily with a meal. Take a directed. 12/21/17   Mardella LaymanHagler, Brian, MD  lisinopril-hydrochlorothiazide (ZESTORETIC) 20-25 MG tablet Take 1 tablet by mouth every morning. Please resume in 1 week Patient taking differently: Take 1 tablet by mouth every morning.  06/07/19   Elgergawy, Leana Roeawood S, MD  methocarbamol (ROBAXIN) 500 MG tablet Take 2 tablets (1,000 mg total) by mouth 4 (four) times daily. 04/07/20   Renne CriglerGeiple, Joshua, PA-C  MOBIC 15 MG tablet Take 15 mg by mouth daily.  03/03/20   [provider]  omeprazole (PRILOSEC) 20 MG capsule Take 20 mg by mouth daily. 03/13/20   [provider]  pantoprazole (PROTONIX) 20 MG tablet Take 1 tablet (20 mg total) by mouth 2 (two) times daily. Patient taking differently: Take 20 mg by mouth daily.  02/19/19   Raeford RazorKohut, Stephen, MD  potassium chloride 20 MEQ TBCR Take 20 mEq by mouth daily. 05/28/19   Hedges, Tinnie GensJeffrey, PA-C  SEROQUEL 50 MG tablet Take 50 mg by mouth at bedtime. 03/03/20   [provider]  ZYRTEC ALLERGY 10 MG tablet Take 10 mg by mouth at bedtime. 03/03/20   [provider]    Allergies    Patient has no known allergies.  Review of Systems   Review of Systems  Constitutional: Positive for diaphoresis. Negative for chills and fever.  Respiratory: Positive for shortness of breath. Negative for cough.   Cardiovascular: Positive for chest pain and palpitations.  Gastrointestinal: Negative for abdominal pain, nausea and vomiting.  Neurological: Positive for light-headedness. Negative for syncope.  All other systems reviewed and are negative.   Physical Exam Updated Vital Signs BP (!) 155/110 (BP Location: Right Arm)   Pulse (!) 150   Temp 98.7 F (37.1 C) (Oral)   Resp 18   Ht 5\' 9"  (1.753 m)   Wt 101.2 kg   SpO2 96%   BMI 32.93 kg/m   Physical Exam Vitals and nursing note reviewed.  Constitutional:      General: He is not in acute distress.    Appearance: Normal appearance. He is well-developed. He is not ill-appearing.  HENT:     Head: Normocephalic and atraumatic.  Eyes:     General: No scleral icterus.       Right eye: No discharge.        Left eye: No discharge.     Conjunctiva/sclera: Conjunctivae normal.     Pupils: Pupils are equal, round, and reactive to light.  Cardiovascular:     Rate and Rhythm: Tachycardia present.     Pulses: Normal pulses.     Comments: HR 150-160s Pulmonary:     Effort: Pulmonary effort is normal. No respiratory distress.      Breath sounds: Normal breath sounds.  Abdominal:     General: There is no distension.     Palpations: Abdomen is soft.     Tenderness: There is no abdominal tenderness.     Hernia: A hernia (small, reducible umbilical hernia) is present.  Musculoskeletal:     Cervical back: Normal range of motion.  Skin:    General: Skin is warm and dry.  Neurological:     Mental Status: He is alert and oriented to person, place, and time.  Psychiatric:        Behavior: Behavior normal.  ED Results / Procedures / Treatments   Labs (all labs ordered are listed, but only abnormal results are displayed) Labs Reviewed  BASIC METABOLIC PANEL  CBC  RAPID URINE DRUG SCREEN, HOSP PERFORMED  TROPONIN I (HIGH SENSITIVITY)    EKG EKG Interpretation  Date/Time:  Tuesday July 22 2020 08:20:43 EDT Ventricular Rate:  161 PR Interval:    QRS Duration: 92 QT Interval:  248 QTC Calculation: 405 R Axis:   15 Text Interpretation: Atrial flutter with 2:1 A-V conduction Minimal voltage criteria for LVH, may be normal variant ( Cornell product ) ST-t wave abnormality Abnormal ECG Confirmed by Gerhard Munch [phone]) on 07/22/2020 8:52:35 AM   Radiology No results found.  Procedures Procedures (including critical care time)  CRITICAL CARE Performed by: Bethel Born   Total critical care time: 35 minutes  Critical care time was exclusive of separately billable procedures and treating other patients.  Critical care was necessary to treat or prevent imminent or life-threatening deterioration.  Critical care was time spent personally by me on the following activities: development of treatment plan with patient and/or surrogate as well as nursing, discussions with consultants, evaluation of patient's response to treatment, examination of patient, obtaining history from patient or surrogate, ordering and performing treatments and interventions, ordering and review of laboratory studies, ordering  and review of radiographic studies, pulse oximetry and re-evaluation of patient's condition.   Medications Ordered in ED Medications  labetalol (NORMODYNE) injection 5 mg (5 mg Intravenous Given 07/22/20 1027)  diltiazem (CARDIZEM) 1 mg/mL load via infusion 10 mg (10 mg Intravenous Bolus from Bag 07/22/20 1142)    And  diltiazem (CARDIZEM) 125 mg in dextrose 5% 125 mL (1 mg/mL) infusion (7.5 mg/hr Intravenous Rate/Dose Change 07/22/20 1418)  alum & mag hydroxide-simeth (MAALOX/MYLANTA) 200-200-20 MG/5ML suspension 30 mL (30 mLs Oral Not Given 07/22/20 1225)  apixaban (ELIQUIS) tablet 5 mg (has no administration in time range)  nitroGLYCERIN (NITROSTAT) SL tablet 0.4 mg (has no administration in time range)  acetaminophen (TYLENOL) tablet 650 mg (has no administration in time range)  ondansetron (ZOFRAN) injection 4 mg (has no administration in time range)  ALPRAZolam (XANAX) tablet 0.25 mg (has no administration in time range)  sodium chloride flush (NS) 0.9 % injection 3 mL (3 mLs Intravenous Not Given 07/22/20 1411)  sodium chloride flush (NS) 0.9 % injection 3 mL (has no administration in time range)  0.9 %  sodium chloride infusion (has no administration in time range)  zolpidem (AMBIEN) tablet 5 mg (has no administration in time range)  labetalol (NORMODYNE) injection 10 mg (10 mg Intravenous Given 07/22/20 0858)  LORazepam (ATIVAN) injection 1 mg (1 mg Intravenous Given 07/22/20 0930)  lactated ringers bolus 1,000 mL (0 mLs Intravenous Stopped 07/22/20 1111)  adenosine (ADENOCARD) 6 MG/2ML injection 6 mg (6 mg Intravenous Given 07/22/20 1059)  alum & mag hydroxide-simeth (MAALOX/MYLANTA) 200-200-20 MG/5ML suspension 30 mL (30 mLs Oral Given 07/22/20 1223)    And  lidocaine (XYLOCAINE) 2 % viscous mouth solution 15 mL (15 mLs Oral Given 07/22/20 1223)    ED Course  I have reviewed the triage vital signs and the nursing notes.  Pertinent labs & imaging results that were available during my  care of the patient were reviewed by me and considered in my medical decision making (see chart for details).  59 year old male presents with palpitations, chest pain, diaphoresis for the past couple days. He is noted to have elevated HR with rates consistently in the  150-160s and he is hypertensive. EKG shows likely A.flutter with 2:1 conduction. Other consideration is a SVT. Pt does endorse cocaine use - last use was 2 days ago which could be the cause of his symptoms. He's never had this before. Will initiate chest pain work up. Shared visit with Dr. Jeraldine Loots. Will give IV dose Labetolol, benzos, and LR bolus.  CBC is normal. BMP shows low bicarb (19), hyperglycemia (300), elevated anion gap (18). Will obtain VBG and UA   Pt has received a total of 20 Labetolol with no improvement in HR. Will try adenosine to sort if this is truly A.fib vs SVT  Adenosine given - underlying rhythm is A.flutter. Will discuss with Cards regarding next steps.  Cardiology will come to see - they recommend Diltiazem infusion.   Cards to admit  MDM Rules/Calculators/A&P                           Final Clinical Impression(s) / ED Diagnoses Final diagnoses:  Atrial flutter with rapid ventricular response (HCC)  Cocaine abuse [facility])    Rx / DC Orders ED Discharge Orders    None       Bethel Born, PA-C 07/22/20 1418    Gerhard Munch, MD 07/22/20 1531

## 2020-07-22 NOTE — Progress Notes (Signed)
ANTICOAGULATION CONSULT NOTE - Initial Consult  Pharmacy Consult for apixaban Indication: atrial fibrillation  No Known Allergies  Patient Measurements: Height: 5\' 9"  (175.3 cm) Weight: 101.2 kg (223 lb) IBW/kg (Calculated) : 70.7  Vital Signs: Temp: 98.7 F (37.1 C) (08/10 0816) Temp Source: Oral (08/10 0816) BP: 110/84 (08/10 1030) Pulse Rate: 153 (08/10 1030)  Labs: Recent Labs    07/22/20 0900 07/22/20 1101  HGB 15.8 14.3  HCT 48.9 42.0  PLT 254  --   CREATININE 1.55*  --   TROPONINIHS 17  --     Estimated Creatinine Clearance: 62.4 mL/min (A) (by C-G formula based on SCr of 1.55 mg/dL (H)).   Medical History: Past Medical History:  Diagnosis Date  . Achilles tendon rupture right  . Arthritis    back and shoulders  . Atrial flutter with rapid ventricular response (HCC) 07/22/2020  . Chest pain    due to cocaine use  . Diabetes mellitus without complication (HCC)   . Drug abuse (HCC)    cocaine, marijuana abuse  . GERD (gastroesophageal reflux disease)   . Hypertension   . Renal insufficiency    Assessment: 42 yom presented to the ED with new onset afib. To start apixaban for stroke prevention. Baseline CBC is WNL. Scr is elevated at 1.55. He is not on anticoagulation PTA.   Goal of Therapy:  Stroke prevention Monitor platelets by anticoagulation protocol: Yes   Plan:  Apixaban 5mg  PO BID F/u renal fxn, S&S of bleeding  Jaymere Alen, 61 07/22/2020,12:22 PM

## 2020-07-23 DIAGNOSIS — I10 Essential (primary) hypertension: Secondary | ICD-10-CM

## 2020-07-23 LAB — COMPREHENSIVE METABOLIC PANEL
ALT: 17 U/L (ref 0–44)
AST: 15 U/L (ref 15–41)
Albumin: 2.9 g/dL — ABNORMAL LOW (ref 3.5–5.0)
Alkaline Phosphatase: 86 U/L (ref 38–126)
Anion gap: 10 (ref 5–15)
BUN: 13 mg/dL (ref 6–20)
CO2: 24 mmol/L (ref 22–32)
Calcium: 8.6 mg/dL — ABNORMAL LOW (ref 8.9–10.3)
Chloride: 102 mmol/L (ref 98–111)
Creatinine, Ser: 1.35 mg/dL — ABNORMAL HIGH (ref 0.61–1.24)
GFR calc Af Amer: >60 mL/min (ref >60)
GFR calc non Af Amer: 58 mL/min — ABNORMAL LOW (ref >60)
Glucose, Bld: 292 mg/dL — ABNORMAL HIGH (ref 70–99)
Potassium: 3.5 mmol/L (ref 3.5–5.1)
Sodium: 136 mmol/L (ref 135–145)
Total Bilirubin: 0.5 mg/dL (ref 0.3–1.2)
Total Protein: 6.4 g/dL — ABNORMAL LOW (ref 6.5–8.1)

## 2020-07-23 LAB — GLUCOSE, CAPILLARY
Glucose-Capillary: 298 mg/dL — ABNORMAL HIGH (ref 70–99)
Glucose-Capillary: 403 mg/dL — ABNORMAL HIGH (ref 70–99)
Glucose-Capillary: 368 mg/dL — ABNORMAL HIGH (ref 70–99)
Glucose-Capillary: 183 mg/dL — ABNORMAL HIGH (ref 70–99)
Glucose-Capillary: 259 mg/dL — ABNORMAL HIGH (ref 70–99)

## 2020-07-23 MED ORDER — DILTIAZEM HCL ER COATED BEADS 240 MG PO CP24
240.0000 mg | ORAL_CAPSULE | Freq: Every day | ORAL | Status: DC
  Administered 2020-07-23 – 2020-07-26 (×4): 240 mg via ORAL
  Filled 2020-07-23 (×4): qty 1

## 2020-07-23 MED ORDER — INSULIN GLARGINE 100 UNIT/ML ~~LOC~~ SOLN
30.0000 [IU] | Freq: Every day | SUBCUTANEOUS | Status: DC
  Administered 2020-07-23: 30 [IU] via SUBCUTANEOUS
  Filled 2020-07-23 (×2): qty 0.3

## 2020-07-23 MED ORDER — INSULIN ASPART 100 UNIT/ML ~~LOC~~ SOLN
5.0000 [IU] | Freq: Three times a day (TID) | SUBCUTANEOUS | Status: DC
  Administered 2020-07-23 – 2020-07-26 (×6): 5 [IU] via SUBCUTANEOUS

## 2020-07-23 MED ORDER — LIVING WELL WITH DIABETES BOOK
Freq: Once | Status: AC
  Filled 2020-07-23: qty 1

## 2020-07-23 MED ORDER — INSULIN GLARGINE 100 UNIT/ML ~~LOC~~ SOLN
10.0000 [IU] | Freq: Every day | SUBCUTANEOUS | Status: DC
  Filled 2020-07-23: qty 0.1

## 2020-07-23 NOTE — Progress Notes (Signed)
Progress Note  Patient Name: Ryan [name] Date of Encounter: 07/23/2020  [facility] Cardiologist: Chrystie Nose, MD   Subjective   No acute overnight events. Converted to sinus rhythm. No palpitations, chest pain, or shortness of breath.  Inpatient Medications    Scheduled Meds: . apixaban  5 mg Oral BID  . insulin aspart  0-20 Units Subcutaneous TID WC  . insulin aspart  0-5 Units Subcutaneous QHS  . sodium chloride flush  3 mL Intravenous Q12H   Continuous Infusions: . sodium chloride    . diltiazem (CARDIZEM) infusion 10 mg/hr (07/22/20 2204)   PRN Meds: sodium chloride, acetaminophen, ALPRAZolam, alum & mag hydroxide-simeth, nitroGLYCERIN, ondansetron (ZOFRAN) IV, sodium chloride flush, zolpidem   Vital Signs    Vitals:   07/23/20 0055 07/23/20 0500 07/23/20 0509 07/23/20 0751  BP: 132/86  (!) 127/91 (!) 148/91  Pulse: 75  74 71  Resp: 18  18 16   Temp: 98.1 F (36.7 C)  98.1 F (36.7 C) 98.2 F (36.8 C)  TempSrc: Oral  Oral Oral  SpO2: 97%  94% 95%  Weight:  99.7 kg    Height:        Intake/Output Summary (Last 24 hours) at 07/23/2020 0801 Last data filed at 07/23/2020 0510 Gross per 24 hour  Intake 1560 ml  Output 2600 ml  Net -1040 ml   Last 3 Weights 07/23/2020 07/22/2020 07/22/2020  Weight (lbs) 219 lb 12.8 oz 222 lb 8 oz 223 lb  Weight (kg) 99.701 kg 100.925 kg 101.152 kg      Telemetry    Normal sinus rhythm with rates in the 70's to 80's. Short runs of non-sustained VT of 3 beats. - Personally Reviewed  ECG    Normal sinus rhythm, rate 67 bpm, with Q waves in V1-V3, and T wave inversions in lateral leads. - Personally Reviewed  Physical Exam   GEN: No acute distress.   Neck: Supple. Cardiac: RRR. No murmurs, rubs, or gallops. Radial and distal pedal pulses 2+ and equal bilaterally. Respiratory: Clear to auscultation bilaterally. GI: Soft, non-tender, non-distended  MS: No lower extremity edema. No deformity. Skin: Warm and  dry. Neuro:  No focal deficits. Psych: Normal affect. Responds appropriately.  Labs    High Sensitivity Troponin:   Recent Labs  Lab 07/22/20 0900 07/22/20 1427 07/22/20 1745  TROPONINIHS 17 21* 21*      Chemistry Recent Labs  Lab 07/22/20 0900 07/22/20 1101 07/23/20 0503  NA 138 140 136  K 3.8 3.5 3.5  CL 101  --  102  CO2 19*  --  24  GLUCOSE 300*  --  292*  BUN 11  --  13  CREATININE 1.55*  --  1.35*  CALCIUM 9.5  --  8.6*  PROT  --   --  6.4*  ALBUMIN  --   --  2.9*  AST  --   --  15  ALT  --   --  17  ALKPHOS  --   --  86  BILITOT  --   --  0.5  GFRNONAA 49*  --  58*  GFRAA 57*  --  >60  ANIONGAP 18*  --  10     Hematology Recent Labs  Lab 07/22/20 0900 07/22/20 1101  WBC 8.5  --   RBC 5.86*  --   HGB 15.8 14.3  HCT 48.9 42.0  MCV 83.4  --   MCH 27.0  --   MCHC 32.3  --  RDW 15.0  --   PLT 254  --     BNPNo results for input(s): BNP, PROBNP in the last 168 hours.   DDimer No results for input(s): DDIMER in the last 168 hours.   Radiology    DG Chest Port 1 View  Result Date: 07/22/2020 CLINICAL DATA:  Chest pain with cardiac palpitations EXAM: PORTABLE CHEST 1 VIEW COMPARISON:  May 29, 2019 FINDINGS: Lungs are clear. Heart is borderline enlarged with pulmonary vascularity normal. No adenopathy. No bone lesions. IMPRESSION: Stable cardiac prominence.  Lungs clear.  No evident adenopathy. Electronically Signed   By: Bretta Bang III M.D.   On: 07/22/2020 09:34   ECHOCARDIOGRAM COMPLETE  Result Date: 07/22/2020    ECHOCARDIOGRAM REPORT   Patient Name:   Ryan [name] Date of Exam: 07/22/2020 Medical Rec #:  [id number]        Height:       69.0 in Accession #:    [id number]       Weight:       223.0 lb Date of Birth:  11/11/1961       BSA:          2.164 m Patient Age:    59 years         BP:           151/99 mmHg Patient Gender: M                HR:           116 bpm. Exam Location:  Inpatient Procedure: 2D Echo Indications:    atrial  flutter 427.32  History:        Patient has no prior history of Echocardiogram examinations.                 Chronic kidney disease, Arrythmias:Atrial Flutter; Risk                 Factors:Hypertension and Diabetes.  Sonographer:    Delcie Roch Referring Phys: [id number] RHONDA G BARRETT IMPRESSIONS  1. Left ventricular ejection fraction, by estimation, is 65 to 70%. The left ventricle has hyperdynamic function. The left ventricle has no regional wall motion abnormalities. There is mild concentric left ventricular hypertrophy. Left ventricular diastolic function could not be evaluated.  2. Right ventricular systolic function is normal. The right ventricular size is normal.  3. Left atrial size was mildly dilated.  4. The mitral valve is normal in structure. No evidence of mitral valve regurgitation.  5. The aortic valve is tricuspid. Aortic valve regurgitation is not visualized. Mild aortic valve sclerosis is present, with no evidence of aortic valve stenosis. FINDINGS  Left Ventricle: Left ventricular ejection fraction, by estimation, is 65 to 70%. The left ventricle has hyperdynamic function. The left ventricle has no regional wall motion abnormalities. The left ventricular internal cavity size was normal in size. There is mild concentric left ventricular hypertrophy. Left ventricular diastolic function could not be evaluated due to atrial fibrillation. Left ventricular diastolic function could not be evaluated. Right Ventricle: The right ventricular size is normal. No increase in right ventricular wall thickness. Right ventricular systolic function is normal. Left Atrium: Left atrial size was mildly dilated. Right Atrium: Right atrial size was normal in size. Pericardium: There is no evidence of pericardial effusion. Mitral Valve: The mitral valve is normal in structure. No evidence of mitral valve regurgitation. Tricuspid Valve: The tricuspid valve is normal in structure. Tricuspid valve regurgitation is not  demonstrated. Aortic Valve:  The aortic valve is tricuspid. Aortic valve regurgitation is not visualized. Mild aortic valve sclerosis is present, with no evidence of aortic valve stenosis. Pulmonic Valve: The pulmonic valve was normal in structure. Pulmonic valve regurgitation is not visualized. Aorta: The aortic root is normal in size and structure. IAS/Shunts: No atrial level shunt detected by color flow Doppler.  LEFT VENTRICLE PLAX 2D LVIDd:         4.20 cm LVIDs:         2.00 cm LV PW:         1.30 cm LV IVS:        1.10 cm LVOT diam:     1.90 cm LVOT Area:     2.84 cm  IVC IVC diam: 1.50 cm LEFT ATRIUM             Index       RIGHT ATRIUM           Index LA diam:        4.20 cm 1.94 cm/m  RA Area:     11.80 cm LA Vol (A2C):   58.4 ml 26.99 ml/m RA Volume:   25.20 ml  11.65 ml/m LA Vol (A4C):   65.4 ml 30.22 ml/m LA Biplane Vol: 64.6 ml 29.85 ml/m   AORTA Ao Root diam: 3.00 cm Ao Asc diam:  3.20 cm  SHUNTS Systemic Diam: 1.90 cm Rachelle HoraMihai Croitoru MD Electronically signed by Thurmon FairMihai Croitoru MD Signature Date/Time: 07/22/2020/4:13:40 PM    Final     Cardiac Studies   Echocardiogram 07/22/2020: Impressions: 1. Left ventricular ejection fraction, by estimation, is 65 to 70%. The  left ventricle has hyperdynamic function. The left ventricle has no  regional wall motion abnormalities. There is mild concentric left  ventricular hypertrophy. Left ventricular  diastolic function could not be evaluated.  2. Right ventricular systolic function is normal. The right ventricular  size is normal.  3. Left atrial size was mildly dilated.  4. The mitral valve is normal in structure. No evidence of mitral valve  regurgitation.  5. The aortic valve is tricuspid. Aortic valve regurgitation is not  visualized. Mild aortic valve sclerosis is present, with no evidence of  aortic valve stenosis.   Patient Profile     59 y.o. male with a history of chest pain secondary to cocaine use, hypertension, diabetes  mellitus, GERD, CKD stage III, COVID infection in 05/2019, and drug use who was admitted on 05/22/2020 for atrial flutter with RVR.  Assessment & Plan    Atrial Flutter with RVR - Converted to sinus rhythm. Rates in the 70's to 80's. - Potassium 3.5 and Magnesium 1.7. - TSH normal. - Echo showed LVEF of 65-70% with normal wall motion and mild LVH. - UDS positive for cocaine. - Currently on IV Cardizem 10 cc/hr. Will stop drip and convert to PO Cardizem 240mg  daily. - CHA2DS2-VASc = 2 (HTN, DM). Started on Eliquis 5mg  twice daily.  Demand Ischemia - High-sensitivity troponin minimally elevated but flat at 17 >> 21 >> 21. Not consistent with ACS. - EKG this morning shows lateral T wave inversions.  - Echo with normal LVEF and normal wall motions. - Suspect demand ischemia in setting of atrial flutter with RVR, elevated BP, and cocaine use.  Hypertension  - BP still mildly elevated.  - Will convert to PO Cardizem 240mg  daily.  Uncontrolled Type 2 Diabetes Mellitus - Hemoglobin A1c 12.8.  - Anion gap on admission. Slightly elevated beta-hydroxybutyric acid. UA showed >/=  500 glucose, with 5 ketones, and 100 protein as well as small hemoglobin. - Continue sliding scale insulin here. - Patient states he was previously on Metformin and tolerated this well but this was stopped when he was "locked up."  - Discussed with Dr. Rennis Golden will add Lantus 10 units qHS and consult Diabetes Coordinator.  Cocaine Abuse - UDS positive for cocaine. - Discussed importance of complete cessation.  CKD Stage III - Creatinine 1.35 today. Was 1.55 on admission. - Continue to monitor.  For questions or updates, please contact [facility] Please consult [email or web address] for contact info under        Signed, Corrin Parker, PA-C  07/23/2020, 8:01 AM

## 2020-07-23 NOTE — Care Management (Signed)
1020 07-23-20 Patient presented for Atrial Flutter. Patient sees Ryan [name] at the [facility] for primary care. Patient has a follow up appointment within the month to follow up with provider. Patient gets his medications from the [facility]. Patient also uses [facility] for his ReilOn 70/30 insulin. Case Manager received consult for apixaban 5mg  po bid and xarelto 20mg /d. Patient is without insurance at this time. If the patient goes home with either medication listed above, the Cardiology office will need to initiate paperwork for patient assistance to see if the patient is eligible via the company. If patient transitions home with new medications from here- please send to transitions of care pharmacy (TOC). , RN,BSN Case Manager

## 2020-07-23 NOTE — Progress Notes (Signed)
Inpatient Diabetes Program Recommendations  AACE/ADA: New Consensus Statement on Inpatient Glycemic Control (2015)  Target Ranges:  Prepandial:   less than 140 mg/dL      Peak postprandial:   less than 180 mg/dL (1-2 hours)      Critically ill patients:  140 - 180 mg/dL   Lab Results  Component Value Date   GLUCAP 368 (H) 07/23/2020   HGBA1C 12.8 (H) 07/22/2020    Review of Glycemic Control Results for Ryan [name], Ryan [name] (MRN [id number]) as of 07/23/2020 14:09  Ref. Range 07/22/2020 22:07 07/23/2020 07:48 07/23/2020 11:36 07/23/2020 13:53  Glucose-Capillary Latest Ref Range: 70 - 99 mg/dL 850 (H) 277 (H) 412 (H) 368 (H)   Diabetes history: DM2 Outpatient Diabetes medications: Novolin Relion 70/30 20 units bid Current orders for Inpatient glycemic control: Lantus 10 units qd + Novolog resistant 0-20 correction tid + 0-5 units hs  Inpatient Diabetes Program Recommendations:   -Increase Lantus to 30 units qd and start now -Novolog 5 units tid meal coverage if eats 50% Secure chat to Dr. Zoila Shutter with recommendations  Discussed medication options with TOC Tomi Bamberger. Patient no longer able to obtain his insulin from the health department and only buys from [facility] [facility] when he has the finances, states he takes "sometimes". Tomi Bamberger gave instructions for patient to speak to NP Lavinia Sharps when he is unable to afford insulin for assistance. Patient does not currently have a glucose meter due to lost when moving. Will take a glucose meter with strips to patient.  Spoke with pt about new diagnosis. Discussed A1C results of 12.8 (average blood glucose of 320 over the past 2-3 months)  and explained what an A1C is, basic pathophysiology of DM Type 2, basic home care, basic diabetes diet nutrition principles, importance of checking CBGs and maintaining good CBG control to prevent long-term and short-term complications. Reviewed signs and symptoms of hyperglycemia and  hypoglycemia and how to treat hypoglycemia at home. Also reviewed blood sugar goals at home.  RNs to provide ongoing basic DM education at bedside with this patient. Have ordered educational booklet and DM videos.  Thank you, Billy Fischer. Reve Crocket, RN, MSN, CDE  Diabetes Coordinator Inpatient Glycemic Control Team Team Pager [phone] (8am-5pm) 07/23/2020 2:53 PM

## 2020-07-24 ENCOUNTER — Encounter (HOSPITAL_COMMUNITY): Payer: Self-pay | Admitting: Internal Medicine

## 2020-07-24 ENCOUNTER — Inpatient Hospital Stay (HOSPITAL_COMMUNITY): Payer: Self-pay

## 2020-07-24 DIAGNOSIS — R1013 Epigastric pain: Secondary | ICD-10-CM

## 2020-07-24 DIAGNOSIS — E119 Type 2 diabetes mellitus without complications: Secondary | ICD-10-CM

## 2020-07-24 DIAGNOSIS — Z794 Long term (current) use of insulin: Secondary | ICD-10-CM

## 2020-07-24 LAB — TROPONIN I (HIGH SENSITIVITY): Troponin I (High Sensitivity): 13 ng/L (ref <18)

## 2020-07-24 LAB — BASIC METABOLIC PANEL
Anion gap: 10 (ref 5–15)
BUN: 13 mg/dL (ref 6–20)
CO2: 25 mmol/L (ref 22–32)
Calcium: 8.3 mg/dL — ABNORMAL LOW (ref 8.9–10.3)
Chloride: 100 mmol/L (ref 98–111)
Creatinine, Ser: 1.39 mg/dL — ABNORMAL HIGH (ref 0.61–1.24)
GFR calc Af Amer: >60 mL/min (ref >60)
GFR calc non Af Amer: 56 mL/min — ABNORMAL LOW (ref >60)
Glucose, Bld: 247 mg/dL — ABNORMAL HIGH (ref 70–99)
Potassium: 3.5 mmol/L (ref 3.5–5.1)
Sodium: 135 mmol/L (ref 135–145)

## 2020-07-24 LAB — GLUCOSE, CAPILLARY
Glucose-Capillary: 351 mg/dL — ABNORMAL HIGH (ref 70–99)
Glucose-Capillary: 238 mg/dL — ABNORMAL HIGH (ref 70–99)
Glucose-Capillary: 209 mg/dL — ABNORMAL HIGH (ref 70–99)

## 2020-07-24 LAB — LIPASE, BLOOD: Lipase: 33 U/L (ref 11–51)

## 2020-07-24 MED ORDER — SUCRALFATE 1 GM/10ML PO SUSP
2.0000 g | Freq: Three times a day (TID) | ORAL | Status: DC
  Administered 2020-07-24 – 2020-07-26 (×5): 2 g via ORAL
  Filled 2020-07-24 (×5): qty 20

## 2020-07-24 MED ORDER — SUCRALFATE 1 G PO TABS
2.0000 g | ORAL_TABLET | Freq: Once | ORAL | Status: DC
  Filled 2020-07-24: qty 2

## 2020-07-24 MED ORDER — MORPHINE SULFATE (PF) 2 MG/ML IV SOLN
2.0000 mg | Freq: Once | INTRAVENOUS | Status: AC
  Administered 2020-07-24: 2 mg via INTRAVENOUS
  Filled 2020-07-24: qty 1

## 2020-07-24 MED ORDER — FAMOTIDINE 20 MG PO TABS
20.0000 mg | ORAL_TABLET | Freq: Every day | ORAL | Status: DC
  Administered 2020-07-24: 20 mg via ORAL
  Filled 2020-07-24: qty 1

## 2020-07-24 MED ORDER — IOHEXOL 350 MG/ML SOLN
100.0000 mL | Freq: Once | INTRAVENOUS | Status: AC | PRN
  Administered 2020-07-24: 100 mL via INTRAVENOUS

## 2020-07-24 MED ORDER — PANTOPRAZOLE SODIUM 40 MG PO TBEC
40.0000 mg | DELAYED_RELEASE_TABLET | Freq: Two times a day (BID) | ORAL | Status: DC
  Administered 2020-07-24 – 2020-07-26 (×5): 40 mg via ORAL
  Filled 2020-07-24 (×5): qty 1

## 2020-07-24 MED ORDER — INSULIN ASPART 100 UNIT/ML ~~LOC~~ SOLN
15.0000 [IU] | Freq: Once | SUBCUTANEOUS | Status: AC
  Administered 2020-07-24: 15 [IU] via SUBCUTANEOUS

## 2020-07-24 MED ORDER — MORPHINE SULFATE (PF) 2 MG/ML IV SOLN: 2.0000 mg | INTRAVENOUS | Status: DC

## 2020-07-24 MED ORDER — SUCRALFATE 1 GM/10ML PO SUSP
2.0000 g | ORAL | Status: AC
  Administered 2020-07-24: 2 g via ORAL
  Filled 2020-07-24: qty 20

## 2020-07-24 MED ORDER — FAMOTIDINE 20 MG PO TABS: 20.0000 mg | ORAL_TABLET | Freq: Two times a day (BID) | ORAL | Status: DC

## 2020-07-24 MED ORDER — INSULIN GLARGINE 100 UNIT/ML ~~LOC~~ SOLN
40.0000 [IU] | Freq: Every day | SUBCUTANEOUS | Status: DC
  Administered 2020-07-24 – 2020-07-25 (×2): 40 [IU] via SUBCUTANEOUS
  Filled 2020-07-24 (×3): qty 0.4

## 2020-07-24 NOTE — Progress Notes (Signed)
Results for RAHIM, ASTORGA (MRN [id number]) as of 07/24/2020 10:21  Ref. Range 07/23/2020 11:36 07/23/2020 13:53 07/23/2020 16:35 07/23/2020 20:36 07/24/2020 07:40  Glucose-Capillary Latest Ref Range: 70 - 99 mg/dL 951 (H) 884 (H) 166 (H) 259 (H) 351 (H)  Noted that blood sugars continue to be greater than 200 mg/dl.  Recommend increasing Lantus to 40 units daily (may need to change to Lantus 20 units BID) and continue current orders for Novolog RESISTANT correction scale and Novolog 5 units TID as meal coverage.  Smith Mince RN BSN CDE Diabetes Coordinator Pager: [phone]  8am-5pm

## 2020-07-24 NOTE — Progress Notes (Addendum)
Progress Note  Patient Name: Ryan [name] Date of Encounter: 07/24/2020  [facility] Cardiologist: Ryan Nose, MD   Subjective   Heartburn symptoms this morning. He has history of GERD and reports similar symptoms at home if he does not take Pepcid. Briefly improved with Mylanta and then returns. No exertional symptoms. No palpitations.  Inpatient Medications    Scheduled Meds: . apixaban  5 mg Oral BID  . diltiazem  240 mg Oral Daily  . insulin aspart  0-20 Units Subcutaneous TID WC  . insulin aspart  0-5 Units Subcutaneous QHS  . insulin aspart  15 Units Subcutaneous Once  . insulin aspart  5 Units Subcutaneous TID PC  . insulin glargine  30 Units Subcutaneous QHS  . sodium chloride flush  3 mL Intravenous Q12H   Continuous Infusions: . sodium chloride     PRN Meds: sodium chloride, acetaminophen, ALPRAZolam, alum & mag hydroxide-simeth, nitroGLYCERIN, ondansetron (ZOFRAN) IV, sodium chloride flush, zolpidem   Vital Signs    Vitals:   07/23/20 1933 07/24/20 0006 07/24/20 0310 07/24/20 0700  BP: 130/88 (!) 143/87 136/89 134/84  Pulse: 79 74 71 73  Resp: 18 18 18 18   Temp: 98.5 F (36.9 C) 98.1 F (36.7 C) 98 F (36.7 C) 98.1 F (36.7 C)  TempSrc: Oral Oral Oral Oral  SpO2: 98% 98% 100% 99%  Weight:      Height:        Intake/Output Summary (Last 24 hours) at 07/24/2020 09/23/2020 Last data filed at 07/24/2020 0700 Gross per 24 hour  Intake 1076 ml  Output 1575 ml  Net -499 ml   Last 3 Weights 07/23/2020 07/22/2020 07/22/2020  Weight (lbs) 219 lb 12.8 oz 222 lb 8 oz 223 lb  Weight (kg) 99.701 kg 100.925 kg 101.152 kg      Telemetry    Normal sinus rhythm with rates in the 60's to 70's. Very brief run of what looks like atrial tachycardia or fibrillation/flutter with rates in the 120's. - Personally Reviewed  ECG    Sinus bradycardia, rate 59 bpm, with T wave inversions in lateral leads and J point elevation in V2 which looks like early  repolarization (no significant changes compared to yesterday) - Personally Reviewed  Physical Exam   GEN: No acute distress.   Neck: No JVD. Cardiac: RRR. No murmurs, rubs, or gallops.  Respiratory: Clear to auscultation bilaterally. No wheezes, rhonchi, or rales. GI: Soft, non-tender, non-distended. Bowel sounds present. MS: No lower extremity edema. No deformity. Skin: Warm and dry. Neuro:  No focal deficits. Psych: Normal affect. Responds appropriately.  Labs    High Sensitivity Troponin:   Recent Labs  Lab 07/22/20 0900 07/22/20 1427 07/22/20 1745  TROPONINIHS 17 21* 21*      Chemistry Recent Labs  Lab 07/22/20 0900 07/22/20 0900 07/22/20 1101 07/23/20 0503 07/24/20 0347  NA 138   < > 140 136 135  K 3.8   < > 3.5 3.5 3.5  CL 101  --   --  102 100  CO2 19*  --   --  24 25  GLUCOSE 300*  --   --  292* 247*  BUN 11  --   --  13 13  CREATININE 1.55*  --   --  1.35* 1.39*  CALCIUM 9.5  --   --  8.6* 8.3*  PROT  --   --   --  6.4*  --   ALBUMIN  --   --   --  2.9*  --   AST  --   --   --  15  --   ALT  --   --   --  17  --   ALKPHOS  --   --   --  86  --   BILITOT  --   --   --  0.5  --   GFRNONAA 49*  --   --  58* 56*  GFRAA 57*  --   --  >60 >60  ANIONGAP 18*  --   --  10 10   < > = values in this interval not displayed.     Hematology Recent Labs  Lab 07/22/20 0900 07/22/20 1101  WBC 8.5  --   RBC 5.86*  --   HGB 15.8 14.3  HCT 48.9 42.0  MCV 83.4  --   MCH 27.0  --   MCHC 32.3  --   RDW 15.0  --   PLT 254  --     BNPNo results for input(s): BNP, PROBNP in the last 168 hours.   DDimer No results for input(s): DDIMER in the last 168 hours.   Radiology    DG Chest Port 1 View  Result Date: 07/22/2020 CLINICAL DATA:  Chest pain with cardiac palpitations EXAM: PORTABLE CHEST 1 VIEW COMPARISON:  May 29, 2019 FINDINGS: Lungs are clear. Heart is borderline enlarged with pulmonary vascularity normal. No adenopathy. No bone lesions. IMPRESSION:  Stable cardiac prominence.  Lungs clear.  No evident adenopathy. Electronically Signed   By: Ryan BangWilliam  Woodruff [name] M.D.   On: 07/22/2020 09:34   ECHOCARDIOGRAM COMPLETE  Result Date: 07/22/2020    ECHOCARDIOGRAM REPORT   Patient Name:   Ryan CobbsNTHONY [name] Date of Exam: 07/22/2020 Medical Rec #:  [id number]        Height:       69.0 in Accession #:    [id number]       Weight:       223.0 lb Date of Birth:  Nov 20, 1961       BSA:          2.164 m Patient Age:    58 years         BP:           151/99 mmHg Patient Gender: M                HR:           116 bpm. Exam Location:  Inpatient Procedure: 2D Echo Indications:    atrial flutter 427.32  History:        Patient has no prior history of Echocardiogram examinations.                 Chronic kidney disease, Arrythmias:Atrial Flutter; Risk                 Factors:Hypertension and Diabetes.  Sonographer:    Ryan [name] Referring Phys: [id number] Ryan [name] IMPRESSIONS  1. Left ventricular ejection fraction, by estimation, is 65 to 70%. The left ventricle has hyperdynamic function. The left ventricle has no regional wall motion abnormalities. There is mild concentric left ventricular hypertrophy. Left ventricular diastolic function could not be evaluated.  2. Right ventricular systolic function is normal. The right ventricular size is normal.  3. Left atrial size was mildly dilated.  4. The mitral valve is normal in structure. No evidence of mitral valve regurgitation.  5. The aortic valve is tricuspid. Aortic valve regurgitation is  not visualized. Mild aortic valve sclerosis is present, with no evidence of aortic valve stenosis. FINDINGS  Left Ventricle: Left ventricular ejection fraction, by estimation, is 65 to 70%. The left ventricle has hyperdynamic function. The left ventricle has no regional wall motion abnormalities. The left ventricular internal cavity size was normal in size. There is mild concentric left ventricular hypertrophy. Left ventricular diastolic  function could not be evaluated due to atrial fibrillation. Left ventricular diastolic function could not be evaluated. Right Ventricle: The right ventricular size is normal. No increase in right ventricular wall thickness. Right ventricular systolic function is normal. Left Atrium: Left atrial size was mildly dilated. Right Atrium: Right atrial size was normal in size. Pericardium: There is no evidence of pericardial effusion. Mitral Valve: The mitral valve is normal in structure. No evidence of mitral valve regurgitation. Tricuspid Valve: The tricuspid valve is normal in structure. Tricuspid valve regurgitation is not demonstrated. Aortic Valve: The aortic valve is tricuspid. Aortic valve regurgitation is not visualized. Mild aortic valve sclerosis is present, with no evidence of aortic valve stenosis. Pulmonic Valve: The pulmonic valve was normal in structure. Pulmonic valve regurgitation is not visualized. Aorta: The aortic root is normal in size and structure. IAS/Shunts: No atrial level shunt detected by color flow Doppler.  LEFT VENTRICLE PLAX 2D LVIDd:         4.20 cm LVIDs:         2.00 cm LV PW:         1.30 cm LV IVS:        1.10 cm LVOT diam:     1.90 cm LVOT Area:     2.84 cm  IVC IVC diam: 1.50 cm LEFT ATRIUM             Index       RIGHT ATRIUM           Index LA diam:        4.20 cm 1.94 cm/m  RA Area:     11.80 cm LA Vol (A2C):   58.4 ml 26.99 ml/m RA Volume:   25.20 ml  11.65 ml/m LA Vol (A4C):   65.4 ml 30.22 ml/m LA Biplane Vol: 64.6 ml 29.85 ml/m   AORTA Ao Root diam: 3.00 cm Ao Asc diam:  3.20 cm  SHUNTS Systemic Diam: 1.90 cm Rachelle Hora Croitoru MD Electronically signed by Thurmon Fair MD Signature Date/Time: 07/22/2020/4:13:40 PM    Final     Cardiac Studies   Echocardiogram 07/22/2020: Impressions: 1. Left ventricular ejection fraction, by estimation, is 65 to 70%. The  left ventricle has hyperdynamic function. The left ventricle has no  regional wall motion abnormalities. There  is mild concentric left  ventricular hypertrophy. Left ventricular  diastolic function could not be evaluated.  2. Right ventricular systolic function is normal. The right ventricular  size is normal.  3. Left atrial size was mildly dilated.  4. The mitral valve is normal in structure. No evidence of mitral valve  regurgitation.  5. The aortic valve is tricuspid. Aortic valve regurgitation is not  visualized. Mild aortic valve sclerosis is present, with no evidence of  aortic valve stenosis.   Patient Profile     59 y.o. male with a history of chest pain secondary to cocaine use, hypertension, diabetes mellitus, GERD, CKD stage [name], COVID infection in 05/2019, and drug use who was admitted on 05/22/2020 for atrial flutter with RVR.  Assessment & Plan    Atrial Flutter with RVR - Mainly  sinus rhythm with rates in the the 60's to 70's. Had a brief run of what looks like atrial tachycardia/fib but this only lasted a couple of seconds. - Potassium 3.5 and Magnesium 1.7. - TSH normal. - Echo showed LVEF of 65-70% with normal wall motion and mild LVH. - UDS positive for cocaine. - Continue Cardizem CD 240mg  daily. - CHA2DS2-VASc = 2 (HTN, DM). Started on Eliquis 5mg  twice daily.  Demand Ischemia - High-sensitivity troponin minimally elevated but flat at 17 >> 21 >> 21. Not consistent with ACS. - EKG this morning shows lateral T wave inversions.  - Echo with normal LVEF and normal wall motions. - Patient does report some heartburn this morning. Non-exertional. He has a history of GERD and takes Pepcid at home that helps control symptoms. Will restart home Pepcid. - Suspect demand ischemia in setting of atrial flutter with RVR, elevated BP, and cocaine use. - Could consider Myoview as outpatient.  GERD - Patient reports heart burn this burning. He has history of GERD and states symptoms feel similar. He usually takes Pepcid 20mg  daily at home for this which completely relieves  symptoms. He has been receiving Mylanta here which he states will help for a little but then symptoms return. - Will restart home Pepcid 20mg  daily.  Hypertension   - BP better controlled. - Continue PO Cardizem 240mg  daily.   Uncontrolled Type 2 Diabetes Mellitus - Hemoglobin A1c 12.8.  - Anion gap on admission. Slightly elevated beta-hydroxybutyric acid. UA showed >/= 500 glucose, with 5 ketones, and 100 protein as well as small hemoglobin. - Patient states he was previously on Metformin and tolerated this well but this was stopped when he was "locked up."  - Diabetes Coordinator consulted and recommendations followed. Added Lantus 30 units qHS and Novolog 5 units for meal coverage if patient eats >50% of meal in addition to sliding scale. - Of note, patient reportedly no longer able to obtain insulin from the health department and only buys from [place] when he has the finances. Case Manager aware. Patient also does not currently have a glucose meter. Diabetes Coordinator stated she would bring meter with strips to patient.   Cocaine Abuse - UDS positive for cocaine. - Discussed importance of complete cessation.  CKD Stage [name] - Creatinine stable at 1.39 today. Was 1.55 on admission. - Continue to monitor.  For questions or updates, please contact [facility] Please consult [email or web address] for contact info under        Signed, , PA-C  07/24/2020, 8:22 AM    Addendum:  back to check on patient and he reports worsening heartburn and is rolling around in bed. He states this is worse than usual. He did have an EKG this morning and it showed no significant changes but will repeat. Will given dose of sublingual Nitro. Asked RN to go ahead and give dose of Pepcid. Will also repeat Troponin to make sure we are not missing anything.  , PA-C 07/24/2020 9:19 AM

## 2020-07-24 NOTE — Progress Notes (Addendum)
Given mylanta for heartburn. Patient stated the heartburn just comes intermittently not related to eating.Stated mylanta helps for a short time and then the pain returns quickly. Usually takes pepcid ac at home once a day and it controls heartburn.  Notified Marjie Skiff PA about the heartburn (information above). She will see patient. Notified that patients blood sugar was 351 after he ate will give 15 units this morning and hold s/s and meal coverage and recheck cbg in 2 hours.  Reviewed s/s low and high blood sugars with patient. Patient denies any questions regarding checking blood sugars or administering insulin.

## 2020-07-24 NOTE — Consult Note (Signed)
Referring Provider: Marjie Skiff, PA Primary Care Physician:  Lavinia Sharps, NP Primary Gastroenterologist:  Dr. Dulce Sellar  Reason for Consultation:  Esophagitis  HPI: Ryan [name] is a 59 y.o. male with history of GERD, esophagitis, cocaine use, DM type 2, and recent A flutter presenting with a chief complaint of GERD.  Patient presented to the ED yesterday with chest pain and was found to be in A flutter with RVR.  He has since converted to NSR.  He reports severe GERD since yesterday.  States he takes omeprazole 40 mg daily as an outpatient.  Was started on Carafate and notes relief.    Denies any abdominal pain dysphagia, nausea, vomiting, hematemesis, changes in appetite, or unexplained weight loss.  Further denies any changes in stool, diarrhea, melena, or hematochezia.  Denies family history of colon cancer or gastrointestinal malignancy.  EGD 05/13/20: 5cm hiatal hernia, LA Grade C esophagitis  Colonoscopy 05/13/20: Moderate internal hemorrhoids, inadequate prep for screening - repeat recommended in 3 months  Past Medical History:  Diagnosis Date   Achilles tendon rupture right   Arthritis    back and shoulders   Atrial flutter with rapid ventricular response (HCC) 07/22/2020   Chest pain    due to cocaine use   Diabetes mellitus without complication (HCC)    Drug abuse (HCC)    cocaine, marijuana abuse   GERD (gastroesophageal reflux disease)    Hypertension    Renal insufficiency     Past Surgical History:  Procedure Laterality Date   ACHILLES TENDON SURGERY Right 05/02/2014   Procedure: RIGHT ACHILLES TENDON REPAIR;  Surgeon: Javier Docker, MD;  Location: [facility];  Service: Orthopedics;  Laterality: Right;   CARDIAC CATHETERIZATION  05.31.2012   showing patent coronaries with no visualized vasospasm and EF of 55-60 %   ORBITAL FRACTURE SURGERY Left 2-3 yrs ago    Prior to Admission medications   Medication Sig Start Date End Date Taking?  Authorizing Provider  amLODipine (NORVASC) 10 MG tablet Take 1 tablet (10 mg total) by mouth every morning. Resume in 1 week 06/07/19  Yes Elgergawy, Leana Roe, MD  carvedilol (COREG) 25 MG tablet Take 25 mg by mouth 2 (two) times daily. 03/03/20  Yes [provider]  insulin NPH-regular Human (NOVOLIN 70/30) (70-30) 100 UNIT/ML injection Take a directed. Patient taking differently: Inject 20 Units into the skin 2 (two) times daily with a meal. Take a directed. 12/21/17  Yes Hagler, Arlys John, MD  lisinopril-hydrochlorothiazide (ZESTORETIC) 20-25 MG tablet Take 1 tablet by mouth every morning. Please resume in 1 week Patient taking differently: Take 1 tablet by mouth every morning.  06/07/19  Yes Elgergawy, Leana Roe, MD  pantoprazole (PROTONIX) 20 MG tablet Take 1 tablet (20 mg total) by mouth 2 (two) times daily. Patient taking differently: Take 20 mg by mouth daily.  02/19/19  Yes Raeford Razor, MD  potassium chloride 20 MEQ TBCR Take 20 mEq by mouth daily. 05/28/19  Yes Hedges, Tinnie Gens, PA-C  SEROQUEL 50 MG tablet Take 50 mg by mouth at bedtime. 03/03/20  Yes [provider]  ZYRTEC ALLERGY 10 MG tablet Take 10 mg by mouth at bedtime. 03/03/20  Yes [provider]  cyclobenzaprine (FLEXERIL) 10 MG tablet Take 1 tablet (10 mg total) by mouth at bedtime. 03/25/20   Placido Sou, PA-C  famotidine (PEPCID) 20 MG tablet Take 1 tablet (20 mg total) by mouth 2 (two) times daily for 5 days. Patient taking differently: Take 20 mg by mouth  daily.  05/01/19 03/25/20  Joy, Shawn C, PA-C  methocarbamol (ROBAXIN) 500 MG tablet Take 2 tablets (1,000 mg total) by mouth 4 (four) times daily. Patient not taking: Reported on 07/22/2020 04/07/20   Renne Crigler, PA-C  MOBIC 15 MG tablet Take 15 mg by mouth daily. 03/03/20   [provider]    Scheduled Meds:  apixaban  5 mg Oral BID   diltiazem  240 mg Oral Daily   famotidine  20 mg Oral BID   insulin aspart  0-20 Units Subcutaneous  TID WC   insulin aspart  0-5 Units Subcutaneous QHS   insulin aspart  5 Units Subcutaneous TID PC   insulin glargine  40 Units Subcutaneous QHS    morphine injection  2 mg Intravenous Once    morphine injection  2 mg Intravenous Once   sodium chloride flush  3 mL Intravenous Q12H   sucralfate  2 g Oral STAT   sucralfate  2 g Oral TID AC   Continuous Infusions:  sodium chloride     PRN Meds:.sodium chloride, acetaminophen, ALPRAZolam, alum & mag hydroxide-simeth, nitroGLYCERIN, ondansetron (ZOFRAN) IV, sodium chloride flush, zolpidem  Allergies as of 07/22/2020   (No Known Allergies)    Family History  Problem Relation Age of Onset   Unexplained death Mother 63       She died in 2016-04-13, he does not remember what she died of   Unexplained death Father        He has no information on his father    Social History   Socioeconomic History   Marital status: Single    Spouse name: Not on file   Number of children: Not on file   Years of education: Not on file   Highest education level: Not on file  Occupational History   Not on file  Tobacco Use   Smoking status: Never Smoker   Smokeless tobacco: Never Used  Substance and Sexual Activity   Alcohol use: Yes    Alcohol/week: 14.0 standard drinks    Types: 14 Cans of beer per week    Comment: occassional   Drug use: Yes    Types: Cocaine, Marijuana    Comment: last cocaine use 2 to 3 yrs ago, last marijuana use 4 to 5 years ago   Sexual activity: Not on file  Other Topics Concern   Not on file  Social History Narrative   ** Merged History Encounter **       Pt currently lives with his brother, and his sister in [place]. He is employed by [employer]. He denies tobacco use. He does endorse occasional alcohol use. He does endorse cocaine use.  Family history : Non contributory   Social Determinants of Corporate investment banker Strain:    Difficulty of Paying Living Expenses:   Food  Insecurity:    Worried About Programme researcher, broadcasting/film/video in the Last Year:    Barista in the Last Year:   Transportation Needs:    Freight forwarder (Medical):    Lack of Transportation (Non-Medical):   Physical Activity:    Days of Exercise per Week:    Minutes of Exercise per Session:   Stress:    Feeling of Stress :   Social Connections:    Frequency of Communication with Friends and Family:    Frequency of Social Gatherings with Friends and Family:    Attends Religious Services:    Active Member of Clubs or  Organizations:    Attends Engineer, structuralClub or Organization Meetings:    Marital Status:   Intimate Partner Violence:    Fear of Current or Ex-Partner:    Emotionally Abused:    Physically Abused:    Sexually Abused:     Review of Systems: Review of Systems  Constitutional: Negative for chills, fever and weight loss.  HENT: Negative for hearing loss and tinnitus.   Eyes: Negative for pain and redness.  Respiratory: Negative for cough and shortness of breath.   Cardiovascular: Positive for chest pain. Negative for palpitations.  Gastrointestinal: Positive for heartburn. Negative for abdominal pain, blood in stool, constipation, diarrhea, melena, nausea and vomiting.  Genitourinary: Negative for flank pain and hematuria.  Musculoskeletal: Negative for falls and joint pain.  Skin: Negative for itching and rash.  Neurological: Negative for seizures and loss of consciousness.  Endo/Heme/Allergies: Negative for polydipsia. Does not bruise/bleed easily.  Psychiatric/Behavioral: Positive for substance abuse. The patient is not nervous/anxious.      Physical Exam: Vital signs: Vitals:   07/24/20 0700 07/24/20 1100  BP: 134/84 (!) 154/93  Pulse: 73 96  Resp: 18 16  Temp: 98.1 F (36.7 C) 98 F (36.7 C)  SpO2: 99% 99%   Last BM Date: 07/22/20 Physical Exam Constitutional:      General: He is not in acute distress.    Appearance: Normal appearance. He is  obese.  HENT:     Head: Normocephalic and atraumatic.     Nose: Nose normal.     Mouth/Throat:     Mouth: Mucous membranes are moist.     Pharynx: Oropharynx is clear.  Eyes:     General: No scleral icterus.    Extraocular Movements: Extraocular movements intact.     Conjunctiva/sclera: Conjunctivae normal.  Cardiovascular:     Rate and Rhythm: Normal rate and regular rhythm.     Pulses: Normal pulses.     Heart sounds: Normal heart sounds.  Pulmonary:     Effort: Pulmonary effort is normal. No respiratory distress.     Breath sounds: Normal breath sounds.  Abdominal:     General: Bowel sounds are normal. There is no distension.     Palpations: Abdomen is soft. There is no mass.     Tenderness: There is no abdominal tenderness. There is no guarding or rebound.     Hernia: No hernia is present.  Musculoskeletal:        General: No swelling or tenderness.     Cervical back: Normal range of motion and neck supple.  Skin:    General: Skin is warm and dry.  Neurological:     General: No focal deficit present.     Mental Status: He is alert and oriented to person, place, and time.  Psychiatric:        Mood and Affect: Mood normal.        Behavior: Behavior normal.      GI:  Lab Results: Recent Labs    07/22/20 0900 07/22/20 1101  WBC 8.5  --   HGB 15.8 14.3  HCT 48.9 42.0  PLT 254  --    BMET Recent Labs    07/22/20 0900 07/22/20 0900 07/22/20 1101 07/23/20 0503 07/24/20 0347  NA 138   < > 140 136 135  K 3.8   < > 3.5 3.5 3.5  CL 101  --   --  102 100  CO2 19*  --   --  24 25  GLUCOSE 300*  --   --  292* 247*  BUN 11  --   --  13 13  CREATININE 1.55*  --   --  1.35* 1.39*  CALCIUM 9.5  --   --  8.6* 8.3*   < > = values in this interval not displayed.   LFT Recent Labs    07/23/20 0503  PROT 6.4*  ALBUMIN 2.9*  AST 15  ALT 17  ALKPHOS 86  BILITOT 0.5   PT/INR No results for input(s): LABPROT, INR in the last 72  hours.   Studies/Results: ECHOCARDIOGRAM COMPLETE  Result Date: 07/22/2020    ECHOCARDIOGRAM REPORT   Patient Name:   BENNIE CHIRICO Date of Exam: 07/22/2020 Medical Rec #:  [id number]        Height:       69.0 in Accession #:    [id number]       Weight:       223.0 lb Date of Birth:  20-Apr-1961       BSA:          2.164 m Patient Age:    58 years         BP:           151/99 mmHg Patient Gender: M                HR:           116 bpm. Exam Location:  Inpatient Procedure: 2D Echo Indications:    atrial flutter 427.32  History:        Patient has no prior history of Echocardiogram examinations.                 Chronic kidney disease, Arrythmias:Atrial Flutter; Risk                 Factors:Hypertension and Diabetes.  Sonographer:    Delcie Roch Referring Phys: [id number] RHONDA G BARRETT IMPRESSIONS  1. Left ventricular ejection fraction, by estimation, is 65 to 70%. The left ventricle has hyperdynamic function. The left ventricle has no regional wall motion abnormalities. There is mild concentric left ventricular hypertrophy. Left ventricular diastolic function could not be evaluated.  2. Right ventricular systolic function is normal. The right ventricular size is normal.  3. Left atrial size was mildly dilated.  4. The mitral valve is normal in structure. No evidence of mitral valve regurgitation.  5. The aortic valve is tricuspid. Aortic valve regurgitation is not visualized. Mild aortic valve sclerosis is present, with no evidence of aortic valve stenosis. FINDINGS  Left Ventricle: Left ventricular ejection fraction, by estimation, is 65 to 70%. The left ventricle has hyperdynamic function. The left ventricle has no regional wall motion abnormalities. The left ventricular internal cavity size was normal in size. There is mild concentric left ventricular hypertrophy. Left ventricular diastolic function could not be evaluated due to atrial fibrillation. Left ventricular diastolic function could not be  evaluated. Right Ventricle: The right ventricular size is normal. No increase in right ventricular wall thickness. Right ventricular systolic function is normal. Left Atrium: Left atrial size was mildly dilated. Right Atrium: Right atrial size was normal in size. Pericardium: There is no evidence of pericardial effusion. Mitral Valve: The mitral valve is normal in structure. No evidence of mitral valve regurgitation. Tricuspid Valve: The tricuspid valve is normal in structure. Tricuspid valve regurgitation is not demonstrated. Aortic Valve: The aortic valve is tricuspid. Aortic valve regurgitation is not visualized. Mild aortic valve sclerosis is present, with no evidence of aortic valve stenosis. Pulmonic  Valve: The pulmonic valve was normal in structure. Pulmonic valve regurgitation is not visualized. Aorta: The aortic root is normal in size and structure. IAS/Shunts: No atrial level shunt detected by color flow Doppler.  LEFT VENTRICLE PLAX 2D LVIDd:         4.20 cm LVIDs:         2.00 cm LV PW:         1.30 cm LV IVS:        1.10 cm LVOT diam:     1.90 cm LVOT Area:     2.84 cm  IVC IVC diam: 1.50 cm LEFT ATRIUM             Index       RIGHT ATRIUM           Index LA diam:        4.20 cm 1.94 cm/m  RA Area:     11.80 cm LA Vol (A2C):   58.4 ml 26.99 ml/m RA Volume:   25.20 ml  11.65 ml/m LA Vol (A4C):   65.4 ml 30.22 ml/m LA Biplane Vol: 64.6 ml 29.85 ml/m   AORTA Ao Root diam: 3.00 cm Ao Asc diam:  3.20 cm  SHUNTS Systemic Diam: 1.90 cm Rachelle Hora Croitoru MD Electronically signed by Thurmon Fair MD Signature Date/Time: 07/22/2020/4:13:40 PM    Final    CT Angio Chest/Abd/Pel for Dissection W and/or W/WO  Result Date: 07/24/2020 CLINICAL DATA:  Chest and back pain. EXAM: CT ANGIOGRAPHY CHEST, ABDOMEN AND PELVIS TECHNIQUE: Non-contrast CT of the chest was initially obtained. Multidetector CT imaging through the chest, abdomen and pelvis was performed using the standard protocol during bolus administration  of intravenous contrast. Multiplanar reconstructed images and MIPs were obtained and reviewed to evaluate the vascular anatomy. CONTRAST:  OMNIPAQUE IOHEXOL 350 MG/ML SOLN COMPARISON:  CT chest 05/31/2019 and CT abdomen 07/13/2019 FINDINGS: CTA CHEST FINDINGS Cardiovascular: The heart is normal in size. No pericardial effusion. The aorta is normal in caliber. No dissection. Minimal atherosclerotic calcification at the aortic arch. A few scattered coronary artery calcifications are noted. The pulmonary arteries appear normal. No filling defects to suggest pulmonary embolism. Mediastinum/Nodes: Dilated fluid-filled esophagus. No obvious mass or inflammatory changes. Moderate-sized hiatal hernia with probable reflux. The stomach is distended with fluid. Small scattered mediastinal and hilar lymph nodes but no mass or adenopathy. Lungs/Pleura: Streaky bibasilar atelectasis. No infiltrates, edema or effusions. No worrisome pulmonary lesions. Musculoskeletal: No significant bony findings. Review of the MIP images confirms the above findings. CTA ABDOMEN AND PELVIS FINDINGS VASCULAR Aorta: Normal caliber. No atherosclerotic calcifications or dissection. Celiac: Normal SMA: Normal Renals: Minimal calcification involving the proximal left renal artery. IMA: Normal Inflow: Normal Veins: No gross abnormality. Review of the MIP images confirms the above findings. NON-VASCULAR Hepatobiliary: No hepatic lesions or intrahepatic biliary dilatation. The gallbladder is mildly contracted. No common bile duct dilatation. Pancreas: No mass, inflammation or ductal dilatation. Spleen: Normal size. No focal lesions. Adrenals/Urinary Tract: The adrenal glands and kidneys are unremarkable. The bladder is normal. Stomach/Bowel: The stomach is moderately distended with fluid. The duodenum, small bowel and colon are grossly normal. No acute inflammatory changes, mass lesions or obstructive findings. The terminal ileum is normal. The  appendix is normal. Moderate colonic diverticulosis but no findings for acute diverticulitis. Lymphatic: No mesenteric or retroperitoneal mass or adenopathy. Reproductive: The prostate gland and seminal vesicles are unremarkable. Other: Small periumbilical abdominal wall hernia containing fat. Musculoskeletal: No significant bony findings. Review of  the MIP images confirms the above findings. IMPRESSION: 1. Normal thoracic and abdominal aorta. No aneurysm or dissection. 2. No CT findings for pulmonary embolism. 3. Moderate-sized hiatal hernia with probable reflux. Moderately dilated fluid-filled esophagus. This may account for the patient's chest symptoms. 4. No acute abdominal/pelvic findings, mass lesions or adenopathy. 5. Diffuse colonic diverticulosis but no findings for acute diverticulitis. 6. Electronically Signed   By: Rudie Meyer M.D.   On: 07/24/2020 10:58    Impression: GERD, chest pain: Suspect ongoing esophagitis -Hemoglobin normal (15.8) as of 07/22/2020  A flutter with RVR, now in NSR  Cocaine use  Plan: EGD tomorrow with Dr. Levora Angel. I thoroughly discussed the procedure, nature, alternatives (observation/medical management), benefits, and risks (including but not limited to bleeding, infection, perforation, anesthesia/cardiac and pulmonary complications). Patient verbalized understanding and gave verbal consent to proceed with EGD tomorrow.    NPO after midnight.  OK to hold Eliquis per Dr. Rennis Golden (cardiology).  Continue Carafate.  Discontinue Pepcid and initiate Protonix 40 mg BID.  [facility] will follow.   LOS: 1 day   Edrick Kins  PA-C 07/24/2020, 11:56 AM  Contact #  [phone]

## 2020-07-25 ENCOUNTER — Inpatient Hospital Stay (HOSPITAL_COMMUNITY)
Admission: RE | Admit: 2020-07-25 | Payer: Self-pay | Source: Intra-hospital | Admitting: Physical Medicine & Rehabilitation

## 2020-07-25 ENCOUNTER — Inpatient Hospital Stay (HOSPITAL_COMMUNITY): Payer: Self-pay | Admitting: Certified Registered"

## 2020-07-25 ENCOUNTER — Encounter (HOSPITAL_COMMUNITY): Payer: Self-pay | Admitting: Internal Medicine

## 2020-07-25 ENCOUNTER — Other Ambulatory Visit: Payer: Self-pay | Admitting: Physician Assistant

## 2020-07-25 ENCOUNTER — Encounter (HOSPITAL_COMMUNITY)
Admission: EM | Disposition: A | Discharge status: Home / Self Care | Payer: Self-pay | Source: Home / Self Care | Attending: Internal Medicine

## 2020-07-25 DIAGNOSIS — K209 Esophagitis, unspecified without bleeding: Secondary | ICD-10-CM

## 2020-07-25 HISTORY — PX: ESOPHAGOGASTRODUODENOSCOPY: SHX5428

## 2020-07-25 HISTORY — PX: BIOPSY: SHX5522

## 2020-07-25 LAB — BASIC METABOLIC PANEL
Anion gap: 10 (ref 5–15)
BUN: 13 mg/dL (ref 6–20)
CO2: 23 mmol/L (ref 22–32)
Calcium: 8.6 mg/dL — ABNORMAL LOW (ref 8.9–10.3)
Chloride: 103 mmol/L (ref 98–111)
Creatinine, Ser: 1.4 mg/dL — ABNORMAL HIGH (ref 0.61–1.24)
GFR calc Af Amer: >60 mL/min (ref >60)
GFR calc non Af Amer: 56 mL/min — ABNORMAL LOW (ref >60)
Glucose, Bld: 182 mg/dL — ABNORMAL HIGH (ref 70–99)
Potassium: 3.6 mmol/L (ref 3.5–5.1)
Sodium: 136 mmol/L (ref 135–145)

## 2020-07-25 LAB — GLUCOSE, CAPILLARY
Glucose-Capillary: 269 mg/dL — ABNORMAL HIGH (ref 70–99)
Glucose-Capillary: 182 mg/dL — ABNORMAL HIGH (ref 70–99)
Glucose-Capillary: 184 mg/dL — ABNORMAL HIGH (ref 70–99)
Glucose-Capillary: 253 mg/dL — ABNORMAL HIGH (ref 70–99)
Glucose-Capillary: 267 mg/dL — ABNORMAL HIGH (ref 70–99)

## 2020-07-25 SURGERY — EGD (ESOPHAGOGASTRODUODENOSCOPY)
Anesthesia: Monitor Anesthesia Care

## 2020-07-25 MED ORDER — DILTIAZEM HCL ER COATED BEADS 240 MG PO CP24: 240.0000 mg | ORAL_CAPSULE | Freq: Every day | ORAL | 2 refills | Status: DC

## 2020-07-25 MED ORDER — SODIUM CHLORIDE 0.9 % IV SOLN: INTRAVENOUS | Status: DC

## 2020-07-25 MED ORDER — SUCRALFATE 1 G PO TABS
1.0000 g | ORAL_TABLET | Freq: Two times a day (BID) | ORAL | 2 refills | Status: DC
Start: 2020-07-25 — End: 2023-04-11

## 2020-07-25 MED ORDER — LIDOCAINE 2% (20 MG/ML) 5 ML SYRINGE
INTRAMUSCULAR | Status: DC | PRN
  Administered 2020-07-25: 40 mg via INTRAVENOUS
  Administered 2020-07-25: 60 mg via INTRAVENOUS

## 2020-07-25 MED ORDER — PROPOFOL 10 MG/ML IV BOLUS
INTRAVENOUS | Status: DC | PRN
  Administered 2020-07-25: 30 mg via INTRAVENOUS
  Administered 2020-07-25: 80 mg via INTRAVENOUS
  Administered 2020-07-25: 40 mg via INTRAVENOUS

## 2020-07-25 MED ORDER — LOSARTAN POTASSIUM 25 MG PO TABS
25.0000 mg | ORAL_TABLET | Freq: Every day | ORAL | Status: DC
  Administered 2020-07-25 – 2020-07-26 (×2): 25 mg via ORAL
  Filled 2020-07-25 (×2): qty 1

## 2020-07-25 MED ORDER — NOVOLIN 70/30 (70-30) 100 UNIT/ML ~~LOC~~ SUSP: SUBCUTANEOUS | 1 refills | Status: AC

## 2020-07-25 MED ORDER — LOSARTAN POTASSIUM 25 MG PO TABS: 25.0000 mg | ORAL_TABLET | Freq: Every day | ORAL | 2 refills | Status: DC

## 2020-07-25 MED ORDER — APIXABAN 5 MG PO TABS: 5.0000 mg | ORAL_TABLET | Freq: Two times a day (BID) | ORAL | 2 refills | Status: DC

## 2020-07-25 MED ORDER — PANTOPRAZOLE SODIUM 40 MG PO TBEC: 40.0000 mg | DELAYED_RELEASE_TABLET | Freq: Two times a day (BID) | ORAL | 2 refills | Status: DC

## 2020-07-25 MED ORDER — LACTATED RINGERS IV SOLN: INTRAVENOUS | Status: DC | PRN

## 2020-07-25 MED ORDER — LACTATED RINGERS IV SOLN: INTRAVENOUS | Status: DC

## 2020-07-25 MED ORDER — APIXABAN 5 MG PO TABS
5.0000 mg | ORAL_TABLET | Freq: Two times a day (BID) | ORAL | Status: DC
  Administered 2020-07-25 – 2020-07-26 (×2): 5 mg via ORAL
  Filled 2020-07-25 (×2): qty 1

## 2020-07-25 MED FILL — CARTIA XT 240 MG CAPSULE: 240 | 30 days supply | Qty: 30 | Fill #0

## 2020-07-25 MED FILL — PANTOPRAZOLE SOD DR 40 MG T: 40 | 30 days supply | Qty: 60 | Fill #0

## 2020-07-25 MED FILL — ELIQUIS 5 MG TABLET: 5 | 30 days supply | Qty: 60 | Fill #0

## 2020-07-25 MED FILL — ULTICARE INS 0.5 ML 30GX1/2: 30G X 1/2" | 30 days supply | Qty: 100 | Fill #0

## 2020-07-25 MED FILL — LOSARTAN POTASSIUM 25 MG TA: 25 | 30 days supply | Qty: 30 | Fill #0

## 2020-07-25 MED FILL — NOVOLIN 70/30 100 UNITS/ML: (70-30) 100 | 14 days supply | Qty: 10 | Fill #0

## 2020-07-25 MED FILL — SUCRALFATE 1 GM TABLET: 1 | 30 days supply | Qty: 60 | Fill #0

## 2020-07-25 NOTE — Care Management (Signed)
1413 07-25-20 MATCH completed for this patient. Medications to be delivered to the room via [facility]. Case Manager will assist in getting patient home via Lyft. Graves-Bigelow, Lamar Laundry, RN, BSN Case Manager

## 2020-07-25 NOTE — Transfer of Care (Signed)
Immediate Anesthesia Transfer of Care Note  Patient: Ryan [name]  Procedure(s) Performed: ESOPHAGOGASTRODUODENOSCOPY (EGD) (N/A ) BIOPSY  Patient Location: Endoscopy Unit  Anesthesia Type:MAC  Level of Consciousness: awake and patient cooperative  Airway & Oxygen Therapy: Patient Spontanous Breathing and Patient connected to nasal cannula oxygen  Post-op Assessment: Report given to RN and Post -op Vital signs reviewed and stable  Post vital signs: Reviewed and stable  Last Vitals:  Vitals Value Taken Time  BP    Temp    Pulse    Resp    SpO2      Last Pain:  Vitals:   07/25/20 0852  TempSrc: Oral  PainSc: 0-No pain      Patients Stated Pain Goal: 0 (70/35/00 9381)  Complications: No complications documented.

## 2020-07-25 NOTE — Progress Notes (Signed)
Progress Note  Patient Name: Ryan [name] Date of Encounter: 07/25/2020  [facility] Cardiologist: Chrystie Nose, MD   Subjective   No acute overnight events. Still having occasional heartburn symptoms but much better than yesterday. No current symptoms. No shortness of breath or palpitations. No recurrent atrial flutter.   Plan is for EGD today.  Inpatient Medications    Scheduled Meds: . diltiazem  240 mg Oral Daily  . insulin aspart  0-20 Units Subcutaneous TID WC  . insulin aspart  0-5 Units Subcutaneous QHS  . insulin aspart  5 Units Subcutaneous TID PC  . insulin glargine  40 Units Subcutaneous QHS  . pantoprazole  40 mg Oral BID  . sodium chloride flush  3 mL Intravenous Q12H  . sucralfate  2 g Oral TID AC   Continuous Infusions: . sodium chloride     PRN Meds: sodium chloride, acetaminophen, ALPRAZolam, alum & mag hydroxide-simeth, nitroGLYCERIN, ondansetron (ZOFRAN) IV, sodium chloride flush, zolpidem   Vital Signs    Vitals:   07/24/20 1100 07/24/20 1630 07/24/20 2126 07/25/20 0558  BP: (!) 154/93 (!) 135/92 (!) 148/84 (!) 148/94  Pulse: 96 73 87 72  Resp: 16 18 17 16   Temp: 98 F (36.7 C) 97.9 F (36.6 C) 98 F (36.7 C) 97.8 F (36.6 C)  TempSrc: Oral Oral Oral Oral  SpO2: 99% 98% 100% 98%  Weight:    98.9 kg  Height:        Intake/Output Summary (Last 24 hours) at 07/25/2020 0716 Last data filed at 07/25/2020 0300 Gross per 24 hour  Intake 486 ml  Output 251 ml  Net 235 ml   Last 3 Weights 07/25/2020 07/23/2020 07/22/2020  Weight (lbs) 218 lb 1.1 oz 219 lb 12.8 oz 222 lb 8 oz  Weight (kg) 98.916 kg 99.701 kg 100.925 kg      Telemetry    Normal sinus rhythm with rates in the 60's to 80's. - Personally Reviewed  ECG    No new ECG tracing today. - Personally Reviewed  Physical Exam   GEN: No acute distress.   Neck: Supple. No JVD. Cardiac: RRR. No murmurs, rubs, or gallops.  Respiratory: Clear to auscultation bilaterally. No  wheezes, rhonchi, or rales. GI: Soft, non-tender, non-distended. Bowel sounds present.  MS: No lower extremity edema. No deformity. Skin: Warm and dry. Neuro:  No focal deficits. Psych: Normal affect.   Labs    High Sensitivity Troponin:   Recent Labs  Lab 07/22/20 0900 07/22/20 1427 07/22/20 1745 07/24/20 0955  TROPONINIHS 17 21* 21* 13      Chemistry Recent Labs  Lab 07/22/20 0900 07/22/20 0900 07/22/20 1101 07/23/20 0503 07/24/20 0347  NA 138   < > 140 136 135  K 3.8   < > 3.5 3.5 3.5  CL 101  --   --  102 100  CO2 19*  --   --  24 25  GLUCOSE 300*  --   --  292* 247*  BUN 11  --   --  13 13  CREATININE 1.55*  --   --  1.35* 1.39*  CALCIUM 9.5  --   --  8.6* 8.3*  PROT  --   --   --  6.4*  --   ALBUMIN  --   --   --  2.9*  --   AST  --   --   --  15  --   ALT  --   --   --  17  --   ALKPHOS  --   --   --  86  --   BILITOT  --   --   --  0.5  --   GFRNONAA 49*  --   --  58* 56*  GFRAA 57*  --   --  >60 >60  ANIONGAP 18*  --   --  10 10   < > = values in this interval not displayed.     Hematology Recent Labs  Lab 07/22/20 0900 07/22/20 1101  WBC 8.5  --   RBC 5.86*  --   HGB 15.8 14.3  HCT 48.9 42.0  MCV 83.4  --   MCH 27.0  --   MCHC 32.3  --   RDW 15.0  --   PLT 254  --     BNPNo results for input(s): BNP, PROBNP in the last 168 hours.   DDimer No results for input(s): DDIMER in the last 168 hours.   Radiology    CT Angio Chest/Abd/Pel for Dissection W and/or W/WO  Result Date: 07/24/2020 CLINICAL DATA:  Chest and back pain. EXAM: CT ANGIOGRAPHY CHEST, ABDOMEN AND PELVIS TECHNIQUE: Non-contrast CT of the chest was initially obtained. Multidetector CT imaging through the chest, abdomen and pelvis was performed using the standard protocol during bolus administration of intravenous contrast. Multiplanar reconstructed images and MIPs were obtained and reviewed to evaluate the vascular anatomy. CONTRAST:  OMNIPAQUE IOHEXOL 350 MG/ML SOLN  COMPARISON:  CT chest 05/31/2019 and CT abdomen 07/13/2019 FINDINGS: CTA CHEST FINDINGS Cardiovascular: The heart is normal in size. No pericardial effusion. The aorta is normal in caliber. No dissection. Minimal atherosclerotic calcification at the aortic arch. A few scattered coronary artery calcifications are noted. The pulmonary arteries appear normal. No filling defects to suggest pulmonary embolism. Mediastinum/Nodes: Dilated fluid-filled esophagus. No obvious mass or inflammatory changes. Moderate-sized hiatal hernia with probable reflux. The stomach is distended with fluid. Small scattered mediastinal and hilar lymph nodes but no mass or adenopathy. Lungs/Pleura: Streaky bibasilar atelectasis. No infiltrates, edema or effusions. No worrisome pulmonary lesions. Musculoskeletal: No significant bony findings. Review of the MIP images confirms the above findings. CTA ABDOMEN AND PELVIS FINDINGS VASCULAR Aorta: Normal caliber. No atherosclerotic calcifications or dissection. Celiac: Normal SMA: Normal Renals: Minimal calcification involving the proximal left renal artery. IMA: Normal Inflow: Normal Veins: No gross abnormality. Review of the MIP images confirms the above findings. NON-VASCULAR Hepatobiliary: No hepatic lesions or intrahepatic biliary dilatation. The gallbladder is mildly contracted. No common bile duct dilatation. Pancreas: No mass, inflammation or ductal dilatation. Spleen: Normal size. No focal lesions. Adrenals/Urinary Tract: The adrenal glands and kidneys are unremarkable. The bladder is normal. Stomach/Bowel: The stomach is moderately distended with fluid. The duodenum, small bowel and colon are grossly normal. No acute inflammatory changes, mass lesions or obstructive findings. The terminal ileum is normal. The appendix is normal. Moderate colonic diverticulosis but no findings for acute diverticulitis. Lymphatic: No mesenteric or retroperitoneal mass or adenopathy. Reproductive: The  prostate gland and seminal vesicles are unremarkable. Other: Small periumbilical abdominal wall hernia containing fat. Musculoskeletal: No significant bony findings. Review of the MIP images confirms the above findings. IMPRESSION: 1. Normal thoracic and abdominal aorta. No aneurysm or dissection. 2. No CT findings for pulmonary embolism. 3. Moderate-sized hiatal hernia with probable reflux. Moderately dilated fluid-filled esophagus. This may account for the patient's chest symptoms. 4. No acute abdominal/pelvic findings, mass lesions or adenopathy. 5. Diffuse colonic diverticulosis but no findings for acute  diverticulitis. 6. Electronically Signed   By: Rudie MeyerP.  Gallerani M.D.   On: 07/24/2020 10:58    Cardiac Studies   Echocardiogram 07/22/2020: Impressions: 1. Left ventricular ejection fraction, by estimation, is 65 to 70%. The  left ventricle has hyperdynamic function. The left ventricle has no  regional wall motion abnormalities. There is mild concentric left  ventricular hypertrophy. Left ventricular  diastolic function could not be evaluated.  2. Right ventricular systolic function is normal. The right ventricular  size is normal.  3. Left atrial size was mildly dilated.  4. The mitral valve is normal in structure. No evidence of mitral valve  regurgitation.  5. The aortic valve is tricuspid. Aortic valve regurgitation is not  visualized. Mild aortic valve sclerosis is present, with no evidence of  aortic valve stenosis.  _______________  Chest CTA 07/24/2020: Impression: 1. Normal thoracic and abdominal aorta. No aneurysm or dissection. 2. No CT findings for pulmonary embolism. 3. Moderate-sized hiatal hernia with probable reflux. Moderately dilated fluid-filled esophagus. This may account for the patient's chest symptoms. 4. No acute abdominal/pelvic findings, mass lesions or adenopathy. 5. Diffuse colonic diverticulosis but no findings for acute diverticulitis.  Patient  Profile     59 y.o. male with a history of chest pain secondary to cocaine use, hypertension, diabetes mellitus, GERD, CKD stage III, COVID infection in 05/2019, and drug use who was admitted on 05/22/2020 for atrial flutter with RVR.  Assessment & Plan    Atrial Flutter with RVR -Maintaining sinus rhythm with rates in the the 60's to 80's.  - Electrolytes within normal limits. - TSH normal. - Echo showed LVEF of 65-70% with normal wall motion and mild LVH. - UDS positive for cocaine. - Continue Cardizem CD 240mg  daily. - CHA2DS2-VASc = 2 (HTN, DM). Started on Eliquis 5mg  twice daily. Currently being held for EGD today. Will restart prior to discharge assuming OK with GI.  Demand Ischemia - High-sensitivity troponin minimally elevated but flat at 17 >> 21 >> 21 >> 13. Not consistent with ACS. - EKG this morning shows lateral T wave inversions.  - Echo with normal LVEF and normal wall motions. - Chest CTA negative aortic dissection. - Still having some heartburn symptoms but no angina. - Suspect demand ischemia in setting of atrial flutter with RVR, elevated BP, and cocaine use.  GERD/Esophagitis - Patient had severe chest pain yesterday morning that he described as "burning." Pain was so severe that we actually got a chest CTA to make sure he was not having an aortic dissection. CTA was negative. Felt to be due to esophagitis. GI consulted and planning EGD today. - Continue Protonix. - Continue Carafate. - Further recommendations per GI.  Hypertension  - BP mildly elevated. - Continue PO Cardizem 240mg  daily.  - Consider adding ACEi/ARB for additional BP control and renal protection given diabetes if renal function is stable. Today's BMET pending.  Uncontrolled Type 2 Diabetes Mellitus - Hemoglobin A1c 12.8.  - Anion gap on admission. Slightly elevated beta-hydroxybutyric acid.UA showed >/= 500 glucose, with 5 ketones, and 100 protein as well as small hemoglobin. - Patient  states he was previously on Metformin and tolerated this well but this was stopped when he was "locked up."  - Diabetes Coordinator consulted and recommendations being followed. - Of note, patient reportedly no longer able to obtain insulin from the health department and only buys from [employer] when he has the finances. Case Manager aware. Patient also does not currently have a glucose meter. Diabetes  Coordinator stated she would bring meter with strips to patient.   Cocaine Abuse - UDS positive for cocaine. - Discussed importance of complete cessation.  CKD Stage III - Creatinine stable at 1.39 yesterday. Was 1.55 on admission. - Today's BMET pending.   For questions or updates, please contact [facility] Please consult [email or web address] for contact info under        Signed, Corrin Parker, PA-C  07/25/2020, 7:16 AM

## 2020-07-25 NOTE — Anesthesia Procedure Notes (Signed)
Procedure Name: MAC Date/Time: 07/25/2020 9:26 AM Performed by: Orlie Dakin, CRNA Pre-anesthesia Checklist: Patient identified, Emergency Drugs available, Suction available and Patient being monitored Patient Re-evaluated:Patient Re-evaluated prior to induction Oxygen Delivery Method: Nasal cannula Preoxygenation: Pre-oxygenation with 100% oxygen Induction Type: IV induction Placement Confirmation: positive ETCO2

## 2020-07-25 NOTE — Progress Notes (Signed)
   Initial plan was for discharge today after EGD. However, patient has no one to drive him home. Therefore, will keep patient overnight per anesthesia protocol. Medications have already been sent to [facility] pharmacy and delivered to patient. Will send message to weekend APPs so they are aware of discharge tomorrow.  Corrin Parker, PA-C 07/25/2020 3:11 PM

## 2020-07-25 NOTE — Brief Op Note (Signed)
07/22/2020 - 07/25/2020  9:36 AM  PATIENT:  Ryan [name]  59 y.o. male  PRE-OPERATIVE DIAGNOSIS:  esophagitis  POST-OPERATIVE DIAGNOSIS:  esophagitis, biopsy stomach and esophagus   PROCEDURE:  Procedure(s): ESOPHAGOGASTRODUODENOSCOPY (EGD) (N/A) BIOPSY  SURGEON:  Surgeon(s) and Role:    * Marceil Welp, MD - Primary  Findings ------------ -EGD showed severe LA grade D erosive esophagitis in the mid and distal esophagus.  Biopsies taken.  Mild gastritis.  Biopsies taken.  Recommendations -------------------------- -Recommend Protonix 40 mg twice a day and Carafate 1 g twice a day for 2 months, followed by Protonix 40 mg once a day. -Start soft diet and slowly advance as tolerated -Okay to resume anticoagulation from GI standpoint -No further inpatient GI work-up planned.  Follow-up with Dr. Dulce Sellar in 3 months to arrange for repeat EGD and colonoscopy -GI will sign off.  Call us back if needed  Kathi Der MD, FACP 07/25/2020, 9:37 AM  Contact #  [phone]

## 2020-07-25 NOTE — Op Note (Addendum)
[facility] Patient Name: Ryan [name] Procedure Date : 07/25/2020 MRN: [id number] Attending MD: Kathi DerParag Jakera Beaupre , MD Date of Birth: 1961/11/09 CSN: [id number] Age: 59 Admit Type: Inpatient Procedure:                Upper GI endoscopy Indications:              Heartburn, Follow-up of esophagitis Providers:                Kathi DerParag Lometa Riggin, MD, Adolph PollackElizabeth Mathai, RN, Glory RosebushJulie                            Gibson, RN, Beryle BeamsJanie Billups, Technician, Teresita MaduraPaul Welty                            CRNA Referring MD:              Medicines:                Sedation Administered by an Anesthesia Professional Complications:            No immediate complications. Estimated Blood Loss:     Estimated blood loss was minimal. Estimated blood                            loss was minimal. Procedure:                Pre-Anesthesia Assessment:                           - Prior to the procedure, a History and Physical                            was performed, and patient medications and                            allergies were reviewed. The patient's tolerance of                            previous anesthesia was also reviewed. The risks                            and benefits of the procedure and the sedation                            options and risks were discussed with the patient.                            All questions were answered, and informed consent                            was obtained. Prior Anticoagulants: The patient has                            taken Eliquis (apixaban), last dose was 1 day prior  to procedure. ASA Grade Assessment: III - A patient                            with severe systemic disease. After reviewing the                            risks and benefits, the patient was deemed in                            satisfactory condition to undergo the procedure.                           After obtaining informed consent, the endoscope was                             passed under direct vision. Throughout the                            procedure, the patient's blood pressure, pulse, and                            oxygen saturations were monitored continuously. The                            GIF-H190 ([id number]) Olympus gastroscope was                            introduced through the mouth, and advanced to the                            second part of duodenum. The upper GI endoscopy was                            accomplished without difficulty. The patient                            tolerated the procedure well. Scope In: Scope Out: Findings:      LA Grade D (one or more mucosal breaks involving at least 75% of       esophageal circumference) esophagitis with no bleeding was found in the       Mid and distal esophagus. Biopsies were taken with a cold forceps for       histology.      A large ( 8 cm long) hiatal hernia was present.      Scattered mild inflammation characterized by congestion (edema) and       erythema was found in the entire examined stomach. Biopsies were taken       with a cold forceps for histology.      The cardia and gastric fundus were normal on retroflexion.      Scattered mild inflammation was found in the duodenal bulb.      The first portion of the duodenum and second portion of the duodenum       were normal. Impression:               -  LA Grade D erosive esophagitis with no bleeding.                            Biopsied.                           - Large hiatal hernia.                           - Gastritis. Biopsied.                           - Duodenitis.                           - Normal first portion of the duodenum and second                            portion of the duodenum. Recommendation:           - Return patient to hospital ward for ongoing care.                           - Soft diet.                           - Continue present medications. Protonix 40 mg                            twice  daily and Carafate 1 g twice daily.                           - Await pathology results.                           - Return to GI clinic/ Dr. Dulce Sellar in 2 months.                           - Repeat upper endoscopy in 3 months to check                            healing. Procedure Code(s):        --- Professional ---                           [phone], Esophagogastroduodenoscopy, flexible,                            transoral; with biopsy, single or multiple Diagnosis Code(s):        --- Professional ---                           K20.80, Other esophagitis without bleeding                           K44.9, Diaphragmatic hernia without obstruction or  gangrene                           K29.70, Gastritis, unspecified, without bleeding                           K29.80, Duodenitis without bleeding                           R12, Heartburn CPT copyright 2019 American Medical Association. All rights reserved. The codes documented in this report are preliminary and upon coder review may  be revised to meet current compliance requirements. Kathi Der, MD Kathi Der, MD 07/25/2020 9:36:16 AM Number of Addenda: 0

## 2020-07-25 NOTE — Anesthesia Preprocedure Evaluation (Addendum)
Anesthesia Evaluation  Patient identified by MRN, date of birth, ID band Patient awake    Reviewed: Allergy & Precautions, NPO status , Patient's Chart, lab work & pertinent test results  Airway Mallampati: III  TM Distance: >3 FB     Dental  (+) Teeth Intact, Dental Advisory Given   Pulmonary    breath sounds clear to auscultation       Cardiovascular hypertension,  Rhythm:Regular Rate:Normal     Neuro/Psych    GI/Hepatic   Endo/Other  diabetes  Renal/GU      Musculoskeletal   Abdominal   Peds  Hematology   Anesthesia Other Findings   Reproductive/Obstetrics                             Anesthesia Physical Anesthesia Plan  ASA: III  Anesthesia Plan: MAC   Post-op Pain Management:    Induction: Intravenous  PONV Risk Score and Plan: Ondansetron and Propofol infusion  Airway Management Planned: Natural Airway and Nasal Cannula  Additional Equipment:   Intra-op Plan:   Post-operative Plan:   Informed Consent: I have reviewed the patients History and Physical, chart, labs and discussed the procedure including the risks, benefits and alternatives for the proposed anesthesia with the patient or authorized representative who has indicated his/her understanding and acceptance.       Plan Discussed with: CRNA and Anesthesiologist  Anesthesia Plan Comments:         Anesthesia Quick Evaluation

## 2020-07-25 NOTE — Progress Notes (Signed)
Inpatient Diabetes Program Recommendations  AACE/ADA: New Consensus Statement on Inpatient Glycemic Control (2015)  Target Ranges:  Prepandial:   less than 140 mg/dL      Peak postprandial:   less than 180 mg/dL (1-2 hours)      Critically ill patients:  140 - 180 mg/dL   Lab Results  Component Value Date   GLUCAP 184 (H) 07/25/2020   HGBA1C 12.8 (H) 07/22/2020    Review of Glycemic Control  Diabetes history: type 2 Outpatient Diabetes medications: Novolin Relion 70/30 insulin 20 units BID Current orders for Inpatient glycemic control: Lantus 40 units at HS, Novolog RESIST correction scale TID & HS scale, Novolog 5 units TID  Inpatient Diabetes Program Recommendations:   Recommend home dosage for insulin: Novolin Relion 70/30 35 units BID which is close to the dosage of Lantus 40 units daily that he has been on most recently in the hospital. Patient was given a home blood glucose meter, strips, and lancets by diabetes coordinator.  Smith Mince RN BSN CDE Diabetes Coordinator Pager: [phone]  8am-5pm

## 2020-07-25 NOTE — Discharge Summary (Signed)
Discharge Summary    Patient ID: Ryan [name] MRN: [id number]; DOB: 05/28/1961  Admit date: 07/22/2020 Discharge date: 07/25/2020  Primary Care Provider: Lavinia SharpsPlacey, Mary Ann, NP  Primary Cardiologist: Chrystie NoseKenneth C Hilty, MD  Primary Electrophysiologist:  None   Discharge Diagnoses    Principal Problem:   Atrial flutter with rapid ventricular response [facility](HCC) Active Problems:   Uncontrolled hypertension   Diabetes mellitus type 2, insulin dependent (HCC)   Midepigastric pain   Esophagitis    Diagnostic Studies/Procedures    Echocardiogram 07/22/2020: Impressions: 1. Left ventricular ejection fraction, by estimation, is 65 to 70%. The  left ventricle has hyperdynamic function. The left ventricle has no  regional wall motion abnormalities. There is mild concentric left  ventricular hypertrophy. Left ventricular  diastolic function could not be evaluated.  2. Right ventricular systolic function is normal. The right ventricular  size is normal.  3. Left atrial size was mildly dilated.  4. The mitral valve is normal in structure. No evidence of mitral valve  regurgitation.  5. The aortic valve is tricuspid. Aortic valve regurgitation is not  visualized. Mild aortic valve sclerosis is present, with no evidence of  aortic valve stenosis.  _______________  Chest CTA 07/24/2020: Impression: 1. Normal thoracic and abdominal aorta. No aneurysm or dissection. 2. No CT findings for pulmonary embolism. 3. Moderate-sized hiatal hernia with probable reflux. Moderately dilated fluid-filled esophagus. This may account for the patient's chest symptoms. 4. No acute abdominal/pelvic findings, mass lesions or adenopathy. 5. Diffuse colonic diverticulosis but no findings for acute diverticulitis.   History of Present Illness     Ryan [name] is a 59 y.o. male with a history of chest pain secondary to cocaine use, hypertension, diabetes mellitus, GERD with esophagitis, CKD stage  III, COVID infection in 05/2019, and drug use who was admitted on 05/22/2020 for atrial flutter with RVR.  Patient does not see a medical provider on a regular basis. However, he follows with [facility]. He has had infrequent ED visit for a variety of complaints.  Patient reported onset of palpitations with feeling of heart racing about 3 days prior to presentation that has been constant since that time. He noted associated lightheadedness and dizziness but no syncope. He also reported some chest pain. He has a history of severe reflux and has been out of his reflux medications for several weeks. No associated shortness of breath. He also reported general malaise and just report not feeling well and not feeling like he could do anything. He does have a history of cocaine but reported not having used any in the last few days. Finally, symptoms got so bad that he came to the ED for further evaluation. He was found to be in atrial flutter with RVR. UDS positive for cocaine.  Hospital Course     Consultants: GI  Atrial Flutter with RVR Patient admitted with atrial flutter with RVR. Electrolytes within normal limits and TSH normal. He was started on IV Cardizem. CHA2DS2-VASc =2 (HTN and DM). Eliquis. Echo showed LVEF of 65-75% with normal wall motion. Fortunately, patient converted back to sinus rhythm on the IV Cardizem. He was transitioned to PO Cardizem CD 240mg  daily and has maintained sinus rhythm with this. Will continue this and Eliquis 5mg  twice daily at discharge. Patient currently does not have insurance and will need to complete Patient Assistance form. Pharmacy helped start filling this form out for patient and placed in paper chart - patient to complete it and bring to follow-up visit.  Discussed the importance of complete cessation of cocaine.  Chest Pain Demand Ischemia Patient presented with burning chest pain felt to be due to GERD/esophagitis. EKG once back in sinus rhythm  showed lateral T wave inversions. High-sensitivity troponin minimally elevated but flat at 17 >> 21 >> 21. Not consistent wit ACS. Felt to be demand ischemia in setting of atrial flutter and elevated BP (also may have some coronary spasms due to cocaine use). Echo with normal LVEF and normal wall motions. On morning of 07/24/2020, patient developed severe 10/10 chest pain that he described as burning sensation. Pain was so severe that he was writhing around in pain and tearful. He was given sublingual Nitro, IV Morphine, PO Pepcid, and then Carafate suspension with patient finally receiving a little relief after the Carafate. Repeat high-sensitivity troponin came back at 13 and repeat EKG showed no changes. Pain was so severe that chest CTA was ordered to rule out aortic dissection and thankfully this was negative for aneurysm/dissection but did show moderate sized hiatal hernia with probable reflux and moderately dilated fluid-filled esophagus. GI was consulted - please see below for further details.  GERD/Esophagitis As noted above, patient developed severe episode of heartburn on morning of 10/10. Lipase and LFTs normal this admission. GI consulted. Protonix and Carafate started with improvement in symptoms. EGD on 07/25/2020 which showed severe LA grade D erosive esophagitis in the mid and distal esophagus as well as mild gastritis. Biopsies taken. GI recommended Protonix  twice daily and Carafate 1g twice daily for 2 months followed by Protonix  once daily. Patient to follow-up with GI as outpatient.  Hypertension BP elevated on arrival as high as 180s/90s. Improved on Cardizem but still mildly elevated at times. Patient had Amlodipine, Coreg and Lisinpril-HCTZ listed under PTA medications. Patient did state he was taking something for BP at home but unclear which one. All of these medications were held on admission and patient was started on Cardizem for rate control of atrial flutter.  Beta-blocker not felt to be a safe option due to cocaine use. ContinuePO Cardizem  daily. Losartan  daily added for additional BP control and renal protection given diabetes. Will need repeat BMET at follow-up visit.  Uncontrolled Type 2 Diabetes Mellitus Hemoglobin A1c 12.8 on admission. Mild anion gap on admission (resolved on repeat labs). Slightly elevated beta-hydroxybutyric acid.UA showed >/= 500 glucose, with 5 ketones, and 100 protein as well as small hemoglobin. Patient supposed to be on 20 units of Novolin 70/30 twice daily at home. However, he has not been taking this recently as he is no longer able to obtain insulin from the health department only buys from [facility] when he has the finances. Diabetes Coordinator was consulted and assisted with inpatient management. Recommended increasing home Novolin to 35 units twice daily at discharge. Will send to [facility] pharmacy. Diabetes Coordinator also provide patient with meter and strips. Patient follows with Lavinia Sharps, NP, at [facility]. Emphasized the importance of following up with her for further management of diabetes.  Cocaine Abuse Urine drug screen positive for cocaine. Discussed importance of complete cessation.  CKD Stage III Creatinine1.55 on admission and stable at 1.40 today. This seems to be around patient's baseline. Will need repeat BMET at follow-up after starting Losartan.  Patient seen and examined today by Dr. Flora Lipps and determined to be stable for discharge. Outpatient follow-up has been arranged. Medications as below. Sent medications to [facility] pharmacy.  Did the patient have an acute coronary syndrome (MI,  NSTEMI, STEMI, etc) this admission?:  No.   The elevated Troponin was due to the acute medical illness (demand ischemia).  _____________  Discharge Vitals Blood pressure (!) 139/101, pulse (!) 59, temperature 98 F (36.7 C), temperature source Oral, resp. rate 16, height  (1.753 m),  weight 101.2 kg, SpO2 99 %.  Filed Weights   07/23/20 0500 07/25/20 0558 07/25/20 0852  Weight: 99.7 kg 98.9 kg 101.2 kg    Labs & Radiologic Studies    CBC No results for input(s): WBC, NEUTROABS, HGB, HCT, MCV, PLT in the last 72 hours. Basic Metabolic Panel Recent Labs    96/04/54 0347 07/25/20 0817  NA 135 136  K 3.5 3.6  CL 100 103  CO2 25 23  GLUCOSE 247* 182*  BUN 13 13  CREATININE 1.39* 1.40*  CALCIUM 8.3* 8.6*   Liver Function Tests Recent Labs    07/23/20 0503  AST 15  ALT 17  ALKPHOS 86  BILITOT 0.5  PROT 6.4*  ALBUMIN 2.9*   Recent Labs    07/24/20 0955  LIPASE 33   High Sensitivity Troponin:   Recent Labs  Lab 07/22/20 0900 07/22/20 1427 07/22/20 1745 07/24/20 0955  TROPONINIHS 17 21* 21* 13    BNP Invalid input(s): POCBNP D-Dimer No results for input(s): DDIMER in the last 72 hours. Hemoglobin A1C Recent Labs    07/22/20 1427  HGBA1C 12.8*   Fasting Lipid Panel No results for input(s): CHOL, HDL, LDLCALC, TRIG, CHOLHDL, LDLDIRECT in the last 72 hours. Thyroid Function Tests No results for input(s): TSH, T4TOTAL, T3FREE, THYROIDAB in the last 72 hours.  Invalid input(s): FREET3 _____________  DG Chest Port 1 View  Result Date: 07/22/2020 CLINICAL DATA:  Chest pain with cardiac palpitations EXAM: PORTABLE CHEST 1 VIEW COMPARISON:  May 29, 2019 FINDINGS: Lungs are clear. Heart is borderline enlarged with pulmonary vascularity normal. No adenopathy. No bone lesions. IMPRESSION: Stable cardiac prominence.  Lungs clear.  No evident adenopathy. Electronically Signed   By: Bretta Bang III M.D.   On: 07/22/2020 09:34   ECHOCARDIOGRAM COMPLETE  Result Date: 07/22/2020    ECHOCARDIOGRAM REPORT   Patient Name:   VANNA SHAVERS Date of Exam: 07/22/2020 Medical Rec #:  [id number]        Height:       69.0 in Accession #:    [id number]       Weight:       223.0 lb Date of Birth:  1961/10/18       BSA:          2.164 m Patient Age:    58  years         BP:           151/99 mmHg Patient Gender: M                HR:           116 bpm. Exam Location:  Inpatient Procedure: 2D Echo Indications:    atrial flutter 427.32  History:        Patient has no prior history of Echocardiogram examinations.                 Chronic kidney disease, Arrythmias:Atrial Flutter; Risk                 Factors:Hypertension and Diabetes.  Sonographer:    Delcie Roch Referring Phys: [id number] RHONDA G BARRETT IMPRESSIONS  1. Left ventricular ejection fraction, by estimation, is 65  to 70%. The left ventricle has hyperdynamic function. The left ventricle has no regional wall motion abnormalities. There is mild concentric left ventricular hypertrophy. Left ventricular diastolic function could not be evaluated.  2. Right ventricular systolic function is normal. The right ventricular size is normal.  3. Left atrial size was mildly dilated.  4. The mitral valve is normal in structure. No evidence of mitral valve regurgitation.  5. The aortic valve is tricuspid. Aortic valve regurgitation is not visualized. Mild aortic valve sclerosis is present, with no evidence of aortic valve stenosis. FINDINGS  Left Ventricle: Left ventricular ejection fraction, by estimation, is 65 to 70%. The left ventricle has hyperdynamic function. The left ventricle has no regional wall motion abnormalities. The left ventricular internal cavity size was normal in size. There is mild concentric left ventricular hypertrophy. Left ventricular diastolic function could not be evaluated due to atrial fibrillation. Left ventricular diastolic function could not be evaluated. Right Ventricle: The right ventricular size is normal. No increase in right ventricular wall thickness. Right ventricular systolic function is normal. Left Atrium: Left atrial size was mildly dilated. Right Atrium: Right atrial size was normal in size. Pericardium: There is no evidence of pericardial effusion. Mitral Valve: The mitral valve is  normal in structure. No evidence of mitral valve regurgitation. Tricuspid Valve: The tricuspid valve is normal in structure. Tricuspid valve regurgitation is not demonstrated. Aortic Valve: The aortic valve is tricuspid. Aortic valve regurgitation is not visualized. Mild aortic valve sclerosis is present, with no evidence of aortic valve stenosis. Pulmonic Valve: The pulmonic valve was normal in structure. Pulmonic valve regurgitation is not visualized. Aorta: The aortic root is normal in size and structure. IAS/Shunts: No atrial level shunt detected by color flow Doppler.  LEFT VENTRICLE PLAX 2D LVIDd:         4.20 cm LVIDs:         2.00 cm LV PW:         1.30 cm LV IVS:        1.10 cm LVOT diam:     1.90 cm LVOT Area:     2.84 cm  IVC IVC diam: 1.50 cm LEFT ATRIUM             Index       RIGHT ATRIUM           Index LA diam:        4.20 cm 1.94 cm/m  RA Area:     11.80 cm LA Vol (A2C):   58.4 ml 26.99 ml/m RA Volume:   25.20 ml  11.65 ml/m LA Vol (A4C):   65.4 ml 30.22 ml/m LA Biplane Vol: 64.6 ml 29.85 ml/m   AORTA Ao Root diam: 3.00 cm Ao Asc diam:  3.20 cm  SHUNTS Systemic Diam: 1.90 cm Rachelle Hora Croitoru MD Electronically signed by Thurmon Fair MD Signature Date/Time: 07/22/2020/4:13:40 PM    Final    CT Angio Chest/Abd/Pel for Dissection W and/or W/WO  Result Date: 07/24/2020 CLINICAL DATA:  Chest and back pain. EXAM: CT ANGIOGRAPHY CHEST, ABDOMEN AND PELVIS TECHNIQUE: Non-contrast CT of the chest was initially obtained. Multidetector CT imaging through the chest, abdomen and pelvis was performed using the standard protocol during bolus administration of intravenous contrast. Multiplanar reconstructed images and MIPs were obtained and reviewed to evaluate the vascular anatomy. CONTRAST:  OMNIPAQUE IOHEXOL 350 MG/ML SOLN COMPARISON:  CT chest 05/31/2019 and CT abdomen 07/13/2019 FINDINGS: CTA CHEST FINDINGS Cardiovascular: The heart is normal in size. No  pericardial effusion. The aorta is normal  in caliber. No dissection. Minimal atherosclerotic calcification at the aortic arch. A few scattered coronary artery calcifications are noted. The pulmonary arteries appear normal. No filling defects to suggest pulmonary embolism. Mediastinum/Nodes: Dilated fluid-filled esophagus. No obvious mass or inflammatory changes. Moderate-sized hiatal hernia with probable reflux. The stomach is distended with fluid. Small scattered mediastinal and hilar lymph nodes but no mass or adenopathy. Lungs/Pleura: Streaky bibasilar atelectasis. No infiltrates, edema or effusions. No worrisome pulmonary lesions. Musculoskeletal: No significant bony findings. Review of the MIP images confirms the above findings. CTA ABDOMEN AND PELVIS FINDINGS VASCULAR Aorta: Normal caliber. No atherosclerotic calcifications or dissection. Celiac: Normal SMA: Normal Renals: Minimal calcification involving the proximal left renal artery. IMA: Normal Inflow: Normal Veins: No gross abnormality. Review of the MIP images confirms the above findings. NON-VASCULAR Hepatobiliary: No hepatic lesions or intrahepatic biliary dilatation. The gallbladder is mildly contracted. No common bile duct dilatation. Pancreas: No mass, inflammation or ductal dilatation. Spleen: Normal size. No focal lesions. Adrenals/Urinary Tract: The adrenal glands and kidneys are unremarkable. The bladder is normal. Stomach/Bowel: The stomach is moderately distended with fluid. The duodenum, small bowel and colon are grossly normal. No acute inflammatory changes, mass lesions or obstructive findings. The terminal ileum is normal. The appendix is normal. Moderate colonic diverticulosis but no findings for acute diverticulitis. Lymphatic: No mesenteric or retroperitoneal mass or adenopathy. Reproductive: The prostate gland and seminal vesicles are unremarkable. Other: Small periumbilical abdominal wall hernia containing fat. Musculoskeletal: No significant bony findings. Review of the MIP  images confirms the above findings. IMPRESSION: 1. Normal thoracic and abdominal aorta. No aneurysm or dissection. 2. No CT findings for pulmonary embolism. 3. Moderate-sized hiatal hernia with probable reflux. Moderately dilated fluid-filled esophagus. This may account for the patient's chest symptoms. 4. No acute abdominal/pelvic findings, mass lesions or adenopathy. 5. Diffuse colonic diverticulosis but no findings for acute diverticulitis. 6. Electronically Signed   By: Rudie Meyer M.D.   On: 07/24/2020 10:58   Disposition   Patient is being discharged home today in good condition.  Follow-up Plans & Appointments     Follow-up Information    Willis Modena, MD. Schedule an appointment as soon as possible for a visit in 6 week(s).   Specialty: Gastroenterology Why: Follow-up to discuss repeat EGD and colonoscopy. Contact information: [address] [place] [address] [phone]        Corrin Parker, PA-C Follow up.   Specialties: Physician Assistant, Cardiology Why: Hospital follow-up scheduled for 08/11/2020 at 10:45am. Please arrive 15 minutes early for check-in. If this date/time does not work for you, please call our office to reschedule. Contact information: [address] [place] [address] [place] [address] [phone]        Lavinia Sharps, NP. Call.   Why: Please call your primary care provider's office as soon as possible to schedule follow-up visit within the next couple of weeks.  Contact information: [address] [place] [address] [phone]              Discharge Instructions    Diet - low sodium heart healthy   Complete by: As directed    Increase activity slowly   Complete by: As directed       Discharge Medications   Allergies as of 07/25/2020   No Known Allergies     Medication List    STOP taking these medications   amLODipine 10 MG tablet Commonly known as:  NORVASC   carvedilol 25 MG  tablet Commonly known as: COREG   cyclobenzaprine 10 MG tablet Commonly known as: FLEXERIL   famotidine 20 MG tablet Commonly known as: PEPCID   lisinopril-hydrochlorothiazide 20-25 MG tablet Commonly known as: ZESTORETIC   methocarbamol 500 MG tablet Commonly known as: ROBAXIN   Mobic 15 MG tablet Generic drug: meloxicam   Potassium Chloride ER 20 MEQ Tbcr     TAKE these medications   apixaban 5 MG Tabs tablet Commonly known as: ELIQUIS Take 1 tablet (5 mg total) by mouth 2 (two) times daily.   diltiazem 240 MG 24 hr capsule Commonly known as: CARDIZEM CD Take 1 capsule (240 mg total) by mouth daily.   losartan 25 MG tablet Commonly known as: COZAAR Take 1 tablet (25 mg total) by mouth daily.   NovoLIN 70/30 (70-30) 100 UNIT/ML injection Generic drug: insulin NPH-regular Human Inject 35 units into the skin 2 times daily with a meal. What changed: additional instructions   pantoprazole 40 MG tablet Commonly known as: PROTONIX Take 1 tablet (40 mg total) by mouth 2 (two) times daily. What changed:   medication strength  how much to take   SEROquel 50 MG tablet Generic drug: QUEtiapine Take 50 mg by mouth at bedtime.   sucralfate 1 g tablet Commonly known as: Carafate Take 1 tablet (1 g total) by mouth 2 (two) times daily.   ZyrTEC Allergy 10 MG tablet Generic drug: cetirizine Take 10 mg by mouth at bedtime.          Outstanding Labs/Studies   Repeat BMET at follow-up.  Duration of Discharge Encounter   Greater than 30 minutes including physician time.  Signed, Corrin Parker, PA-C 07/25/2020, 1:48 PM

## 2020-07-25 NOTE — Progress Notes (Signed)
Patient ambulated in hall independently with steady gait and no adverse symptoms. He was accompanied by NT.  Stated he felt fine with ambulation. After discussion with anesthesia it was decided that it would be safest and following protocol to have patient stay overnight tonight. Per protocol patients cannot drive, need a ride home and a responsible adult to be with them overnight. Patient notified and verbalized understanding.

## 2020-07-26 ENCOUNTER — Encounter: Payer: Self-pay | Admitting: Physician Assistant

## 2020-07-26 LAB — GLUCOSE, CAPILLARY: Glucose-Capillary: 275 mg/dL — ABNORMAL HIGH (ref 70–99)

## 2020-07-26 NOTE — Anesthesia Postprocedure Evaluation (Signed)
Anesthesia Post Note  Patient: Ryan [name]  Procedure(s) Performed: ESOPHAGOGASTRODUODENOSCOPY (EGD) (N/A ) BIOPSY     Patient location during evaluation: Endoscopy Anesthesia Type: MAC Level of consciousness: awake and alert Pain management: pain level controlled Vital Signs Assessment: post-procedure vital signs reviewed and stable Respiratory status: spontaneous breathing, nonlabored ventilation, respiratory function stable and patient connected to nasal cannula oxygen Cardiovascular status: stable and blood pressure returned to baseline Postop Assessment: no apparent nausea or vomiting Anesthetic complications: no   No complications documented.  Last Vitals:  Vitals:   07/26/20 0528 07/26/20 0742  BP: 124/79 133/83  Pulse: 65 62  Resp: 17 16  Temp: 36.5 C 36.7 C  SpO2: 99%     Last Pain:  Vitals:   07/26/20 0742  TempSrc: Oral  PainSc:                  [name]

## 2020-07-26 NOTE — Progress Notes (Signed)
Patient discharged to home. IV x2 removed catheters intact. Personal belongings including delivered medications packed by patient. Down to front entrance via w/c awaiting Taxi. Voucher in hand. No complaints.

## 2020-07-26 NOTE — Social Work (Signed)
CSW provided taxi voucher for transportation home.

## 2020-07-27 ENCOUNTER — Encounter (HOSPITAL_COMMUNITY): Payer: Self-pay | Admitting: Gastroenterology

## 2020-07-28 LAB — SURGICAL PATHOLOGY

## 2020-08-04 NOTE — Progress Notes (Signed)
Cardiology Office Note:    Date:  08/11/2020   ID:  Ryan [name], DOB [birth year], MRN [id number]  PCP:  Lavinia SharpsPlacey, Mary Ann, NP  Cardiologist:  Chrystie NoseKenneth C Hilty, MD  Electrophysiologist:  None   Referring MD: Lavinia SharpsPlacey, Mary Ann, NP   Chief Complaint: hospital follow-up for atrial flutter  History of Present Illness:    Ryan [name] is a 59 y.o. male with a history of chest pain secondary to cocaine use, recent diagnosis of atrial flutter on Eliquis, hypertension, type 2 diabetes mellitus, GERD with esophagitis, CKD stage III, and COVID infection in 05/2019 who presents today for hospital follow-up for atrial flutter.   Patient recently admitted from 07/22/2020 to 07/25/2020 with atrial flutter with RVR after presenting with palpitations for 3 days. He also reported some chest pain at the time but attributed this to his chronic GERD because he had been out of his medications for several weeks. Electrolytes and TSH normal. UDS positive for cocaine. Echo showed LVEF of 65-75% with normal wall motion. He was started on IV Cardizem and fortunately converted back to normal rhythm. He was transitioned to PO Cardizem 240mg  daily and maintained sinus rhythm with this. CHA2DS2-VASc = 2 (HTN and DM) so he was started on Eliquis. High-sensitivity troponin minimally elevated and flat peaking at 21. This was felt to be due to demand ischemia secondary to atrial flutter with RVR. He had a severe episode of chest pain that he described as burning sensation on morning of 07/24/2020. He was given given sublingual Nitro, IV Morphine, PO Pepcid, and then Carafate suspension with patient finally receiving a little relief after the Carafate. Repeat high-sensitivity troponin was negative and repeat EKG showed no changes. Lipase and LFTs normal. Pain was so severe that chest CTA was ordered to rule out aortic dissection and was negative but did show moderate-sized hiatal hernia with probable reflux. GI was  consulted and EGD was performed on 07/25/2020 which showed severe LA grade D erosive esophagitis in the mid and distal esophagus as well as mild gastritis. Biopsies taken. GI recommended Protonix 40mg  twice daily and Carafate 1g twice daily for 2 months followed by Protonix 40mg  once daily. Patient to follow-up with GI as outpatient. BP markedly elevated on arrival in the 180's/90's but this improved with addition of Cardizem. Losartan 25mg  daily also added for additional BP control and renal protection given diabetes. Hemoglobin A1c 12.8 on admission with mild anion gap (resolved on repeat), slightly elevated beta-hydroxybutryic acid. Patient supposed to be on Novolin 70/30 at home but had not been taking as he was unable to afford it. Diabetes Coordinator was consulted and assisted with inpatient management. Novolin was increased to 35 units twice daily at discharge. Patient was provided with new glucometer, strips, and lancets prior to discharge and was instructed to follow-up with PCP as [facility] for further management.   Patient called our office on 08/07/2020 with reports of severe lower back with radiation down legs as well as some lower extremity edema. Given severity of pain, he was advised to go to the ED for further evaluation. He was seen in the ED as recommended. Lumbar spine x-ray showed progressive degenerative disc disease within the lower thoracic spine and lower lumbar spine but no acute findings. BNP was minimally elevated at 147 and hemoglobin was 12.2 (down from 14.3 on 8/10) but otherwise labs unremarkable. No red flag symptoms. Patient was prescribed Percocet and low dose Lasix 20 mg daily and discharged home.  Patient presents today for follow-up. Patient still having back pain but it has improved some. He has not started the Percocet or Lasix yet because he does not have any money until Wednesday. He is planning on picking them up on Wednesday. He notes lower extremity  edema as well as some abdominal bloating. He has gained 10 lbs since discharge.  No dyspnea though. No orthopnea or PND. He notes some continued heart burn symptoms if he does not take his medications but no chest pain with exertion on anything that sounds like angina. No recurrent palpitations. No hematuria, hematochezia, or melena on the Eliquis.   Past Medical History:  Diagnosis Date   Achilles tendon rupture right   Arthritis    back and shoulders   Atrial flutter with rapid ventricular response (HCC) 07/22/2020   Chest pain    due to cocaine use   Diabetes mellitus without complication (HCC)    Drug abuse (HCC)    cocaine, marijuana abuse   GERD (gastroesophageal reflux disease)    Hypertension    Renal insufficiency     Past Surgical History:  Procedure Laterality Date   ACHILLES TENDON SURGERY Right 05/02/2014   Procedure: RIGHT ACHILLES TENDON REPAIR;  Surgeon: Javier Docker, MD;  Location: [facility] ORS;  Service: Orthopedics;  Laterality: Right;   BIOPSY  07/25/2020   Procedure: BIOPSY;  Surgeon: Kathi Der, MD;  Location: [facility] ENDOSCOPY;  Service: Gastroenterology;;   CARDIAC CATHETERIZATION  05.31.2012   showing patent coronaries with no visualized vasospasm and EF of 55-60 %   ESOPHAGOGASTRODUODENOSCOPY N/A 07/25/2020   Procedure: ESOPHAGOGASTRODUODENOSCOPY (EGD);  Surgeon: Kathi Der, MD;  [facility] END[facility];  Service: Gastroenterology;  Laterality: N/A;   ORBITAL FRACTURE SURGERY Left 2-3 yrs ago    Current Medications: Current Meds  Medication Sig   apixaban (ELIQUIS) 5 MG TABS tablet Take 1 tablet (5 mg total) by mouth 2 (two) times daily.   diltiazem (CARDIZEM CD) 240 MG 24 hr capsule Take 1 capsule (240 mg total) by mouth daily.   furosemide (LASIX) 40 MG tablet Take 1 tablet (40 mg total) by mouth daily.   insulin NPH-regular Human (NOVOLIN 70/30) (70-30) 100 UNIT/ML injection Inject 35 units into the skin 2 times daily with a meal.     losartan (COZAAR) 50 MG tablet Take 1 tablet (50 mg total) by mouth daily.   oxyCODONE-acetaminophen (PERCOCET) 5-325 MG tablet Take 1 tablet by mouth every 6 (six) hours as needed.   pantoprazole (PROTONIX) 40 MG tablet Take 1 tablet (40 mg total) by mouth 2 (two) times daily.   SEROQUEL 50 MG tablet Take 50 mg by mouth at bedtime.   sucralfate (CARAFATE) 1 g tablet Take 1 tablet (1 g total) by mouth 2 (two) times daily.   ZYRTEC ALLERGY 10 MG tablet Take 10 mg by mouth at bedtime.   [DISCONTINUED] furosemide (LASIX) 20 MG tablet Take 1 tablet (20 mg total) by mouth daily.   [DISCONTINUED] losartan (COZAAR) 25 MG tablet Take 1 tablet (25 mg total) by mouth daily.     Allergies:   Patient has no known allergies.   Social History   Socioeconomic History   Marital status: Single    Spouse name: Not on file   Number of children: Not on file   Years of education: Not on file   Highest education level: Not on file  Occupational History   Not on file  Tobacco Use   Smoking status: Never Smoker   Smokeless  tobacco: Never Used  Vaping Use   Vaping Use: Never used  Substance and Sexual Activity   Alcohol use: Yes    Alcohol/week: 3.0 standard drinks    Types: 3 Cans of beer per week    Comment: occassional   Drug use: Yes    Types: Cocaine, Marijuana    Comment: last cocaine use 2 to 3 yrs ago, last marijuana use 4 to 5 years ago   Sexual activity: Not on file  Other Topics Concern   Not on file  Social History Narrative   ** Merged History Encounter **       Pt currently lives with his brother, and his sister in [place]. He is employed by [employer]. He denies tobacco use. He does endorse occasional alcohol use. He does endorse cocaine use.  Family history : Non contributory   Social Determinants of Corporate investment banker Strain:    Difficulty of Paying Living Expenses: Not on file  Food Insecurity:    Worried About Programme researcher, broadcasting/film/video in  the Last Year: Not on file   [employer] of Food in the Last Year: Not on file  Transportation Needs:    Lack of Transportation (Medical): Not on file   Lack of Transportation (Non-Medical): Not on file  Physical Activity:    Days of Exercise per Week: Not on file   Minutes of Exercise per Session: Not on file  Stress:    Feeling of Stress : Not on file  Social Connections:    Frequency of Communication with Friends and Family: Not on file   Frequency of Social Gatherings with Friends and Family: Not on file   Attends Religious Services: Not on file   Active Member of Clubs or Organizations: Not on file   Attends Banker Meetings: Not on file   Marital Status: Not on file     Family History: The patient's family history includes Unexplained death in his father; Unexplained death (age of onset: 78) in his mother.  ROS:   Please see the history of present illness.     EKGs/Labs/Other Studies Reviewed:    The following studies were reviewed today:  Echocardiogram 07/22/2020: Impressions: 1. Left ventricular ejection fraction, by estimation, is 65 to 70%. The  left ventricle has hyperdynamic function. The left ventricle has no  regional wall motion abnormalities. There is mild concentric left  ventricular hypertrophy. Left ventricular  diastolic function could not be evaluated.  2. Right ventricular systolic function is normal. The right ventricular  size is normal.  3. Left atrial size was mildly dilated.  4. The mitral valve is normal in structure. No evidence of mitral valve  regurgitation.  5. The aortic valve is tricuspid. Aortic valve regurgitation is not  visualized. Mild aortic valve sclerosis is present, with no evidence of  aortic valve stenosis.  _______________  Chest CTA 07/24/2020: Impressions: 1. Normal thoracic and abdominal aorta. No aneurysm or dissection. 2. No CT findings for pulmonary embolism. 3. Moderate-sized hiatal hernia  with probable reflux. Moderately dilated fluid-filled esophagus. This may account for the patient's chest symptoms. 4. No acute abdominal/pelvic findings, mass lesions or adenopathy. 5. Diffuse colonic diverticulosis but no findings for acute diverticulitis  EKG:  EKG ordered today. EKG personally reviewed and demonstrates normal sinus rhythm, rate 66 bpm, with possible septal infarct, mild T wave inversions in lateral leads. Normal axis. Normal PR and QRS intervals. QTc 425 ms. No acute changes compared to prior tracing.  Recent Labs: 07/22/2020: Magnesium 1.7; TSH 2.574 08/07/2020: ALT 15; B Natriuretic Peptide 147.8; BUN 8; Creatinine, Ser 1.41; Hemoglobin 12.2; Platelets 243; Potassium 3.6; Sodium 140  Recent Lipid Panel    Component Value Date/Time   CHOL 145 05/13/2011 0400   TRIG 65 05/13/2011 0400   HDL 77 05/13/2011 0400   CHOLHDL 1.9 05/13/2011 0400   VLDL 13 05/13/2011 0400   LDLCALC  05/13/2011 0400    55        Total Cholesterol/HDL:CHD Risk Coronary Heart Disease Risk Table                     Men   Women  1/2 Average Risk   3.4   3.3  Average Risk       5.0   4.4  2 X Average Risk   9.6   7.1  3 X Average Risk  23.4   11.0        Use the calculated Patient Ratio above and the CHD Risk Table to determine the patient's CHD Risk.        ATP III CLASSIFICATION (LDL):  <100     mg/dL   Optimal  176-160  mg/dL   Near or Above                    Optimal  130-159  mg/dL   Borderline  737-106  mg/dL   High  >269     mg/dL   Very High    Physical Exam:    Vital Signs: BP (!) 144/82    Pulse 66    Ht 5\' 9"  (1.753 m)    Wt 233 lb 6.4 oz (105.9 kg)    SpO2 95%    BMI 34.47 kg/m     Wt Readings from Last 3 Encounters:  08/11/20 233 lb 6.4 oz (105.9 kg)  07/25/20 223 lb (101.2 kg)  07/13/19 230 lb (104.3 kg)     General: 59 y.o. African-American male in no acute distress. HEENT: Normocephalic and atraumatic. Sclera clear.  Neck: Supple. No carotid bruits. No  JVD. Heart: RRR. Distinct S1 and S2. No murmurs, gallops, or rubs. Radial  pulses 2+ and equal bilaterally. Lungs: No increased work of breathing. Clear to ausculation bilaterally. No wheezes, rhonchi, or rales.  Abdomen: Soft, non-distended, and non-tender to palpation. Bowel sounds present. Extremities: 2+ lower extremity edema bilaterally. Skin: Warm and dry. Neuro: No focal deficits. Psych: Normal affect. Responds appropriately.   Assessment:    1. Paroxysmal atrial flutter (HCC)   2. Gastroesophageal reflux disease with esophagitis, unspecified whether hemorrhage   3. Lower extremity edema   4. Essential hypertension   5. Type 2 diabetes mellitus with obesity (HCC)   6. Stage 3 chronic kidney disease, unspecified whether stage 3a or 3b CKD   7. Cocaine abuse (HCC)   8. Medication management     Plan:    Paroxysmal Atrial Flutter - Maintaining sinus rhythm.  - Continue Cardizem CD 240mg  daily.  - CHA2DS2-VASc = 2 (HTN and DM). Continue Eliquis 5mg  twice daily. Will provide patient assistance forms. - Stressed importance of complete cessation of cocaine. Patient denies any recurrent use.  - Will recheck CBC when patient comes in for repeat labs next week given drop in hemoglobin on labs from 8/26 after starting Eliquis.  Chest Pain Secondary to GERD with Esophagitis - Patient continues to have some heart burn symptoms if he does not take his reflux medications. No  exertional chest pain or anything that sounds like angina.  - EKG shows no acute ischemic changes.  - EGD during recent admission showed severe LA grade D erosive esophagitis in the mid and distal esophagus as well as mild gastritis - Continue Protonix 40mg  twice daily and Carafate 1g twice daily for 2 months followed by Protonix 40mg  once daily as recommended by GI. - Follow-up with GI as directed.   Lower Extremity Edema / Possible Diastolic Dysfunction - Patient notes some lower extremity edema and abdominal  bloating. Weight up 10 lbs from discharge. No dyspnea, orthopnea, or PND. Weight up 10 lbs from discharge. - BNP minimally elevated at 147 on 8/26 in the ED. - LVEF of 65-70% on recent Echo. Diastolic dysfunction could not be evaluated but mild LVH noted. - Lasix 20mg  daily recently prescribed in the ED but he has not started this yet. He is planning on picking this up on Wednesday when he has money. Given 10 lb weight gain and edema, will go ahead and have patient take Lasix 40mg  daily when he picks up the prescriptions. Will also have patient take K-Dur 20 mEq with this.  - Recommended daily weights, limiting salt intake, elevating legs, and compression stockings. Patient to call us if he gains 3 lbs in 1 day or 5 lbs in 1 week. - Will recheck BMET in 1 week after he starts Lasix.  Hypertension - BP mildly elevated at 144/82 today. - Continue Cardizem CD 240 mg daily.  - Will increase Losartan 50mg  daily.  - Will check BMET in 1 week.  Uncontrolled Type 2 Diabetes Mellitus - Hemoglobin A1c 12.8% during recent admission. - Continue Novolin 70/30.  - Managed by PCP. He states he has follow-up with PCP next week.  CKD Stage III - Creatinine 1.40 on discharge and stable at 1.41 on labs last week. This is around patient's baseline. - Will recheck BMET in 1 weeks after starting Lasix and increase in Losartan.  Cocaine Abuse - UDS positive for cocaine during recent admission. - Re-emphasized importance of complete cessation.   Back Pain - Recently seen in the ED for back pain that radiates down legs.  - Still having pain but it has improved some. - X-ray of lumbar spine showed progressive degenerative disc disease within the lower thoracic spine and lower lumbar spine but no acute findings. - Follow-up with PCP.  Disposition: Follow up in 3 months with Dr. Rennis Golden.   Medication Adjustments/Labs and Tests Ordered: Current medicines are reviewed at length with the patient today.  Concerns  regarding medicines are outlined above.  Orders Placed This Encounter  Procedures   Basic metabolic panel   CBC   EKG 12-Lead   Meds ordered this encounter  Medications   losartan (COZAAR) 50 MG tablet    Sig: Take 1 tablet (50 mg total) by mouth daily.    Dispense:  90 tablet    Refill:  3   furosemide (LASIX) 40 MG tablet    Sig: Take 1 tablet (40 mg total) by mouth daily.    Dispense:  90 tablet    Refill:  3    Change in Rx dose   potassium chloride SA (KLOR-CON) 20 MEQ tablet    Sig: Take 1 tablet (20 mEq total) by mouth daily.    Dispense:  90 tablet    Refill:  3    Patient Instructions  Medication Instructions:   INCREASE Losartan to 50 mg daily  INCREASE Lasix to 40  mg daily  START Potassium 20 meq daily  *If you need a refill on your cardiac medications before your next appointment, please call your pharmacy*  Lab Work: Your physician recommends that you return for lab work in 1 week:   BMET  CBC If you have labs (blood work) drawn today and your tests are completely normal, you will receive your results only by:  MyChart Message (if you have MyChart) OR  A paper copy in the mail If you have any lab test that is abnormal or we need to change your treatment, we will call you to review the results.  Testing/Procedures: NONE ordered at this time of appointment   Follow-Up: At [facility], you and your health needs are our priority.  As part of our continuing mission to provide you with exceptional heart care, we have created designated Provider Care Teams.  These Care Teams include your primary Cardiologist (physician) and Advanced Practice Providers (APPs -  Physician Assistants and Nurse Practitioners) who all work together to provide you with the care you need, when you need it.  We recommend signing up for the patient portal called "MyChart".  Sign up information is provided on this After Visit Summary.  MyChart is used to connect with  patients for Virtual Visits (Telemedicine).  Patients are able to view lab/test results, encounter notes, upcoming appointments, etc.  Non-urgent messages can be sent to your provider as well.   To learn more about what you can do with MyChart, go to [email or web address].    Your next appointment:   3 month(s)  The format for your next appointment:   In Person  Provider:   K. [place] Hilty, MD  Other Instructions   Elevate your legs, wear your compression stockings, and limit your daily salt intake to 2000 mg.  Weigh yourself daily. If you gain more than 3 lbs overnight or 5 lbs in a day please give our office a call.     Low-Sodium Eating Plan Sodium, which is an element that makes up salt, helps you maintain a healthy balance of fluids in your body. Too much sodium can increase your blood pressure and cause fluid and waste to be held in your body. Your health care provider or dietitian may recommend following this plan if you have high blood pressure (hypertension), kidney disease, liver disease, or heart failure. Eating less sodium can help lower your blood pressure, reduce swelling, and protect your heart, liver, and kidneys. What are tips for following this plan? General guidelines  Most people on this plan should limit their sodium intake to 1,500-2,000 mg (milligrams) of sodium each day. Reading food labels   The Nutrition Facts label lists the amount of sodium in one serving of the food. If you eat more than one serving, you must multiply the listed amount of sodium by the number of servings.  Choose foods with less than 140 mg of sodium per serving.  Avoid foods with 300 mg of sodium or more per serving. Shopping  Look for lower-sodium products, often labeled as "low-sodium" or "no salt added."  Always check the sodium content even if foods are labeled as "unsalted" or "no salt added".  Buy fresh foods. ? Avoid canned foods and premade or frozen meals. ? Avoid  canned, cured, or processed meats  Buy breads that have less than 80 mg of sodium per slice. Cooking  Eat more home-cooked food and less restaurant, buffet, and fast food.  Avoid adding salt when cooking.  Use salt-free seasonings or herbs instead of table salt or sea salt. Check with your health care provider or pharmacist before using salt substitutes.  Cook with plant-based oils, such as canola, sunflower, or olive oil. Meal planning  When eating at a restaurant, ask that your food be prepared with less salt or no salt, if possible.  Avoid foods that contain MSG (monosodium glutamate). MSG is sometimes added to [place] food, bouillon, and some canned foods. What foods are recommended? The items listed may not be a complete list. Talk with your dietitian about what dietary choices are best for you. Grains Low-sodium cereals, including oats, puffed wheat and rice, and shredded wheat. Low-sodium crackers. Unsalted rice. Unsalted pasta. Low-sodium bread. Whole-grain breads and whole-grain pasta. Vegetables Fresh or frozen vegetables. "No salt added" canned vegetables. "No salt added" tomato sauce and paste. Low-sodium or reduced-sodium tomato and vegetable juice. Fruits Fresh, frozen, or canned fruit. Fruit juice. Meats and other protein foods Fresh or frozen (no salt added) meat, poultry, seafood, and fish. Low-sodium canned tuna and salmon. Unsalted nuts. Dried peas, beans, and lentils without added salt. Unsalted canned beans. Eggs. Unsalted nut butters. Dairy Milk. Soy milk. Cheese that is naturally low in sodium, such as ricotta cheese, fresh mozzarella, or Swiss cheese Low-sodium or reduced-sodium cheese. Cream cheese. Yogurt. Fats and oils Unsalted butter. Unsalted margarine with no trans fat. Vegetable oils such as canola or olive oils. Seasonings and other foods Fresh and dried herbs and spices. Salt-free seasonings. Low-sodium mustard and ketchup. Sodium-free salad dressing.  Sodium-free light mayonnaise. Fresh or refrigerated horseradish. Lemon juice. Vinegar. Homemade, reduced-sodium, or low-sodium soups. Unsalted popcorn and pretzels. Low-salt or salt-free chips. What foods are not recommended? The items listed may not be a complete list. Talk with your dietitian about what dietary choices are best for you. Grains Instant hot cereals. Bread stuffing, pancake, and biscuit mixes. Croutons. Seasoned rice or pasta mixes. Noodle soup cups. Boxed or frozen macaroni and cheese. Regular salted crackers. Self-rising flour. Vegetables Sauerkraut, pickled vegetables, and relishes. Olives. [place] fries. Onion rings. Regular canned vegetables (not low-sodium or reduced-sodium). Regular canned tomato sauce and paste (not low-sodium or reduced-sodium). Regular tomato and vegetable juice (not low-sodium or reduced-sodium). Frozen vegetables in sauces. Meats and other protein foods Meat or fish that is salted, canned, smoked, spiced, or pickled. Bacon, ham, sausage, hotdogs, corned beef, chipped beef, packaged lunch meats, salt pork, jerky, pickled herring, anchovies, regular canned tuna, sardines, salted nuts. Dairy Processed cheese and cheese spreads. Cheese curds. Blue cheese. Feta cheese. String cheese. Regular cottage cheese. Buttermilk. Canned milk. Fats and oils Salted butter. Regular margarine. Ghee. Bacon fat. Seasonings and other foods Onion salt, garlic salt, seasoned salt, table salt, and sea salt. Canned and packaged gravies. Worcestershire sauce. Tartar sauce. Barbecue sauce. Teriyaki sauce. Soy sauce, including reduced-sodium. Steak sauce. Fish sauce. Oyster sauce. Cocktail sauce. Horseradish that you find on the shelf. Regular ketchup and mustard. Meat flavorings and tenderizers. Bouillon cubes. Hot sauce and Tabasco sauce. Premade or packaged marinades. Premade or packaged taco seasonings. Relishes. Regular salad dressings. Salsa. Potato and tortilla chips. Corn chips  and puffs. Salted popcorn and pretzels. Canned or dried soups. Pizza. Frozen entrees and pot pies. Summary  Eating less sodium can help lower your blood pressure, reduce swelling, and protect your heart, liver, and kidneys.  Most people on this plan should limit their sodium intake to 1,500-2,000 mg (milligrams) of sodium each day.  Canned, boxed, and frozen foods are high in sodium.  Restaurant foods, fast foods, and pizza are also very high in sodium. You also get sodium by adding salt to food.  Try to cook at home, eat more fresh fruits and vegetables, and eat less fast food, canned, processed, or prepared foods. This information is not intended to replace advice given to you by your health care provider. Make sure you discuss any questions you have with your health care provider. Document Revised: 11/11/2017 Document Reviewed: 11/22/2016 Elsevier Patient Education  [address]       Signed, Corrin Parker, [place]  08/11/2020 2:33 PM    [facility]

## 2020-08-07 ENCOUNTER — Emergency Department (HOSPITAL_COMMUNITY): Payer: Self-pay

## 2020-08-07 ENCOUNTER — Encounter (HOSPITAL_COMMUNITY): Payer: Self-pay | Admitting: *Deleted

## 2020-08-07 ENCOUNTER — Telehealth: Payer: Self-pay | Admitting: Student

## 2020-08-07 ENCOUNTER — Other Ambulatory Visit: Payer: Self-pay

## 2020-08-07 ENCOUNTER — Emergency Department (HOSPITAL_COMMUNITY)
Admission: EM | Admit: 2020-08-07 | Discharge: 2020-08-07 | Disposition: A | Discharge status: Home / Self Care | Payer: Self-pay | Attending: Emergency Medicine | Admitting: Emergency Medicine

## 2020-08-07 DIAGNOSIS — I129 Hypertensive chronic kidney disease with stage 1 through stage 4 chronic kidney disease, or unspecified chronic kidney disease: Secondary | ICD-10-CM | POA: Insufficient documentation

## 2020-08-07 DIAGNOSIS — M545 Low back pain, unspecified: Secondary | ICD-10-CM

## 2020-08-07 DIAGNOSIS — Z79899 Other long term (current) drug therapy: Secondary | ICD-10-CM | POA: Insufficient documentation

## 2020-08-07 DIAGNOSIS — R2243 Localized swelling, mass and lump, lower limb, bilateral: Secondary | ICD-10-CM | POA: Insufficient documentation

## 2020-08-07 DIAGNOSIS — R140 Abdominal distension (gaseous): Secondary | ICD-10-CM | POA: Insufficient documentation

## 2020-08-07 DIAGNOSIS — Z794 Long term (current) use of insulin: Secondary | ICD-10-CM | POA: Insufficient documentation

## 2020-08-07 DIAGNOSIS — E119 Type 2 diabetes mellitus without complications: Secondary | ICD-10-CM | POA: Insufficient documentation

## 2020-08-07 DIAGNOSIS — F1590 Other stimulant use, unspecified, uncomplicated: Secondary | ICD-10-CM | POA: Insufficient documentation

## 2020-08-07 DIAGNOSIS — N183 Chronic kidney disease, stage 3 unspecified: Secondary | ICD-10-CM | POA: Insufficient documentation

## 2020-08-07 DIAGNOSIS — R0602 Shortness of breath: Secondary | ICD-10-CM | POA: Insufficient documentation

## 2020-08-07 LAB — CBC WITH DIFFERENTIAL/PLATELET
Abs Immature Granulocytes: 0.01 10*3/uL (ref 0.00–0.07)
Basophils Absolute: 0 10*3/uL (ref 0.0–0.1)
Basophils Relative: 1 %
Eosinophils Absolute: 0.1 10*3/uL (ref 0.0–0.5)
Eosinophils Relative: 3 %
HCT: 38.9 % — ABNORMAL LOW (ref 39.0–52.0)
Hemoglobin: 12.2 g/dL — ABNORMAL LOW (ref 13.0–17.0)
Immature Granulocytes: 0 %
Lymphocytes Relative: 53 %
Lymphs Abs: 2.8 10*3/uL (ref 0.7–4.0)
MCH: 26.4 pg (ref 26.0–34.0)
MCHC: 31.4 g/dL (ref 30.0–36.0)
MCV: 84.2 fL (ref 80.0–100.0)
Monocytes Absolute: 0.6 10*3/uL (ref 0.1–1.0)
Monocytes Relative: 11 %
Neutro Abs: 1.7 10*3/uL (ref 1.7–7.7)
Neutrophils Relative %: 32 %
Platelets: 243 10*3/uL (ref 150–400)
RBC: 4.62 MIL/uL (ref 4.22–5.81)
RDW: 16 % — ABNORMAL HIGH (ref 11.5–15.5)
WBC: 5.2 10*3/uL (ref 4.0–10.5)
nRBC: 0 % (ref 0.0–0.2)

## 2020-08-07 LAB — COMPREHENSIVE METABOLIC PANEL
ALT: 15 U/L (ref 0–44)
AST: 22 U/L (ref 15–41)
Albumin: 3.2 g/dL — ABNORMAL LOW (ref 3.5–5.0)
Alkaline Phosphatase: 80 U/L (ref 38–126)
Anion gap: 8 (ref 5–15)
BUN: 8 mg/dL (ref 6–20)
CO2: 24 mmol/L (ref 22–32)
Calcium: 8.5 mg/dL — ABNORMAL LOW (ref 8.9–10.3)
Chloride: 108 mmol/L (ref 98–111)
Creatinine, Ser: 1.41 mg/dL — ABNORMAL HIGH (ref 0.61–1.24)
GFR calc Af Amer: >60 mL/min (ref >60)
GFR calc non Af Amer: 55 mL/min — ABNORMAL LOW (ref >60)
Glucose, Bld: 145 mg/dL — ABNORMAL HIGH (ref 70–99)
Potassium: 3.6 mmol/L (ref 3.5–5.1)
Sodium: 140 mmol/L (ref 135–145)
Total Bilirubin: 0.4 mg/dL (ref 0.3–1.2)
Total Protein: 6.5 g/dL (ref 6.5–8.1)

## 2020-08-07 MED ORDER — OXYCODONE-ACETAMINOPHEN 5-325 MG PO TABS: 1.0000 | ORAL_TABLET | Freq: Four times a day (QID) | ORAL | 0 refills | Status: DC | PRN

## 2020-08-07 MED ORDER — HYDROMORPHONE HCL 1 MG/ML IJ SOLN
1.0000 mg | Freq: Once | INTRAMUSCULAR | Status: AC
  Administered 2020-08-07: 1 mg via INTRAMUSCULAR
  Filled 2020-08-07: qty 1

## 2020-08-07 MED ORDER — FUROSEMIDE 20 MG PO TABS
20.0000 mg | ORAL_TABLET | Freq: Every day | ORAL | 0 refills | Status: DC
Start: 2020-08-07 — End: 2020-08-11

## 2020-08-07 NOTE — ED Provider Notes (Signed)
[facility] EMERGENCY DEPARTMENT Provider Note   CSN: [id number] Arrival date & time: 08/07/20  1647     History Chief Complaint  Patient presents with  . Back Pain    Leilan Bochenek is a 59 y.o. male.  HPI 59 year old male with a history of a flutter, DM type II, cocaine and marijuana abuse, GERD, hypertension, CKD presents to the ER with complaints of predominantly left-sided but also slightly right-sided low back pain radiating down into his knees.  He also complains of bilateral leg swelling.  Patient states that he was started on a medication which she cannot remember (per chart review, this appears to be Eliquis) after being brought into the hospital for A.  Flutter with RVR, and since then has been having low back pain which has been preventing him from working.  He denies any numbness to the groin, inability to hold bowel and bladder, foot drop.  Denies any fevers or chills.  Denies any history of IV drug use.  Per chart review, patient called his cardiologist today and was told to come to the ER for evaluation.  Last echo done on August 13 of this year showed an EF of 60 to 65%.  No known history of heart failure.  He denies any chest pain or shortness of breath, however note from cardiologist did note that he had some mild shortness of breath though this has been unchanged from his hospital stay.  He also notes some abdominal distention, however no pain.  No nausea or vomiting.    Past Medical History:  Diagnosis Date  . Achilles tendon rupture right  . Arthritis    back and shoulders  . Atrial flutter with rapid ventricular response (HCC) 07/22/2020  . Chest pain    due to cocaine use  . Diabetes mellitus without complication (HCC)   . Drug abuse (HCC)    cocaine, marijuana abuse  . GERD (gastroesophageal reflux disease)   . Hypertension   . Renal insufficiency     Patient Active Problem List   Diagnosis Date Noted  . Esophagitis   .  Midepigastric pain   . Atrial flutter with rapid ventricular response (HCC) 07/22/2020  . Cocaine abuse (HCC)   . COVID-19 virus infection 05/31/2019  . Orthostatic syncope 05/31/2019  . Diabetes mellitus type 2, insulin dependent (HCC) 05/31/2019  . Drug abuse (HCC)   . CKD (chronic kidney disease), stage III   . Right ankle pain 05/15/2014  . Uncontrolled hypertension 05/14/2014  . Diabetes mellitus (HCC) 05/14/2014  . Rupture of right Achilles tendon 05/02/2014    Past Surgical History:  Procedure Laterality Date  . ACHILLES TENDON SURGERY Right 05/02/2014   Procedure: RIGHT ACHILLES TENDON REPAIR;  Surgeon: Javier Docker, MD;  Location: [facility];  Service: Orthopedics;  Laterality: Right;  . BIOPSY  07/25/2020   Procedure: BIOPSY;  Surgeon: Kathi Der, MD;  Location: [facility];  Service: Gastroenterology;;  . CARDIAC CATHETERIZATION  05.31.2012   showing patent coronaries with no visualized vasospasm and EF of 55-60 %  . ESOPHAGOGASTRODUODENOSCOPY N/A 07/25/2020   Procedure: ESOPHAGOGASTRODUODENOSCOPY (EGD);  Surgeon: Kathi Der, MD;  Location: [facility] ENDOSCOPY;  Service: Gastroenterology;  Laterality: N/A;  . ORBITAL FRACTURE SURGERY Left 2-3 yrs ago       Family History  Problem Relation Age of Onset  . Unexplained death Mother 77       She died in 03-Apr-2016, he does not remember what she died of  . Unexplained  death Father        He has no information on his father    Social History   Tobacco Use  . Smoking status: Never Smoker  . Smokeless tobacco: Never Used  Vaping Use  . Vaping Use: Never used  Substance Use Topics  . Alcohol use: Yes    Alcohol/week: 3.0 standard drinks    Types: 3 Cans of beer per week    Comment: occassional  . Drug use: Yes    Types: Cocaine, Marijuana    Comment: last cocaine use 2 to 3 yrs ago, last marijuana use 4 to 5 years ago    Home Medications Prior to Admission medications   Medication Sig Start Date End Date  Taking? Authorizing Provider  apixaban (ELIQUIS) 5 MG TABS tablet Take 1 tablet (5 mg total) by mouth 2 (two) times daily. 07/25/20   Corrin ParkerGoodrich, Callie E, PA-C  diltiazem (CARDIZEM CD) 240 MG 24 hr capsule Take 1 capsule (240 mg total) by mouth daily. 07/25/20   Marjie SkiffGoodrich, Callie E, PA-C  insulin NPH-regular Human (NOVOLIN 70/30) (70-30) 100 UNIT/ML injection Inject 35 units into the skin 2 times daily with a meal. 07/25/20   Marjie SkiffGoodrich, Callie E, PA-C  losartan (COZAAR) 25 MG tablet Take 1 tablet (25 mg total) by mouth daily. 07/25/20   Marjie SkiffGoodrich, Callie E, PA-C  pantoprazole (PROTONIX) 40 MG tablet Take 1 tablet (40 mg total) by mouth 2 (two) times daily. 07/25/20   Marjie SkiffGoodrich, Callie E, PA-C  SEROQUEL 50 MG tablet Take 50 mg by mouth at bedtime. 03/03/20   [provider]  sucralfate (CARAFATE) 1 g tablet Take 1 tablet (1 g total) by mouth 2 (two) times daily. 07/25/20 10/23/20  Corrin ParkerGoodrich, Callie E, PA-C  ZYRTEC ALLERGY 10 MG tablet Take 10 mg by mouth at bedtime. 03/03/20   [provider]    Allergies    Patient has no known allergies.  Review of Systems   Review of Systems  Constitutional: Negative for chills and fever.  HENT: Negative for ear pain and sore throat.   Eyes: Negative for pain and visual disturbance.  Respiratory: Negative for cough and shortness of breath.   Cardiovascular: Positive for leg swelling. Negative for chest pain and palpitations.  Gastrointestinal: Negative for abdominal pain and vomiting.  Genitourinary: Negative for dysuria and hematuria.  Musculoskeletal: Positive for back pain. Negative for arthralgias.  Skin: Negative for color change and rash.  Neurological: Negative for seizures and syncope.  All other systems reviewed and are negative.   Physical Exam Updated Vital Signs BP (!) 163/98 (BP Location: Left Arm)   Pulse 84   Temp 98.4 F (36.9 C) (Oral)   Resp 16   SpO2 99%   Physical Exam Vitals reviewed.  Constitutional:      General:  He is not in acute distress.    Appearance: Normal appearance. He is ill-appearing (Chronically ill-appearing). He is not toxic-appearing or diaphoretic.  HENT:     Head: Normocephalic and atraumatic.     Mouth/Throat:     Mouth: Mucous membranes are moist.     Pharynx: Oropharynx is clear.  Eyes:     General:        Right eye: No discharge.        Left eye: No discharge.     Extraocular Movements: Extraocular movements intact.     Conjunctiva/sclera: Conjunctivae normal.  Cardiovascular:     Rate and Rhythm: Normal rate and regular rhythm.  Pulses: Normal pulses.     Heart sounds: Normal heart sounds.  Pulmonary:     Effort: Pulmonary effort is normal.     Breath sounds: Normal breath sounds.  Abdominal:     General: Abdomen is flat. There is distension.     Tenderness: There is no abdominal tenderness.  Musculoskeletal:        General: Tenderness present. No swelling, deformity or signs of injury. Normal range of motion.     Cervical back: Normal range of motion.     Right lower leg: Edema present.     Left lower leg: Edema present.     Comments: 2+ pitting edema in lower extremities bilaterally.  Midline tenderness to the L-spine, reproducible paraspinal muscle tenderness bilaterally to the L-spine, left more than right.  Moving all 4 extremities without difficulty.  5/5 strength, gross sensations intact.  Skin:    General: Skin is warm and dry.     Findings: No erythema or rash.  Neurological:     General: No focal deficit present.     Mental Status: He is alert and oriented to person, place, and time.     Sensory: No sensory deficit.     Motor: No weakness.  Psychiatric:        Mood and Affect: Mood normal.        Behavior: Behavior normal.     ED Results / Procedures / Treatments   Labs (all labs ordered are listed, but only abnormal results are displayed) Labs Reviewed  CBC WITH DIFFERENTIAL/PLATELET  COMPREHENSIVE METABOLIC PANEL  BRAIN NATRIURETIC PEPTIDE    URINALYSIS, ROUTINE W REFLEX MICROSCOPIC    EKG None  Radiology No results found.  Procedures Procedures (including critical care time)  Medications Ordered in ED Medications - No data to display  ED Course  I have reviewed the triage vital signs and the nursing notes.  Pertinent labs & imaging results that were available during my care of the patient were reviewed by me and considered in my medical decision making (see chart for details).    MDM Rules/Calculators/A&P                         59 year old male complaining of low back pain, leg swelling, also noted to have abdominal distention On presentation, he is a chronically appearing, however nontoxic-appearing, in no acute distress, resting comfortably in the ER bed.  Vitals overall reassuring, he is not tachycardic or febrile.  Physical exam with midline L-spine tenderness and paraspinal muscle tenderness bilaterally,L>R.  Neurovascularly intact.  Moving all 4 extremities.  Also noted to have 2+ pitting edema in lower extremities bilaterally.  Lung sounds clear.  Abdomen significantly distended but not tender.  States that his back pain started when he began taking Eliquis several weeks ago.   DDx includes new onset heart failure, liver cirrhosis/ascites given abdominal distention, dissection, musculoskeletal strain, kidney stones   Per chart review, patient had been hospitalized and discharged on 8/13, had a CTA done which did not show any evidence of aneurysm.  He was noted to have a moderate sized hiatal hernia and a dilated fluid-filled esophagus for which an EGD was performed during his hospital stay.No other acute abdominal findings at that time.  We will start with basic labs, BNP, chest x-ray, plain films of his L-spine, UA to check for blood to suggest kidney stones.  Given recent abdominal imaging, I do not think he needs an abdominal CT at  this time.   Signed patient out to Dr. Estell Harpin who will oversee his lab work,  and dispo accordingly.  Final Clinical Impression(s) / ED Diagnoses Final diagnoses:  None    Rx / DC Orders ED Discharge Orders    None       Leone Brand 08/07/20 2142    Bethann Berkshire, MD 08/11/20 1049

## 2020-08-07 NOTE — Telephone Encounter (Signed)
Returned call to patient-patient states since he was started on the new medication at the hospital he has been experiencing severe back pain.   He states this is preventing him from working and this did not start until the medication was started.    He is unsure what medication this is, he is at work. Attempted to review cardiac medications, patient is unsure what medications are and states he is taking whatever is on the list.   He also reports some BL LE swelling, states some SOB but has not increased since discharge.  He does not have a scale to weight, or a way to check BP/HR.    He states he cannot handle the back pain and requesting to speak to someone about this.   Advised unsure this is related to medication and if he is this much pain I recommended PCP evaluation or urgent care evaluation.   He states he does not have a PCP and cannot afford this and continues to request to speak to someone about it.   Again, advised I do not think this is medication related and we cannot acutely evaluate him or his back pain here in our office.   Patient has upcoming appt 8/30 with Marjie Skiff PA.  Advised to keep this appointment and would route to MD to make aware/further recommendations.   Patient ended up hanging up the phone, call ended.

## 2020-08-07 NOTE — Telephone Encounter (Signed)
Patient currently in ED.

## 2020-08-07 NOTE — Discharge Instructions (Addendum)
Follow up with your md as planned.  Keep your legs elevated

## 2020-08-07 NOTE — Telephone Encounter (Signed)
I agree. I do not think this is due to any of his medications. He tolerated those medications fine while in the hospital. He has pretty severe esophagitis so don't know if it could be from that. I also agree that if he is having that much pain, he needs to be evaluated in an Urgent Care or ED. I don't want to stop any of his medications without knowing what his heart rate and BP are first.  Thanks so much!

## 2020-08-07 NOTE — ED Triage Notes (Signed)
Pt is here for lower back pain with pain radiating down legs.  Not associated with any injury.

## 2020-08-07 NOTE — Telephone Encounter (Signed)
Pt c/o medication issue:  1. Name of Medication:  Pt is working, does not have the name of the medicine  2. How are you currently taking this medication (dosage and times per day)?   3. Are you having a reaction (difficulty breathing--STAT)?   4. What is your medication issue?  Pt says his back is hurting so bad, can not stand it and legs are swelling a lot- He says he just can not make it

## 2020-08-08 LAB — BRAIN NATRIURETIC PEPTIDE: B Natriuretic Peptide: 147.8 pg/mL — ABNORMAL HIGH (ref 0.0–100.0)

## 2020-08-11 ENCOUNTER — Encounter: Payer: Self-pay | Admitting: Student

## 2020-08-11 ENCOUNTER — Other Ambulatory Visit: Payer: Self-pay

## 2020-08-11 ENCOUNTER — Ambulatory Visit (INDEPENDENT_AMBULATORY_CARE_PROVIDER_SITE_OTHER): Payer: Self-pay | Admitting: Student

## 2020-08-11 VITALS — BP 144/82 | HR 66 | Ht 69.0 in | Wt 233.4 lb

## 2020-08-11 DIAGNOSIS — E1169 Type 2 diabetes mellitus with other specified complication: Secondary | ICD-10-CM

## 2020-08-11 DIAGNOSIS — M545 Low back pain, unspecified: Secondary | ICD-10-CM

## 2020-08-11 DIAGNOSIS — I4892 Unspecified atrial flutter: Secondary | ICD-10-CM

## 2020-08-11 DIAGNOSIS — E669 Obesity, unspecified: Secondary | ICD-10-CM

## 2020-08-11 DIAGNOSIS — R600 Localized edema: Secondary | ICD-10-CM

## 2020-08-11 DIAGNOSIS — Z79899 Other long term (current) drug therapy: Secondary | ICD-10-CM

## 2020-08-11 DIAGNOSIS — F1410 Cocaine abuse, uncomplicated: Secondary | ICD-10-CM

## 2020-08-11 DIAGNOSIS — N183 Chronic kidney disease, stage 3 unspecified: Secondary | ICD-10-CM

## 2020-08-11 DIAGNOSIS — I10 Essential (primary) hypertension: Secondary | ICD-10-CM

## 2020-08-11 DIAGNOSIS — K2100 Gastro-esophageal reflux disease with esophagitis, without bleeding: Secondary | ICD-10-CM

## 2020-08-11 MED ORDER — FUROSEMIDE 40 MG PO TABS
40.0000 mg | ORAL_TABLET | Freq: Every day | ORAL | 3 refills | Status: DC
Start: 2020-08-11 — End: 2021-10-28

## 2020-08-11 MED ORDER — LOSARTAN POTASSIUM 50 MG PO TABS: 50.0000 mg | ORAL_TABLET | Freq: Every day | ORAL | 3 refills | Status: DC

## 2020-08-11 MED ORDER — POTASSIUM CHLORIDE CRYS ER 20 MEQ PO TBCR: 20.0000 meq | EXTENDED_RELEASE_TABLET | Freq: Every day | ORAL | 3 refills | Status: DC

## 2020-08-11 NOTE — Patient Instructions (Addendum)
Medication Instructions:   INCREASE Losartan to 50 mg daily  INCREASE Lasix to 40 mg daily  START Potassium 20 meq daily  *If you need a refill on your cardiac medications before your next appointment, please call your pharmacy*  Lab Work: Your physician recommends that you return for lab work in 1 week:   BMET  CBC If you have labs (blood work) drawn today and your tests are completely normal, you will receive your results only by: Marland Kitchen MyChart Message (if you have MyChart) OR . A paper copy in the mail If you have any lab test that is abnormal or we need to change your treatment, we will call you to review the results.  Testing/Procedures: NONE ordered at this time of appointment   Follow-Up: At [facility], you and your health needs are our priority.  As part of our continuing mission to provide you with exceptional heart care, we have created designated Provider Care Teams.  These Care Teams include your primary Cardiologist (physician) and Advanced Practice Providers (APPs -  Physician Assistants and Nurse Practitioners) who all work together to provide you with the care you need, when you need it.  We recommend signing up for the patient portal called "MyChart".  Sign up information is provided on this After Visit Summary.  MyChart is used to connect with patients for Virtual Visits (Telemedicine).  Patients are able to view lab/test results, encounter notes, upcoming appointments, etc.  Non-urgent messages can be sent to your provider as well.   To learn more about what you can do with MyChart, go to [email or web address].    Your next appointment:   3 month(s)  The format for your next appointment:   In Person  Provider:   K. Italy Hilty, MD  Other Instructions   Elevate your legs, wear your compression stockings, and limit your daily salt intake to 2000 mg.  Weigh yourself daily. If you gain more than 3 lbs overnight or 5 lbs in a day please give our office a  call.     Low-Sodium Eating Plan Sodium, which is an element that makes up salt, helps you maintain a healthy balance of fluids in your body. Too much sodium can increase your blood pressure and cause fluid and waste to be held in your body. Your health care provider or dietitian may recommend following this plan if you have high blood pressure (hypertension), kidney disease, liver disease, or heart failure. Eating less sodium can help lower your blood pressure, reduce swelling, and protect your heart, liver, and kidneys. What are tips for following this plan? General guidelines  Most people on this plan should limit their sodium intake to 1,500-2,000 mg (milligrams) of sodium each day. Reading food labels   The Nutrition Facts label lists the amount of sodium in one serving of the food. If you eat more than one serving, you must multiply the listed amount of sodium by the number of servings.  Choose foods with less than 140 mg of sodium per serving.  Avoid foods with 300 mg of sodium or more per serving. Shopping  Look for lower-sodium products, often labeled as "low-sodium" or "no salt added."  Always check the sodium content even if foods are labeled as "unsalted" or "no salt added".  Buy fresh foods. ? Avoid canned foods and premade or frozen meals. ? Avoid canned, cured, or processed meats  Buy breads that have less than 80 mg of sodium per slice. Cooking  Eat more home-cooked  food and less restaurant, buffet, and fast food.  Avoid adding salt when cooking. Use salt-free seasonings or herbs instead of table salt or sea salt. Check with your health care provider or pharmacist before using salt substitutes.  Cook with plant-based oils, such as canola, sunflower, or olive oil. Meal planning  When eating at a restaurant, ask that your food be prepared with less salt or no salt, if possible.  Avoid foods that contain MSG (monosodium glutamate). MSG is sometimes added to  [place] food, bouillon, and some canned foods. What foods are recommended? The items listed may not be a complete list. Talk with your dietitian about what dietary choices are best for you. Grains Low-sodium cereals, including oats, puffed wheat and rice, and shredded wheat. Low-sodium crackers. Unsalted rice. Unsalted pasta. Low-sodium bread. Whole-grain breads and whole-grain pasta. Vegetables Fresh or frozen vegetables. "No salt added" canned vegetables. "No salt added" tomato sauce and paste. Low-sodium or reduced-sodium tomato and vegetable juice. Fruits Fresh, frozen, or canned fruit. Fruit juice. Meats and other protein foods Fresh or frozen (no salt added) meat, poultry, seafood, and fish. Low-sodium canned tuna and salmon. Unsalted nuts. Dried peas, beans, and lentils without added salt. Unsalted canned beans. Eggs. Unsalted nut butters. Dairy Milk. Soy milk. Cheese that is naturally low in sodium, such as ricotta cheese, fresh mozzarella, or Swiss cheese Low-sodium or reduced-sodium cheese. Cream cheese. Yogurt. Fats and oils Unsalted butter. Unsalted margarine with no trans fat. Vegetable oils such as canola or olive oils. Seasonings and other foods Fresh and dried herbs and spices. Salt-free seasonings. Low-sodium mustard and ketchup. Sodium-free salad dressing. Sodium-free light mayonnaise. Fresh or refrigerated horseradish. Lemon juice. Vinegar. Homemade, reduced-sodium, or low-sodium soups. Unsalted popcorn and pretzels. Low-salt or salt-free chips. What foods are not recommended? The items listed may not be a complete list. Talk with your dietitian about what dietary choices are best for you. Grains Instant hot cereals. Bread stuffing, pancake, and biscuit mixes. Croutons. Seasoned rice or pasta mixes. Noodle soup cups. Boxed or frozen macaroni and cheese. Regular salted crackers. Self-rising flour. Vegetables Sauerkraut, pickled vegetables, and relishes. Olives. [place] fries.  Onion rings. Regular canned vegetables (not low-sodium or reduced-sodium). Regular canned tomato sauce and paste (not low-sodium or reduced-sodium). Regular tomato and vegetable juice (not low-sodium or reduced-sodium). Frozen vegetables in sauces. Meats and other protein foods Meat or fish that is salted, canned, smoked, spiced, or pickled. Bacon, ham, sausage, hotdogs, corned beef, chipped beef, packaged lunch meats, salt pork, jerky, pickled herring, anchovies, regular canned tuna, sardines, salted nuts. Dairy Processed cheese and cheese spreads. Cheese curds. Blue cheese. Feta cheese. String cheese. Regular cottage cheese. Buttermilk. Canned milk. Fats and oils Salted butter. Regular margarine. Ghee. Bacon fat. Seasonings and other foods Onion salt, garlic salt, seasoned salt, table salt, and sea salt. Canned and packaged gravies. Worcestershire sauce. Tartar sauce. Barbecue sauce. Teriyaki sauce. Soy sauce, including reduced-sodium. Steak sauce. Fish sauce. Oyster sauce. Cocktail sauce. Horseradish that you find on the shelf. Regular ketchup and mustard. Meat flavorings and tenderizers. Bouillon cubes. Hot sauce and Tabasco sauce. Premade or packaged marinades. Premade or packaged taco seasonings. Relishes. Regular salad dressings. Salsa. Potato and tortilla chips. Corn chips and puffs. Salted popcorn and pretzels. Canned or dried soups. Pizza. Frozen entrees and pot pies. Summary  Eating less sodium can help lower your blood pressure, reduce swelling, and protect your heart, liver, and kidneys.  Most people on this plan should limit their sodium intake to 1,500-2,000 mg (milligrams)  of sodium each day.  Canned, boxed, and frozen foods are high in sodium. Restaurant foods, fast foods, and pizza are also very high in sodium. You also get sodium by adding salt to food.  Try to cook at home, eat more fresh fruits and vegetables, and eat less fast food, canned, processed, or prepared foods. This  information is not intended to replace advice given to you by your health care provider. Make sure you discuss any questions you have with your health care provider. Document Revised: 11/11/2017 Document Reviewed: 11/22/2016 Elsevier Patient Education  2020 [employer].

## 2020-09-26 ENCOUNTER — Encounter (HOSPITAL_COMMUNITY): Payer: Self-pay | Admitting: Emergency Medicine

## 2020-09-26 ENCOUNTER — Emergency Department (HOSPITAL_COMMUNITY): Payer: Self-pay

## 2020-09-26 ENCOUNTER — Emergency Department (HOSPITAL_COMMUNITY)
Admission: EM | Admit: 2020-09-26 | Discharge: 2020-09-26 | Disposition: A | Discharge status: Home / Self Care | Payer: Self-pay | Attending: Emergency Medicine | Admitting: Emergency Medicine

## 2020-09-26 DIAGNOSIS — Z79899 Other long term (current) drug therapy: Secondary | ICD-10-CM | POA: Insufficient documentation

## 2020-09-26 DIAGNOSIS — M79671 Pain in right foot: Secondary | ICD-10-CM

## 2020-09-26 DIAGNOSIS — S92534A Nondisplaced fracture of distal phalanx of right lesser toe(s), initial encounter for closed fracture: Secondary | ICD-10-CM | POA: Insufficient documentation

## 2020-09-26 DIAGNOSIS — Z7901 Long term (current) use of anticoagulants: Secondary | ICD-10-CM | POA: Insufficient documentation

## 2020-09-26 DIAGNOSIS — I129 Hypertensive chronic kidney disease with stage 1 through stage 4 chronic kidney disease, or unspecified chronic kidney disease: Secondary | ICD-10-CM | POA: Insufficient documentation

## 2020-09-26 DIAGNOSIS — N183 Chronic kidney disease, stage 3 unspecified: Secondary | ICD-10-CM | POA: Insufficient documentation

## 2020-09-26 DIAGNOSIS — Z8616 Personal history of COVID-19: Secondary | ICD-10-CM | POA: Insufficient documentation

## 2020-09-26 DIAGNOSIS — S92514A Nondisplaced fracture of proximal phalanx of right lesser toe(s), initial encounter for closed fracture: Secondary | ICD-10-CM

## 2020-09-26 DIAGNOSIS — Z794 Long term (current) use of insulin: Secondary | ICD-10-CM | POA: Insufficient documentation

## 2020-09-26 DIAGNOSIS — E1122 Type 2 diabetes mellitus with diabetic chronic kidney disease: Secondary | ICD-10-CM | POA: Insufficient documentation

## 2020-09-26 DIAGNOSIS — Z23 Encounter for immunization: Secondary | ICD-10-CM | POA: Insufficient documentation

## 2020-09-26 DIAGNOSIS — W010XXA Fall on same level from slipping, tripping and stumbling without subsequent striking against object, initial encounter: Secondary | ICD-10-CM | POA: Insufficient documentation

## 2020-09-26 MED ORDER — OXYCODONE-ACETAMINOPHEN 5-325 MG PO TABS
2.0000 | ORAL_TABLET | Freq: Once | ORAL | Status: AC
  Administered 2020-09-26: 2 via ORAL
  Filled 2020-09-26: qty 2

## 2020-09-26 MED ORDER — CEPHALEXIN 500 MG PO CAPS: 500.0000 mg | ORAL_CAPSULE | Freq: Two times a day (BID) | ORAL | 0 refills | Status: AC

## 2020-09-26 MED ORDER — CEPHALEXIN 250 MG PO CAPS
500.0000 mg | ORAL_CAPSULE | Freq: Once | ORAL | Status: AC
  Administered 2020-09-26: 500 mg via ORAL
  Filled 2020-09-26: qty 2

## 2020-09-26 MED ORDER — TETANUS-DIPHTH-ACELL PERTUSSIS 5-2.5-18.5 LF-MCG/0.5 IM SUSP
0.5000 mL | Freq: Once | INTRAMUSCULAR | Status: AC
  Administered 2020-09-26: 0.5 mL via INTRAMUSCULAR
  Filled 2020-09-26: qty 0.5

## 2020-09-26 MED ORDER — OXYCODONE-ACETAMINOPHEN 5-325 MG PO TABS
1.0000 | ORAL_TABLET | ORAL | 0 refills | Status: AC | PRN
Start: 2020-09-26 — End: 2020-10-01

## 2020-09-26 NOTE — Discharge Instructions (Signed)
Ryan [name], it is a pleasure getting to know you today.  You came to the ED for right foot pain due to trauma.  X-ray of your foot shows a tiny fracture at the second toe.  I buddy taped the second toe with the great toe for stability.  You can change the dressing and the tape as needed.  You can take Percocet for pain as needed and Keflex for antibiotic for 5 days.  You can pick up these medication at the [facility] on [address].  Because you have diabetes, I sent out a referral to podiatry to follow-up with your foot wound.  Please come back to the ED for worsening pain or worsening symptoms.  Take care

## 2020-09-26 NOTE — ED Triage Notes (Signed)
Pt reports pain to R foot after dropping a grease pot on it at work.

## 2020-09-26 NOTE — ED Provider Notes (Signed)
[facility] EMERGENCY DEPARTMENT Provider Note   CSN: [id number] Arrival date & time: 09/26/20  04/21/05     History Chief Complaint  Patient presents with  . Foot Pain    Ryan [name] is a 59 y.o. male with history of diabetes who presents to the ED for right foot pain after dropping a grease pot on it at work.  He has some superficial abrasion on the right second toe.  Patient reports pain at all of his toes.  Patient is diabetic and is on insulin.  He reports normal sensation of his feet.  He has not seen a podiatrist in the past.  HPI     Past Medical History:  Diagnosis Date  . Achilles tendon rupture right  . Arthritis    back and shoulders  . Atrial flutter with rapid ventricular response (HCC) 07/22/2020  . Chest pain    due to cocaine use  . Diabetes mellitus without complication (HCC)   . Drug abuse (HCC)    cocaine, marijuana abuse  . GERD (gastroesophageal reflux disease)   . Hypertension   . Renal insufficiency     Patient Active Problem List   Diagnosis Date Noted  . Foot pain, right 09/26/2020  . Closed nondisplaced fracture of proximal phalanx of lesser toe of right foot 09/26/2020  . Esophagitis   . Midepigastric pain   . Atrial flutter with rapid ventricular response (HCC) 07/22/2020  . Cocaine abuse (HCC)   . COVID-19 virus infection 05/31/2019  . Orthostatic syncope 05/31/2019  . Diabetes mellitus type 2, insulin dependent (HCC) 05/31/2019  . Drug abuse (HCC)   . CKD (chronic kidney disease), stage III (HCC)   . Right ankle pain 05/15/2014  . Uncontrolled hypertension 05/14/2014  . Diabetes mellitus (HCC) 05/14/2014  . Rupture of right Achilles tendon 05/02/2014    Past Surgical History:  Procedure Laterality Date  . ACHILLES TENDON SURGERY Right 05/02/2014   Procedure: RIGHT ACHILLES TENDON REPAIR;  Surgeon: Javier Docker, MD;  Location: [facility];  Service: Orthopedics;  Laterality: Right;  . BIOPSY  07/25/2020    Procedure: BIOPSY;  Surgeon: Kathi Der, MD;  Location: [facility] ENDOSCOPY;  Service: Gastroenterology;;  . CARDIAC CATHETERIZATION  05.31.2012   showing patent coronaries with no visualized vasospasm and EF of 55-60 %  . ESOPHAGOGASTRODUODENOSCOPY N/A 07/25/2020   Procedure: ESOPHAGOGASTRODUODENOSCOPY (EGD);  Surgeon: Kathi Der, MD;  Location: [facility] ENDOSCOPY;  Service: Gastroenterology;  Laterality: N/A;  . ORBITAL FRACTURE SURGERY Left 2-3 yrs ago       Family History  Problem Relation Age of Onset  . Unexplained death Mother 7       She died in 04-21-2016, he does not remember what she died of  . Unexplained death Father        He has no information on his father    Social History   Tobacco Use  . Smoking status: Never Smoker  . Smokeless tobacco: Never Used  Vaping Use  . Vaping Use: Never used  Substance Use Topics  . Alcohol use: Yes    Alcohol/week: 3.0 standard drinks    Types: 3 Cans of beer per week    Comment: occassional  . Drug use: Yes    Types: Cocaine, Marijuana    Comment: last cocaine use 2 to 3 yrs ago, last marijuana use 4 to 5 years ago    Home Medications Prior to Admission medications   Medication Sig Start Date End Date Taking? Authorizing  Provider  apixaban (ELIQUIS) 5 MG TABS tablet Take 1 tablet (5 mg total) by mouth 2 (two) times daily. 07/25/20   Marjie Skiff E, PA-C  cephALEXin (KEFLEX) 500 MG capsule Take 1 capsule (500 mg total) by mouth 2 (two) times daily for 5 days. 09/26/20 10/01/20  Doran Stabler, DO  diltiazem (CARDIZEM CD) 240 MG 24 hr capsule Take 1 capsule (240 mg total) by mouth daily. 07/25/20   Marjie Skiff E, PA-C  furosemide (LASIX) 40 MG tablet Take 1 tablet (40 mg total) by mouth daily. 08/11/20   Marjie Skiff E, PA-C  insulin NPH-regular Human (NOVOLIN 70/30) (70-30) 100 UNIT/ML injection Inject 35 units into the skin 2 times daily with a meal. 07/25/20   Marjie Skiff E, PA-C  losartan (COZAAR) 50 MG tablet Take 1  tablet (50 mg total) by mouth daily. 08/11/20   Corrin Parker, PA-C  oxyCODONE-acetaminophen (PERCOCET) 5-325 MG tablet Take 1 tablet by mouth every 4 (four) hours as needed for up to 5 days for severe pain. 09/26/20 10/01/20  Doran Stabler, DO  pantoprazole (PROTONIX) 40 MG tablet Take 1 tablet (40 mg total) by mouth 2 (two) times daily. 07/25/20   Marjie Skiff E, PA-C  potassium chloride SA (KLOR-CON) 20 MEQ tablet Take 1 tablet (20 mEq total) by mouth daily. 08/11/20   Marjie Skiff E, PA-C  SEROQUEL 50 MG tablet Take 50 mg by mouth at bedtime. 03/03/20   [provider]  sucralfate (CARAFATE) 1 g tablet Take 1 tablet (1 g total) by mouth 2 (two) times daily. 07/25/20 10/23/20  Corrin Parker, PA-C  ZYRTEC ALLERGY 10 MG tablet Take 10 mg by mouth at bedtime. 03/03/20   [provider]    Allergies    Patient has no known allergies.  Review of Systems   Review of Systems  Cardiovascular: Positive for leg swelling.  Musculoskeletal: Positive for joint swelling.       Toe pain    Physical Exam Updated Vital Signs BP (!) 188/98   Pulse 96   Temp 98 F (36.7 C) (Oral)   Resp 15   Ht 5\' 9"  (1.753 m)   Wt 108.4 kg   SpO2 100%   BMI 35.29 kg/m   Physical Exam Constitutional:      General: He is not in acute distress. HENT:     Head: Normocephalic.  Eyes:     General:        Right eye: No discharge.        Left eye: No discharge.  Musculoskeletal:        General: Normal range of motion.     Comments: Superficial abrasion noticed on the right second toe.  +2 pedis pulse palpated.  Patient can move all his toes. +1 right lower extremity  Skin:    General: Skin is warm.  Neurological:     Mental Status: He is alert.  Psychiatric:        Mood and Affect: Mood normal.     ED Results / Procedures / Treatments   Labs (all labs ordered are listed, but only abnormal results are displayed) Labs Reviewed - No data to  display  EKG None  Radiology DG Foot Complete Right  Result Date: 09/26/2020 CLINICAL DATA:  Right foot pain after dropping a grease pot on it. EXAM: RIGHT FOOT COMPLETE - 3+ VIEW COMPARISON:  None. FINDINGS: Cortical irregularity at the medial and lateral base of the second distal phalanx. No dislocation. Joint spaces  are preserved. Small marginal osteophytes of the anterior tibial plafond and dorsal talar head. Prominent Achilles and small plantar enthesophytes. Bone mineralization is normal. Soft tissues are unremarkable. IMPRESSION: 1. Tiny acute fractures of the second distal phalanx base. Electronically Signed   By: Obie Dredge M.D.   On: 09/26/2020 12:58    Procedures Procedures (including critical care time)  Medications Ordered in ED Medications  Tdap (BOOSTRIX) injection 0.5 mL (has no administration in time range)  cephALEXin (KEFLEX) capsule 500 mg (has no administration in time range)  oxyCODONE-acetaminophen (PERCOCET/ROXICET) 5-325 MG per tablet 2 tablet (has no administration in time range)    ED Course  I have reviewed the triage vital signs and the nursing notes.  Pertinent labs & imaging results that were available during my care of the patient were reviewed by me and considered in my medical decision making (see chart for details).  BP (!) 188/98   Pulse 96   Temp 98 F (36.7 C) (Oral)   Resp 15   Ht 5\' 9"  (1.753 m)   Wt 108.4 kg   SpO2 100%   BMI 35.29 kg/m     MDM Rules/Calculators/A&P                          Patient presents to the ED for right foot pain after trauma.  Right foot x-ray shows a tiny fracture at the right distal second phalanx.  Patient is diabetic but reports normal sensation of the feet.  +2 pedis pulse palpated.  Patient can move all his toes on examination.  I buddy taped his second toe to the great toe for stability.  Patient will be discharged with Percocet for pain and Keflex for antibiotic.  Because he is diabetic, referral  to podiatry was sent for follow-up.  Come back to the ED for worsening pain or new neurological symptoms.  Patient agrees with the plan.   Final Clinical Impression(s) / ED Diagnoses Final diagnoses:  Foot pain, right  Closed nondisplaced fracture of distal phalanx of lesser toe of right foot, initial encounter    Rx / DC Orders ED Discharge Orders         Ordered    cephALEXin (KEFLEX) 500 MG capsule  2 times daily        09/26/20 1740    Ambulatory referral to Podiatry        09/26/20 1740    oxyCODONE-acetaminophen (PERCOCET) 5-325 MG tablet  Every 4 hours PRN        09/26/20 1742           09/28/20, DO 09/26/20 1751    09/28/20, MD 09/29/20 [phone]

## 2020-09-26 NOTE — Progress Notes (Signed)
Orthopedic Tech Progress Note Patient Details:  Ryan [name] 1961/06/16 [id number]  Ortho Devices Type of Ortho Device: Postop shoe/boot Ortho Device/Splint Location: [facility] Ortho Device/Splint Interventions: Ordered, Application   Post Interventions Patient Tolerated: Well Instructions Provided: Adjustment of device   Phu Record A Ryan [name] 09/26/2020, 5:49 PM

## 2020-09-26 NOTE — ED Notes (Signed)
Ortho tech at bedside 

## 2020-10-23 ENCOUNTER — Emergency Department (HOSPITAL_COMMUNITY)
Admission: EM | Admit: 2020-10-23 | Discharge: 2020-10-23 | Disposition: A | Discharge status: Home / Self Care | Payer: Self-pay | Attending: Emergency Medicine | Admitting: Emergency Medicine

## 2020-10-23 ENCOUNTER — Encounter (HOSPITAL_COMMUNITY): Payer: Self-pay

## 2020-10-23 DIAGNOSIS — E119 Type 2 diabetes mellitus without complications: Secondary | ICD-10-CM | POA: Insufficient documentation

## 2020-10-23 DIAGNOSIS — N183 Chronic kidney disease, stage 3 unspecified: Secondary | ICD-10-CM | POA: Insufficient documentation

## 2020-10-23 DIAGNOSIS — Z7901 Long term (current) use of anticoagulants: Secondary | ICD-10-CM | POA: Insufficient documentation

## 2020-10-23 DIAGNOSIS — M79674 Pain in right toe(s): Secondary | ICD-10-CM | POA: Insufficient documentation

## 2020-10-23 DIAGNOSIS — Z794 Long term (current) use of insulin: Secondary | ICD-10-CM | POA: Insufficient documentation

## 2020-10-23 DIAGNOSIS — Z8616 Personal history of COVID-19: Secondary | ICD-10-CM | POA: Insufficient documentation

## 2020-10-23 DIAGNOSIS — I129 Hypertensive chronic kidney disease with stage 1 through stage 4 chronic kidney disease, or unspecified chronic kidney disease: Secondary | ICD-10-CM | POA: Insufficient documentation

## 2020-10-23 NOTE — Discharge Instructions (Signed)
I have given you contact information below for an orthopedist.  You can return to the emergency department with new or worsening symptoms.  It was a pleasure to meet you.

## 2020-10-23 NOTE — ED Provider Notes (Signed)
[facility] EMERGENCY DEPARTMENT Provider Note   CSN: [id number] Arrival date & time: 10/23/20  1214     History Chief Complaint  Patient presents with  . Foot Pain    Ryan [name] is a 59 y.o. male.  HPI Patient is a 59 year old male with a medical history as noted below.  Patient was initially seen on October 15 and diagnosed with tiny acute fractures of the second distal phalanx on the right foot.  States that he took his Percocet and has continued to wear his postop shoe.  He reports continued pain.  Requests additional pain medications.  Also states that he was not given a orthopedic referral.  He requested new referral today.  No fevers, chills, swelling, discharge from the region.     Past Medical History:  Diagnosis Date  . Achilles tendon rupture right  . Arthritis    back and shoulders  . Atrial flutter with rapid ventricular response (HCC) 07/22/2020  . Chest pain    due to cocaine use  . Diabetes mellitus without complication (HCC)   . Drug abuse (HCC)    cocaine, marijuana abuse  . GERD (gastroesophageal reflux disease)   . Hypertension   . Renal insufficiency     Patient Active Problem List   Diagnosis Date Noted  . Foot pain, right 09/26/2020  . Closed nondisplaced fracture of proximal phalanx of lesser toe of right foot 09/26/2020  . Esophagitis   . Midepigastric pain   . Atrial flutter with rapid ventricular response (HCC) 07/22/2020  . Cocaine abuse (HCC)   . COVID-19 virus infection 05/31/2019  . Orthostatic syncope 05/31/2019  . Diabetes mellitus type 2, insulin dependent (HCC) 05/31/2019  . Drug abuse (HCC)   . CKD (chronic kidney disease), stage III (HCC)   . Right ankle pain 05/15/2014  . Uncontrolled hypertension 05/14/2014  . Diabetes mellitus (HCC) 05/14/2014  . Rupture of right Achilles tendon 05/02/2014    Past Surgical History:  Procedure Laterality Date  . ACHILLES TENDON SURGERY Right 05/02/2014    Procedure: RIGHT ACHILLES TENDON REPAIR;  Surgeon: Javier Docker, MD;  Location: [facility];  Service: Orthopedics;  Laterality: Right;  . BIOPSY  07/25/2020   Procedure: BIOPSY;  Surgeon: Kathi Der, MD;  Location: [facility]OS[facility];  Service: Gastroenterology;;  . CARDIAC CATHETERIZATION  05.31.2012   showing patent coronaries with no visualized vasospasm and EF of 55-60 %  . ESOPHAGOGASTRODUODENOSCOPY N/A 07/25/2020   Procedure: ESOPHAGOGASTRODUODENOSCOPY (EGD);  Surgeon: Kathi Der, MD;  Location: [facility] ENDOSCOPY;  Service: Gastroenterology;  Laterality: N/A;  . ORBITAL FRACTURE SURGERY Left 2-3 yrs ago       Family History  Problem Relation Age of Onset  . Unexplained death Mother 49       She died in Apr 07, 2016, he does not remember what she died of  . Unexplained death Father        He has no information on his father    Social History   Tobacco Use  . Smoking status: Never Smoker  . Smokeless tobacco: Never Used  Vaping Use  . Vaping Use: Never used  Substance Use Topics  . Alcohol use: Yes    Alcohol/week: 3.0 standard drinks    Types: 3 Cans of beer per week    Comment: occassional  . Drug use: Yes    Types: Cocaine, Marijuana    Comment: last cocaine use 2 to 3 yrs ago, last marijuana use 4 to 5 years ago  Home Medications Prior to Admission medications   Medication Sig Start Date End Date Taking? Authorizing Provider  apixaban (ELIQUIS) 5 MG TABS tablet Take 1 tablet (5 mg total) by mouth 2 (two) times daily. 07/25/20   Corrin Parker, PA-C  diltiazem (CARDIZEM CD) 240 MG 24 hr capsule Take 1 capsule (240 mg total) by mouth daily. 07/25/20   Marjie Skiff E, PA-C  furosemide (LASIX) 40 MG tablet Take 1 tablet (40 mg total) by mouth daily. 08/11/20   Marjie Skiff E, PA-C  insulin NPH-regular Human (NOVOLIN 70/30) (70-30) 100 UNIT/ML injection Inject 35 units into the skin 2 times daily with a meal. 07/25/20   Marjie Skiff E, PA-C  losartan (COZAAR) 50  MG tablet Take 1 tablet (50 mg total) by mouth daily. 08/11/20   Marjie Skiff E, PA-C  pantoprazole (PROTONIX) 40 MG tablet Take 1 tablet (40 mg total) by mouth 2 (two) times daily. 07/25/20   Marjie Skiff E, PA-C  potassium chloride SA (KLOR-CON) 20 MEQ tablet Take 1 tablet (20 mEq total) by mouth daily. 08/11/20   Marjie Skiff E, PA-C  SEROQUEL 50 MG tablet Take 50 mg by mouth at bedtime. 03/03/20   [provider]  sucralfate (CARAFATE) 1 g tablet Take 1 tablet (1 g total) by mouth 2 (two) times daily. 07/25/20 10/23/20  Corrin Parker, PA-C  ZYRTEC ALLERGY 10 MG tablet Take 10 mg by mouth at bedtime. 03/03/20   [provider]    Allergies    Patient has no known allergies.  Review of Systems   Review of Systems  Musculoskeletal: Positive for arthralgias and myalgias.  Skin: Negative for color change and wound.  Neurological: Negative for numbness.   Physical Exam Updated Vital Signs BP (!) 156/108   Pulse (!) 106   Temp 98.3 F (36.8 C) (Oral)   Resp 16   Ht 5\' 9"  (1.753 m)   Wt 108.4 kg   SpO2 100%   BMI 35.29 kg/m   Physical Exam Vitals and nursing note reviewed.  Constitutional:      General: He is not in acute distress.    Appearance: He is well-developed.  HENT:     Head: Normocephalic and atraumatic.     Right Ear: External ear normal.     Left Ear: External ear normal.  Eyes:     General: No scleral icterus.       Right eye: No discharge.        Left eye: No discharge.     Conjunctiva/sclera: Conjunctivae normal.  Neck:     Trachea: No tracheal deviation.  Cardiovascular:     Rate and Rhythm: Normal rate.  Pulmonary:     Effort: Pulmonary effort is normal. No respiratory distress.     Breath sounds: No stridor.  Abdominal:     General: There is no distension.  Musculoskeletal:        General: Tenderness present. No swelling or deformity.     Cervical back: Neck supple.     Comments: Mild tenderness noted along the distal  aspect of the right second toe.  No wounds, erythema, or edema.  Distal sensation intact.  Palpable pedal pulses.  Skin:    General: Skin is warm and dry.     Findings: No rash.  Neurological:     Mental Status: He is alert.     Cranial Nerves: Cranial nerve deficit: no gross deficits.    ED Results / Procedures / Treatments  Labs (all labs ordered are listed, but only abnormal results are displayed) Labs Reviewed - No data to display  EKG None  Radiology No results found.  Procedures Procedures (including critical care time)  Medications Ordered in ED Medications - No data to display  ED Course  I have reviewed the triage vital signs and the nursing notes.  Pertinent labs & imaging results that were available during my care of the patient were reviewed by me and considered in my medical decision making (see chart for details).    MDM Rules/Calculators/A&P                          Patient presents with chronic right second toe pain after being diagnosed with a fracture on October 15.  No other acute complaints.  No signs of infection. Requests a new orthopedic referral.  Additionally requested a refill of his Percocet.  Patient informed that this would not be possible and he verbalized understanding.  Recommended continued use of OTC pain meds.  Recommended continued use of postop shoe as needed.  Range of motion exercises as pain tolerates.  He was given a new orthopedic referral.  Recommended that he call them to schedule a follow-up appointment.  Return to the ED with new or worsening symptoms.  His questions were answered and he was amicable at the time of discharge.  Final Clinical Impression(s) / ED Diagnoses Final diagnoses:  Pain of toe of right foot    Rx / DC Orders ED Discharge Orders    None       Placido Sou, PA-C 10/23/20 1304    Tegeler, Canary Brim, MD 10/23/20 1623

## 2020-10-23 NOTE — ED Triage Notes (Signed)
Pt arrives to ED w/ 10/10 R foot pain after breaking toes on 10/15. Pt states he was supposed to get a referral to [facility]ho however did not get one.

## 2020-11-11 ENCOUNTER — Ambulatory Visit: Payer: Self-pay | Admitting: Internal Medicine

## 2021-03-11 ENCOUNTER — Emergency Department (HOSPITAL_COMMUNITY): Payer: Self-pay

## 2021-03-11 ENCOUNTER — Emergency Department (HOSPITAL_COMMUNITY)
Admission: EM | Admit: 2021-03-11 | Discharge: 2021-03-11 | Disposition: A | Discharge status: Home / Self Care | Payer: Self-pay | Attending: Emergency Medicine | Admitting: Emergency Medicine

## 2021-03-11 ENCOUNTER — Other Ambulatory Visit: Payer: Self-pay

## 2021-03-11 DIAGNOSIS — Z79899 Other long term (current) drug therapy: Secondary | ICD-10-CM | POA: Insufficient documentation

## 2021-03-11 DIAGNOSIS — R600 Localized edema: Secondary | ICD-10-CM | POA: Insufficient documentation

## 2021-03-11 DIAGNOSIS — N183 Chronic kidney disease, stage 3 unspecified: Secondary | ICD-10-CM | POA: Insufficient documentation

## 2021-03-11 DIAGNOSIS — Z8616 Personal history of COVID-19: Secondary | ICD-10-CM | POA: Insufficient documentation

## 2021-03-11 DIAGNOSIS — I129 Hypertensive chronic kidney disease with stage 1 through stage 4 chronic kidney disease, or unspecified chronic kidney disease: Secondary | ICD-10-CM | POA: Insufficient documentation

## 2021-03-11 DIAGNOSIS — Z7901 Long term (current) use of anticoagulants: Secondary | ICD-10-CM | POA: Insufficient documentation

## 2021-03-11 DIAGNOSIS — R519 Headache, unspecified: Secondary | ICD-10-CM

## 2021-03-11 DIAGNOSIS — E1122 Type 2 diabetes mellitus with diabetic chronic kidney disease: Secondary | ICD-10-CM | POA: Insufficient documentation

## 2021-03-11 DIAGNOSIS — J0110 Acute frontal sinusitis, unspecified: Secondary | ICD-10-CM | POA: Insufficient documentation

## 2021-03-11 DIAGNOSIS — Z7984 Long term (current) use of oral hypoglycemic drugs: Secondary | ICD-10-CM | POA: Insufficient documentation

## 2021-03-11 DIAGNOSIS — Z794 Long term (current) use of insulin: Secondary | ICD-10-CM | POA: Insufficient documentation

## 2021-03-11 LAB — CBC WITH DIFFERENTIAL/PLATELET
Abs Immature Granulocytes: 0.01 10*3/uL (ref 0.00–0.07)
Basophils Absolute: 0 10*3/uL (ref 0.0–0.1)
Basophils Relative: 1 %
Eosinophils Absolute: 0.1 10*3/uL (ref 0.0–0.5)
Eosinophils Relative: 1 %
HCT: 37.1 % — ABNORMAL LOW (ref 39.0–52.0)
Hemoglobin: 11.8 g/dL — ABNORMAL LOW (ref 13.0–17.0)
Immature Granulocytes: 0 %
Lymphocytes Relative: 36 %
Lymphs Abs: 2.2 10*3/uL (ref 0.7–4.0)
MCH: 25.8 pg — ABNORMAL LOW (ref 26.0–34.0)
MCHC: 31.8 g/dL (ref 30.0–36.0)
MCV: 81.2 fL (ref 80.0–100.0)
Monocytes Absolute: 0.6 10*3/uL (ref 0.1–1.0)
Monocytes Relative: 10 %
Neutro Abs: 3.3 10*3/uL (ref 1.7–7.7)
Neutrophils Relative %: 52 %
Platelets: 252 10*3/uL (ref 150–400)
RBC: 4.57 MIL/uL (ref 4.22–5.81)
RDW: 17.2 % — ABNORMAL HIGH (ref 11.5–15.5)
WBC: 6.2 10*3/uL (ref 4.0–10.5)
nRBC: 0 % (ref 0.0–0.2)

## 2021-03-11 LAB — CBG MONITORING, ED: Glucose-Capillary: 169 mg/dL — ABNORMAL HIGH (ref 70–99)

## 2021-03-11 LAB — COMPREHENSIVE METABOLIC PANEL
ALT: 15 U/L (ref 0–44)
AST: 25 U/L (ref 15–41)
Albumin: 3.1 g/dL — ABNORMAL LOW (ref 3.5–5.0)
Alkaline Phosphatase: 79 U/L (ref 38–126)
Anion gap: 10 (ref 5–15)
BUN: 13 mg/dL (ref 6–20)
CO2: 19 mmol/L — ABNORMAL LOW (ref 22–32)
Calcium: 8.4 mg/dL — ABNORMAL LOW (ref 8.9–10.3)
Chloride: 109 mmol/L (ref 98–111)
Creatinine, Ser: 1.31 mg/dL — ABNORMAL HIGH (ref 0.61–1.24)
GFR, Estimated: >60 mL/min (ref >60)
Glucose, Bld: 195 mg/dL — ABNORMAL HIGH (ref 70–99)
Potassium: 3.5 mmol/L (ref 3.5–5.1)
Sodium: 138 mmol/L (ref 135–145)
Total Bilirubin: 0.3 mg/dL (ref 0.3–1.2)
Total Protein: 6.8 g/dL (ref 6.5–8.1)

## 2021-03-11 LAB — ETHANOL: Alcohol, Ethyl (B): 79 mg/dL — ABNORMAL HIGH (ref <10)

## 2021-03-11 LAB — I-STAT BETA HCG BLOOD, ED (MC, WL, AP ONLY): I-stat hCG, quantitative: <5 m[IU]/mL (ref <5)

## 2021-03-11 MED ORDER — ACETAMINOPHEN 325 MG PO TABS
650.0000 mg | ORAL_TABLET | Freq: Once | ORAL | Status: AC
  Administered 2021-03-11: 650 mg via ORAL
  Filled 2021-03-11: qty 2

## 2021-03-11 MED ORDER — LABETALOL HCL 5 MG/ML IV SOLN: 10.0000 mg | Freq: Once | INTRAVENOUS | Status: DC

## 2021-03-11 MED ORDER — AMOXICILLIN-POT CLAVULANATE 875-125 MG PO TABS
1.0000 | ORAL_TABLET | Freq: Two times a day (BID) | ORAL | 0 refills | Status: DC
Start: 2021-03-11 — End: 2021-05-07

## 2021-03-11 MED ORDER — IOHEXOL 350 MG/ML SOLN
75.0000 mL | Freq: Once | INTRAVENOUS | Status: AC | PRN
  Administered 2021-03-11: 75 mL via INTRAVENOUS

## 2021-03-11 MED ORDER — LOSARTAN POTASSIUM 50 MG PO TABS
100.0000 mg | ORAL_TABLET | Freq: Once | ORAL | Status: AC
  Administered 2021-03-11: 100 mg via ORAL
  Filled 2021-03-11: qty 2

## 2021-03-11 MED ORDER — METOCLOPRAMIDE HCL 5 MG/ML IJ SOLN
10.0000 mg | Freq: Once | INTRAMUSCULAR | Status: AC
  Administered 2021-03-11: 10 mg via INTRAVENOUS
  Filled 2021-03-11: qty 2

## 2021-03-11 NOTE — ED Triage Notes (Signed)
Pt bib ems from home w/c/o headache x4 days. HTN 200/130 160/90. C/o bilateral blurred vision just before arriving to ED. Pt has had some beer today. cbg 165. Ems reports a run of SVT around 150 bpm no strip provided.

## 2021-03-11 NOTE — ED Notes (Signed)
PROVIDED WATER AT THIS TIME. PER PROVIDER IT WAS OK THAT HE HAD SOMETHING

## 2021-03-11 NOTE — ED Notes (Signed)
Patient transported to CT 

## 2021-03-11 NOTE — ED Notes (Signed)
RECEIVED VERBAL REPORT FROM PAIGE AT THIS TIME 

## 2021-03-11 NOTE — ED Provider Notes (Signed)
[facility] Provider Note   CSN: [id number] Arrival date & time: 03/11/21  1710     History Chief Complaint  Patient presents with  . Headache    Ryan [name] is a 60 y.o. male.  The history is provided by the patient, the EMS personnel and medical records.  Headache  Ryan [name] is a 60 y.o. male who presents to the Emergency Department complaining of headache. He presents the emergency department by EMS complaining of severe global headache for the last four days. Patient headache is described as involving his entire head, primarily over the face. It is constant in nature. No alleviating or worsening factors. It was gradual in onset. Denies any fevers, nausea, vomiting, numbness, weakness. He did not take his blood pressure medication today. He does have a history of diabetes, hypertension. Symptoms are severe and constant nature. He states that he drinks alcohol once a week, last drink three days ago. Per EMS report patient had several beers today. Patient denies any drug use on initial examination. When mentioned that we cannot give certain medications if he is recently ingested cocaine he is unsure of when he last use drugs. He is not compliant with his eloquence due to cost. It is unclear when he last took this medication. No nasal congestion or drainage..    Past Medical History:  Diagnosis Date  . Achilles tendon rupture right  . Arthritis    back and shoulders  . Atrial flutter with rapid ventricular response (HCC) 07/22/2020  . Chest pain    due to cocaine use  . Diabetes mellitus without complication (HCC)   . Drug abuse (HCC)    cocaine, marijuana abuse  . GERD (gastroesophageal reflux disease)   . Hypertension   . Renal insufficiency     Patient Active Problem List   Diagnosis Date Noted  . Foot pain, right 09/26/2020  . Closed nondisplaced fracture of proximal phalanx of lesser toe of right foot  09/26/2020  . Esophagitis   . Midepigastric pain   . Atrial flutter with rapid ventricular response (HCC) 07/22/2020  . Cocaine abuse (HCC)   . COVID-19 virus infection 05/31/2019  . Orthostatic syncope 05/31/2019  . Diabetes mellitus type 2, insulin dependent (HCC) 05/31/2019  . Drug abuse (HCC)   . CKD (chronic kidney disease), stage III (HCC)   . Right ankle pain 05/15/2014  . Uncontrolled hypertension 05/14/2014  . Diabetes mellitus (HCC) 05/14/2014  . Rupture of right Achilles tendon 05/02/2014    Past Surgical History:  Procedure Laterality Date  . ACHILLES TENDON SURGERY Right 05/02/2014   Procedure: RIGHT ACHILLES TENDON REPAIR;  Surgeon: Javier DockerJeffrey C Beane, MD;  Location: [facility] ORS;  Service: Orthopedics;  Laterality: Right;  . BIOPSY  07/25/2020   Procedure: BIOPSY;  Surgeon: Kathi DerBrahmbhatt, Parag, MD;  Location: [facility] ENDOSCOPY;  Service: Gastroenterology;;  . CARDIAC CATHETERIZATION  05.31.2012   showing patent coronaries with no visualized vasospasm and EF of 55-60 %  . ESOPHAGOGASTRODUODENOSCOPY N/A 07/25/2020   Procedure: ESOPHAGOGASTRODUODENOSCOPY (EGD);  Surgeon: Kathi DerBrahmbhatt, Parag, MD;  [facility];  Service: Gastroenterology;  Laterality: N/A;  . ORBITAL FRACTURE SURGERY Left 2-3 yrs ago       Family History  Problem Relation Age of Onset  . Unexplained death Mother 2383       She died in 2017, he does not remember what she died of  . Unexplained death Father        He has no information  on his father    Social History   Tobacco Use  . Smoking status: Never Smoker  . Smokeless tobacco: Never Used  Vaping Use  . Vaping Use: Never used  Substance Use Topics  . Alcohol use: Yes    Alcohol/week: 3.0 standard drinks    Types: 3 Cans of beer per week    Comment: occassional  . Drug use: Yes    Types: Cocaine, Marijuana    Comment: last cocaine use 2 to 3 yrs ago, last marijuana use 4 to 5 years ago    Home Medications Prior to Admission medications    Medication Sig Start Date End Date Taking? Authorizing Provider  amoxicillin-clavulanate (AUGMENTIN) 875-125 MG tablet Take 1 tablet by mouth every 12 (twelve) hours. 03/11/21  Yes Tilden Fossa, MD  diltiazem (CARDIZEM CD) 240 MG 24 hr capsule Take 1 capsule (240 mg total) by mouth daily. 07/25/20  Yes Marjie Skiff E, PA-C  furosemide (LASIX) 40 MG tablet Take 1 tablet (40 mg total) by mouth daily. 08/11/20  Yes Irene Limbo, Callie E, PA-C  glipiZIDE (GLUCOTROL) 10 MG tablet Take 10 mg by mouth daily. 03/04/21  Yes [provider]  insulin NPH-regular Human (NOVOLIN 70/30) (70-30) 100 UNIT/ML injection Inject 35 units into the skin 2 times daily with a meal. 07/25/20  Yes Irene Limbo, Callie E, PA-C  losartan (COZAAR) 100 MG tablet Take 100 mg by mouth daily. 03/04/21  Yes [provider]  omeprazole (PRILOSEC) 40 MG capsule Take 40 mg by mouth daily. 01/07/21  Yes [provider]  Potassium Chloride ER 20 MEQ TBCR Take 1 tablet by mouth daily. 03/04/21  Yes [provider]  ZYRTEC ALLERGY 10 MG tablet Take 10 mg by mouth at bedtime. 03/03/20  Yes [provider]  apixaban (ELIQUIS) 5 MG TABS tablet Take 1 tablet (5 mg total) by mouth 2 (two) times daily. 07/25/20   Marjie Skiff E, PA-C  losartan (COZAAR) 50 MG tablet Take 1 tablet (50 mg total) by mouth daily. Patient not taking: No sig reported 08/11/20   Marjie Skiff E, PA-C  pantoprazole (PROTONIX) 40 MG tablet Take 1 tablet (40 mg total) by mouth 2 (two) times daily. Patient not taking: No sig reported 07/25/20   Marjie Skiff E, PA-C  potassium chloride SA (KLOR-CON) 20 MEQ tablet Take 1 tablet (20 mEq total) by mouth daily. Patient not taking: No sig reported 08/11/20   Marjie Skiff E, PA-C  SEROQUEL 50 MG tablet Take 50 mg by mouth at bedtime. Patient not taking: Reported on 03/11/2021 03/03/20   [provider]  sucralfate (CARAFATE) 1 g tablet Take 1 tablet (1 g total) by mouth 2  (two) times daily. 07/25/20 10/23/20  Corrin Parker, PA-C    Allergies    Patient has no known allergies.  Review of Systems   Review of Systems  Neurological: Positive for headaches.  All other systems reviewed and are negative.   Physical Exam Updated Vital Signs BP (!) 153/101   Pulse 76   Temp 98.4 F (36.9 C) (Oral)   Resp (!) 24   Ht  (1.753 m)   Wt 109.8 kg   SpO2 93%   BMI 35.74 kg/m   Physical Exam Vitals and nursing note reviewed.  Constitutional:      General: He is in acute distress.     Appearance: He is well-developed.  HENT:     Head: Normocephalic and atraumatic.  Cardiovascular:     Rate and  Rhythm: Normal rate and regular rhythm.     Heart sounds: No murmur heard.   Pulmonary:     Effort: Pulmonary effort is normal. No respiratory distress.     Breath sounds: Normal breath sounds.  Abdominal:     Palpations: Abdomen is soft.     Tenderness: There is no abdominal tenderness. There is no guarding or rebound.  Musculoskeletal:        General: No tenderness.     Comments: Pitting edema to BLE  Skin:    General: Skin is warm and dry.  Neurological:     Mental Status: He is alert and oriented to person, place, and time.     Comments: 5/5 strength in all four extremities.  Dysconjugate gaze.  Visual fields grossly intact. Photophobia.  Psychiatric:        Behavior: Behavior normal.     ED Results / Procedures / Treatments   Labs (all labs ordered are listed, but only abnormal results are displayed) Labs Reviewed  COMPREHENSIVE METABOLIC PANEL - Abnormal; Notable for the following components:      Result Value   CO2 19 (*)    Glucose, Bld 195 (*)    Creatinine, Ser 1.31 (*)    Calcium 8.4 (*)    Albumin 3.1 (*)    All other components within normal limits  CBC WITH DIFFERENTIAL/PLATELET - Abnormal; Notable for the following components:   Hemoglobin 11.8 (*)    HCT 37.1 (*)    MCH 25.8 (*)    RDW 17.2 (*)    All other  components within normal limits  ETHANOL - Abnormal; Notable for the following components:   Alcohol, Ethyl (B) 79 (*)    All other components within normal limits  CBG MONITORING, ED - Abnormal; Notable for the following components:   Glucose-Capillary 169 (*)    All other components within normal limits  I-STAT CHEM 8, ED  I-STAT BETA HCG BLOOD, ED ([facility], [facility], [facility] ONLY)    EKG EKG Interpretation  Date/Time:  Wednesday March 11 2021 17:30:13 EDT Ventricular Rate:  88 PR Interval:  184 QRS Duration: 77 QT Interval:  339 QTC Calculation: 411 R Axis:   41 Text Interpretation: Sinus rhythm Probable left atrial enlargement Borderline repolarization abnormality Borderline ST elevation, anterior leads Confirmed by Tilden Fossa [phone]) on 03/11/2021 5:39:03 PM   Radiology CT Angio Head W/Cm &/Or Wo Cm  Result Date: 03/11/2021 CLINICAL DATA:  Severe headache with blurry vision EXAM: CT ANGIOGRAPHY HEAD AND NECK TECHNIQUE: Multidetector CT imaging of the head and neck was performed using the standard protocol during bolus administration of intravenous contrast. Multiplanar CT image reconstructions and MIPs were obtained to evaluate the vascular anatomy. Carotid stenosis measurements (when applicable) are obtained utilizing NASCET criteria, using the distal internal carotid diameter as the denominator. CONTRAST:  65mL OMNIPAQUE IOHEXOL 350 MG/ML SOLN COMPARISON:  None. FINDINGS: CT HEAD FINDINGS Brain: There is no mass, hemorrhage or extra-axial collection. The size and configuration of the ventricles and extra-axial CSF spaces are normal. There is no acute or chronic infarction. The brain parenchyma is normal. Skull: The visualized skull base, calvarium and extracranial soft tissues are normal. Sinuses/Orbits: Diffuse sinus mucosal thickening with frontal sinus fluid levels. The orbits are normal. CTA NECK FINDINGS SKELETON: There is no bony spinal canal stenosis. No lytic or blastic lesion. OTHER  NECK: Normal pharynx, larynx and major salivary glands. No cervical lymphadenopathy. Unremarkable thyroid gland. UPPER CHEST: No pneumothorax or pleural effusion. No nodules  or masses. AORTIC ARCH: There is no calcific atherosclerosis of the aortic arch. There is no aneurysm, dissection or hemodynamically significant stenosis of the visualized portion of the aorta. Conventional 3 vessel aortic branching pattern. The visualized proximal subclavian arteries are widely patent. RIGHT CAROTID SYSTEM: Normal without aneurysm, dissection or stenosis. LEFT CAROTID SYSTEM: Normal without aneurysm, dissection or stenosis. VERTEBRAL ARTERIES: Right dominant configuration. Both origins are clearly patent. There is no dissection, occlusion or flow-limiting stenosis to the skull base (V1-V3 segments). CTA HEAD FINDINGS POSTERIOR CIRCULATION: --Vertebral arteries: Normal V4 segments. --Inferior cerebellar arteries: Normal. --Basilar artery: Normal. --Superior cerebellar arteries: Normal. --Posterior cerebral arteries (PCA): Normal. ANTERIOR CIRCULATION: --Intracranial internal carotid arteries: Normal. --Anterior cerebral arteries (ACA): Normal. Both A1 segments are present. Patent anterior communicating artery (a-comm). --Middle cerebral arteries (MCA): Normal. VENOUS SINUSES: As permitted by contrast timing, patent. ANATOMIC VARIANTS: None Review of the MIP images confirms the above findings. IMPRESSION: 1. No emergent large vessel occlusion or high-grade stenosis of the intracranial arteries. 2. Diffuse sinus mucosal thickening with frontal sinus fluid levels. Correlate for symptoms of acute sinusitis. Electronically Signed   By: Deatra Robinson M.D.   On: 03/11/2021 20:21   CT Angio Neck W and/or Wo Contrast  Result Date: 03/11/2021 CLINICAL DATA:  Severe headache with blurry vision EXAM: CT ANGIOGRAPHY HEAD AND NECK TECHNIQUE: Multidetector CT imaging of the head and neck was performed using the standard protocol during  bolus administration of intravenous contrast. Multiplanar CT image reconstructions and MIPs were obtained to evaluate the vascular anatomy. Carotid stenosis measurements (when applicable) are obtained utilizing NASCET criteria, using the distal internal carotid diameter as the denominator. CONTRAST:  9mL OMNIPAQUE IOHEXOL 350 MG/ML SOLN COMPARISON:  None. FINDINGS: CT HEAD FINDINGS Brain: There is no mass, hemorrhage or extra-axial collection. The size and configuration of the ventricles and extra-axial CSF spaces are normal. There is no acute or chronic infarction. The brain parenchyma is normal. Skull: The visualized skull base, calvarium and extracranial soft tissues are normal. Sinuses/Orbits: Diffuse sinus mucosal thickening with frontal sinus fluid levels. The orbits are normal. CTA NECK FINDINGS SKELETON: There is no bony spinal canal stenosis. No lytic or blastic lesion. OTHER NECK: Normal pharynx, larynx and major salivary glands. No cervical lymphadenopathy. Unremarkable thyroid gland. UPPER CHEST: No pneumothorax or pleural effusion. No nodules or masses. AORTIC ARCH: There is no calcific atherosclerosis of the aortic arch. There is no aneurysm, dissection or hemodynamically significant stenosis of the visualized portion of the aorta. Conventional 3 vessel aortic branching pattern. The visualized proximal subclavian arteries are widely patent. RIGHT CAROTID SYSTEM: Normal without aneurysm, dissection or stenosis. LEFT CAROTID SYSTEM: Normal without aneurysm, dissection or stenosis. VERTEBRAL ARTERIES: Right dominant configuration. Both origins are clearly patent. There is no dissection, occlusion or flow-limiting stenosis to the skull base (V1-V3 segments). CTA HEAD FINDINGS POSTERIOR CIRCULATION: --Vertebral arteries: Normal V4 segments. --Inferior cerebellar arteries: Normal. --Basilar artery: Normal. --Superior cerebellar arteries: Normal. --Posterior cerebral arteries (PCA): Normal. ANTERIOR  CIRCULATION: --Intracranial internal carotid arteries: Normal. --Anterior cerebral arteries (ACA): Normal. Both A1 segments are present. Patent anterior communicating artery (a-comm). --Middle cerebral arteries (MCA): Normal. VENOUS SINUSES: As permitted by contrast timing, patent. ANATOMIC VARIANTS: None Review of the MIP images confirms the above findings. IMPRESSION: 1. No emergent large vessel occlusion or high-grade stenosis of the intracranial arteries. 2. Diffuse sinus mucosal thickening with frontal sinus fluid levels. Correlate for symptoms of acute sinusitis. Electronically Signed   By: Deatra Robinson M.D.   On: 03/11/2021  20:21    Procedures Procedures   Medications Ordered in ED Medications  metoCLOPramide (REGLAN) injection 10 mg (10 mg Intravenous Given 03/11/21 1826)  iohexol (OMNIPAQUE) 350 MG/ML injection 75 mL (75 mLs Intravenous Contrast Given 03/11/21 1957)  acetaminophen (TYLENOL) tablet 650 mg (650 mg Oral Given 03/11/21 2140)  losartan (COZAAR) tablet 100 mg (100 mg Oral Given 03/11/21 2140)    ED Course  I have reviewed the triage vital signs and the nursing notes.  Pertinent labs & imaging results that were available during my care of the patient were reviewed by me and considered in my medical decision making (see chart for details).    MDM Rules/Calculators/A&P                         patient with history of hypertension, diabetes here for evaluation of headache that is been present for four days. He does have discounted get gaze on examination, which he states that is chronic. He does have significant photophobia and is slow to answer questions. Initial concern for possible subarachnoid hemorrhage based on his presentation. Imaging is negative for acute bleed or aneurysm. Discussed with patient recommendation for LP to further rule out bleed and he declines at this time. His headache is improved after treatment in the department. Presentation is not consistent with  meningitis. Given the predominance of frontal headache and CT findings consistent with sinusitis will treat with antibiotics. Plan to discharge home with outpatient follow-up and return precautions. Final Clinical Impression(s) / ED Diagnoses Final diagnoses:  Bad headache  Acute non-recurrent frontal sinusitis    Rx / DC Orders ED Discharge Orders         Ordered    amoxicillin-clavulanate (AUGMENTIN) 875-125 MG tablet  Every 12 hours        03/11/21 2114           Tilden Fossa, MD 03/11/21 2318

## 2021-03-11 NOTE — ED Notes (Signed)
PT RETURNED FROM CT AT THIS TIME

## 2021-03-11 NOTE — ED Notes (Signed)
PT AWAKE WHEN ENTERED THE ROOM. LIGHTS ARE OFF. REQUEST SOMETHING TO DRINK AND A WORK NOTE. ADVISED WOULD SPEAK TO THE PROVIDER AND VERIFY THAT HE CAN HAVE SOMETHING TO DRINK AND I WOULD LET THEM KNOW ABOUT THE WORK NOTE

## 2021-04-27 IMAGING — CT CT ANGIOGRAPHY CHEST
2 of 7 series · 18 of 46 positions shown · IV contrast (ISOVUE)
Comparison: 02/26/2018

CLINICAL DATA: PE suspected, syncope

EXAM:
CT ANGIOGRAPHY CHEST WITH CONTRAST
TECHNIQUE: Multidetector CT imaging of the chest was performed using the
standard protocol during bolus administration of intravenous
contrast. Multiplanar CT image reconstructions and MIPs were
obtained to evaluate the vascular anatomy.
CONTRAST:  100mL OMNIPAQUE IOHEXOL 350 MG/ML SOLN

[Series 6: thins · axial · 0.81mm/px · z∈[-274,-36]mm · 15 of 270 slices shown]
[im 16/270  lung]
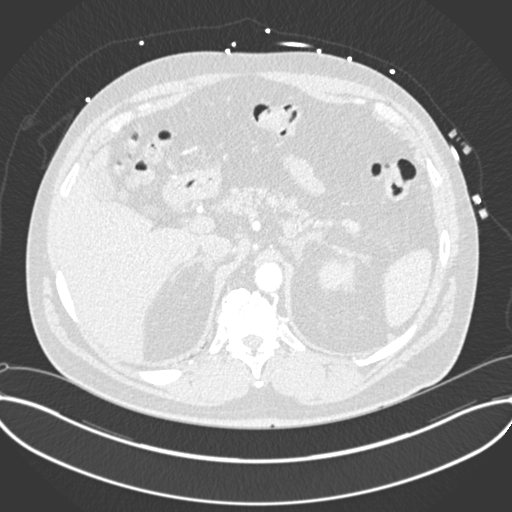
[im 32/270  soft-tissue]
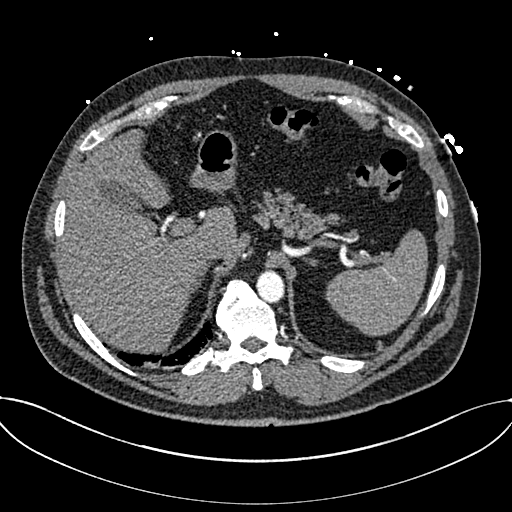
[im 48/270  lung]
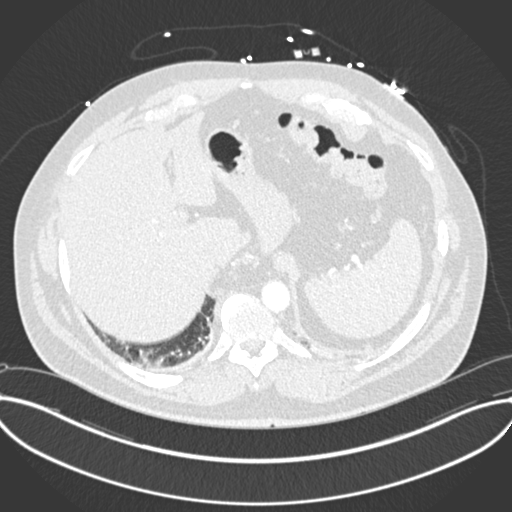
[im 64/270  soft-tissue]
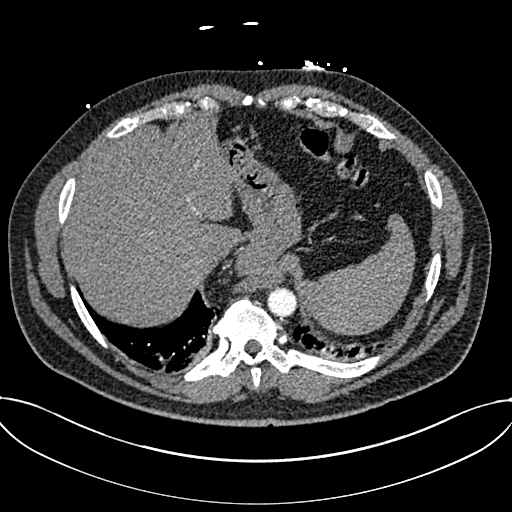
[im 80/270  lung]
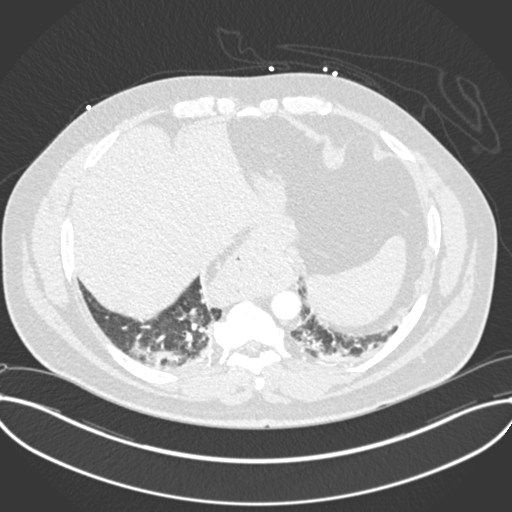
[im 95/270  soft-tissue]
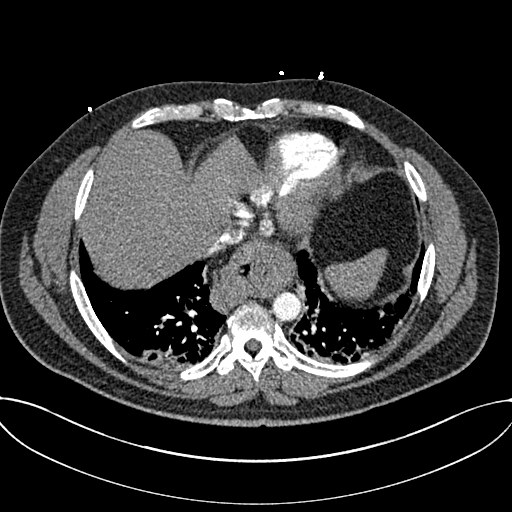
[im 111/270  lung]
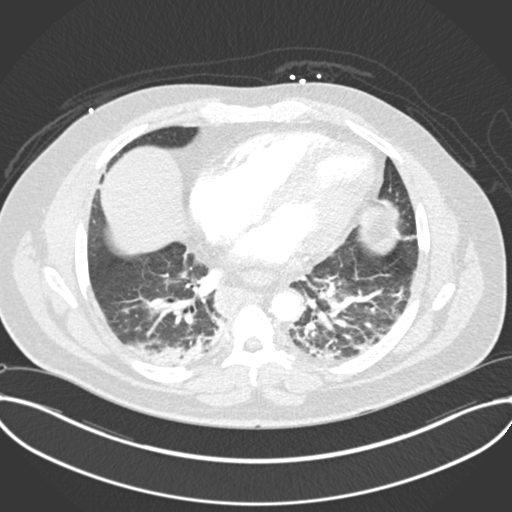
[im 143/270  soft-tissue]
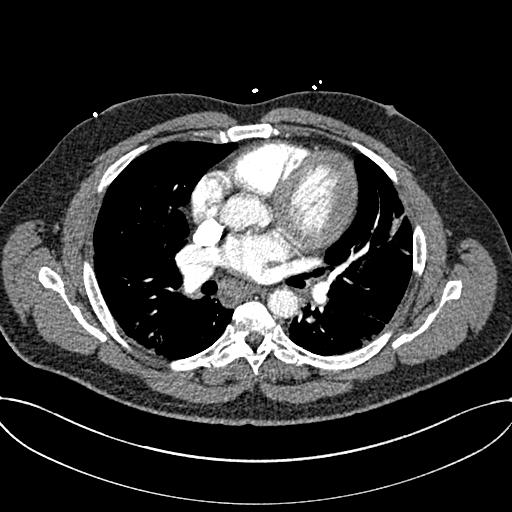
[im 159/270  lung]
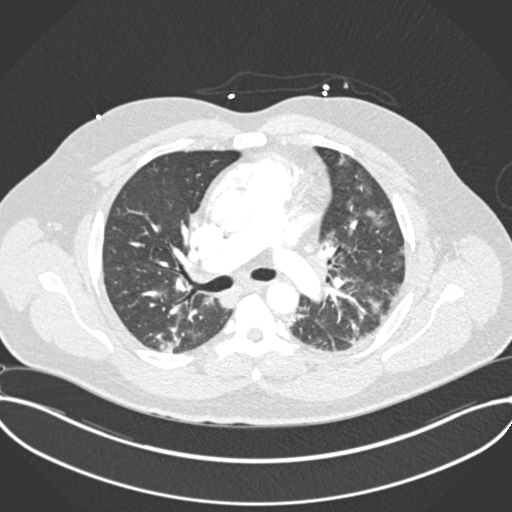
[im 175/270  soft-tissue]
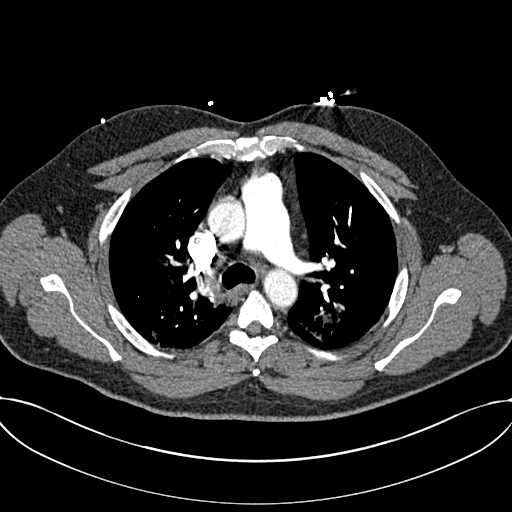
[im 190/270  lung]
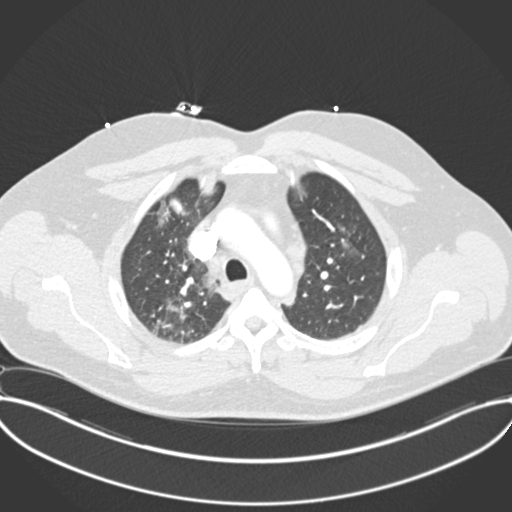
[im 206/270  soft-tissue]
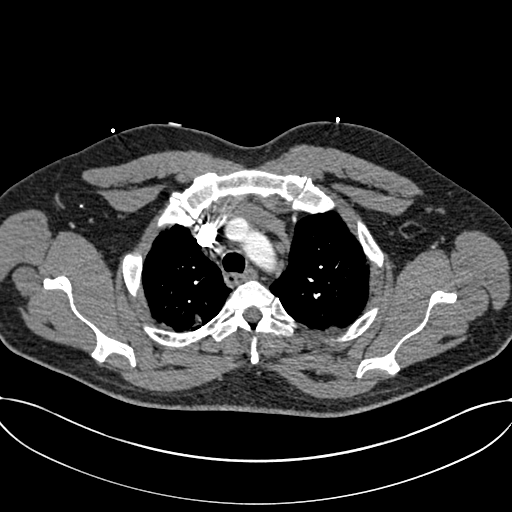
[im 222/270  lung]
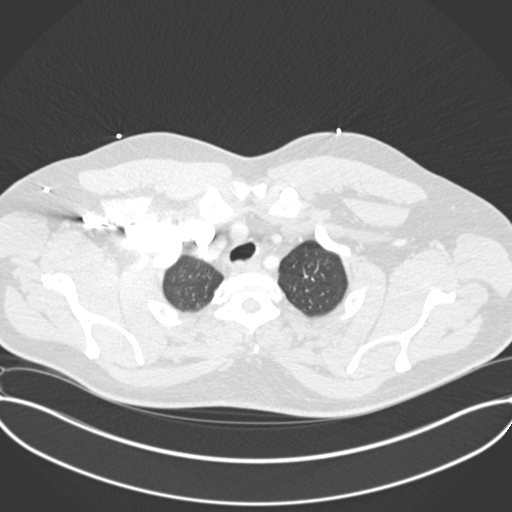
[im 238/270  soft-tissue]
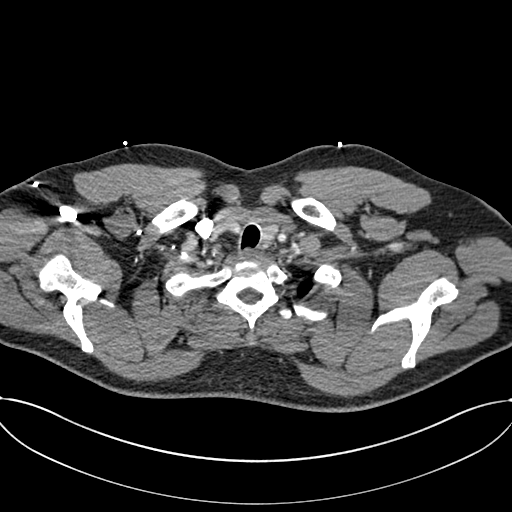
[im 254/270  lung]
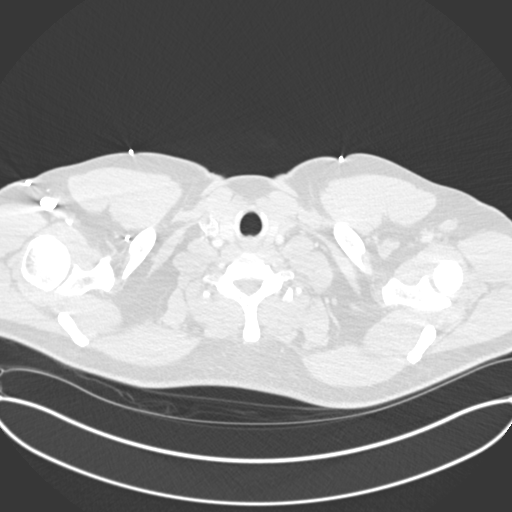

[Series 7: coronal mpr · coronal · 0.54mm/px · 3 of 168 slices shown]
[im 42/168  soft-tissue]
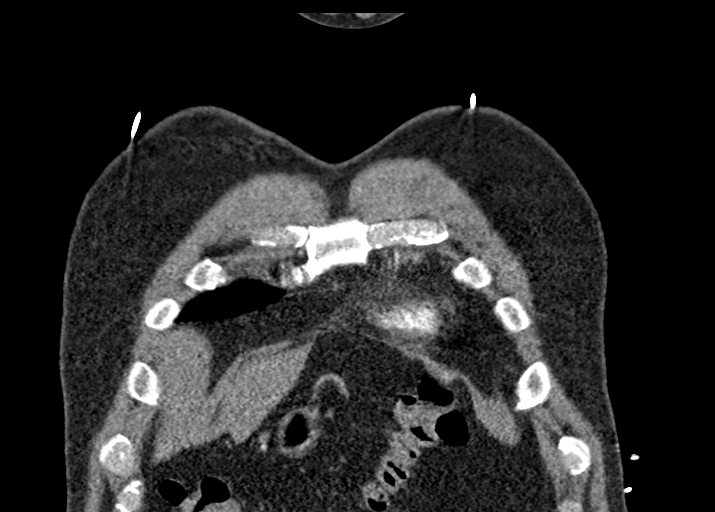
[im 84/168  soft-tissue]
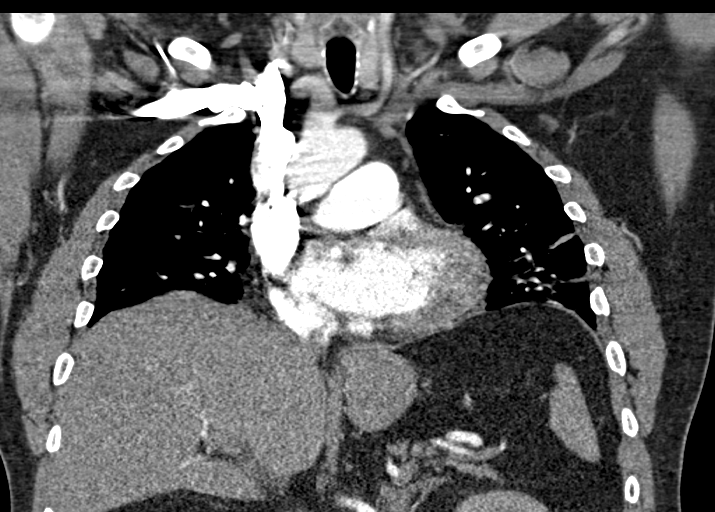
[im 126/168  soft-tissue]
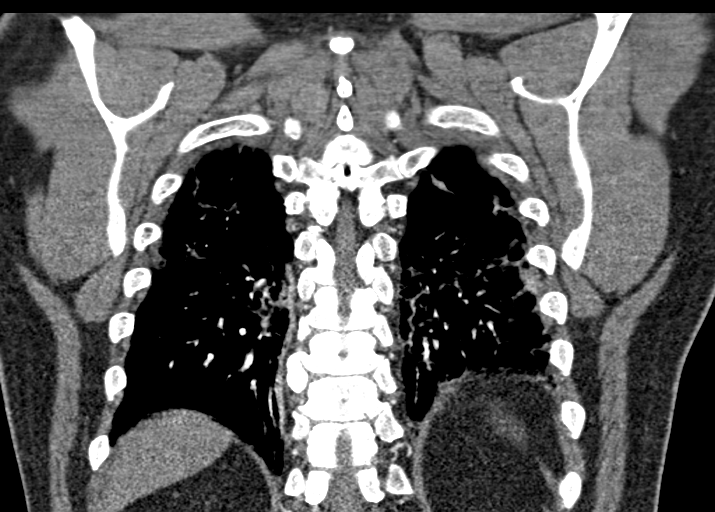

[18 of 46 positions shown; findings below may reference images not displayed]

FINDINGS: Cardiovascular: Satisfactory opacification of the pulmonary arteries
to the segmental level. No evidence of pulmonary embolism.
Cardiomegaly. No pericardial effusion.

Mediastinum/Nodes: No enlarged mediastinal, hilar, or axillary lymph
nodes. Hiatal hernia. Thyroid gland, trachea, and esophagus
demonstrate no significant findings.

Lungs/Pleura: There is extensive bilateral, predominantly subpleural
ground-glass and heterogeneous pulmonary opacity. There are stable,
benign small pulmonary nodules of the right upper and middle lobes
(series 11, image 62, 59). No pleural effusion or pneumothorax.

Upper Abdomen: No acute abnormality.

Musculoskeletal: No chest wall abnormality. No acute or significant
osseous findings.

Review of the MIP images confirms the above findings.
IMPRESSION: 1.  Negative examination for pulmonary embolism.

2. There is extensive bilateral, predominantly subpleural
ground-glass and heterogeneous pulmonary opacity. There are a
spectrum of findings in the lungs which can be seen with acute
atypical infection (as well as other non-infectious etiologies). In
particular, viral pneumonia (including [id number]) should be
considered in the appropriate clinical setting.

## 2021-05-07 ENCOUNTER — Emergency Department (HOSPITAL_COMMUNITY)
Admission: EM | Admit: 2021-05-07 | Discharge: 2021-05-07 | Disposition: A | Discharge status: Home / Self Care | Payer: Self-pay | Attending: Emergency Medicine | Admitting: Emergency Medicine

## 2021-05-07 ENCOUNTER — Emergency Department (HOSPITAL_COMMUNITY): Payer: Self-pay

## 2021-05-07 DIAGNOSIS — G8929 Other chronic pain: Secondary | ICD-10-CM | POA: Insufficient documentation

## 2021-05-07 DIAGNOSIS — E1122 Type 2 diabetes mellitus with diabetic chronic kidney disease: Secondary | ICD-10-CM | POA: Insufficient documentation

## 2021-05-07 DIAGNOSIS — R0989 Other specified symptoms and signs involving the circulatory and respiratory systems: Secondary | ICD-10-CM

## 2021-05-07 DIAGNOSIS — N183 Chronic kidney disease, stage 3 unspecified: Secondary | ICD-10-CM | POA: Insufficient documentation

## 2021-05-07 DIAGNOSIS — Z794 Long term (current) use of insulin: Secondary | ICD-10-CM | POA: Insufficient documentation

## 2021-05-07 DIAGNOSIS — Z7901 Long term (current) use of anticoagulants: Secondary | ICD-10-CM | POA: Insufficient documentation

## 2021-05-07 DIAGNOSIS — M545 Low back pain, unspecified: Secondary | ICD-10-CM

## 2021-05-07 DIAGNOSIS — Z20822 Contact with and (suspected) exposure to covid-19: Secondary | ICD-10-CM | POA: Insufficient documentation

## 2021-05-07 DIAGNOSIS — I129 Hypertensive chronic kidney disease with stage 1 through stage 4 chronic kidney disease, or unspecified chronic kidney disease: Secondary | ICD-10-CM | POA: Insufficient documentation

## 2021-05-07 DIAGNOSIS — Z8616 Personal history of COVID-19: Secondary | ICD-10-CM | POA: Insufficient documentation

## 2021-05-07 DIAGNOSIS — J811 Chronic pulmonary edema: Secondary | ICD-10-CM | POA: Insufficient documentation

## 2021-05-07 DIAGNOSIS — Z7984 Long term (current) use of oral hypoglycemic drugs: Secondary | ICD-10-CM | POA: Insufficient documentation

## 2021-05-07 DIAGNOSIS — Z79899 Other long term (current) drug therapy: Secondary | ICD-10-CM | POA: Insufficient documentation

## 2021-05-07 LAB — CBC WITH DIFFERENTIAL/PLATELET
Abs Immature Granulocytes: 0.02 10*3/uL (ref 0.00–0.07)
Basophils Absolute: 0 10*3/uL (ref 0.0–0.1)
Basophils Relative: 0 %
Eosinophils Absolute: 0 10*3/uL (ref 0.0–0.5)
Eosinophils Relative: 1 %
HCT: 40.5 % (ref 39.0–52.0)
Hemoglobin: 12.9 g/dL — ABNORMAL LOW (ref 13.0–17.0)
Immature Granulocytes: 0 %
Lymphocytes Relative: 37 %
Lymphs Abs: 2.6 10*3/uL (ref 0.7–4.0)
MCH: 25.3 pg — ABNORMAL LOW (ref 26.0–34.0)
MCHC: 31.9 g/dL (ref 30.0–36.0)
MCV: 79.6 fL — ABNORMAL LOW (ref 80.0–100.0)
Monocytes Absolute: 0.6 10*3/uL (ref 0.1–1.0)
Monocytes Relative: 8 %
Neutro Abs: 3.7 10*3/uL (ref 1.7–7.7)
Neutrophils Relative %: 54 %
Platelets: 238 10*3/uL (ref 150–400)
RBC: 5.09 MIL/uL (ref 4.22–5.81)
RDW: 18.3 % — ABNORMAL HIGH (ref 11.5–15.5)
WBC: 6.9 10*3/uL (ref 4.0–10.5)
nRBC: 0 % (ref 0.0–0.2)

## 2021-05-07 LAB — TROPONIN I (HIGH SENSITIVITY)
Troponin I (High Sensitivity): 18 ng/L — ABNORMAL HIGH (ref <18)
Troponin I (High Sensitivity): 16 ng/L (ref <18)

## 2021-05-07 LAB — COMPREHENSIVE METABOLIC PANEL
ALT: 21 U/L (ref 0–44)
AST: 28 U/L (ref 15–41)
Albumin: 3.7 g/dL (ref 3.5–5.0)
Alkaline Phosphatase: 89 U/L (ref 38–126)
Anion gap: 13 (ref 5–15)
BUN: 16 mg/dL (ref 6–20)
CO2: 18 mmol/L — ABNORMAL LOW (ref 22–32)
Calcium: 8.6 mg/dL — ABNORMAL LOW (ref 8.9–10.3)
Chloride: 105 mmol/L (ref 98–111)
Creatinine, Ser: 1.39 mg/dL — ABNORMAL HIGH (ref 0.61–1.24)
GFR, Estimated: 59 mL/min — ABNORMAL LOW (ref >60)
Glucose, Bld: 121 mg/dL — ABNORMAL HIGH (ref 70–99)
Potassium: 3.4 mmol/L — ABNORMAL LOW (ref 3.5–5.1)
Sodium: 136 mmol/L (ref 135–145)
Total Bilirubin: 0.9 mg/dL (ref 0.3–1.2)
Total Protein: 7.3 g/dL (ref 6.5–8.1)

## 2021-05-07 LAB — RESP PANEL BY RT-PCR (FLU A&B, COVID) ARPGX2
Influenza A by PCR: NEGATIVE
Influenza B by PCR: NEGATIVE
SARS Coronavirus 2 by RT PCR: NEGATIVE

## 2021-05-07 LAB — BRAIN NATRIURETIC PEPTIDE: B Natriuretic Peptide: 83.6 pg/mL (ref 0.0–100.0)

## 2021-05-07 MED ORDER — DIAZEPAM 5 MG PO TABS
5.0000 mg | ORAL_TABLET | Freq: Once | ORAL | Status: AC
  Administered 2021-05-07: 5 mg via ORAL
  Filled 2021-05-07: qty 1

## 2021-05-07 MED ORDER — HYDROCODONE-ACETAMINOPHEN 5-325 MG PO TABS
1.0000 | ORAL_TABLET | Freq: Once | ORAL | Status: AC
  Administered 2021-05-07: 1 via ORAL
  Filled 2021-05-07: qty 1

## 2021-05-07 NOTE — ED Triage Notes (Signed)
Pt reports SOB beginning yesterday. Denies fevers, cough. Also c/o right lower back pain radiating to legs.

## 2021-05-07 NOTE — ED Provider Notes (Signed)
[facility] EMERGENCY DEPARTMENT Provider Note   CSN: [id number] Arrival date & time: 05/07/21  4650     History Chief Complaint  Patient presents with  . Shortness of Breath  . Back Pain    Ryan [name] is a 60 y.o. male.  HPI     60 year old male comes in with chief complaint of shortness of breath, back pain. Patient has history of diabetes, CKD, atrial fibrillation on blood thinners and he reports intermittent use of cocaine.  Patient states that yesterday started having sudden onset back pain.  The pain is worse with any kind of movement or activity and he is having hard time moving around because of the pain.  He denies any focal weakness or numbness.  He has had back pain for several years now, every now and then it flares up but the current episode is more severe than usual.  He has not taken anything for pain  Pt has no associated numbness, weakness, urinary incontinence, urinary retention, bowel incontinence, pins and needle sensation in the perineal area.  Additionally patient reports that he has been having some shortness of breath.  Shortness of breath is exertional in nature.  He denies any new orthopnea, PND.  Review of system is negative for any acute chest pain, but in general patient has had intermittent episodes of left-sided chest pain.  With the current back pain, that is mostly lower lumbar spine, he does not have any associated abdominal or chest pain.  Patient denies any new leg swelling but states that he always has leg swelling and is taking Lasix for it.  Past Medical History:  Diagnosis Date  . Achilles tendon rupture right  . Arthritis    back and shoulders  . Atrial flutter with rapid ventricular response (HCC) 07/22/2020  . Chest pain    due to cocaine use  . Diabetes mellitus without complication (HCC)   . Drug abuse (HCC)    cocaine, marijuana abuse  . GERD (gastroesophageal reflux disease)   . Hypertension   .  Renal insufficiency     Patient Active Problem List   Diagnosis Date Noted  . Foot pain, right 09/26/2020  . Closed nondisplaced fracture of proximal phalanx of lesser toe of right foot 09/26/2020  . Esophagitis   . Midepigastric pain   . Atrial flutter with rapid ventricular response (HCC) 07/22/2020  . Cocaine abuse (HCC)   . COVID-19 virus infection 05/31/2019  . Orthostatic syncope 05/31/2019  . Diabetes mellitus type 2, insulin dependent (HCC) 05/31/2019  . Drug abuse (HCC)   . CKD (chronic kidney disease), stage III (HCC)   . Right ankle pain 05/15/2014  . Uncontrolled hypertension 05/14/2014  . Diabetes mellitus (HCC) 05/14/2014  . Rupture of right Achilles tendon 05/02/2014    Past Surgical History:  Procedure Laterality Date  . ACHILLES TENDON SURGERY Right 05/02/2014   Procedure: RIGHT ACHILLES TENDON REPAIR;  Surgeon: Javier Docker, MD;  Location: [facility] ORS;  Service: Orthopedics;  Laterality: Right;  . BIOPSY  07/25/2020   Procedure: BIOPSY;  Surgeon: Kathi Der, MD;  Location: [facility] ENDOSCOPY;  Service: Gastroenterology;;  . CARDIAC CATHETERIZATION  05.31.2012   showing patent coronaries with no visualized vasospasm and EF of 55-60 %  . ESOPHAGOGASTRODUODENOSCOPY N/A 07/25/2020   Procedure: ESOPHAGOGASTRODUODENOSCOPY (EGD);  Surgeon: Kathi Der, MD;  Location: [facility] ENDOSCOPY;  Service: Gastroenterology;  Laterality: N/A;  . ORBITAL FRACTURE SURGERY Left 2-3 yrs ago       Family  History  Problem Relation Age of Onset  . Unexplained death Mother 16       She died in 04-07-16, he does not remember what she died of  . Unexplained death Father        He has no information on his father    Social History   Tobacco Use  . Smoking status: Never Smoker  . Smokeless tobacco: Never Used  Vaping Use  . Vaping Use: Never used  Substance Use Topics  . Alcohol use: Yes    Alcohol/week: 3.0 standard drinks    Types: 3 Cans of beer per week    Comment: occassional   . Drug use: Yes    Types: Cocaine, Marijuana    Comment: last cocaine use 2 to 3 yrs ago, last marijuana use 4 to 5 years ago    Home Medications Prior to Admission medications   Medication Sig Start Date End Date Taking? Authorizing Provider  apixaban (ELIQUIS) 5 MG TABS tablet Take 1 tablet (5 mg total) by mouth 2 (two) times daily. 07/25/20  Yes Irene Limbo, Callie E, PA-C  COREG 3.125 MG tablet Take 3.125 mg by mouth 2 (two) times daily. 04/29/21  Yes [provider]  diltiazem (CARDIZEM CD) 240 MG 24 hr capsule Take 1 capsule (240 mg total) by mouth daily. 07/25/20  Yes Marjie Skiff E, PA-C  furosemide (LASIX) 40 MG tablet Take 1 tablet (40 mg total) by mouth daily. 08/11/20  Yes Irene Limbo, Callie E, PA-C  glipiZIDE (GLUCOTROL) 10 MG tablet Take 10 mg by mouth daily. 03/04/21  Yes [provider]  insulin NPH-regular Human (NOVOLIN 70/30) (70-30) 100 UNIT/ML injection Inject 35 units into the skin 2 times daily with a meal. 07/25/20  Yes Irene Limbo, Callie E, PA-C  losartan (COZAAR) 100 MG tablet Take 100 mg by mouth daily. 03/04/21  Yes [provider]  omeprazole (PRILOSEC) 40 MG capsule Take 40 mg by mouth daily. 01/07/21  Yes [provider]  potassium chloride SA (KLOR-CON) 20 MEQ tablet Take 1 tablet (20 mEq total) by mouth daily. 08/11/20  Yes Marjie Skiff E, PA-C  sucralfate (CARAFATE) 1 g tablet Take 1 tablet (1 g total) by mouth 2 (two) times daily. 07/25/20 10/23/20 Yes Marjie Skiff E, PA-C  ZYRTEC ALLERGY 10 MG tablet Take 10 mg by mouth at bedtime. 03/03/20  Yes [provider]  losartan (COZAAR) 50 MG tablet Take 1 tablet (50 mg total) by mouth daily. Patient not taking: No sig reported 08/11/20   Marjie Skiff E, PA-C  pantoprazole (PROTONIX) 40 MG tablet Take 1 tablet (40 mg total) by mouth 2 (two) times daily. Patient not taking: No sig reported 07/25/20   Corrin Parker, PA-C    Allergies    Patient has no known  allergies.  Review of Systems   Review of Systems  Constitutional: Positive for activity change.  Respiratory: Positive for shortness of breath.   Gastrointestinal: Negative for nausea and vomiting.  Musculoskeletal: Positive for back pain.  Allergic/Immunologic: Negative for immunocompromised state.  Neurological: Negative for numbness.  Hematological: Negative for adenopathy.  All other systems reviewed and are negative.   Physical Exam Updated Vital Signs BP (!) 147/93   Pulse 83   Temp 98.1 F (36.7 C) (Oral)   Resp (!) 21   Ht 5\' 9"  (1.753 m)   Wt 111.1 kg   SpO2 98%   BMI 36.18 kg/m   Physical Exam Vitals and nursing note reviewed.  Constitutional:  Appearance: He is well-developed.  HENT:     Head: Atraumatic.  Cardiovascular:     Rate and Rhythm: Normal rate.  Pulmonary:     Effort: Pulmonary effort is normal.     Breath sounds: No decreased breath sounds, wheezing, rhonchi or rales.  Musculoskeletal:     Cervical back: Neck supple.     Right lower leg: Edema present.     Left lower leg: Edema present.     Comments: Pt has tenderness over the lumbar region, and also paraspinal region bilaterally or the lower back.  Reproducible tenderness with manipulation of the spasms.  No step offs, no erythema. Pt has 2+ patellar reflex bilaterally. Able to discriminate between sharp and dull. Able to ambulate  Patient has 2+ dorsalis pedis and femoral pulses bilaterally   Skin:    General: Skin is warm.  Neurological:     Mental Status: He is alert and oriented to person, place, and time.     ED Results / Procedures / Treatments   Labs (all labs ordered are listed, but only abnormal results are displayed) Labs Reviewed  COMPREHENSIVE METABOLIC PANEL - Abnormal; Notable for the following components:      Result Value   Potassium 3.4 (*)    CO2 18 (*)    Glucose, Bld 121 (*)    Creatinine, Ser 1.39 (*)    Calcium 8.6 (*)    GFR, Estimated 59 (*)     All other components within normal limits  CBC WITH DIFFERENTIAL/PLATELET - Abnormal; Notable for the following components:   Hemoglobin 12.9 (*)    MCV 79.6 (*)    MCH 25.3 (*)    RDW 18.3 (*)    All other components within normal limits  TROPONIN I (HIGH SENSITIVITY) - Abnormal; Notable for the following components:   Troponin I (High Sensitivity) 18 (*)    All other components within normal limits  RESP PANEL BY RT-PCR (FLU A&B, COVID) ARPGX2  BRAIN NATRIURETIC PEPTIDE  URINALYSIS, ROUTINE W REFLEX MICROSCOPIC  TROPONIN I (HIGH SENSITIVITY)    EKG EKG Interpretation  Date/Time:  Thursday May 07 2021 08:15:53 EDT Ventricular Rate:  93 PR Interval:  180 QRS Duration: 82 QT Interval:  366 QTC Calculation: 455 R Axis:   37 Text Interpretation: Sinus rhythm with occasional Premature ventricular complexes and Premature atrial complexes Anterior infarct , age undetermined Abnormal ECG Nonspecific ST and T wave abnormality No significant change since last tracing Confirmed by Derwood Kaplananavati, Didi Ganaway [phone]([id number]) on 05/07/2021 8:47:45 AM   Radiology DG Chest 1 View  Result Date: 05/07/2021 CLINICAL DATA:  Shortness of breath EXAM: CHEST  1 VIEW COMPARISON:  08/07/2020 FINDINGS: Cardiac shadow is enlarged but stable. Aortic calcifications are noted. The overall inspiratory effort is poor with crowding of the vascular markings. No focal infiltrate or effusion is seen. Mild vascular prominence is noted which may represent some very early CHF. This is likely accentuated by the poor inspiratory effort. No bony abnormality is noted. IMPRESSION: Mild vascular prominence somewhat accentuated by the poor inspiratory effort. Early congestive failure cannot be excluded. Electronically Signed   By: Alcide CleverMark  Lukens M.D.   On: 05/07/2021 09:25    Procedures Procedures   Medications Ordered in ED Medications  HYDROcodone-acetaminophen (NORCO/VICODIN) 5-325 MG per tablet 1 tablet (1 tablet Oral Given 05/07/21 0958)   diazepam (VALIUM) tablet 5 mg (5 mg Oral Given 05/07/21 [id number])    ED Course  I have reviewed the triage vital signs and the nursing  notes.  Pertinent labs & imaging results that were available during my care of the patient were reviewed by me and considered in my medical decision making (see chart for details).    MDM Rules/Calculators/A&P                          60 year old male comes in with chief complaint of shortness of breath and back pain.  They appear to be 2 separate complaints.  Shortness of breath is exertional.  Patient has history of cocaine use.  There is no associated chest pain with it.  He has history of diastolic dysfunction most likely, he is on Lasix 40 mg, and there could be some volume overload.  Chest x-ray ordered along with BNP and troponin.  Suspicion for ACS is low.  Vascular exam is normal, no murmur, suspicion for dissection is low.  Back pain appears to be the primary complaint.  There is no abdominal pain, no palpable mass and as mentioned above, vascular exam is normal.  No red flags for deep space infection of the spine.  No red flags for cord compression.  1:54 PM Results of the ER was discussed with the patient.  We will give him some Lasix as there is some vascular congestion on x-ray.  Discussed with him the need for him to wear compression stockings during the daytime.  Also will advise him to follow-up with his PCP, he might benefit with tweaking of his Lasix.  We will give him double Lasix for 1 week.  Initial HST and is stable for patient.  Repeat pending.  Anticipate discharge if that is normal.  Patient's back pain is a lot better after oral Norco.  Final Clinical Impression(s) / ED Diagnoses Final diagnoses:  Pulmonary vascular congestion  Chronic bilateral low back pain without sciatica    Rx / DC Orders ED Discharge Orders    None       Derwood Kaplan, MD 05/07/21 1354

## 2021-05-07 NOTE — Discharge Instructions (Signed)
You are seen in the ER for shortness of breath and back pain.  The back pain is secondary to your chronic degenerative spine disease.  Take over-the-counter medication for optimal pain control. Complete strengthening exercises to improve core strength and reduce future events of severe pain like this.  For the shortness of breath, double your Lasix for the next 5 days only.  Return to the ER if you start having worsening back pain, fevers, chills, inability to move your extremities, new numbness in your legs.  Return to the ER if you start having worsening shortness of breath despite taking increased Lasix.  Follow-up with your primary care doctor in 5 to 7 days.

## 2021-05-07 NOTE — ED Notes (Signed)
Pt verbalizes understanding of d/c instructions. Pt ambulatory at d/c with all belongings.   

## 2021-07-25 ENCOUNTER — Emergency Department (HOSPITAL_COMMUNITY): Payer: Self-pay

## 2021-07-25 ENCOUNTER — Encounter (HOSPITAL_COMMUNITY): Payer: Self-pay | Admitting: Emergency Medicine

## 2021-07-25 ENCOUNTER — Emergency Department (HOSPITAL_COMMUNITY)
Admission: EM | Admit: 2021-07-25 | Discharge: 2021-07-26 | Disposition: A | Discharge status: Home / Self Care | Payer: Self-pay | Attending: Emergency Medicine | Admitting: Emergency Medicine

## 2021-07-25 ENCOUNTER — Other Ambulatory Visit: Payer: Self-pay

## 2021-07-25 DIAGNOSIS — Z7901 Long term (current) use of anticoagulants: Secondary | ICD-10-CM | POA: Insufficient documentation

## 2021-07-25 DIAGNOSIS — R739 Hyperglycemia, unspecified: Secondary | ICD-10-CM

## 2021-07-25 DIAGNOSIS — Z7984 Long term (current) use of oral hypoglycemic drugs: Secondary | ICD-10-CM | POA: Insufficient documentation

## 2021-07-25 DIAGNOSIS — E1165 Type 2 diabetes mellitus with hyperglycemia: Secondary | ICD-10-CM | POA: Insufficient documentation

## 2021-07-25 DIAGNOSIS — K429 Umbilical hernia without obstruction or gangrene: Secondary | ICD-10-CM | POA: Insufficient documentation

## 2021-07-25 DIAGNOSIS — N183 Chronic kidney disease, stage 3 unspecified: Secondary | ICD-10-CM | POA: Insufficient documentation

## 2021-07-25 DIAGNOSIS — Z8616 Personal history of COVID-19: Secondary | ICD-10-CM | POA: Insufficient documentation

## 2021-07-25 DIAGNOSIS — R0602 Shortness of breath: Secondary | ICD-10-CM

## 2021-07-25 DIAGNOSIS — I129 Hypertensive chronic kidney disease with stage 1 through stage 4 chronic kidney disease, or unspecified chronic kidney disease: Secondary | ICD-10-CM | POA: Insufficient documentation

## 2021-07-25 DIAGNOSIS — R600 Localized edema: Secondary | ICD-10-CM | POA: Insufficient documentation

## 2021-07-25 DIAGNOSIS — Z794 Long term (current) use of insulin: Secondary | ICD-10-CM | POA: Insufficient documentation

## 2021-07-25 DIAGNOSIS — Z79899 Other long term (current) drug therapy: Secondary | ICD-10-CM | POA: Insufficient documentation

## 2021-07-25 LAB — URINALYSIS, ROUTINE W REFLEX MICROSCOPIC
Bacteria, UA: NONE SEEN
Bilirubin Urine: NEGATIVE
Glucose, UA: >=500 mg/dL — AB
Hgb urine dipstick: NEGATIVE
Ketones, ur: NEGATIVE mg/dL
Leukocytes,Ua: NEGATIVE
Nitrite: NEGATIVE
Protein, ur: NEGATIVE mg/dL
Specific Gravity, Urine: 1.014 (ref 1.005–1.030)
pH: 6 (ref 5.0–8.0)

## 2021-07-25 LAB — CBC
HCT: 38.7 % — ABNORMAL LOW (ref 39.0–52.0)
Hemoglobin: 12.2 g/dL — ABNORMAL LOW (ref 13.0–17.0)
MCH: 25.8 pg — ABNORMAL LOW (ref 26.0–34.0)
MCHC: 31.5 g/dL (ref 30.0–36.0)
MCV: 82 fL (ref 80.0–100.0)
Platelets: 179 10*3/uL (ref 150–400)
RBC: 4.72 MIL/uL (ref 4.22–5.81)
RDW: 17.1 % — ABNORMAL HIGH (ref 11.5–15.5)
WBC: 5.9 10*3/uL (ref 4.0–10.5)
nRBC: 0 % (ref 0.0–0.2)

## 2021-07-25 LAB — BASIC METABOLIC PANEL
Anion gap: 11 (ref 5–15)
BUN: 21 mg/dL — ABNORMAL HIGH (ref 6–20)
CO2: 20 mmol/L — ABNORMAL LOW (ref 22–32)
Calcium: 8.5 mg/dL — ABNORMAL LOW (ref 8.9–10.3)
Chloride: 103 mmol/L (ref 98–111)
Creatinine, Ser: 1.82 mg/dL — ABNORMAL HIGH (ref 0.61–1.24)
GFR, Estimated: 43 mL/min — ABNORMAL LOW (ref >60)
Glucose, Bld: 344 mg/dL — ABNORMAL HIGH (ref 70–99)
Potassium: 3.8 mmol/L (ref 3.5–5.1)
Sodium: 134 mmol/L — ABNORMAL LOW (ref 135–145)

## 2021-07-25 LAB — TROPONIN I (HIGH SENSITIVITY): Troponin I (High Sensitivity): 14 ng/L (ref <18)

## 2021-07-25 NOTE — ED Triage Notes (Signed)
Pt here from work with c/o sob and lightheaded while working , no loc

## 2021-07-26 ENCOUNTER — Emergency Department (HOSPITAL_COMMUNITY): Payer: Self-pay

## 2021-07-26 LAB — CBG MONITORING, ED: Glucose-Capillary: 217 mg/dL — ABNORMAL HIGH (ref 70–99)

## 2021-07-26 LAB — TROPONIN I (HIGH SENSITIVITY): Troponin I (High Sensitivity): 15 ng/L (ref <18)

## 2021-07-26 MED ORDER — IOHEXOL 350 MG/ML SOLN
80.0000 mL | Freq: Once | INTRAVENOUS | Status: AC | PRN
  Administered 2021-07-26: 80 mL via INTRAVENOUS

## 2021-07-26 NOTE — ED Provider Notes (Signed)
[facility] EMERGENCY DEPARTMENT Provider Note   CSN: [id number] Arrival date & time: 07/25/21  Mar 31, 2104     History No chief complaint on file.   Ryan [name] is a 60 y.o. male with PMHx of T2DM, HTN, CKD, atrial flutter with RVR on Eliquis, cocaine abuse, GERD, who presents to [facility] ED with SOB and lightheadedness. He reports at [facility] last night he was at [place] when he began to feel shaky, lightheaded with SOB, describes having deep respirations. He denies fever, chest pain, nausea, vomiting, syncope, dysuria. He still feels lightheaded and reports still having SOB. He takes insulin at home but has difficulty affording his insulin. He last took insulin yesterday morning but did not take his evening dose and has missed doses this week. He rarely checks his blood sugars. He reports 10/10 abdominal pain from known hernia. The pain has gradually worsened the last few days. No aggravating or alleviating factors. Patient follows with [facility] and has tried to see surgery for hernia evaluation but was unable to see surgery likely due to insurance coverage. Patient also endorsing LLE present for several weeks. He takes water pill but patient is unsure why he takes the medication. Patient has gained 20lbs in one month and is unsure how or why.   HPI     Past Medical History:  Diagnosis Date   Achilles tendon rupture right   Arthritis    back and shoulders   Atrial flutter with rapid ventricular response (HCC) 07/22/2020   Chest pain    due to cocaine use   Diabetes mellitus without complication (HCC)    Drug abuse (HCC)    cocaine, marijuana abuse   GERD (gastroesophageal reflux disease)    Hypertension    Renal insufficiency     Patient Active Problem List   Diagnosis Date Noted   Foot pain, right 09/26/2020   Closed nondisplaced fracture of proximal phalanx of lesser toe of right foot 09/26/2020   Esophagitis    Midepigastric pain     Atrial flutter with rapid ventricular response (HCC) 07/22/2020   Cocaine abuse (HCC)    COVID-19 virus infection 05/31/2019   Orthostatic syncope 05/31/2019   Diabetes mellitus type 2, insulin dependent (HCC) 05/31/2019   Drug abuse (HCC)    CKD (chronic kidney disease), stage III (HCC)    Right ankle pain 05/15/2014   Uncontrolled hypertension 05/14/2014   Diabetes mellitus (HCC) 05/14/2014   Rupture of right Achilles tendon 05/02/2014    Past Surgical History:  Procedure Laterality Date   ACHILLES TENDON SURGERY Right 05/02/2014   Procedure: RIGHT ACHILLES TENDON REPAIR;  Surgeon: Javier Docker, MD;  Location: [facility] ORS;  Service: Orthopedics;  Laterality: Right;   BIOPSY  07/25/2020   Procedure: BIOPSY;  Surgeon: Kathi Der, MD;  Location: [facility] ENDOSCOPY;  Service: Gastroenterology;;   CARDIAC CATHETERIZATION  05.31.2012   showing patent coronaries with no visualized vasospasm and EF of 55-60 %   ESOPHAGOGASTRODUODENOSCOPY N/A 07/25/2020   Procedure: ESOPHAGOGASTRODUODENOSCOPY (EGD);  Surgeon: Kathi Der, MD;  Location: [facility] ENDOSCOPY;  Service: Gastroenterology;  Laterality: N/A;   ORBITAL FRACTURE SURGERY Left 2-3 yrs ago       Family History  Problem Relation Age of Onset   Unexplained death Mother 19       She died in 03/31/2016, he does not remember what she died of   Unexplained death Father        He has no information on  his father    Social History   Tobacco Use   Smoking status: Never   Smokeless tobacco: Never  Vaping Use   Vaping Use: Never used  Substance Use Topics   Alcohol use: Yes    Alcohol/week: 3.0 standard drinks    Types: 3 Cans of beer per week    Comment: occassional   Drug use: Yes    Types: Cocaine, Marijuana    Comment: last cocaine use 2 to 3 yrs ago, last marijuana use 4 to 5 years ago    Home Medications Prior to Admission medications   Medication Sig Start Date End Date Taking? Authorizing Provider  apixaban (ELIQUIS) 5 MG  TABS tablet Take 1 tablet (5 mg total) by mouth 2 (two) times daily. 07/25/20   Marjie Skiff E, PA-C  COREG 3.125 MG tablet Take 3.125 mg by mouth 2 (two) times daily. 04/29/21   [provider]  diltiazem (CARDIZEM CD) 240 MG 24 hr capsule Take 1 capsule (240 mg total) by mouth daily. 07/25/20   Marjie Skiff E, PA-C  furosemide (LASIX) 40 MG tablet Take 1 tablet (40 mg total) by mouth daily. 08/11/20   Marjie Skiff E, PA-C  glipiZIDE (GLUCOTROL) 10 MG tablet Take 10 mg by mouth daily. 03/04/21   [provider]  insulin NPH-regular Human (NOVOLIN 70/30) (70-30) 100 UNIT/ML injection Inject 35 units into the skin 2 times daily with a meal. 07/25/20   Marjie Skiff E, PA-C  losartan (COZAAR) 100 MG tablet Take 100 mg by mouth daily. 03/04/21   [provider]  losartan (COZAAR) 50 MG tablet Take 1 tablet (50 mg total) by mouth daily. Patient not taking: No sig reported 08/11/20   Corrin Parker, PA-C  omeprazole (PRILOSEC) 40 MG capsule Take 40 mg by mouth daily. 01/07/21   [provider]  pantoprazole (PROTONIX) 40 MG tablet Take 1 tablet (40 mg total) by mouth 2 (two) times daily. Patient not taking: No sig reported 07/25/20   Marjie Skiff E, PA-C  potassium chloride SA (KLOR-CON) 20 MEQ tablet Take 1 tablet (20 mEq total) by mouth daily. 08/11/20   Marjie Skiff E, PA-C  sucralfate (CARAFATE) 1 g tablet Take 1 tablet (1 g total) by mouth 2 (two) times daily. 07/25/20 10/23/20  Corrin Parker, PA-C  ZYRTEC ALLERGY 10 MG tablet Take 10 mg by mouth at bedtime. 03/03/20   [provider]    Allergies    Patient has no known allergies.  Review of Systems   Review of Systems  Constitutional:  Positive for unexpected weight change. Negative for fever.  HENT:  Negative for congestion, rhinorrhea and sore throat.   Respiratory:  Positive for shortness of breath. Negative for cough.   Cardiovascular:  Positive for leg swelling. Negative  for chest pain and palpitations.  Gastrointestinal:  Positive for abdominal pain. Negative for constipation, diarrhea, nausea and vomiting.  Genitourinary:  Negative for dysuria and frequency.  Neurological:  Positive for tremors and light-headedness.   Physical Exam Updated Vital Signs BP (!) 152/97   Pulse 64   Temp 97.9 F (36.6 C) (Oral)   Resp 20   Ht  (1.753 m)   Wt 110.2 kg   SpO2 100%   BMI 35.88 kg/m   Physical Exam Constitutional:      General: He is not in acute distress.    Appearance: Normal appearance. He is obese. He is not ill-appearing or diaphoretic.  HENT:  Head: Normocephalic and atraumatic.     Mouth/Throat:     Mouth: Mucous membranes are moist.     Pharynx: Oropharynx is clear.  Eyes:     Extraocular Movements: Extraocular movements intact.     Conjunctiva/sclera: Conjunctivae normal.  Cardiovascular:     Rate and Rhythm: Normal rate and regular rhythm.     Pulses: Normal pulses.     Heart sounds: No murmur heard. Pulmonary:     Effort: Pulmonary effort is normal. No respiratory distress.     Breath sounds: Normal breath sounds. No wheezing, rhonchi or rales.  Abdominal:     General: Abdomen is flat. Bowel sounds are normal. There is no distension.     Palpations: Abdomen is soft. There is no mass.     Tenderness: There is abdominal tenderness. There is guarding.     Hernia: A hernia (periumbilical hernia, reducible) is present.  Musculoskeletal:     Right lower leg: Edema (2+ pitting edema) present.     Left lower leg: Edema (2+ pitting edema) present.  Skin:    General: Skin is warm and dry.  Neurological:     Mental Status: He is alert and oriented to person, place, and time. Mental status is at baseline.  Psychiatric:        Mood and Affect: Mood normal.        Behavior: Behavior normal.    ED Results / Procedures / Treatments   Labs (all labs ordered are listed, but only abnormal results are displayed) Labs Reviewed  BASIC  METABOLIC PANEL - Abnormal; Notable for the following components:      Result Value   Sodium 134 (*)    CO2 20 (*)    Glucose, Bld 344 (*)    BUN 21 (*)    Creatinine, Ser 1.82 (*)    Calcium 8.5 (*)    GFR, Estimated 43 (*)    All other components within normal limits  CBC - Abnormal; Notable for the following components:   Hemoglobin 12.2 (*)    HCT 38.7 (*)    MCH 25.8 (*)    RDW 17.1 (*)    All other components within normal limits  URINALYSIS, ROUTINE W REFLEX MICROSCOPIC - Abnormal; Notable for the following components:   Color, Urine COLORLESS (*)    Glucose, UA >=500 (*)    All other components within normal limits  CBG MONITORING, ED - Abnormal; Notable for the following components:   Glucose-Capillary 217 (*)    All other components within normal limits  TROPONIN I (HIGH SENSITIVITY)  TROPONIN I (HIGH SENSITIVITY)    EKG EKG Interpretation  Date/Time:  Saturday July 25 2021 21:00:41 EDT Ventricular Rate:  80 PR Interval:  186 QRS Duration: 90 QT Interval:  374 QTC Calculation: 431 R Axis:   111 Text Interpretation: Sinus rhythm with occasional Premature ventricular complexes Right axis deviation Possible Anterior infarct , age undetermined ST & T wave abnormality, consider inferolateral ischemia Abnormal ECG Similar ST abnormality V6, V5 more exaggerated, aVF, III, aVL similar but more exaggerated Confirmed by Alvira MondaySchlossman, Erin ([id number]) on 07/26/2021 9:18:38 AM  Radiology DG Chest 2 View  Result Date: 07/25/2021 CLINICAL DATA:  Shortness of breath EXAM: CHEST - 2 VIEW COMPARISON:  05/07/2021 FINDINGS: Cardiac shadow is stable in appearance. The overall inspiratory effort is poor. No focal infiltrate or effusion is seen. No bony abnormality is noted. IMPRESSION: Poor inspiratory effort without acute abnormality. Electronically Signed   By: Alcide CleverMark  Lukens  M.D.   On: 07/25/2021 21:32   CT Angio Chest PE W and/or Wo Contrast  Result Date: 07/26/2021 CLINICAL DATA:   Chest pain, short of breath. Concern for pulmonary embolism. Complicated hernia. EXAM: CT ANGIOGRAPHY CHEST CT ABDOMEN AND PELVIS WITH CONTRAST TECHNIQUE: Multidetector CT imaging of the chest was performed using the standard protocol during bolus administration of intravenous contrast. Multiplanar CT image reconstructions and MIPs were obtained to evaluate the vascular anatomy. Multidetector CT imaging of the abdomen and pelvis was performed using the standard protocol during bolus administration of intravenous contrast. CONTRAST:  53mL OMNIPAQUE IOHEXOL 350 MG/ML SOLN COMPARISON:  CT 07/24/2020 FINDINGS: CTA CHEST FINDINGS Cardiovascular: No filling defects within the pulmonary arteries to suggest acute pulmonary embolism. Mediastinum/Nodes: Moderate size hiatal hernia extends posterior to the LEFT ventricle. Small volume fluid remains within the esophagus similar to prior. No obstructing mass. No mediastinal adenopathy Lungs/Pleura: Mild basilar atelectasis. No pneumonia. Airways normal. Within the RIGHT upper lobe 5 mm nodule on image 42 series 7 is unchanged from prior. Musculoskeletal: No chest wall abnormality. No acute or significant osseous findings. Review of the MIP images confirms the above findings. CT ABDOMEN and PELVIS FINDINGS Hepatobiliary: No focal hepatic lesion. No biliary duct dilatation. Common bile duct is normal. Pancreas: Pancreas is normal. No ductal dilatation. No pancreatic inflammation. Spleen: Normal spleen Adrenals/urinary tract: Adrenal glands and kidneys are normal. The ureters and bladder normal. Stomach/Bowel: Hiatal hernia. Stomach, duodenum small-bowel normal. Appendix normal. Multiple diverticula of the descending colon and sigmoid colon without acute inflammation. Vascular/Lymphatic: Abdominal aorta is normal caliber. No periportal or retroperitoneal adenopathy. No pelvic adenopathy. Reproductive: Prostate unremarkable Other: No free fluid. Musculoskeletal: No aggressive  osseous lesion. IMPRESSION: Chest Impression: 1. No evidence acute pulmonary embolism. 2. Moderate size hiatal hernia similar comparison exam. 3. Probable gastroesophageal reflux. Abdomen / Pelvis Impression: 1. No acute findings in the abdomen pelvis. 2. Normal appendix. Electronically Signed   By: Genevive Bi M.D.   On: 07/26/2021 11:22   CT ABDOMEN PELVIS W CONTRAST  Result Date: 07/26/2021 CLINICAL DATA:  Chest pain, short of breath. Concern for pulmonary embolism. Complicated hernia. EXAM: CT ANGIOGRAPHY CHEST CT ABDOMEN AND PELVIS WITH CONTRAST TECHNIQUE: Multidetector CT imaging of the chest was performed using the standard protocol during bolus administration of intravenous contrast. Multiplanar CT image reconstructions and MIPs were obtained to evaluate the vascular anatomy. Multidetector CT imaging of the abdomen and pelvis was performed using the standard protocol during bolus administration of intravenous contrast. CONTRAST:  52mL OMNIPAQUE IOHEXOL 350 MG/ML SOLN COMPARISON:  CT 07/24/2020 FINDINGS: CTA CHEST FINDINGS Cardiovascular: No filling defects within the pulmonary arteries to suggest acute pulmonary embolism. Mediastinum/Nodes: Moderate size hiatal hernia extends posterior to the LEFT ventricle. Small volume fluid remains within the esophagus similar to prior. No obstructing mass. No mediastinal adenopathy Lungs/Pleura: Mild basilar atelectasis. No pneumonia. Airways normal. Within the RIGHT upper lobe 5 mm nodule on image 42 series 7 is unchanged from prior. Musculoskeletal: No chest wall abnormality. No acute or significant osseous findings. Review of the MIP images confirms the above findings. CT ABDOMEN and PELVIS FINDINGS Hepatobiliary: No focal hepatic lesion. No biliary duct dilatation. Common bile duct is normal. Pancreas: Pancreas is normal. No ductal dilatation. No pancreatic inflammation. Spleen: Normal spleen Adrenals/urinary tract: Adrenal glands and kidneys are normal. The  ureters and bladder normal. Stomach/Bowel: Hiatal hernia. Stomach, duodenum small-bowel normal. Appendix normal. Multiple diverticula of the descending colon and sigmoid colon without acute inflammation. Vascular/Lymphatic: Abdominal aorta is  normal caliber. No periportal or retroperitoneal adenopathy. No pelvic adenopathy. Reproductive: Prostate unremarkable Other: No free fluid. Musculoskeletal: No aggressive osseous lesion. IMPRESSION: Chest Impression: 1. No evidence acute pulmonary embolism. 2. Moderate size hiatal hernia similar comparison exam. 3. Probable gastroesophageal reflux. Abdomen / Pelvis Impression: 1. No acute findings in the abdomen pelvis. 2. Normal appendix. Electronically Signed   By: Genevive Bi M.D.   On: 07/26/2021 11:22    Procedures Procedures No procedures performed this visit.  Medications Ordered in ED Medications  iohexol (OMNIPAQUE) 350 MG/ML injection 80 mL (80 mLs Intravenous Contrast Given 07/26/21 1058)    ED Course  I have reviewed the triage vital signs and the nursing notes.  Pertinent labs & imaging results that were available during my care of the patient were reviewed by me and considered in my medical decision making (see chart for details).    MDM Rules/Calculators/A&P                         Kimm Ungaro is a 60 y.o. male with PMHx of T2DM, HTN, CKD, atrial flutter with RVR on Eliquis, cocaine abuse, GERD, who presents to [facility] ED with SOB, lightheadedness, found to be hyperglycemic with BG 400s enroute to ED. Patient is afebrile and HDS on arrival. CBC without leukocytosis with mild anemia, BMP with bicarb 20, without elevated anion gap, with glucose 344, BUN 21 and Cr 1.82. UA without ketones or signs of infectious process. CXR without acute abnormalities. Troponins normal. EKG NSR without ST elevations, possible inferolateral ischemia. Likely patient's symptoms related to hyperglycemia in the setting of medication non adherence,  though patient's blood glucose has come down this morning to 217 and patient still having reported SOB, not in respiratory distress, not hypoxic. Patient with SOB that started acutely without chest pain, 4PEPS 1 and Wells Score 0 though to rule out PE given persistent SOB without acute findings on CXR, will order CTA chest to rule out PE as well as CT ABD PEL to evaluate reported hernia. CTA without evidence of PE, evidence of hiatal hernia unchanged from prior. CT ABD PEL without acute findings, no evidence of hernia on imaging. Given patient's history of arrhythmia could be possible that patient had episode of arrhythmia while at work yesterday which spontaneously resolved prior to arrival to ED which caused his symptoms versus symptomatic hyperglycemia in the setting of insulin non adherence. Patient has been in NSR in the ED and blood sugars have come down to 217. Patient is stable for discharge to home. He was encouraged to follow up with [facility] and primary care provider.     Final Clinical Impression(s) / ED Diagnoses Final diagnoses:  Hyperglycemia  Shortness of breath    Rx / DC Orders ED Discharge Orders     None        Ellison Carwin, MD 07/26/21 1140    Alvira Monday, MD 07/27/21 1030

## 2021-07-27 LAB — CBG MONITORING, ED: Glucose-Capillary: 354 mg/dL — ABNORMAL HIGH (ref 70–99)

## 2021-10-28 ENCOUNTER — Emergency Department (HOSPITAL_COMMUNITY)
Admission: EM | Admit: 2021-10-28 | Discharge: 2021-10-28 | Disposition: A | Discharge status: Home / Self Care | Payer: Self-pay | Attending: Emergency Medicine | Admitting: Emergency Medicine

## 2021-10-28 ENCOUNTER — Emergency Department (HOSPITAL_COMMUNITY): Payer: Self-pay

## 2021-10-28 ENCOUNTER — Other Ambulatory Visit: Payer: Self-pay

## 2021-10-28 DIAGNOSIS — Z794 Long term (current) use of insulin: Secondary | ICD-10-CM | POA: Insufficient documentation

## 2021-10-28 DIAGNOSIS — Z79899 Other long term (current) drug therapy: Secondary | ICD-10-CM | POA: Insufficient documentation

## 2021-10-28 DIAGNOSIS — I480 Paroxysmal atrial fibrillation: Secondary | ICD-10-CM | POA: Insufficient documentation

## 2021-10-28 DIAGNOSIS — N289 Disorder of kidney and ureter, unspecified: Secondary | ICD-10-CM | POA: Insufficient documentation

## 2021-10-28 DIAGNOSIS — R0789 Other chest pain: Secondary | ICD-10-CM

## 2021-10-28 DIAGNOSIS — Z7984 Long term (current) use of oral hypoglycemic drugs: Secondary | ICD-10-CM | POA: Insufficient documentation

## 2021-10-28 DIAGNOSIS — N183 Chronic kidney disease, stage 3 unspecified: Secondary | ICD-10-CM | POA: Insufficient documentation

## 2021-10-28 DIAGNOSIS — E1122 Type 2 diabetes mellitus with diabetic chronic kidney disease: Secondary | ICD-10-CM | POA: Insufficient documentation

## 2021-10-28 DIAGNOSIS — I129 Hypertensive chronic kidney disease with stage 1 through stage 4 chronic kidney disease, or unspecified chronic kidney disease: Secondary | ICD-10-CM | POA: Insufficient documentation

## 2021-10-28 DIAGNOSIS — Z8616 Personal history of COVID-19: Secondary | ICD-10-CM | POA: Insufficient documentation

## 2021-10-28 LAB — BASIC METABOLIC PANEL
Anion gap: 11 (ref 5–15)
BUN: 21 mg/dL — ABNORMAL HIGH (ref 6–20)
CO2: 22 mmol/L (ref 22–32)
Calcium: 9 mg/dL (ref 8.9–10.3)
Chloride: 104 mmol/L (ref 98–111)
Creatinine, Ser: 2.07 mg/dL — ABNORMAL HIGH (ref 0.61–1.24)
GFR, Estimated: 36 mL/min — ABNORMAL LOW (ref >60)
Glucose, Bld: 193 mg/dL — ABNORMAL HIGH (ref 70–99)
Potassium: 3.6 mmol/L (ref 3.5–5.1)
Sodium: 137 mmol/L (ref 135–145)

## 2021-10-28 LAB — CBC
HCT: 44.3 % (ref 39.0–52.0)
Hemoglobin: 14.3 g/dL (ref 13.0–17.0)
MCH: 26.1 pg (ref 26.0–34.0)
MCHC: 32.3 g/dL (ref 30.0–36.0)
MCV: 81 fL (ref 80.0–100.0)
Platelets: 273 10*3/uL (ref 150–400)
RBC: 5.47 MIL/uL (ref 4.22–5.81)
RDW: 16.9 % — ABNORMAL HIGH (ref 11.5–15.5)
WBC: 9.8 10*3/uL (ref 4.0–10.5)
nRBC: 0 % (ref 0.0–0.2)

## 2021-10-28 LAB — TROPONIN I (HIGH SENSITIVITY)
Troponin I (High Sensitivity): 19 ng/L — ABNORMAL HIGH (ref <18)
Troponin I (High Sensitivity): 17 ng/L (ref <18)

## 2021-10-28 LAB — BRAIN NATRIURETIC PEPTIDE: B Natriuretic Peptide: 352 pg/mL — ABNORMAL HIGH (ref 0.0–100.0)

## 2021-10-28 MED ORDER — FUROSEMIDE 40 MG PO TABS: 40.0000 mg | ORAL_TABLET | Freq: Two times a day (BID) | ORAL | 0 refills | Status: DC

## 2021-10-28 MED ORDER — FUROSEMIDE 10 MG/ML IJ SOLN
40.0000 mg | Freq: Once | INTRAMUSCULAR | Status: AC
  Administered 2021-10-28: 40 mg via INTRAVENOUS
  Filled 2021-10-28: qty 4

## 2021-10-28 NOTE — Discharge Instructions (Signed)
As discussed, today's evaluation demonstrates that your body is retaining fluid, and your kidney function is slightly worse than it has been.  Given these considerations it is very important that you follow-up in the cardiology clinic.  A referral has been made and you should be contacted tomorrow to expedite follow-up.  If you do not receive a phone call, please use the provided information to arrange close follow-up.  Return here for concerning changes in your condition. In the interim, your Lasix dosing is changing.  Please take 40 mg twice daily until you have a chance to speak with your physician about modifications as needed to your medication regimen.

## 2021-10-28 NOTE — ED Notes (Signed)
Pt given sandwich bag and soda per request. No acute changes noted. Will continue to monitor.

## 2021-10-28 NOTE — ED Triage Notes (Signed)
Pt with L sided aching chest pain since 1230 this afternoon. Pt having intermittent chest pain like this x 1 week. 324 ASA and 2 nitro given by EMS without change in pain. Reports having a "heart problem" but does not know what it is.

## 2021-10-28 NOTE — ED Provider Notes (Signed)
Emergency Medicine Provider Triage Evaluation Note  Roi Jafari , a 60 y.o. male  was evaluated in triage.  Pt complains of began this afternoon.  Says he has a cardiac history of heart flutters for which he takes a medication however he is unsure what medication uses.  Review of Systems  Positive: Chest pain, palpitations Negative: Syncope  Physical Exam  BP 94/66 (BP Location: Right Arm)   Pulse 74   Temp 98.3 F (36.8 C) (Oral)   Resp 18   SpO2 97%  Gen:   Awake, no distress   Resp:  Normal effort  MSK:   Moves extremities without difficulty  Other:  RRR, CTAB  Medical Decision Making  Medically screening exam initiated at 2:52 PM.  Appropriate orders placed.  Princella Pellegrini was informed that the remainder of the evaluation will be completed by another provider, this initial triage assessment does not replace that evaluation, and the importance of remaining in the ED until their evaluation is complete.     Saddie Benders, PA-C 10/28/21 1454    Rozelle Logan, DO 10/28/21 [id number]

## 2021-10-28 NOTE — ED Provider Notes (Signed)
[facility] EMERGENCY DEPARTMENT Provider Note   CSN: [id number] Arrival date & time: 10/28/21  10-Apr-1436     History Chief Complaint  Patient presents with   Chest Pain    Ryan [name] is a 60 y.o. male.  HPI Patient presents with intermittent chest pain for the past week.  He has a history of a flutter with RVR, as well as hypertension, diabetes.  He states that his been taking his medication and diuretics regularly.  However, over the past week he has had multiple episodes of fluttering and chest tightness.  Currently he has none.  However, the most recent episode was earlier in the day.  No clear alleviating, exacerbating, precipitating episodes. No obvious weight gain, no new swelling, though he does feel as though the diuretic is not working.    Past Medical History:  Diagnosis Date   Achilles tendon rupture right   Arthritis    back and shoulders   Atrial flutter with rapid ventricular response ([place]) 07/22/2020   Chest pain    due to cocaine use   Diabetes mellitus without complication ([place])    Drug abuse ([place])    cocaine, marijuana abuse   GERD (gastroesophageal reflux disease)    Hypertension    Renal insufficiency     Patient Active Problem List   Diagnosis Date Noted   Foot pain, right 09/26/2020   Closed nondisplaced fracture of proximal phalanx of lesser toe of right foot 09/26/2020   Esophagitis    Midepigastric pain    Atrial flutter with rapid ventricular response ([place]) 07/22/2020   Cocaine abuse ([place])    COVID-19 virus infection 05/31/2019   Orthostatic syncope 05/31/2019   Diabetes mellitus type 2, insulin dependent ([place]) 05/31/2019   Drug abuse ([place])    CKD (chronic kidney disease), stage III ([place])    Right ankle pain 05/15/2014   Uncontrolled hypertension 05/14/2014   Diabetes mellitus ([place]) 05/14/2014   Rupture of right Achilles tendon 05/02/2014    Past Surgical History:  Procedure Laterality Date   ACHILLES  TENDON SURGERY Right 05/02/2014   Procedure: RIGHT ACHILLES TENDON REPAIR;  Surgeon: Johnn Hai, MD;  Location: [facility];  Service: Orthopedics;  Laterality: Right;   BIOPSY  07/25/2020   Procedure: BIOPSY;  Surgeon: Otis Brace, MD;  Location: [facility] ENDOSCOPY;  Service: Gastroenterology;;   CARDIAC CATHETERIZATION  05.31.2012   showing patent coronaries with no visualized vasospasm and EF of 55-60 %   ESOPHAGOGASTRODUODENOSCOPY N/A 07/25/2020   Procedure: ESOPHAGOGASTRODUODENOSCOPY (EGD);  Surgeon: Otis Brace, MD;  Location: [facility] ENDOSCOPY;  Service: Gastroenterology;  Laterality: N/A;   ORBITAL FRACTURE SURGERY Left 2-3 yrs ago       Family History  Problem Relation Age of Onset   Unexplained death Mother 95       She died in 10-Apr-2016, he does not remember what she died of   Unexplained death Father        He has no information on his father    Social History   Tobacco Use   Smoking status: Never   Smokeless tobacco: Never  Vaping Use   Vaping Use: Never used  Substance Use Topics   Alcohol use: Yes    Alcohol/week: 3.0 standard drinks    Types: 3 Cans of beer per week    Comment: occassional   Drug use: Yes    Types: Cocaine, Marijuana    Comment: last cocaine use 2 to 3 yrs ago, last marijuana use 4  to 5 years ago    Home Medications Prior to Admission medications   Medication Sig Start Date End Date Taking? Authorizing Provider  ANTI-FUNGAL 1 % cream Apply 1 application topically daily as needed (anti fungus). 07/23/21  Yes [provider]  apixaban (ELIQUIS) 5 MG TABS tablet Take 1 tablet (5 mg total) by mouth 2 (two) times daily. 07/25/20  Yes Sarajane Jews, Callie E, PA-C  COREG 3.125 MG tablet Take 3.125 mg by mouth 2 (two) times daily. 04/29/21  Yes [provider]  diltiazem (CARDIZEM CD) 240 MG 24 hr capsule Take 1 capsule (240 mg total) by mouth daily. 07/25/20  Yes Sande Rives E, PA-C  furosemide (LASIX) 40 MG tablet Take 1 tablet (40 mg  total) by mouth daily. 08/11/20  Yes Sarajane Jews, Callie E, PA-C  glipiZIDE (GLUCOTROL) 10 MG tablet Take 10 mg by mouth daily. 03/04/21  Yes [provider]  insulin NPH-regular Human (NOVOLIN 70/30) (70-30) 100 UNIT/ML injection Inject 35 units into the skin 2 times daily with a meal. Patient taking differently: Inject 40 Units into the skin 2 (two) times daily with a meal. Inject 35 units into the skin 2 times daily with a meal. 07/25/20  Yes Sande Rives E, PA-C  omeprazole (PRILOSEC) 40 MG capsule Take 40 mg by mouth daily. 01/07/21  Yes [provider]  pantoprazole (PROTONIX) 40 MG tablet Take 1 tablet (40 mg total) by mouth 2 (two) times daily. Patient taking differently: Take 40 mg by mouth 2 (two) times daily before a meal. 07/25/20  Yes Sande Rives E, PA-C  Potassium Chloride ER 20 MEQ TBCR Take 1 tablet by mouth daily. 10/15/21  Yes [provider]  ZYRTEC ALLERGY 10 MG tablet Take 10 mg by mouth in the morning. 03/03/20  Yes [provider]  losartan (COZAAR) 50 MG tablet Take 1 tablet (50 mg total) by mouth daily. Patient not taking: No sig reported 08/11/20   Sande Rives E, PA-C  potassium chloride SA (KLOR-CON) 20 MEQ tablet Take 1 tablet (20 mEq total) by mouth daily. Patient not taking: Reported on 10/28/2021 08/11/20   Sande Rives E, PA-C  sucralfate (CARAFATE) 1 g tablet Take 1 tablet (1 g total) by mouth 2 (two) times daily. 07/25/20 10/23/20  Sande Rives E, PA-C  triamcinolone cream (KENALOG) 0.1 % Apply 1 application topically 2 (two) times daily as needed (rash). 10/16/21   [provider]    Allergies    Patient has no known allergies.  Review of Systems   Review of Systems  Constitutional:        Per HPI, otherwise negative  HENT:         Per HPI, otherwise negative  Respiratory:         Per HPI, otherwise negative  Cardiovascular:        Per HPI, otherwise negative  Gastrointestinal:  Negative for  vomiting.  Endocrine:       Negative aside from HPI  Genitourinary:        Neg aside from HPI   Musculoskeletal:        Per HPI, otherwise negative  Skin: Negative.   Neurological:  Negative for syncope.   Physical Exam Updated Vital Signs BP (!) 167/108   Pulse 71   Temp 98.3 F (36.8 C) (Oral)   Resp 18   Ht 5\' 9"  (1.753 m)   Wt 108.9 kg   SpO2 99%   BMI 35.44 kg/m   Physical Exam Vitals and  nursing note reviewed.  Constitutional:      General: He is not in acute distress.    Appearance: He is well-developed.  HENT:     Head: Normocephalic and atraumatic.  Eyes:     Conjunctiva/sclera: Conjunctivae normal.  Cardiovascular:     Rate and Rhythm: Normal rate and regular rhythm.  Pulmonary:     Effort: Pulmonary effort is normal. No respiratory distress.     Breath sounds: No stridor.  Abdominal:     General: There is no distension.  Skin:    General: Skin is warm and dry.  Neurological:     Mental Status: He is alert and oriented to person, place, and time.    ED Results / Procedures / Treatments   Labs (all labs ordered are listed, but only abnormal results are displayed) Labs Reviewed  BASIC METABOLIC PANEL - Abnormal; Notable for the following components:      Result Value   Glucose, Bld 193 (*)    BUN 21 (*)    Creatinine, Ser 2.07 (*)    GFR, Estimated 36 (*)    All other components within normal limits  CBC - Abnormal; Notable for the following components:   RDW 16.9 (*)    All other components within normal limits  BRAIN NATRIURETIC PEPTIDE - Abnormal; Notable for the following components:   B Natriuretic Peptide 352.0 (*)    All other components within normal limits  TROPONIN I (HIGH SENSITIVITY) - Abnormal; Notable for the following components:   Troponin I (High Sensitivity) 19 (*)    All other components within normal limits  TROPONIN I (HIGH SENSITIVITY)    EKG EKG Interpretation  Date/Time:  Wednesday October 28 2021 14:43:22  EST Ventricular Rate:  80 PR Interval:  180 QRS Duration: 82 QT Interval:  390 QTC Calculation: 449 R Axis:   5 Text Interpretation: Normal sinus rhythm Septal infarct , age undetermined ST-t wave abnormality No significant change since last tracing Abnormal ECG Confirmed by Gerhard Munch [phone]) on 10/28/2021 4:45:53 PM  Radiology DG Chest 2 View  Result Date: 10/28/2021 CLINICAL DATA:  Left-sided chest pain for several hours, initial encounter EXAM: CHEST - 2 VIEW COMPARISON:  07/25/2021 FINDINGS: Cardiac shadow is stable. Lungs are again mildly hypoinflated without focal infiltrate or effusion. No bony abnormality is seen. Moderate-sized hiatal hernia is noted. IMPRESSION: No active cardiopulmonary disease. Electronically Signed   By: Alcide Clever M.D.   On: 10/28/2021 15:50    Procedures Procedures   Medications Ordered in ED Medications  furosemide (LASIX) injection 40 mg (has no administration in time range)    ED Course  I have reviewed the triage vital signs and the nursing notes.  Pertinent labs & imaging results that were available during my care of the patient were reviewed by me and considered in my medical decision making (see chart for details).  Cardiac monitor 60 sinus normal Pulse ox 98% room air normal 7:22 PM Second troponin decreased from initial value.  However, the patient's BNP is slightly elevated, 352.  Now, on additional chart review is clear at hospitalization last year during which he required diuresis, echocardiogram at that point notable for preserved ejection fraction, though diastolic dysfunction was demonstrated.  We discussed all findings, including elevated BNP, but with unremarkable chest x-ray, no increased work of breathing at rest, and no hypoxia we discussed options for follow-up including CHF clinic, for further consideration of his elevated BNP, and slight worsening of his renal function.  Patient is amenable to this, and we discussed the  option of hospitalization, but he states that he is more comfortable with the close outpatient follow-up option.  With no evidence for ACS, concurrent infection, bacteremia, sepsis, no decompensated state, patient received IV Lasix here, will start increased dosing, with close outpatient follow-up due to his CKD and Afib Hx. MDM Rules/Calculators/A&P MDM Number of Diagnoses or Management Options Atypical chest pain: new, needed workup Paroxysmal atrial fibrillation ([place]): established, worsening Renal dysfunction: established, worsening   Amount and/or Complexity of Data Reviewed Clinical lab tests: reviewed and ordered Tests in the radiology section of CPT: ordered and reviewed Tests in the medicine section of CPT: ordered and reviewed Decide to obtain previous medical records or to obtain history from someone other than the patient: yes Review and summarize past medical records: yes Independent visualization of images, tracings, or specimens: yes  Risk of Complications, Morbidity, and/or Mortality Presenting problems: high Diagnostic procedures: high Management options: high  Critical Care Total time providing critical care: < 30 minutes  Patient Progress Patient progress: stable   Final Clinical Impression(s) / ED Diagnoses Final diagnoses:  Atypical chest pain  Renal dysfunction  Paroxysmal atrial fibrillation ([place])     Carmin Muskrat, MD 10/28/21 1928

## 2021-10-28 NOTE — ED Notes (Signed)
Please have patient call his sister, Cookie.

## 2021-11-04 ENCOUNTER — Ambulatory Visit (HOSPITAL_COMMUNITY): Payer: Self-pay | Admitting: Physician Assistant

## 2021-11-04 NOTE — Progress Notes (Incomplete)
Primary Care Physician: Marliss Coots, NP Primary Cardiologist: Dr Debara Pickett Primary Electrophysiologist: none Referring Physician: [facility]   Ryan [name] is a 60 y.o. male with a history of DM, HTN, CKD, cocaine abuse, atrial flutter who presents for consultation in the [facility]. The patient was initially diagnosed with atrial flutter after presenting to the ED 07/2020 with symptoms of chest pain. ECG showed rapid atrial flutter. He was positive for cocaine at the time. He was started on diltiazem which converted him to SR. Patient is on Eliquis for a CHADS2VASC score of 2. He has several interim ED visits. Most recently, he presented to the ED 10/28/21 with chest pain. He also reports episodes of "fluttering" in his chest. He was in SR at the ED. BNP was 352. ***  Today, he denies symptoms of ***palpitations, chest pain, shortness of breath, orthopnea, PND, lower extremity edema, dizziness, presyncope, syncope, snoring, daytime somnolence, bleeding, or neurologic sequela. The patient is tolerating medications without difficulties and is otherwise without complaint today.    Atrial Fibrillation Risk Factors:  he {Action; does/does not:19097} have symptoms or diagnosis of sleep apnea. he {ACTION; IS/IS [id number] compliant with CPAP therapy. he {Action; does/does not:19097} have a history of rheumatic fever. he {Action; does/does not:19097} have a history of alcohol use. The patient {Action; does/does not:19097} have a history of early familial atrial fibrillation or other arrhythmias.  he has a BMI of There is no height or weight on file to calculate BMI.. There were no vitals filed for this visit.  Family History  Problem Relation Age of Onset   Unexplained death Mother 53       She died in 03/24/16, he does not remember what she died of   Unexplained death Father        He has no information on his father     Atrial Fibrillation  Management history:  Previous antiarrhythmic drugs: none Previous cardioversions: none Previous ablations: none CHADS2VASC score: 2 Anticoagulation history: Eliquis   Past Medical History:  Diagnosis Date   Achilles tendon rupture right   Arthritis    back and shoulders   Atrial flutter with rapid ventricular response ([place]) 07/22/2020   Chest pain    due to cocaine use   Diabetes mellitus without complication ([place])    Drug abuse ([place])    cocaine, marijuana abuse   GERD (gastroesophageal reflux disease)    Hypertension    Renal insufficiency    Past Surgical History:  Procedure Laterality Date   ACHILLES TENDON SURGERY Right 05/02/2014   Procedure: RIGHT ACHILLES TENDON REPAIR;  Surgeon: Johnn Hai, MD;  Location: [facility]S;  Service: Orthopedics;  Laterality: Right;   BIOPSY  07/25/2020   Procedure: BIOPSY;  Surgeon: Otis Brace, MD;  Location: [facility] ENDOSCOPY;  Service: Gastroenterology;;   CARDIAC CATHETERIZATION  05.31.2012   showing patent coronaries with no visualized vasospasm and EF of 55-60 %   ESOPHAGOGASTRODUODENOSCOPY N/A 07/25/2020   Procedure: ESOPHAGOGASTRODUODENOSCOPY (EGD);  Surgeon: Otis Brace, MD;  Location: [facility] END[facility]COPY;  Service: Gastroenterology;  Laterality: N/A;   ORBITAL FRACTURE SURGERY Left 2-3 yrs ago    Current Outpatient Medications  Medication Sig Dispense Refill   ANTI-FUNGAL 1 % cream Apply 1 application topically daily as needed (anti fungus).     apixaban (ELIQUIS) 5 MG TABS tablet Take 1 tablet (5 mg total) by mouth 2 (two) times daily. 60 tablet 2   COREG 3.125 MG tablet Take 3.125  mg by mouth 2 (two) times daily.     diltiazem (CARDIZEM CD) 240 MG 24 hr capsule Take 1 capsule (240 mg total) by mouth daily. 30 capsule 2   furosemide (LASIX) 40 MG tablet Take 1 tablet (40 mg total) by mouth 2 (two) times daily for 7 days. 30 tablet 0   glipiZIDE (GLUCOTROL) 10 MG tablet Take 10 mg by mouth daily.     insulin NPH-regular Human  (NOVOLIN 70/30) (70-30) 100 UNIT/ML injection Inject 35 units into the skin 2 times daily with a meal. (Patient taking differently: Inject 40 Units into the skin 2 (two) times daily with a meal. Inject 35 units into the skin 2 times daily with a meal.) 10 mL 1   losartan (COZAAR) 50 MG tablet Take 1 tablet (50 mg total) by mouth daily. (Patient not taking: No sig reported) 90 tablet 3   omeprazole (PRILOSEC) 40 MG capsule Take 40 mg by mouth daily.     pantoprazole (PROTONIX) 40 MG tablet Take 1 tablet (40 mg total) by mouth 2 (two) times daily. (Patient taking differently: Take 40 mg by mouth 2 (two) times daily before a meal.) 60 tablet 2   Potassium Chloride ER 20 MEQ TBCR Take 1 tablet by mouth daily.     potassium chloride SA (KLOR-CON) 20 MEQ tablet Take 1 tablet (20 mEq total) by mouth daily. (Patient not taking: Reported on 10/28/2021) 90 tablet 3   sucralfate (CARAFATE) 1 g tablet Take 1 tablet (1 g total) by mouth 2 (two) times daily. 60 tablet 2   triamcinolone cream (KENALOG) 0.1 % Apply 1 application topically 2 (two) times daily as needed (rash).     ZYRTEC ALLERGY 10 MG tablet Take 10 mg by mouth in the morning.     No current facility-administered medications for this visit.    No Known Allergies  Social History   Socioeconomic History   Marital status: Single    Spouse name: Not on file   Number of children: Not on file   Years of education: Not on file   Highest education level: Not on file  Occupational History   Not on file  Tobacco Use   Smoking status: Never   Smokeless tobacco: Never  Vaping Use   Vaping Use: Never used  Substance and Sexual Activity   Alcohol use: Yes    Alcohol/week: 3.0 standard drinks    Types: 3 Cans of beer per week    Comment: occassional   Drug use: Yes    Types: Cocaine, Marijuana    Comment: last cocaine use 2 to 3 yrs ago, last marijuana use 4 to 5 years ago   Sexual activity: Not on file  Other Topics Concern   Not on file   Social History Narrative   ** Merged History Encounter **       Pt currently lives with his brother, and his sister in [place]. He is employed by [employer]. He denies tobacco use. He does endorse occasional alcohol use. He does endorse cocaine use.  Family history : Non contributory   Social Determinants of Radio broadcast assistant Strain: Not on file  Food Insecurity: Not on file  Transportation Needs: Not on file  Physical Activity: Not on file  Stress: Not on file  Social Connections: Not on file  Intimate Partner Violence: Not on file     ROS- All systems are reviewed and negative except as per the HPI above.  Physical Exam: There  were no vitals filed for this visit.  GEN- The patient is a well appearing ***{Desc; male/male:11659}, alert and oriented x 3 today.   Head- normocephalic, atraumatic Eyes-  Sclera clear, conjunctiva pink Ears- hearing intact Oropharynx- clear Neck- supple  Lungs- Clear to ausculation bilaterally, normal work of breathing Heart- ***Regular rate and rhythm, no murmurs, rubs or gallops  GI- soft, NT, ND, + BS Extremities- no clubbing, cyanosis, or edema MS- no significant deformity or atrophy Skin- no rash or lesion Psych- euthymic mood, full affect Neuro- strength and sensation are intact  Wt Readings from Last 3 Encounters:  10/28/21 108.9 kg  07/26/21 110.2 kg  05/07/21 111.1 kg    EKG today demonstrates  ***  Echo 07/22/20 demonstrated  1. Left ventricular ejection fraction, by estimation, is 65 to 70%. The  left ventricle has hyperdynamic function. The left ventricle has no  regional wall motion abnormalities. There is mild concentric left  ventricular hypertrophy. Left ventricular  diastolic function could not be evaluated.   2. Right ventricular systolic function is normal. The right ventricular  size is normal.   3. Left atrial size was mildly dilated.   4. The mitral valve is normal in structure. No evidence of  mitral valve  regurgitation.   5. The aortic valve is tricuspid. Aortic valve regurgitation is not  visualized. Mild aortic valve sclerosis is present, with no evidence of  aortic valve stenosis.   Epic records are reviewed at length today  CHA2DS2-VASc Score =    The patient's score is based upon:   {Click here to calculate score.  REFRESH note before signing. :1}     ASSESSMENT AND PLAN: {Select the correct AFib Diagnosis                 :[id number]}    3. Obesity There is no height or weight on file to calculate BMI. Lifestyle modification was discussed at length including regular exercise and weight reduction. ***  4. Snoring***Obstructive sleep apnea The importance of adequate treatment of sleep apnea was discussed today in order to improve our ability to maintain sinus rhythm long term. ***  5. ***   Follow up ***   Jorja Loa PA-C [facility] [address] [place], [place] [address] [phone] 11/04/2021 9:18 AM

## 2021-11-11 ENCOUNTER — Emergency Department (HOSPITAL_COMMUNITY)
Admission: EM | Admit: 2021-11-11 | Discharge: 2021-11-11 | Disposition: A | Discharge status: Home / Self Care | Payer: BC Managed Care – PPO | Attending: Emergency Medicine | Admitting: Emergency Medicine

## 2021-11-11 ENCOUNTER — Other Ambulatory Visit: Payer: Self-pay

## 2021-11-11 ENCOUNTER — Encounter (HOSPITAL_COMMUNITY): Payer: Self-pay | Admitting: Emergency Medicine

## 2021-11-11 DIAGNOSIS — J101 Influenza due to other identified influenza virus with other respiratory manifestations: Secondary | ICD-10-CM | POA: Insufficient documentation

## 2021-11-11 DIAGNOSIS — Z7984 Long term (current) use of oral hypoglycemic drugs: Secondary | ICD-10-CM | POA: Diagnosis not present

## 2021-11-11 DIAGNOSIS — Z20822 Contact with and (suspected) exposure to covid-19: Secondary | ICD-10-CM | POA: Insufficient documentation

## 2021-11-11 DIAGNOSIS — Z7901 Long term (current) use of anticoagulants: Secondary | ICD-10-CM | POA: Insufficient documentation

## 2021-11-11 DIAGNOSIS — Z794 Long term (current) use of insulin: Secondary | ICD-10-CM | POA: Diagnosis not present

## 2021-11-11 DIAGNOSIS — I129 Hypertensive chronic kidney disease with stage 1 through stage 4 chronic kidney disease, or unspecified chronic kidney disease: Secondary | ICD-10-CM | POA: Insufficient documentation

## 2021-11-11 DIAGNOSIS — Z79899 Other long term (current) drug therapy: Secondary | ICD-10-CM | POA: Insufficient documentation

## 2021-11-11 DIAGNOSIS — E119 Type 2 diabetes mellitus without complications: Secondary | ICD-10-CM | POA: Insufficient documentation

## 2021-11-11 DIAGNOSIS — N183 Chronic kidney disease, stage 3 unspecified: Secondary | ICD-10-CM | POA: Diagnosis not present

## 2021-11-11 DIAGNOSIS — R059 Cough, unspecified: Secondary | ICD-10-CM | POA: Diagnosis present

## 2021-11-11 DIAGNOSIS — Z8616 Personal history of COVID-19: Secondary | ICD-10-CM | POA: Diagnosis not present

## 2021-11-11 LAB — CBC
HCT: 46.7 % (ref 39.0–52.0)
Hemoglobin: 14.9 g/dL (ref 13.0–17.0)
MCH: 25.9 pg — ABNORMAL LOW (ref 26.0–34.0)
MCHC: 31.9 g/dL (ref 30.0–36.0)
MCV: 81.2 fL (ref 80.0–100.0)
Platelets: 180 10*3/uL (ref 150–400)
RBC: 5.75 MIL/uL (ref 4.22–5.81)
RDW: 18 % — ABNORMAL HIGH (ref 11.5–15.5)
WBC: 4.5 10*3/uL (ref 4.0–10.5)
nRBC: 0 % (ref 0.0–0.2)

## 2021-11-11 LAB — COMPREHENSIVE METABOLIC PANEL
ALT: 24 U/L (ref 0–44)
AST: 29 U/L (ref 15–41)
Albumin: 3.6 g/dL (ref 3.5–5.0)
Alkaline Phosphatase: 104 U/L (ref 38–126)
Anion gap: 9 (ref 5–15)
BUN: 10 mg/dL (ref 6–20)
CO2: 24 mmol/L (ref 22–32)
Calcium: 8.5 mg/dL — ABNORMAL LOW (ref 8.9–10.3)
Chloride: 103 mmol/L (ref 98–111)
Creatinine, Ser: 1.72 mg/dL — ABNORMAL HIGH (ref 0.61–1.24)
GFR, Estimated: 46 mL/min — ABNORMAL LOW (ref >60)
Glucose, Bld: 162 mg/dL — ABNORMAL HIGH (ref 70–99)
Potassium: 3.5 mmol/L (ref 3.5–5.1)
Sodium: 136 mmol/L (ref 135–145)
Total Bilirubin: <0.1 mg/dL — ABNORMAL LOW (ref 0.3–1.2)
Total Protein: 7.6 g/dL (ref 6.5–8.1)

## 2021-11-11 LAB — URINALYSIS, MICROSCOPIC (REFLEX)
Bacteria, UA: NONE SEEN
Squamous Epithelial / HPF: NONE SEEN (ref 0–5)

## 2021-11-11 LAB — URINALYSIS, ROUTINE W REFLEX MICROSCOPIC
Bilirubin Urine: NEGATIVE
Glucose, UA: NEGATIVE mg/dL
Hgb urine dipstick: NEGATIVE
Ketones, ur: NEGATIVE mg/dL
Leukocytes,Ua: NEGATIVE
Nitrite: NEGATIVE
Protein, ur: 100 mg/dL — AB
Specific Gravity, Urine: 1.025 (ref 1.005–1.030)
pH: 6 (ref 5.0–8.0)

## 2021-11-11 LAB — RESP PANEL BY RT-PCR (FLU A&B, COVID) ARPGX2
Influenza A by PCR: POSITIVE — AB
Influenza B by PCR: NEGATIVE
SARS Coronavirus 2 by RT PCR: NEGATIVE

## 2021-11-11 LAB — LIPASE, BLOOD: Lipase: 30 U/L (ref 11–51)

## 2021-11-11 LAB — CBG MONITORING, ED: Glucose-Capillary: 173 mg/dL — ABNORMAL HIGH (ref 70–99)

## 2021-11-11 MED ORDER — ONDANSETRON HCL 4 MG/2ML IJ SOLN: 4.0000 mg | Freq: Once | INTRAMUSCULAR | Status: DC

## 2021-11-11 MED ORDER — BENZONATATE 100 MG PO CAPS
100.0000 mg | ORAL_CAPSULE | Freq: Once | ORAL | Status: AC
  Administered 2021-11-11: 100 mg via ORAL
  Filled 2021-11-11: qty 1

## 2021-11-11 MED ORDER — BENZONATATE 100 MG PO CAPS: 100.0000 mg | ORAL_CAPSULE | Freq: Three times a day (TID) | ORAL | 0 refills | Status: DC | PRN

## 2021-11-11 MED ORDER — ONDANSETRON 4 MG PO TBDP
4.0000 mg | ORAL_TABLET | Freq: Once | ORAL | Status: AC
  Administered 2021-11-11: 4 mg via ORAL
  Filled 2021-11-11: qty 1

## 2021-11-11 MED ORDER — ONDANSETRON HCL 4 MG PO TABS: 4.0000 mg | ORAL_TABLET | Freq: Three times a day (TID) | ORAL | 0 refills | Status: DC | PRN

## 2021-11-11 MED ORDER — APIXABAN 2.5 MG PO TABS: 2.5000 mg | ORAL_TABLET | Freq: Two times a day (BID) | ORAL | 0 refills | Status: DC

## 2021-11-11 NOTE — ED Provider Notes (Signed)
Emergency Medicine Provider Triage Evaluation Note  Ryan [name] , a 60 y.o. male  was evaluated in triage.  Pt complains of generalized weakness, nausea, vomiting for the past 2 days.  Patient denies any chest pain, shortness of breath, abdominal pain, fevers, unilateral weakness.  Denies any flulike symptoms.  No medications trialed for this.  Patient reports he feels that he is dehydrated.  Review of Systems  Positive: Generalized weakness, nausea, vomiting Negative: Pain, shortness of breath, flulike symptoms, abdominal pain  Physical Exam  BP 139/88 (BP Location: Left Arm)   Pulse 81   Temp 98.9 F (37.2 C) (Oral)   Resp 18   Ht 5\' 9"  (1.753 m)   Wt 109.3 kg   SpO2 97%   BMI 35.59 kg/m  Gen:   Awake, no distress   Resp:  Normal effort  MSK:   Moves extremities without difficulty  Other:  Strength 5 out of 5 with and without resistance.  No focal deficits.  Abdomen soft nontender.  Medical Decision Making  Medically screening exam initiated at 11:16 AM.  Appropriate orders placed.  was informed that the remainder of the evaluation will be completed by another provider, this initial triage assessment does not replace that evaluation, and the importance of remaining in the ED until their evaluation is complete.  Labs placed. Imaging deferred at this time.    Princella Pellegrini, PA-C 11/11/21 1117    11/13/21, MD 11/12/21 1341

## 2021-11-11 NOTE — ED Triage Notes (Signed)
Patient arrives ambulatory c/o feeling very tired and weak x 3 days. Reports emesis- denies any abdominal pain.

## 2021-11-11 NOTE — ED Provider Notes (Signed)
[facility] EMERGENCY DEPARTMENT Provider Note   CSN: [id number] Arrival date & time: 11/11/21  [id number]     History Chief Complaint  Patient presents with   Fatigue    Ryan [name] is a 60 y.o. male.  HPI     3 days of fatigue, vomiting for 3 days, close to 10 times a day probably, not counting. Diarrhea. No body aches. No fever. No chest pain. Little bit of a cough, sometimes worse then will calm down. No chills. No sore throat, little bit of runny nose.  Felt some stiffness and pain with movement in neck the other day but not now.  No known sick contacts.  Severe fatigue.  No dysuria. No syncope or lightheadedness. Abd pain yesterday but improved now  Hx of aflutter, has been off of anticoagulation because it was expensive   Past Medical History:  Diagnosis Date   Achilles tendon rupture right   Arthritis    back and shoulders   Atrial flutter with rapid ventricular response ([place]) 07/22/2020   Chest pain    due to cocaine use   Diabetes mellitus without complication ([place])    Drug abuse ([place])    cocaine, marijuana abuse   GERD (gastroesophageal reflux disease)    Hypertension    Renal insufficiency     Patient Active Problem List   Diagnosis Date Noted   Foot pain, right 09/26/2020   Closed nondisplaced fracture of proximal phalanx of lesser toe of right foot 09/26/2020   Esophagitis    Midepigastric pain    Atrial flutter with rapid ventricular response ([place]) 07/22/2020   Cocaine abuse ([place])    COVID-19 virus infection 05/31/2019   Orthostatic syncope 05/31/2019   Diabetes mellitus type 2, insulin dependent ([place]) 05/31/2019   Drug abuse ([place])    CKD (chronic kidney disease), stage III ([place])    Right ankle pain 05/15/2014   Uncontrolled hypertension 05/14/2014   Diabetes mellitus ([place]) 05/14/2014   Rupture of right Achilles tendon 05/02/2014    Past Surgical History:  Procedure Laterality Date   ACHILLES TENDON SURGERY Right  05/02/2014   Procedure: RIGHT ACHILLES TENDON REPAIR;  Surgeon: Johnn Hai, MD;  Location: [facility];  Service: Orthopedics;  Laterality: Right;   BIOPSY  07/25/2020   Procedure: BIOPSY;  Surgeon: Otis Brace, MD;  Location: [facility];  Service: Gastroenterology;;   CARDIAC CATHETERIZATION  05.31.2012   showing patent coronaries with no visualized vasospasm and EF of 55-60 %   ESOPHAGOGASTRODUODENOSCOPY N/A 07/25/2020   Procedure: ESOPHAGOGASTRODUODENOSCOPY (EGD);  Surgeon: Otis Brace, MD;  Location: [facility] ENDOSCOPY;  Service: Gastroenterology;  Laterality: N/A;   ORBITAL FRACTURE SURGERY Left 2-3 yrs ago       Family History  Problem Relation Age of Onset   Unexplained death Mother 56       She died in 03/22/2016, he does not remember what she died of   Unexplained death Father        He has no information on his father    Social History   Tobacco Use   Smoking status: Never   Smokeless tobacco: Never  Vaping Use   Vaping Use: Never used  Substance Use Topics   Alcohol use: Yes    Alcohol/week: 3.0 standard drinks    Types: 3 Cans of beer per week    Comment: occassional   Drug use: Yes    Types: Cocaine, Marijuana    Comment: last cocaine use 2 to 3 yrs  ago, last marijuana use 4 to 5 years ago    Home Medications Prior to Admission medications   Medication Sig Start Date End Date Taking? Authorizing Provider  apixaban (ELIQUIS) 2.5 MG TABS tablet Take 1 tablet (2.5 mg total) by mouth 2 (two) times daily. 11/11/21 12/11/21 Yes Gareth Morgan, MD  benzonatate (TESSALON) 100 MG capsule Take 1 capsule (100 mg total) by mouth 3 (three) times daily as needed for cough. 11/11/21  Yes Gareth Morgan, MD  ondansetron (ZOFRAN) 4 MG tablet Take 1 tablet (4 mg total) by mouth every 8 (eight) hours as needed for nausea or vomiting. 11/11/21  Yes Gareth Morgan, MD  ANTI-FUNGAL 1 % cream Apply 1 application topically daily as needed (anti fungus). 07/23/21   [provider]  COREG 3.125 MG tablet Take 3.125 mg by mouth 2 (two) times daily. 04/29/21   [provider]  diltiazem (CARDIZEM CD) 240 MG 24 hr capsule Take 1 capsule (240 mg total) by mouth daily. 07/25/20   Sande Rives E, PA-C  furosemide (LASIX) 40 MG tablet Take 1 tablet (40 mg total) by mouth 2 (two) times daily for 7 days. 10/28/21 11/04/21  Carmin Muskrat, MD  glipiZIDE (GLUCOTROL) 10 MG tablet Take 10 mg by mouth daily. 03/04/21   [provider]  insulin NPH-regular Human (NOVOLIN 70/30) (70-30) 100 UNIT/ML injection Inject 35 units into the skin 2 times daily with a meal. Patient taking differently: Inject 40 Units into the skin 2 (two) times daily with a meal. Inject 35 units into the skin 2 times daily with a meal. 07/25/20   Sande Rives E, PA-C  losartan (COZAAR) 50 MG tablet Take 1 tablet (50 mg total) by mouth daily. Patient not taking: No sig reported 08/11/20   Darreld Mclean, PA-C  omeprazole (PRILOSEC) 40 MG capsule Take 40 mg by mouth daily. 01/07/21   [provider]  pantoprazole (PROTONIX) 40 MG tablet Take 1 tablet (40 mg total) by mouth 2 (two) times daily. Patient taking differently: Take 40 mg by mouth 2 (two) times daily before a meal. 07/25/20   Sande Rives E, PA-C  Potassium Chloride ER 20 MEQ TBCR Take 1 tablet by mouth daily. 10/15/21   [provider]  potassium chloride SA (KLOR-CON) 20 MEQ tablet Take 1 tablet (20 mEq total) by mouth daily. Patient not taking: Reported on 10/28/2021 08/11/20   Sande Rives E, PA-C  sucralfate (CARAFATE) 1 g tablet Take 1 tablet (1 g total) by mouth 2 (two) times daily. 07/25/20 10/23/20  Sande Rives E, PA-C  triamcinolone cream (KENALOG) 0.1 % Apply 1 application topically 2 (two) times daily as needed (rash). 10/16/21   [provider]  ZYRTEC ALLERGY 10 MG tablet Take 10 mg by mouth in the morning. 03/03/20   [provider]    Allergies    Patient has  no known allergies.  Review of Systems   Review of Systems  Constitutional:  Positive for appetite change and fatigue. Negative for fever.  HENT:  Negative for sore throat.   Eyes:  Negative for visual disturbance.  Respiratory:  Positive for cough and shortness of breath.   Cardiovascular:  Negative for chest pain.  Gastrointestinal:  Positive for diarrhea, nausea and vomiting. Negative for abdominal pain.  Genitourinary:  Negative for difficulty urinating and dysuria.  Musculoskeletal:  Negative for back pain and neck stiffness.  Skin:  Negative for rash.  Neurological:  Negative for syncope, light-headedness and headaches.   Physical  Exam Updated Vital Signs BP (!) 164/100   Pulse 70   Temp 98.3 F (36.8 C) (Oral)   Resp 18   Ht 5\' 9"  (1.753 m)   Wt 109.3 kg   SpO2 98%   BMI 35.59 kg/m   Physical Exam Vitals and nursing note reviewed.  Constitutional:      General: He is not in acute distress.    Appearance: He is well-developed. He is not diaphoretic.  HENT:     Head: Normocephalic and atraumatic.  Eyes:     Conjunctiva/sclera: Conjunctivae normal.  Cardiovascular:     Rate and Rhythm: Normal rate and regular rhythm.     Heart sounds: Normal heart sounds. No murmur heard.   No friction rub. No gallop.  Pulmonary:     Effort: Pulmonary effort is normal. No respiratory distress.     Breath sounds: Normal breath sounds. No wheezing or rales.  Abdominal:     General: There is no distension.     Palpations: Abdomen is soft.     Tenderness: There is no abdominal tenderness. There is no guarding.  Musculoskeletal:     Cervical back: Normal range of motion.  Skin:    General: Skin is warm and dry.  Neurological:     Mental Status: He is alert and oriented to person, place, and time.    ED Results / Procedures / Treatments   Labs (all labs ordered are listed, but only abnormal results are displayed) Labs Reviewed  RESP PANEL BY RT-PCR (FLU A&B, COVID) ARPGX2 -  Abnormal; Notable for the following components:      Result Value   Influenza A by PCR POSITIVE (*)    All other components within normal limits  COMPREHENSIVE METABOLIC PANEL - Abnormal; Notable for the following components:   Glucose, Bld 162 (*)    Creatinine, Ser 1.72 (*)    Calcium 8.5 (*)    Total Bilirubin <0.1 (*)    GFR, Estimated 46 (*)    All other components within normal limits  CBC - Abnormal; Notable for the following components:   MCH 25.9 (*)    RDW 18.0 (*)    All other components within normal limits  URINALYSIS, ROUTINE W REFLEX MICROSCOPIC - Abnormal; Notable for the following components:   Protein, ur 100 (*)    All other components within normal limits  CBG MONITORING, ED - Abnormal; Notable for the following components:   Glucose-Capillary 173 (*)    All other components within normal limits  LIPASE, BLOOD  URINALYSIS, MICROSCOPIC (REFLEX)    EKG EKG Interpretation  Date/Time:  Wednesday November 11 2021 11:20:59 EST Ventricular Rate:  75 PR Interval:  170 QRS Duration: 70 QT Interval:  392 QTC Calculation: 437 R Axis:   80 Text Interpretation: Normal sinus rhythm ST & T wave abnormality, consider inferolateral ischemia Abnormal ECG Similar TW changes lateral leads, more pronounced Confirmed by Gareth Morgan [phone]) on 11/11/2021 4:36:53 PM  Radiology No results found.  Procedures Procedures   Medications Ordered in ED Medications  benzonatate (TESSALON) capsule 100 mg (100 mg Oral Given 11/11/21 1743)  ondansetron (ZOFRAN-ODT) disintegrating tablet 4 mg (4 mg Oral Given 11/11/21 1743)    ED Course  I have reviewed the triage vital signs and the nursing notes.  Pertinent labs & imaging results that were available during my care of the patient were reviewed by me and considered in my medical decision making (see chart for details).    MDM Rules/Calculators/A&P  60yo male with history of atrial flutter, DM,  hypertension, CKD, presents with concern for fatigue, vomiting, cough, diarrhea.  Tested positive for influenza. CXR without acute findings. Cr similar to prior, no leukocytosis. No signs of DKA. Low clinical suspicion at this time for acute CHF or other abnormalities. Is tolerating po, stable for outpatient follow up.  Given tamiflu, zofran, tesslon and rx for eliquis as he has not been taking it and out. Patient discharged in stable condition with understanding of reasons to return.     Final Clinical Impression(s) / ED Diagnoses Final diagnoses:  Influenza A    Rx / DC Orders ED Discharge Orders          Ordered    ondansetron (ZOFRAN) 4 MG tablet  Every 8 hours PRN        11/11/21 1830    benzonatate (TESSALON) 100 MG capsule  3 times daily PRN        11/11/21 1830    apixaban (ELIQUIS) 2.5 MG TABS tablet  2 times daily        11/11/21 1831             Alvira Monday, MD 11/12/21 1549

## 2021-12-10 ENCOUNTER — Inpatient Hospital Stay (HOSPITAL_COMMUNITY): Admission: RE | Admit: 2021-12-10 | Payer: Self-pay | Source: Ambulatory Visit | Admitting: Physician Assistant

## 2021-12-10 NOTE — Progress Notes (Incomplete)
Primary Care Physician: Lavinia Sharps, NP Primary Cardiologist: Dr Rennis Golden Primary Electrophysiologist: none Referring Physician: [facility]   Ryan [name] is a 60 y.o. male with a history of ***, atrial fibrillation who presents for consultation in the [facility].  The patient was initially diagnosed with atrial fibrillation *** after presenting to *** with symptoms of ***. Patient is on *** for a CHADS2VASC score of ***.  Today, he denies symptoms of ***palpitations, chest pain, shortness of breath, orthopnea, PND, lower extremity edema, dizziness, presyncope, syncope, snoring, daytime somnolence, bleeding, or neurologic sequela. The patient is tolerating medications without difficulties and is otherwise without complaint today.    Atrial Fibrillation Risk Factors:  he {Action; does/does not:19097} have symptoms or diagnosis of sleep apnea. he {ACTION; IS/IS [id number]} compliant with CPAP therapy. he {Action; does/does not:19097} have a history of rheumatic fever. he {Action; does/does not:19097} have a history of alcohol use. The patient {Action; does/does not:19097} have a history of early familial atrial fibrillation or other arrhythmias.  he has a BMI of There is no height or weight on file to calculate BMI.. There were no vitals filed for this visit.  Family History  Problem Relation Age of Onset   Unexplained death Mother 24       She died in 03-29-2016, he does not remember what she died of   Unexplained death Father        He has no information on his father     Atrial Fibrillation Management history:  Previous antiarrhythmic drugs: *** Previous cardioversions: *** Previous ablations: *** CHADS2VASC score: *** Anticoagulation history: ***   Past Medical History:  Diagnosis Date   Achilles tendon rupture right   Arthritis    back and shoulders   Atrial flutter with rapid ventricular response ([facility]) 07/22/2020   Chest  pain    due to cocaine use   Diabetes mellitus without complication ([facility])    Drug abuse ([facility])    cocaine, marijuana abuse   GERD (gastroesophageal reflux disease)    Hypertension    Renal insufficiency    Past Surgical History:  Procedure Laterality Date   ACHILLES TENDON SURGERY Right 05/02/2014   Procedure: RIGHT ACHILLES TENDON REPAIR;  Surgeon: Javier Docker, MD;  Location: [facility];  Service: Orthopedics;  Laterality: Right;   BIOPSY  07/25/2020   Procedure: BIOPSY;  Surgeon: Kathi Der, MD;  Location: [facility];  Service: Gastroenterology;;   CARDIAC CATHETERIZATION  05.31.2012   showing patent coronaries with no visualized vasospasm and EF of 55-60 %   ESOPHAGOGASTRODUODENOSCOPY N/A 07/25/2020   Procedure: ESOPHAGOGASTRODUODENOSCOPY (EGD);  Surgeon: Kathi Der, MD;  Location: [facility] ENDOSCOPY;  Service: Gastroenterology;  Laterality: N/A;   ORBITAL FRACTURE SURGERY Left 2-3 yrs ago    Current Outpatient Medications  Medication Sig Dispense Refill   ANTI-FUNGAL 1 % cream Apply 1 application topically daily as needed (anti fungus).     apixaban (ELIQUIS) 2.5 MG TABS tablet Take 1 tablet (2.5 mg total) by mouth 2 (two) times daily. 60 tablet 0   benzonatate (TESSALON) 100 MG capsule Take 1 capsule (100 mg total) by mouth 3 (three) times daily as needed for cough. 21 capsule 0   COREG 3.125 MG tablet Take 3.125 mg by mouth 2 (two) times daily.     diltiazem (CARDIZEM CD) 240 MG 24 hr capsule Take 1 capsule (240 mg total) by mouth daily. 30 capsule 2   furosemide (LASIX) 40 MG tablet Take  1 tablet (40 mg total) by mouth 2 (two) times daily for 7 days. 30 tablet 0   glipiZIDE (GLUCOTROL) 10 MG tablet Take 10 mg by mouth daily.     insulin NPH-regular Human (NOVOLIN 70/30) (70-30) 100 UNIT/ML injection Inject 35 units into the skin 2 times daily with a meal. (Patient taking differently: Inject 40 Units into the skin 2 (two) times daily with a meal. Inject 35 units into the  skin 2 times daily with a meal.) 10 mL 1   losartan (COZAAR) 50 MG tablet Take 1 tablet (50 mg total) by mouth daily. (Patient not taking: No sig reported) 90 tablet 3   omeprazole (PRILOSEC) 40 MG capsule Take 40 mg by mouth daily.     ondansetron (ZOFRAN) 4 MG tablet Take 1 tablet (4 mg total) by mouth every 8 (eight) hours as needed for nausea or vomiting. 12 tablet 0   pantoprazole (PROTONIX) 40 MG tablet Take 1 tablet (40 mg total) by mouth 2 (two) times daily. (Patient taking differently: Take 40 mg by mouth 2 (two) times daily before a meal.) 60 tablet 2   Potassium Chloride ER 20 MEQ TBCR Take 1 tablet by mouth daily.     potassium chloride SA (KLOR-CON) 20 MEQ tablet Take 1 tablet (20 mEq total) by mouth daily. (Patient not taking: Reported on 10/28/2021) 90 tablet 3   sucralfate (CARAFATE) 1 g tablet Take 1 tablet (1 g total) by mouth 2 (two) times daily. 60 tablet 2   triamcinolone cream (KENALOG) 0.1 % Apply 1 application topically 2 (two) times daily as needed (rash).     ZYRTEC ALLERGY 10 MG tablet Take 10 mg by mouth in the morning.     No current facility-administered medications for this visit.    No Known Allergies  Social History   Socioeconomic History   Marital status: Single    Spouse name: Not on file   Number of children: Not on file   Years of education: Not on file   Highest education level: Not on file  Occupational History   Not on file  Tobacco Use   Smoking status: Never   Smokeless tobacco: Never  Vaping Use   Vaping Use: Never used  Substance and Sexual Activity   Alcohol use: Yes    Alcohol/week: 3.0 standard drinks    Types: 3 Cans of beer per week    Comment: occassional   Drug use: Yes    Types: Cocaine, Marijuana    Comment: last cocaine use 2 to 3 yrs ago, last marijuana use 4 to 5 years ago   Sexual activity: Not on file  Other Topics Concern   Not on file  Social History Narrative   ** Merged History Encounter **       Pt currently  lives with his brother, and his sister in [place]. He is employed by [employer]. He denies tobacco use. He does endorse occasional alcohol use. He does endorse cocaine use.  Family history : Non contributory   Social Determinants of Radio broadcast assistant Strain: Not on file  Food Insecurity: Not on file  Transportation Needs: Not on file  Physical Activity: Not on file  Stress: Not on file  Social Connections: Not on file  Intimate Partner Violence: Not on file     ROS- All systems are reviewed and negative except as per the HPI above.  Physical Exam: There were no vitals filed for this visit.  GEN- The patient  is a well appearing ***{Desc; male/male:[id number]}, alert and oriented x 3 today.   Head- normocephalic, atraumatic Eyes-  Sclera clear, conjunctiva pink Ears- hearing intact Oropharynx- clear Neck- supple  Lungs- Clear to ausculation bilaterally, normal work of breathing Heart- ***Regular rate and rhythm, no murmurs, rubs or gallops  GI- soft, NT, ND, + BS Extremities- no clubbing, cyanosis, or edema MS- no significant deformity or atrophy Skin- no rash or lesion Psych- euthymic mood, full affect Neuro- strength and sensation are intact  Wt Readings from Last 3 Encounters:  11/11/21 109.3 kg  10/28/21 108.9 kg  07/26/21 110.2 kg    EKG today demonstrates ***  Echo *** demonstrated ***  Epic records are reviewed at length today  CHA2DS2-VASc Score =    The patient's score is based upon:   {Click here to calculate score.  REFRESH note before signing. :1}     ASSESSMENT AND PLAN: {Select the correct AFib Diagnosis                 :[id number]   Signed,  Oliver Barre, PA    12/10/2021 1:43 PM    3. Obesity There is no height or weight on file to calculate BMI. Lifestyle modification was discussed at length including regular exercise and weight reduction. ***  4. Snoring***Obstructive sleep apnea The importance of adequate treatment of  sleep apnea was discussed today in order to improve our ability to maintain sinus rhythm long term. ***  5. ***   Follow up ***   [name] PA-C [facility] [address] [place], [place] [address] [phone] 12/10/2021 1:43 PM

## 2021-12-23 ENCOUNTER — Ambulatory Visit (HOSPITAL_COMMUNITY): Payer: Self-pay | Admitting: Physician Assistant

## 2022-01-06 ENCOUNTER — Ambulatory Visit (HOSPITAL_COMMUNITY)
Admission: RE | Admit: 2022-01-06 | Discharge: 2022-01-06 | Disposition: A | Discharge status: Home / Self Care | Payer: BC Managed Care – PPO | Source: Ambulatory Visit | Attending: Physician Assistant | Admitting: Physician Assistant

## 2022-01-06 ENCOUNTER — Other Ambulatory Visit: Payer: Self-pay

## 2022-01-06 ENCOUNTER — Encounter (HOSPITAL_COMMUNITY): Payer: Self-pay

## 2022-01-06 ENCOUNTER — Encounter (HOSPITAL_COMMUNITY): Payer: Self-pay | Admitting: Physician Assistant

## 2022-01-06 VITALS — BP 146/68 | HR 76 | Ht 69.0 in | Wt 243.6 lb

## 2022-01-06 DIAGNOSIS — E1122 Type 2 diabetes mellitus with diabetic chronic kidney disease: Secondary | ICD-10-CM | POA: Insufficient documentation

## 2022-01-06 DIAGNOSIS — I4892 Unspecified atrial flutter: Secondary | ICD-10-CM | POA: Diagnosis present

## 2022-01-06 DIAGNOSIS — D6869 Other thrombophilia: Secondary | ICD-10-CM | POA: Diagnosis not present

## 2022-01-06 DIAGNOSIS — N189 Chronic kidney disease, unspecified: Secondary | ICD-10-CM | POA: Insufficient documentation

## 2022-01-06 DIAGNOSIS — Z6835 Body mass index (BMI) 35.0-35.9, adult: Secondary | ICD-10-CM | POA: Diagnosis not present

## 2022-01-06 DIAGNOSIS — E669 Obesity, unspecified: Secondary | ICD-10-CM | POA: Insufficient documentation

## 2022-01-06 DIAGNOSIS — I129 Hypertensive chronic kidney disease with stage 1 through stage 4 chronic kidney disease, or unspecified chronic kidney disease: Secondary | ICD-10-CM | POA: Diagnosis not present

## 2022-01-06 MED ORDER — DILTIAZEM HCL ER COATED BEADS 240 MG PO CP24: 240.0000 mg | ORAL_CAPSULE | Freq: Every day | ORAL | 6 refills | Status: DC

## 2022-01-06 MED ORDER — APIXABAN 5 MG PO TABS: 5.0000 mg | ORAL_TABLET | Freq: Two times a day (BID) | ORAL | 6 refills | Status: DC

## 2022-01-06 MED ORDER — CARVEDILOL 3.125 MG PO TABS: 3.1250 mg | ORAL_TABLET | Freq: Two times a day (BID) | ORAL | 6 refills | Status: DC

## 2022-01-06 NOTE — Progress Notes (Signed)
Primary Care Physician: Marliss Coots, NP Primary Cardiologist: Dr Debara Pickett Primary Electrophysiologist: none Referring Physician: [facility]   Ryan [name] is a 61 y.o. male with a history of atrial flutter, DM, HTN, CKD, cocaine abuse, who presents for consultation in the [facility]. Patient is on Eliquis for a CHADS2VASC score of 2. Patient has had several visits to the ED for various reasons over the past year. He was seen 10/28/21 with chest pain. BNP was mildly elevated. He was in SR at the time. He reports today that he has been out of his medications for several days. He does have brief palpitations (30-40 minutes) about twice per month. He has not been taking Eliquis due to cost concerns.   Today, he denies symptoms of shortness of breath, orthopnea, PND, lower extremity edema, dizziness, presyncope, syncope, snoring, daytime somnolence, bleeding, or neurologic sequela. The patient is tolerating medications without difficulties and is otherwise without complaint today.    Atrial Fibrillation Risk Factors:  he does not have symptoms or diagnosis of sleep apnea. he does not have a history of rheumatic fever. he does not have a history of alcohol use. The patient does not have a history of early familial atrial fibrillation or other arrhythmias.  he has a BMI of Body mass index is 35.97 kg/m.Marland Kitchen Filed Weights   01/06/22 1028  Weight: 110.5 kg    Family History  Problem Relation Age of Onset   Unexplained death Mother 73       She died in 04-03-16, he does not remember what she died of   Unexplained death Father        He has no information on his father     Atrial Fibrillation Management history:  Previous antiarrhythmic drugs: none Previous cardioversions: none Previous ablations: none CHADS2VASC score: 2 Anticoagulation history: Eliquis   Past Medical History:  Diagnosis Date   Achilles tendon rupture right   Arthritis     back and shoulders   Atrial flutter with rapid ventricular response ([place]) 07/22/2020   Chest pain    due to cocaine use   Diabetes mellitus without complication ([place])    Drug abuse ([place])    cocaine, marijuana abuse   GERD (gastroesophageal reflux disease)    Hypertension    Renal insufficiency    Past Surgical History:  Procedure Laterality Date   ACHILLES TENDON SURGERY Right 05/02/2014   Procedure: RIGHT ACHILLES TENDON REPAIR;  Surgeon: Johnn Hai, MD;  Location: [facility];  Service: Orthopedics;  Laterality: Right;   BIOPSY  07/25/2020   Procedure: BIOPSY;  Surgeon: Otis Brace, MD;  Location: [facility];  Service: Gastroenterology;;   CARDIAC CATHETERIZATION  05.31.2012   showing patent coronaries with no visualized vasospasm and EF of 55-60 %   ESOPHAGOGASTRODUODENOSCOPY N/A 07/25/2020   Procedure: ESOPHAGOGASTRODUODENOSCOPY (EGD);  Surgeon: Otis Brace, MD;  Location: [facility];  Service: Gastroenterology;  Laterality: N/A;   ORBITAL FRACTURE SURGERY Left 2-3 yrs ago    Current Outpatient Medications  Medication Sig Dispense Refill   ANTI-FUNGAL 1 % cream Apply 1 application topically daily as needed (anti fungus).     apixaban (ELIQUIS) 2.5 MG TABS tablet Take 1 tablet (2.5 mg total) by mouth 2 (two) times daily. 60 tablet 0   benzonatate (TESSALON) 100 MG capsule Take 1 capsule (100 mg total) by mouth 3 (three) times daily as needed for cough. 21 capsule 0   COREG 3.125 MG tablet Take 3.125  mg by mouth 2 (two) times daily.     diltiazem (CARDIZEM CD) 240 MG 24 hr capsule Take 1 capsule (240 mg total) by mouth daily. 30 capsule 2   furosemide (LASIX) 40 MG tablet Take 1 tablet (40 mg total) by mouth 2 (two) times daily for 7 days. 30 tablet 0   glipiZIDE (GLUCOTROL) 10 MG tablet Take 10 mg by mouth daily.     insulin NPH-regular Human (NOVOLIN 70/30) (70-30) 100 UNIT/ML injection Inject 35 units into the skin 2 times daily with a meal. (Patient taking  differently: Inject 40 Units into the skin 2 (two) times daily with a meal. Inject 35 units into the skin 2 times daily with a meal.) 10 mL 1   losartan (COZAAR) 50 MG tablet Take 1 tablet (50 mg total) by mouth daily. 90 tablet 3   omeprazole (PRILOSEC) 40 MG capsule Take 40 mg by mouth daily.     ondansetron (ZOFRAN) 4 MG tablet Take 1 tablet (4 mg total) by mouth every 8 (eight) hours as needed for nausea or vomiting. 12 tablet 0   pantoprazole (PROTONIX) 40 MG tablet Take 1 tablet (40 mg total) by mouth 2 (two) times daily. (Patient taking differently: Take 40 mg by mouth 2 (two) times daily before a meal.) 60 tablet 2   Potassium Chloride ER 20 MEQ TBCR Take 1 tablet by mouth daily.     potassium chloride SA (KLOR-CON) 20 MEQ tablet Take 1 tablet (20 mEq total) by mouth daily. 90 tablet 3   triamcinolone cream (KENALOG) 0.1 % Apply 1 application topically 2 (two) times daily as needed (rash).     ZYRTEC ALLERGY 10 MG tablet Take 10 mg by mouth in the morning.     sucralfate (CARAFATE) 1 g tablet Take 1 tablet (1 g total) by mouth 2 (two) times daily. 60 tablet 2   No current facility-administered medications for this encounter.    No Known Allergies  Social History   Socioeconomic History   Marital status: Single    Spouse name: Not on file   Number of children: Not on file   Years of education: Not on file   Highest education level: Not on file  Occupational History   Not on file  Tobacco Use   Smoking status: Never   Smokeless tobacco: Never  Vaping Use   Vaping Use: Never used  Substance and Sexual Activity   Alcohol use: Yes    Alcohol/week: 2.0 standard drinks    Types: 2 Shots of liquor per week    Comment: 2 shots once week 01/06/22   Drug use: Not Currently    Types: Cocaine, Marijuana    Comment: last cocaine use 1 year ago, last marijuana use 4 to 5 years ago 01/06/22   Sexual activity: Not on file  Other Topics Concern   Not on file  Social History Narrative    ** Merged History Encounter **       Pt currently lives with his brother, and his sister in [place]. He is employed by [employer]. He denies tobacco use. He does endorse occasional alcohol use. He does endorse cocaine use.  Family history : Non contributory   Social Determinants of Radio broadcast assistant Strain: Not on file  Food Insecurity: Not on file  Transportation Needs: Not on file  Physical Activity: Not on file  Stress: Not on file  Social Connections: Not on file  Intimate Partner Violence: Not on file  ROS- All systems are reviewed and negative except as per the HPI above.  Physical Exam: Vitals:   01/06/22 1028  BP: (!) 146/68  Pulse: 76  Weight: 110.5 kg  Height: 5\' 9"  (1.753 m)    GEN- The patient is a well appearing obese male, alert and oriented x 3 today.   Head- normocephalic, atraumatic Eyes-  Sclera clear, conjunctiva pink Ears- hearing intact Oropharynx- clear Neck- supple  Lungs- Clear to ausculation bilaterally, normal work of breathing Heart- Regular rate and rhythm, no murmurs, rubs or gallops  GI- soft, NT, ND, + BS Extremities- no clubbing, cyanosis, or edema MS- no significant deformity or atrophy Skin- no rash or lesion Psych- euthymic mood, full affect Neuro- strength and sensation are intact  Wt Readings from Last 3 Encounters:  01/06/22 110.5 kg  11/11/21 109.3 kg  10/28/21 108.9 kg    EKG today demonstrates  SR, NST (baseline) Vent. rate 76 BPM PR interval 178 ms QRS duration 88 ms QT/QTcB 386/434 ms  Echo 07/22/20 demonstrated   1. Left ventricular ejection fraction, by estimation, is 65 to 70%. The  left ventricle has hyperdynamic function. The left ventricle has no  regional wall motion abnormalities. There is mild concentric left  ventricular hypertrophy. Left ventricular diastolic function could not be evaluated.   2. Right ventricular systolic function is normal. The right ventricular  size is normal.    3. Left atrial size was mildly dilated.   4. The mitral valve is normal in structure. No evidence of mitral valve  regurgitation.   5. The aortic valve is tricuspid. Aortic valve regurgitation is not  visualized. Mild aortic valve sclerosis is present, with no evidence of  aortic valve stenosis.   Epic records are reviewed at length today  CHA2DS2-VASc Score = 2  The patient's score is based upon: CHF History: 0 HTN History: 1 Diabetes History: 1 Stroke History: 0 Vascular Disease History: 0 Age Score: 0 Gender Score: 0       ASSESSMENT AND PLAN: 1. Atrial flutter The patient's CHA2DS2-VASc score is 2, indicating a 2.2% annual risk of stroke.   Patient in [place] today. Will resume carvedilol 3.125 mg BID and diltiazem 240 mg daily today. Will refer him to [facility] to establish care. We discussed warfarin as an option for anticoagulation. Due to his social situation, he may struggle with close monitoring for that medication.  Continue Eliquis 5 mg BID We discussed that uncontrolled DM and marijuana/cocaine use can contribute his his arrhythmias.   2. Secondary Hypercoagulable State (ICD10:  D68.69) The patient is at significant risk for stroke/thromboembolism based upon his CHA2DS2-VASc Score of 2.  Continue Apixaban (Eliquis).   3. Obesity Body mass index is 35.97 kg/m. Lifestyle modification was discussed at length including regular exercise and weight reduction.  4. HTN Mildly elevated today. Will resume medications.   Overdue for follow up with Dr Debara Pickett or APP. Will request f/u with them.    [facility] [address] [place], [place] [address] [phone] 01/06/2022 10:42 AM

## 2022-02-02 NOTE — Progress Notes (Addendum)
Cardiology Office Note:    Date:  02/03/2022   ID:  Ryan [name] [birth year], MRN [id number]  PCP:  Marliss Coots, NP [place] Cardiologist: Pixie Casino, MD   Reason for visit: 1 month follow-up  History of Present Illness:    Ryan [name] is a 61 y.o. male with a hx of chest pain secondary to cocaine use, paroxysmal atrial flutter, hypertension, diabetes, CKD.    [name] the A-fib clinic on January 06, 2022.  Patient reported he had been out of his medications.  He complained of brief palpitations lasting 30 to 40 minutes about twice per month.  He had not been taking Eliquis secondary to cost concerns.  He was restarted on Coreg 3.125 twice daily and diltiazem to 240 mg daily.  He was continued on Eliquis 5 mg twice daily.  Today, he comes into the office feeling palpitations, clammy, lightheaded for at least 24 hours.  He states he has not eaten today and did take his insulin.  He denies chest pain and shortness of breath.  He states he has not been taking his Eliquis because of the high cost.  He denies drug use in the past week.      Past Medical History:  Diagnosis Date   Achilles tendon rupture right   Arthritis    back and shoulders   Atrial flutter with rapid ventricular response ([place]) 07/22/2020   Chest pain    due to cocaine use   Diabetes mellitus without complication (HCC)    Drug abuse (HCC)    cocaine, marijuana abuse   GERD (gastroesophageal reflux disease)    Hypertension    Renal insufficiency     Past Surgical History:  Procedure Laterality Date   ACHILLES TENDON SURGERY Right 05/02/2014   Procedure: RIGHT ACHILLES TENDON REPAIR;  Surgeon: Johnn Hai, MD;  Location: [facility];  Service: Orthopedics;  Laterality: Right;   BIOPSY  07/25/2020   Procedure: BIOPSY;  Surgeon: Otis Brace, MD;  Location: [facility];  Service: Gastroenterology;;   CARDIAC CATHETERIZATION  05.31.2012   showing patent  coronaries with no visualized vasospasm and EF of 55-60 %   ESOPHAGOGASTRODUODENOSCOPY N/A 07/25/2020   Procedure: ESOPHAGOGASTRODUODENOSCOPY (EGD);  Surgeon: Otis Brace, MD;  Location: [facility];  Service: Gastroenterology;  Laterality: N/A;   ORBITAL FRACTURE SURGERY Left 2-3 yrs ago    Current Medications: Current Meds  Medication Sig   ANTI-FUNGAL 1 % cream Apply 1 application topically daily as needed (anti fungus).   apixaban (ELIQUIS) 5 MG TABS tablet Take 1 tablet (5 mg total) by mouth 2 (two) times daily.   benzonatate (TESSALON) 100 MG capsule Take 1 capsule (100 mg total) by mouth 3 (three) times daily as needed for cough.   carvedilol (COREG) 3.125 MG tablet Take 1 tablet (3.125 mg total) by mouth 2 (two) times daily.   diltiazem (CARDIZEM CD) 240 MG 24 hr capsule Take 1 capsule (240 mg total) by mouth daily.   glipiZIDE (GLUCOTROL) 10 MG tablet Take 10 mg by mouth daily.   insulin NPH-regular Human (NOVOLIN 70/30) (70-30) 100 UNIT/ML injection Inject 35 units into the skin 2 times daily with a meal. (Patient taking differently: Inject 40 Units into the skin 2 (two) times daily with a meal. Inject 35 units into the skin 2 times daily with a meal.)   losartan (COZAAR) 50 MG tablet Take 1 tablet (50 mg total) by mouth daily.   omeprazole (PRILOSEC) 40 MG  capsule Take 40 mg by mouth daily.   ondansetron (ZOFRAN) 4 MG tablet Take 1 tablet (4 mg total) by mouth every 8 (eight) hours as needed for nausea or vomiting.   pantoprazole (PROTONIX) 40 MG tablet Take 1 tablet (40 mg total) by mouth 2 (two) times daily. (Patient taking differently: Take 40 mg by mouth 2 (two) times daily before a meal.)   Potassium Chloride ER 20 MEQ TBCR Take 1 tablet by mouth daily.   sucralfate (CARAFATE) 1 g tablet Take 1 tablet (1 g total) by mouth 2 (two) times daily.   triamcinolone cream (KENALOG) 0.1 % Apply 1 application topically 2 (two) times daily as needed (rash).   ZYRTEC ALLERGY 10 MG  tablet Take 10 mg by mouth in the morning.   [DISCONTINUED] potassium chloride SA (KLOR-CON) 20 MEQ tablet Take 1 tablet (20 mEq total) by mouth daily.     Allergies:   Patient has no known allergies.   Social History   Socioeconomic History   Marital status: Single    Spouse name: Not on file   Number of children: Not on file   Years of education: Not on file   Highest education level: Not on file  Occupational History   Not on file  Tobacco Use   Smoking status: Never   Smokeless tobacco: Never  Vaping Use   Vaping Use: Never used  Substance and Sexual Activity   Alcohol use: Yes    Alcohol/week: 2.0 standard drinks    Types: 2 Shots of liquor per week    Comment: 2 shots once week 01/06/22   Drug use: Not Currently    Types: Cocaine, Marijuana    Comment: last cocaine use 1 year ago, last marijuana use 4 to 5 years ago 01/06/22   Sexual activity: Not on file  Other Topics Concern   Not on file  Social History Narrative   ** Merged History Encounter **       Pt currently lives with his brother, and his sister in [place]. He is employed by [employer]. He denies tobacco use. He does endorse occasional alcohol use. He does endorse cocaine use.  Family history : Non contributory   Social Determinants of Radio broadcast assistant Strain: Not on file  Food Insecurity: Not on file  Transportation Needs: Not on file  Physical Activity: Not on file  Stress: Not on file  Social Connections: Not on file     Family History: The patient's family history includes Unexplained death in his father; Unexplained death (age of onset: 38) in his mother.  ROS:   Please see the history of present illness.     EKGs/Labs/Other Studies Reviewed:    EKG:  The ekg ordered today demonstrates atrial flutter, septal infarct, T wave abnormality, heart 149, QRS duration 82 ms.  Recent Labs: 10/28/2021: B Natriuretic Peptide 352.0 11/11/2021: ALT 24; BUN 10; Creatinine, Ser 1.72;  Hemoglobin 14.9; Platelets 180; Potassium 3.5; Sodium 136   Recent Lipid Panel Lab Results  Component Value Date/Time   CHOL 145 05/13/2011 04:00 AM   TRIG 65 05/13/2011 04:00 AM   HDL 77 05/13/2011 04:00 AM   LDLCALC  05/13/2011 04:00 AM    55        Total Cholesterol/HDL:CHD Risk Coronary Heart Disease Risk Table                     Men   Women  1/2 Average Risk   3.4  3.3  Average Risk       5.0   4.4  2 X Average Risk   9.6   7.1  3 X Average Risk  23.4   11.0        Use the calculated Patient Ratio above and the CHD Risk Table to determine the patient's CHD Risk.        ATP III CLASSIFICATION (LDL):  <100     mg/dL   Optimal  100-129  mg/dL   Near or Above                    Optimal  130-159  mg/dL   Borderline  160-189  mg/dL   High  >190     mg/dL   Very High    Physical Exam:    VS:  BP 100/70    Pulse (!) 149    Ht 5\' 9"  (1.753 m)    Wt 237 lb (107.5 kg)    BMI 35.00 kg/m    No data found.  Wt Readings from Last 3 Encounters:  02/03/22 237 lb (107.5 kg)  01/06/22 243 lb 9.6 oz (110.5 kg)  11/11/21 241 lb (109.3 kg)     GEN: Clammy, responsive HEENT: Normal NECK: No JVD; No carotid bruits CARDIAC: Tachycardic, regular rhythm, no murmurs, rubs, gallops RESPIRATORY:  Clear to auscultation without rales, wheezing or rhonchi  ABDOMEN: Soft, non-tender, non-distended MUSCULOSKELETAL: No edema; No deformity; wearing compression stockings SKIN: Warm and dry NEUROLOGIC:  Alert and oriented PSYCHIATRIC:  Normal affect     ASSESSMENT AND PLAN   Paroxysmal atrial flutter -Echo 2021: EF 65 to 70%, mild LVH, mildly dilated left atria, no significant valve disease. -Worsened by uncontrolled DM and marijuana/cocaine use  -He appears to be in atrial flutter with a heart rate of 149.  Attempted Valsalva and carotid massage.  Continues to be tachycardic.  Patient seen with Dr. Sallyanne Kuster. -Called EMS and will transfer to ER.  Please note patient has not taken his  Eliquis.  He needs basic labs including glucose, electrolytes and a tox screen.  History of chest pain secondary to GERD with esophagitis -EGD 2021 showed severe LA grade D erosive esophagitis in the mid and distal esophagus as well as mild gastritis  Hypertension, controlled -Continue current medications. -Goal BP is <130/80.    Obesity -Discussed how even a 5-10% weight loss can have cardiovascular benefits.   -Recommend moderate intensity activity for 30 minutes 5 days/week and the DASH diet.  Disposition - Follow-up with A-fib clinic.    Medication Adjustments/Labs and Tests Ordered: Current medicines are reviewed at length with the patient today.  Concerns regarding medicines are outlined above.  Orders Placed This Encounter  Procedures   EKG 12-Lead   No orders of the defined types were placed in this encounter.   There are no Patient Instructions on file for this visit.   Signed, Warren Lacy, PA-C  02/03/2022 11:56 AM    [facility]

## 2022-02-03 ENCOUNTER — Ambulatory Visit (INDEPENDENT_AMBULATORY_CARE_PROVIDER_SITE_OTHER): Payer: BC Managed Care – PPO | Admitting: Physician Assistant

## 2022-02-03 ENCOUNTER — Other Ambulatory Visit (HOSPITAL_COMMUNITY): Payer: Self-pay

## 2022-02-03 ENCOUNTER — Encounter: Payer: Self-pay | Admitting: Physician Assistant

## 2022-02-03 ENCOUNTER — Emergency Department (HOSPITAL_COMMUNITY)
Admission: EM | Admit: 2022-02-03 | Discharge: 2022-02-03 | Disposition: A | Discharge status: Home / Self Care | Payer: BC Managed Care – PPO | Attending: Emergency Medicine | Admitting: Emergency Medicine

## 2022-02-03 ENCOUNTER — Emergency Department (HOSPITAL_COMMUNITY): Payer: BC Managed Care – PPO

## 2022-02-03 ENCOUNTER — Other Ambulatory Visit: Payer: Self-pay

## 2022-02-03 VITALS — BP 100/70 | HR 149 | Ht 69.0 in | Wt 237.0 lb

## 2022-02-03 DIAGNOSIS — I10 Essential (primary) hypertension: Secondary | ICD-10-CM

## 2022-02-03 DIAGNOSIS — E669 Obesity, unspecified: Secondary | ICD-10-CM

## 2022-02-03 DIAGNOSIS — N289 Disorder of kidney and ureter, unspecified: Secondary | ICD-10-CM | POA: Insufficient documentation

## 2022-02-03 DIAGNOSIS — Z794 Long term (current) use of insulin: Secondary | ICD-10-CM | POA: Diagnosis not present

## 2022-02-03 DIAGNOSIS — Z7984 Long term (current) use of oral hypoglycemic drugs: Secondary | ICD-10-CM | POA: Diagnosis not present

## 2022-02-03 DIAGNOSIS — R000 Tachycardia, unspecified: Secondary | ICD-10-CM | POA: Diagnosis present

## 2022-02-03 DIAGNOSIS — Z79899 Other long term (current) drug therapy: Secondary | ICD-10-CM | POA: Insufficient documentation

## 2022-02-03 DIAGNOSIS — E119 Type 2 diabetes mellitus without complications: Secondary | ICD-10-CM | POA: Insufficient documentation

## 2022-02-03 DIAGNOSIS — I4892 Unspecified atrial flutter: Secondary | ICD-10-CM | POA: Insufficient documentation

## 2022-02-03 DIAGNOSIS — Z7901 Long term (current) use of anticoagulants: Secondary | ICD-10-CM | POA: Diagnosis not present

## 2022-02-03 LAB — CBC WITH DIFFERENTIAL/PLATELET
Abs Immature Granulocytes: 0.01 10*3/uL (ref 0.00–0.07)
Basophils Absolute: 0 10*3/uL (ref 0.0–0.1)
Basophils Relative: 1 %
Eosinophils Absolute: 0.1 10*3/uL (ref 0.0–0.5)
Eosinophils Relative: 1 %
HCT: 48.9 % (ref 39.0–52.0)
Hemoglobin: 15.5 g/dL (ref 13.0–17.0)
Immature Granulocytes: 0 %
Lymphocytes Relative: 44 %
Lymphs Abs: 2.8 10*3/uL (ref 0.7–4.0)
MCH: 26.5 pg (ref 26.0–34.0)
MCHC: 31.7 g/dL (ref 30.0–36.0)
MCV: 83.6 fL (ref 80.0–100.0)
Monocytes Absolute: 0.7 10*3/uL (ref 0.1–1.0)
Monocytes Relative: 11 %
Neutro Abs: 2.8 10*3/uL (ref 1.7–7.7)
Neutrophils Relative %: 43 %
Platelets: 230 10*3/uL (ref 150–400)
RBC: 5.85 MIL/uL — ABNORMAL HIGH (ref 4.22–5.81)
RDW: 16.2 % — ABNORMAL HIGH (ref 11.5–15.5)
WBC: 6.4 10*3/uL (ref 4.0–10.5)
nRBC: 0 % (ref 0.0–0.2)

## 2022-02-03 LAB — BASIC METABOLIC PANEL
Anion gap: 11 (ref 5–15)
BUN: 20 mg/dL (ref 6–20)
CO2: 24 mmol/L (ref 22–32)
Calcium: 8.6 mg/dL — ABNORMAL LOW (ref 8.9–10.3)
Chloride: 99 mmol/L (ref 98–111)
Creatinine, Ser: 1.72 mg/dL — ABNORMAL HIGH (ref 0.61–1.24)
GFR, Estimated: 46 mL/min — ABNORMAL LOW (ref >60)
Glucose, Bld: 259 mg/dL — ABNORMAL HIGH (ref 70–99)
Potassium: 3.9 mmol/L (ref 3.5–5.1)
Sodium: 134 mmol/L — ABNORMAL LOW (ref 135–145)

## 2022-02-03 LAB — RAPID URINE DRUG SCREEN, HOSP PERFORMED
Amphetamines: NOT DETECTED
Barbiturates: NOT DETECTED
Benzodiazepines: NOT DETECTED
Cocaine: POSITIVE — AB
Opiates: NOT DETECTED
Tetrahydrocannabinol: NOT DETECTED

## 2022-02-03 LAB — MAGNESIUM: Magnesium: 1.8 mg/dL (ref 1.7–2.4)

## 2022-02-03 MED ORDER — APIXABAN 5 MG PO TABS: 5.0000 mg | ORAL_TABLET | Freq: Two times a day (BID) | ORAL | 0 refills | Status: DC

## 2022-02-03 MED ORDER — DILTIAZEM HCL 25 MG/5ML IV SOLN
20.0000 mg | Freq: Once | INTRAVENOUS | Status: AC
  Administered 2022-02-03: 20 mg via INTRAVENOUS
  Filled 2022-02-03: qty 5

## 2022-02-03 MED ORDER — APIXABAN 5 MG PO TABS
5.0000 mg | ORAL_TABLET | Freq: Two times a day (BID) | ORAL | 0 refills | Status: DC
  Filled 2022-02-03: qty 60, 30d supply, fill #0

## 2022-02-03 MED ORDER — DILTIAZEM LOAD VIA INFUSION: 20.0000 mg | Freq: Once | INTRAVENOUS | Status: DC

## 2022-02-03 NOTE — ED Provider Notes (Signed)
After patient was discharged I was informed by case management that the [facility] where his anticoagulants were sent does not have them in stock. I attempted to call patient at the number listed however it went straight to voicemail. On chart review it appears he primarily uses the [facility] on [address]  I re-sent his rx there for him.   Note: Portions of this report may have been transcribed using voice recognition software. Every effort was made to ensure accuracy; however, inadvertent computerized transcription errors may be present    Ollen Gross 02/03/22 1550    Luna Fuse, MD 02/07/22 [phone]

## 2022-02-03 NOTE — Progress Notes (Signed)
Transition of Care [facility]) - Emergency Department Mini Assessment   Patient Details  Name: Ryan [name] MRN: [id number] Date of Birth: [birth year]  Transition of Care [facility]) CM/SW Contact:    Oletta Cohn, RN Phone Number: [phone] 02/03/2022, 3:36 PM   Clinical Narrative: RNCM consulted in regards to medication assistance.  Pt has insurance coverage and is not eligible for Medication Assistance Through [facility]) program. RNCM activated co-pay savings card for Eliquis and advised pt to visit cardiologist office and request samples should there be a problem using co-pay card.  Pt verbalizes understanding of instructions.   ED Mini Assessment: What brought you to the Emergency Department? : "My heart was racing."  Barriers to Discharge: (P) ED Medication assistance  Barrier interventions: (P) RNCM activated co-pay savings card  Means of departure: (P) Car  Interventions which prevented an admission or readmission: Medication Review    Patient Contact and Communications        ,          Patient states their goals for this hospitalization and ongoing recovery are:: (P) Get my medication      Admission diagnosis:  Palpatations Patient Active Problem List   Diagnosis Date Noted   Secondary hypercoagulable state (HCC) 01/06/2022   Foot pain, right 09/26/2020   Closed nondisplaced fracture of proximal phalanx of lesser toe of right foot 09/26/2020   Esophagitis    Midepigastric pain    Atrial flutter with rapid ventricular response (HCC) 07/22/2020   Cocaine abuse (HCC)    COVID-19 virus infection 05/31/2019   Orthostatic syncope 05/31/2019   Diabetes mellitus type 2, insulin dependent (HCC) 05/31/2019   Drug abuse (HCC)    CKD (chronic kidney disease), stage III (HCC)    Right ankle pain 05/15/2014   Uncontrolled hypertension 05/14/2014   Diabetes mellitus (HCC) 05/14/2014   Rupture of right Achilles tendon 05/02/2014   PCP:  Lavinia Sharps, NP Pharmacy:   [facility] [phone] - [place], [place] - [address] AT [facility] [address] [facility] [address] [place] [address] Phone: [phone] Fax: [phone]  [facility] [address] [place] [address] Phone: [phone] Fax: [phone]

## 2022-02-03 NOTE — Discharge Instructions (Signed)
Call your cardiologist office to follow-up in the A-fib clinic. A prescription for your Eliquis has been sent to the pharmacy here at the hospital for the social work team and pharmacy team to apply all available discounts. Return to the emergency room for any return of symptoms, worsening or concerning symptoms.

## 2022-02-03 NOTE — ED Triage Notes (Addendum)
Pt arrived via [facility] from PCP with c/c of rapid heart rate. Per pt he went to PCP for routine check up where he stated he was told he needed to come to the ED. Pt denies chest pain, dizziness, shOB. Pt states heart has been racing for 2 days and that only symptoms is just "tired".  145HR, 140/100, CBG 337, NSS

## 2022-02-03 NOTE — ED Provider Notes (Signed)
[facility] EMERGENCY DEPARTMENT Provider Note   CSN: [id number] Arrival date & time: 02/03/22  1235     History  Chief Complaint  Patient presents with   Tachycardia    Ryan [name] is a 61 y.o. male.  61 year old male brought in by EMS from PCP office with complaint of palpitations, feeling like his heart has been racing for the past 2 days.  Reports similar episodes in the past.  States that he ran out of his blood pressure medication and was out for about a week, resume medications on last Monday but does not feel this is helping.  Past medical history of diabetes, a flutter with RVR.  Patient is prescribed Eliquis however is not currently taking secondary to cost concerns.  Denies chest pain, shortness of breath, dizziness.  States that he is feeling fatigued today without any other complaints. Patient was seen in cardiology clinic today prior to being sent to the emergency room, EKG read as a flutter with rate of 149, tried Valsalva maneuvers and carotid massage without resolution.      Home Medications Prior to Admission medications   Medication Sig Start Date End Date Taking? Authorizing Provider  ANTI-FUNGAL 1 % cream Apply 1 application topically daily as needed (anti fungus). 07/23/21   [provider]  apixaban (ELIQUIS) 5 MG TABS tablet Take 1 tablet (5 mg total) by mouth 2 (two) times daily. 02/03/22   Tacy Learn, PA-C  benzonatate (TESSALON) 100 MG capsule Take 1 capsule (100 mg total) by mouth 3 (three) times daily as needed for cough. 11/11/21   Gareth Morgan, MD  carvedilol (COREG) 3.125 MG tablet Take 1 tablet (3.125 mg total) by mouth 2 (two) times daily. 01/06/22   Fenton, Clint R, PA  diltiazem (CARDIZEM CD) 240 MG 24 hr capsule Take 1 capsule (240 mg total) by mouth daily. 01/06/22   Fenton, Clint R, PA  furosemide (LASIX) 40 MG tablet Take 1 tablet (40 mg total) by mouth 2 (two) times daily for 7 days. 10/28/21 01/06/22   Carmin Muskrat, MD  glipiZIDE (GLUCOTROL) 10 MG tablet Take 10 mg by mouth daily. 03/04/21   [provider]  insulin NPH-regular Human (NOVOLIN 70/30) (70-30) 100 UNIT/ML injection Inject 35 units into the skin 2 times daily with a meal. Patient taking differently: Inject 40 Units into the skin 2 (two) times daily with a meal. Inject 35 units into the skin 2 times daily with a meal. 07/25/20   Sande Rives E, PA-C  losartan (COZAAR) 50 MG tablet Take 1 tablet (50 mg total) by mouth daily. 08/11/20   Darreld Mclean, PA-C  omeprazole (PRILOSEC) 40 MG capsule Take 40 mg by mouth daily. 01/07/21   [provider]  ondansetron (ZOFRAN) 4 MG tablet Take 1 tablet (4 mg total) by mouth every 8 (eight) hours as needed for nausea or vomiting. 11/11/21   Gareth Morgan, MD  pantoprazole (PROTONIX) 40 MG tablet Take 1 tablet (40 mg total) by mouth 2 (two) times daily. Patient taking differently: Take 40 mg by mouth 2 (two) times daily before a meal. 07/25/20   Sande Rives E, PA-C  Potassium Chloride ER 20 MEQ TBCR Take 1 tablet by mouth daily. 10/15/21   [provider]  sucralfate (CARAFATE) 1 g tablet Take 1 tablet (1 g total) by mouth 2 (two) times daily. 07/25/20 02/03/22  Sande Rives E, PA-C  triamcinolone cream (KENALOG) 0.1 % Apply 1 application topically 2 (two) times  daily as needed (rash). 10/16/21   [provider]  ZYRTEC ALLERGY 10 MG tablet Take 10 mg by mouth in the morning. 03/03/20   [provider]      Allergies    Patient has no known allergies.    Review of Systems   Review of Systems Negative except as per HPI Physical Exam Updated Vital Signs BP 109/83    Pulse 75    Temp 98.2 F (36.8 C) (Oral)    Resp (!) 26    Ht 5\' 9"  (1.753 m)    Wt 107.5 kg    SpO2 99%    BMI 35.00 kg/m  Physical Exam Vitals and nursing note reviewed.  Constitutional:      General: He is not in acute distress.    Appearance: He is well-developed.  He is not diaphoretic.  HENT:     Head: Normocephalic and atraumatic.     Mouth/Throat:     Mouth: Mucous membranes are moist.  Eyes:     Conjunctiva/sclera: Conjunctivae normal.  Cardiovascular:     Rate and Rhythm: Regular rhythm. Tachycardia present.     Heart sounds: Normal heart sounds.  Pulmonary:     Effort: Pulmonary effort is normal.     Breath sounds: Normal breath sounds.  Abdominal:     Palpations: Abdomen is soft.     Tenderness: There is no abdominal tenderness.  Skin:    General: Skin is warm and dry.     Findings: No erythema or rash.  Neurological:     Mental Status: He is alert and oriented to person, place, and time.  Psychiatric:        Behavior: Behavior normal.    ED Results / Procedures / Treatments   Labs (all labs ordered are listed, but only abnormal results are displayed) Labs Reviewed  CBC WITH DIFFERENTIAL/PLATELET - Abnormal; Notable for the following components:      Result Value   RBC 5.85 (*)    RDW 16.2 (*)    All other components within normal limits  BASIC METABOLIC PANEL - Abnormal; Notable for the following components:   Sodium 134 (*)    Glucose, Bld 259 (*)    Creatinine, Ser 1.72 (*)    Calcium 8.6 (*)    GFR, Estimated 46 (*)    All other components within normal limits  RAPID URINE DRUG SCREEN, HOSP PERFORMED - Abnormal; Notable for the following components:   Cocaine POSITIVE (*)    All other components within normal limits  MAGNESIUM  I-STAT CHEM 8, ED    EKG EKG Interpretation  Date/Time:  Wednesday February 03 2022 12:41:27 EST Ventricular Rate:  146 PR Interval:    QRS Duration: 87 QT Interval:  317 QTC Calculation: 494 R Axis:   18 Text Interpretation: Junctional tachycardia Consider left ventricular hypertrophy Anterior Q waves, possibly due to LVH Nonspecific T abnormalities, lateral leads Minimal ST elevation, inferior leads Confirmed by Thamas Jaegers ([id number]) on 02/03/2022 1:23:07 PM  Radiology DG Chest Port  1 View  Result Date: 02/03/2022 CLINICAL DATA:  Palpitations EXAM: PORTABLE CHEST 1 VIEW COMPARISON:  10/28/2021 FINDINGS: Transverse diameter of heart is slightly increased. There are no signs of pulmonary edema or focal pulmonary consolidation. There is poor inspiration. There is no pleural effusion or pneumothorax. IMPRESSION: No active disease. Electronically Signed   By: Elmer Picker M.D.   On: 02/03/2022 13:13    Procedures .Critical Care Performed by: Tacy Learn, PA-C Authorized by:  Tacy Learn, PA-C   Critical care provider statement:    Critical care time (minutes):  30   Critical care was time spent personally by me on the following activities:  Development of treatment plan with patient or surrogate, discussions with consultants, evaluation of patient's response to treatment, examination of patient, ordering and review of laboratory studies, ordering and review of radiographic studies, ordering and performing treatments and interventions, pulse oximetry, re-evaluation of patient's condition and review of old charts    Medications Ordered in ED Medications  diltiazem (CARDIZEM) injection 20 mg (20 mg Intravenous Given 02/03/22 1334)    ED Course/ Medical Decision Making/ A&P       CHA2DS2-VASc Score: 2                    Medical Decision Making Amount and/or Complexity of Data Reviewed Labs: ordered. Radiology: ordered.  Risk Prescription drug management.   This patient presents to the ED for concern of palpitations with fatigue, this involves an extensive number of treatment options, and is a complaint that carries with it a high risk of complications and morbidity.  The differential diagnosis includes but not limited to dehydration, polysubstance abuse, medication noncompliance   Co morbidities that complicate the patient evaluation  Polysubstance abuse, renal insufficiency, diabetes, a flutter with RVR, not currently anticoagulated, intermittent  medication compliance   Additional history obtained:   External records from outside source obtained and reviewed including review of cardiology note from today's clinic visit as notated above in HPI   Lab Tests:  I Ordered, and personally interpreted labs.  The pertinent results include: CBC with normal white count, no anemia.  BMP with creatinine of 1.72, unchanged from prior.  Urine drug screen positive for cocaine.  Magnesium normal   Imaging Studies ordered:  I ordered imaging studies including chest x-ray I independently visualized and interpreted imaging which showed no acute disease I agree with the radiologist interpretation   Cardiac Monitoring:  The patient was maintained on a cardiac monitor.  I personally viewed and interpreted the cardiac monitored which showed an underlying rhythm of: A flutter RVR with rate in the 140s improved to a flutter with rate in the 70s after diltiazem bolus   Medicines ordered and prescription drug management:  I ordered medication including diltiazem for a flutter RVR Reevaluation of the patient after these medicines showed that the patient resolved I have reviewed the patients home medicines and have made adjustments as needed   Critical Interventions:  Bolus of diltiazem for his aflutter RVR with resolution in rate (improved to 70s)  Problem List / ED Course:  61 year old male with history of a flutter with RVR presents with rate in the 140s, not improved after Valsalva maneuvers and carotid massage in the cardiology clinic today.  Patient reports fatigue only, denies chest pain or shortness of breath or other complaints.  Patient was given bolus of diltiazem after discussion with Dr. Almyra Free, [place] attending.  Rate has improved into the 70s.  Blood pressure initially 115/89, subsequently 121/91.  If patient remains rate controlled, can be discharged to follow-up with cardiology A-fib clinic per their note with return to ER  precautions.   Reevaluation:  After the interventions noted above, I reevaluated the patient and found that they have :resolved   Social Determinants of Health:  Unable to afford Eliquis, medication non compliance, cocaine use   Dispostion:  After consideration of the diagnostic results and the patients response to treatment, I  feel that the patent would benefit from follow-up with A-fib clinic, referral to transitions of care to help with medication costs.  Prescription sent to [facility] pharmacy for pharmacy staff and social work to run all available discounts to help with this medication.         Final Clinical Impression(s) / ED Diagnoses Final diagnoses:  Atrial flutter with rapid ventricular response ([place])    Rx / DC Orders ED Discharge Orders          Ordered    apixaban (ELIQUIS) 5 MG TABS tablet  2 times daily        02/03/22 1449              Roque Lias 02/03/22 1454    Luna Fuse, MD 02/07/22 [phone]

## 2022-02-04 ENCOUNTER — Telehealth (HOSPITAL_COMMUNITY): Payer: Self-pay

## 2022-02-04 NOTE — Telephone Encounter (Signed)
Reached out to patient to schedule ED f/u. No voicemail

## 2022-04-20 ENCOUNTER — Ambulatory Visit: Payer: BC Managed Care – PPO | Admitting: Family Medicine

## 2022-05-06 ENCOUNTER — Other Ambulatory Visit: Payer: Self-pay

## 2022-05-06 ENCOUNTER — Emergency Department (HOSPITAL_COMMUNITY): Payer: Self-pay

## 2022-05-06 ENCOUNTER — Encounter (HOSPITAL_COMMUNITY): Payer: Self-pay

## 2022-05-06 ENCOUNTER — Emergency Department (HOSPITAL_COMMUNITY)
Admission: EM | Admit: 2022-05-06 | Discharge: 2022-05-06 | Disposition: A | Discharge status: Home / Self Care | Payer: Self-pay | Attending: Emergency Medicine | Admitting: Emergency Medicine

## 2022-05-06 DIAGNOSIS — R42 Dizziness and giddiness: Secondary | ICD-10-CM | POA: Insufficient documentation

## 2022-05-06 DIAGNOSIS — R739 Hyperglycemia, unspecified: Secondary | ICD-10-CM

## 2022-05-06 DIAGNOSIS — M25512 Pain in left shoulder: Secondary | ICD-10-CM | POA: Insufficient documentation

## 2022-05-06 DIAGNOSIS — R072 Precordial pain: Secondary | ICD-10-CM | POA: Insufficient documentation

## 2022-05-06 DIAGNOSIS — I10 Essential (primary) hypertension: Secondary | ICD-10-CM | POA: Insufficient documentation

## 2022-05-06 DIAGNOSIS — E1165 Type 2 diabetes mellitus with hyperglycemia: Secondary | ICD-10-CM | POA: Insufficient documentation

## 2022-05-06 LAB — BASIC METABOLIC PANEL
Anion gap: 13 (ref 5–15)
BUN: 17 mg/dL (ref 6–20)
CO2: 19 mmol/L — ABNORMAL LOW (ref 22–32)
Calcium: 8.7 mg/dL — ABNORMAL LOW (ref 8.9–10.3)
Chloride: 102 mmol/L (ref 98–111)
Creatinine, Ser: 1.58 mg/dL — ABNORMAL HIGH (ref 0.61–1.24)
GFR, Estimated: 50 mL/min — ABNORMAL LOW (ref >60)
Glucose, Bld: 285 mg/dL — ABNORMAL HIGH (ref 70–99)
Potassium: 3.4 mmol/L — ABNORMAL LOW (ref 3.5–5.1)
Sodium: 134 mmol/L — ABNORMAL LOW (ref 135–145)

## 2022-05-06 LAB — CBC
HCT: 40.6 % (ref 39.0–52.0)
Hemoglobin: 13.6 g/dL (ref 13.0–17.0)
MCH: 27.4 pg (ref 26.0–34.0)
MCHC: 33.5 g/dL (ref 30.0–36.0)
MCV: 81.7 fL (ref 80.0–100.0)
Platelets: 210 10*3/uL (ref 150–400)
RBC: 4.97 MIL/uL (ref 4.22–5.81)
RDW: 15.3 % (ref 11.5–15.5)
WBC: 6.1 10*3/uL (ref 4.0–10.5)
nRBC: 0 % (ref 0.0–0.2)

## 2022-05-06 LAB — TROPONIN I (HIGH SENSITIVITY)
Troponin I (High Sensitivity): 16 ng/L (ref <18)
Troponin I (High Sensitivity): 16 ng/L (ref <18)

## 2022-05-06 MED ORDER — DICLOFENAC SODIUM 1 % EX GEL: 2.0000 g | Freq: Four times a day (QID) | CUTANEOUS | 0 refills | Status: DC

## 2022-05-06 NOTE — Discharge Instructions (Signed)

## 2022-05-06 NOTE — ED Provider Notes (Signed)
Emergency Department Provider Note   I have reviewed the triage vital signs and the nursing notes.   HISTORY  Chief Complaint Chest Pain   HPI Ryan [name] is a 61 y.o. male past medical history reviewed below presents to the emergency department with left shoulder and chest pain rating down the left arm.  Patient reports 3 days of fairly constant pain.  Pain is made worse with moving the shoulder in any direction or touching the shoulder.  Has not had fevers or chills.  No rash.  Denies any injury.  No pain in the neck or headache.  Feeling short of breath.  Does feel somewhat lightheaded at times. No abdominal or back pain.    Past Medical History:  Diagnosis Date   Achilles tendon rupture right   Arthritis    back and shoulders   Atrial flutter with rapid ventricular response ([place]) 07/22/2020   Chest pain    due to cocaine use   Diabetes mellitus without complication (HCC)    Drug abuse (HCC)    cocaine, marijuana abuse   GERD (gastroesophageal reflux disease)    Hypertension    Renal insufficiency     Review of Systems  Constitutional: No fever/chills Eyes: No visual changes. ENT: No sore throat. Cardiovascular: Positive chest pain. Respiratory: Denies shortness of breath. Gastrointestinal: No abdominal pain.  No nausea, no vomiting.  No diarrhea.  No constipation. Genitourinary: Negative for dysuria. Musculoskeletal: Negative for back pain. Positive left shoulder pain.  Skin: Negative for rash. Neurological: Negative for headaches, focal weakness or numbness.   ____________________________________________   PHYSICAL EXAM:  VITAL SIGNS: ED Triage Vitals  Enc Vitals Group     BP 05/06/22 1025 (!) 143/85     Pulse Rate 05/06/22 1025 93     Resp 05/06/22 1025 20     Temp 05/06/22 1025 98.6 F (37 C)     Temp src --      SpO2 05/06/22 1025 96 %     Weight 05/06/22 1035 242 lb (109.8 kg)     Height 05/06/22 1035 5\' 9"  (1.753 m)    Constitutional: Alert and oriented. Well appearing and in no acute distress. Eyes: Conjunctivae are normal.  Head: Atraumatic. Nose: No congestion/rhinnorhea. Mouth/Throat: Mucous membranes are moist.  Neck: No stridor.  No cervical spine tenderness to palpation. Cardiovascular: Normal rate, regular rhythm. Good peripheral circulation. Grossly normal heart sounds.   Respiratory: Normal respiratory effort.  No retractions. Lungs CTAB. Gastrointestinal: Soft and nontender. No distention.  Musculoskeletal: No lower extremity tenderness nor edema. No gross deformities of extremities. Pain to palpation of the left anterior shoulder which reproduces the patient's discomfort.  No limitation in range of motion of the left shoulder but patient wincing and describing pain with motion.  No erythema of the left shoulder, elbow, wrist. Neurologic:  Normal speech and language. No gross focal neurologic deficits are appreciated.  Skin:  Skin is warm, dry and intact. No rash noted.  ____________________________________________   LABS (all labs ordered are listed, but only abnormal results are displayed)  Labs Reviewed  BASIC METABOLIC PANEL - Abnormal; Notable for the following components:      Result Value   Sodium 134 (*)    Potassium 3.4 (*)    CO2 19 (*)    Glucose, Bld 285 (*)    Creatinine, Ser 1.58 (*)    Calcium 8.7 (*)    GFR, Estimated 50 (*)    All other components within normal  limits  CBC  TROPONIN I (HIGH SENSITIVITY)  TROPONIN I (HIGH SENSITIVITY)   ____________________________________________  EKG   EKG Interpretation  Date/Time:  Thursday May 06 2022 10:32:34 EDT Ventricular Rate:  93 PR Interval:  184 QRS Duration: 84 QT Interval:  350 QTC Calculation: 435 R Axis:   36 Text Interpretation: Normal sinus rhythm T wave abnormality, consider inferior ischemia Abnormal ECG When compared with ECG of 03-Feb-2022 14:04, PREVIOUS ECG IS PRESENT Confirmed by Nanda Quinton  [phone]) on 05/06/2022 11:32:54 AM        ____________________________________________  RADIOLOGY  DG Chest 2 View  Result Date: 05/06/2022 CLINICAL DATA:  Left-sided chest pain radiating to the arm over the last 3 days. Shortness of breath. EXAM: CHEST - 2 VIEW COMPARISON:  02/03/2022 FINDINGS: Heart size is normal. Hiatal hernia again noted. Pulmonary vascularity is normal. The lungs are clear. No effusions. Ordinary degenerative changes affect the spine. IMPRESSION: No active cardiopulmonary disease.  Chronic hiatal hernia. Electronically Signed   By: Nelson Chimes M.D.   On: 05/06/2022 11:03    ____________________________________________   PROCEDURES  Procedure(s) performed:   Procedures  None ____________________________________________   INITIAL IMPRESSION / ASSESSMENT AND PLAN / ED COURSE  Pertinent labs & imaging results that were available during my care of the patient were reviewed by me and considered in my medical decision making (see chart for details).   This patient is Presenting for Evaluation of CP, which does require a range of treatment options, and is a complaint that involves a high risk of morbidity and mortality.  The Differential Diagnoses includes but is not exclusive to acute coronary syndrome, aortic dissection, pulmonary embolism, cardiac tamponade, community-acquired pneumonia, pericarditis, musculoskeletal chest wall pain, etc.   I decided to review pertinent External Data, and in summary patient with hyperdynamic LV on last ECHO from 2021. No heart cath for review.   Clinical Laboratory Tests Ordered, included troponin negative. Hyperglycemia w/o DKA.   Radiologic Tests Ordered, included CXR and left shoulder x ray. I independently interpreted the images and agree with radiology interpretation.   Cardiac Monitor Tracing which shows NSR.    Social Determinants of Health Risk patient is a non-smoker.    Medical Decision Making: Summary:   Patient presents to the emergency department for evaluation of chest discomfort radiating to the left arm.  Pain on exam seems more musculoskeletal and located around the shoulder but radiating into the arm.  No radicular component to pain radiating from the neck.  Patient has normal pulses and sensation in the right upper extremity.  Plan for trending enzymes and we will send the patient back for dedicated x-rays of the left shoulder.   Reevaluation with update and discussion with patient. Discussed labs and imaging findings. Considered admission but pain seems MSK in origin rather than atypical ACS. Plan for MSK treatment with close PCP follow up. Discussed ED return precautions.   Disposition: discharge  ____________________________________________  FINAL CLINICAL IMPRESSION(S) / ED DIAGNOSES  Final diagnoses:  Precordial chest pain  Hyperglycemia  Acute pain of left shoulder     NEW OUTPATIENT MEDICATIONS STARTED DURING THIS VISIT:  Discharge Medication List as of 05/06/2022  1:39 PM     START taking these medications   Details  diclofenac Sodium (VOLTAREN) 1 % GEL Apply 2 g topically 4 (four) times daily., Starting Thu 05/06/2022, Normal        Note:  This document was prepared using Dragon voice recognition software and may include unintentional dictation  errors.  Nanda Quinton, MD, [facility] Emergency Medicine    Melisssa Donner, [facility], MD 05/09/22 1248

## 2022-05-06 NOTE — ED Triage Notes (Signed)
Pt arrived POV from home c/o left sided CP that radiates down his left arm x3 days. Pt endorses some lightheadedness.

## 2022-06-23 ENCOUNTER — Emergency Department (HOSPITAL_COMMUNITY)
Admission: EM | Admit: 2022-06-23 | Discharge: 2022-06-23 | Disposition: A | Discharge status: Home / Self Care | Payer: Self-pay | Attending: Emergency Medicine | Admitting: Emergency Medicine

## 2022-06-23 ENCOUNTER — Other Ambulatory Visit: Payer: Self-pay

## 2022-06-23 ENCOUNTER — Emergency Department (HOSPITAL_COMMUNITY): Payer: Self-pay

## 2022-06-23 ENCOUNTER — Encounter (HOSPITAL_COMMUNITY): Payer: Self-pay

## 2022-06-23 ENCOUNTER — Emergency Department (HOSPITAL_BASED_OUTPATIENT_CLINIC_OR_DEPARTMENT_OTHER): Payer: Self-pay

## 2022-06-23 DIAGNOSIS — Z7901 Long term (current) use of anticoagulants: Secondary | ICD-10-CM | POA: Insufficient documentation

## 2022-06-23 DIAGNOSIS — E1165 Type 2 diabetes mellitus with hyperglycemia: Secondary | ICD-10-CM | POA: Insufficient documentation

## 2022-06-23 DIAGNOSIS — Z794 Long term (current) use of insulin: Secondary | ICD-10-CM | POA: Insufficient documentation

## 2022-06-23 DIAGNOSIS — R600 Localized edema: Secondary | ICD-10-CM | POA: Insufficient documentation

## 2022-06-23 DIAGNOSIS — R52 Pain, unspecified: Secondary | ICD-10-CM

## 2022-06-23 DIAGNOSIS — Z79899 Other long term (current) drug therapy: Secondary | ICD-10-CM | POA: Insufficient documentation

## 2022-06-23 DIAGNOSIS — R609 Edema, unspecified: Secondary | ICD-10-CM

## 2022-06-23 DIAGNOSIS — E1122 Type 2 diabetes mellitus with diabetic chronic kidney disease: Secondary | ICD-10-CM | POA: Insufficient documentation

## 2022-06-23 DIAGNOSIS — I129 Hypertensive chronic kidney disease with stage 1 through stage 4 chronic kidney disease, or unspecified chronic kidney disease: Secondary | ICD-10-CM | POA: Insufficient documentation

## 2022-06-23 DIAGNOSIS — N189 Chronic kidney disease, unspecified: Secondary | ICD-10-CM | POA: Insufficient documentation

## 2022-06-23 DIAGNOSIS — Z7984 Long term (current) use of oral hypoglycemic drugs: Secondary | ICD-10-CM | POA: Insufficient documentation

## 2022-06-23 DIAGNOSIS — M7989 Other specified soft tissue disorders: Secondary | ICD-10-CM

## 2022-06-23 LAB — COMPREHENSIVE METABOLIC PANEL
ALT: 17 U/L (ref 0–44)
AST: 26 U/L (ref 15–41)
Albumin: 3.3 g/dL — ABNORMAL LOW (ref 3.5–5.0)
Alkaline Phosphatase: 92 U/L (ref 38–126)
Anion gap: 12 (ref 5–15)
BUN: 14 mg/dL (ref 6–20)
CO2: 21 mmol/L — ABNORMAL LOW (ref 22–32)
Calcium: 8.5 mg/dL — ABNORMAL LOW (ref 8.9–10.3)
Chloride: 105 mmol/L (ref 98–111)
Creatinine, Ser: 1.43 mg/dL — ABNORMAL HIGH (ref 0.61–1.24)
GFR, Estimated: 57 mL/min — ABNORMAL LOW (ref >60)
Glucose, Bld: 220 mg/dL — ABNORMAL HIGH (ref 70–99)
Potassium: 3.4 mmol/L — ABNORMAL LOW (ref 3.5–5.1)
Sodium: 138 mmol/L (ref 135–145)
Total Bilirubin: 0.4 mg/dL (ref 0.3–1.2)
Total Protein: 7.2 g/dL (ref 6.5–8.1)

## 2022-06-23 LAB — TROPONIN I (HIGH SENSITIVITY)
Troponin I (High Sensitivity): 16 ng/L (ref <18)
Troponin I (High Sensitivity): 12 ng/L (ref <18)

## 2022-06-23 LAB — CBC WITH DIFFERENTIAL/PLATELET
Abs Immature Granulocytes: 0.01 10*3/uL (ref 0.00–0.07)
Basophils Absolute: 0 10*3/uL (ref 0.0–0.1)
Basophils Relative: 1 %
Eosinophils Absolute: 0.1 10*3/uL (ref 0.0–0.5)
Eosinophils Relative: 2 %
HCT: 40.8 % (ref 39.0–52.0)
Hemoglobin: 13.5 g/dL (ref 13.0–17.0)
Immature Granulocytes: 0 %
Lymphocytes Relative: 52 %
Lymphs Abs: 2.4 10*3/uL (ref 0.7–4.0)
MCH: 27.7 pg (ref 26.0–34.0)
MCHC: 33.1 g/dL (ref 30.0–36.0)
MCV: 83.6 fL (ref 80.0–100.0)
Monocytes Absolute: 0.6 10*3/uL (ref 0.1–1.0)
Monocytes Relative: 12 %
Neutro Abs: 1.5 10*3/uL — ABNORMAL LOW (ref 1.7–7.7)
Neutrophils Relative %: 33 %
Platelets: 223 10*3/uL (ref 150–400)
RBC: 4.88 MIL/uL (ref 4.22–5.81)
RDW: 16 % — ABNORMAL HIGH (ref 11.5–15.5)
WBC: 4.6 10*3/uL (ref 4.0–10.5)
nRBC: 0 % (ref 0.0–0.2)

## 2022-06-23 LAB — BRAIN NATRIURETIC PEPTIDE: B Natriuretic Peptide: 59.1 pg/mL (ref 0.0–100.0)

## 2022-06-23 NOTE — ED Provider Triage Note (Signed)
Emergency Medicine Provider Triage Evaluation Note  Ryan [name] , a 61 y.o. male  was evaluated in triage.  Pt complains of leg swelling.  Reports that he has had swelling to bilateral legs over the last 2 to 3 weeks with right greater than left.  Patient reports that he has a wound to his right lower leg due to his swelling.  Patient does endorse occasional shortness of breath.  Review of Systems  Positive: Leg swelling, shortness of breath, wound Negative: Chest pain, palpitations, color change, pallor, numbness, weakness  Physical Exam  BP (!) 129/96 (BP Location: Right Arm)   Pulse 90   Temp 98.1 F (36.7 C) (Oral)   Resp 18   Ht 5\' 9"  (1.753 m)   Wt 109.8 kg   SpO2 97%   BMI 35.74 kg/m  Gen:   Awake, no distress   Resp:  Normal effort, speaks in full complete sentences without difficulty MSK:   Moves extremities without difficulty  Other:  +2 edema to bilateral lower extremities with small wound to posterior right leg.  Tenderness to right calf.  Medical Decision Making  Medically screening exam initiated at 12:09 PM.  Appropriate orders placed.  was informed that the remainder of the evaluation will be completed by another provider, this initial triage assessment does not replace that evaluation, and the importance of remaining in the ED until their evaluation is complete.  With leg swelling concern for DVT versus possible CHF exacerbation.   Princella Pellegrini, PA-C 06/23/22 1210

## 2022-06-23 NOTE — ED Notes (Signed)
Patient called x3 for room assignment with no response.  

## 2022-06-23 NOTE — ED Provider Notes (Signed)
[facility] EMERGENCY DEPARTMENT Provider Note   CSN: [id number] Arrival date & time: 06/23/22  1142     History  Chief Complaint  Patient presents with   Leg Swelling    Ryan [name] is a 61 y.o. male.  The history is provided by the patient and medical records. No language interpreter was used.    61 year old male significant history of hypertension, diabetes, kidney disease, polysubstance use history of atrial flutter and history of hypercoagulable state currently on Eliquis who presents with complaints of leg swelling.  Patient states he has been having pain and swelling to both his legs right greater than left ongoing for the past month.  Symptoms worse with prolonged standing.  Denies fever denies any recent injury.  He mention he has been seen and evaluated by his doctor and they prescribed a cream for his leg which he used without relief.  He was frustration.  He does not endorse worsening chest pain or shortness of breath.  Home Medications Prior to Admission medications   Medication Sig Start Date End Date Taking? Authorizing Provider  ANTI-FUNGAL 1 % cream Apply 1 application topically daily as needed (anti fungus). 07/23/21   [provider]  apixaban (ELIQUIS) 5 MG TABS tablet Take 1 tablet (5 mg total) by mouth 2 (two) times daily. 02/03/22   Cristina Gong, PA-C  benzonatate (TESSALON) 100 MG capsule Take 1 capsule (100 mg total) by mouth 3 (three) times daily as needed for cough. 11/11/21   Alvira Monday, MD  carvedilol (COREG) 3.125 MG tablet Take 1 tablet (3.125 mg total) by mouth 2 (two) times daily. 01/06/22   Fenton, Clint R, PA  diclofenac Sodium (VOLTAREN) 1 % GEL Apply 2 g topically 4 (four) times daily. 05/06/22   Long, Arlyss Repress, MD  diltiazem (CARDIZEM CD) 240 MG 24 hr capsule Take 1 capsule (240 mg total) by mouth daily. 01/06/22   Fenton, Clint R, PA  furosemide (LASIX) 40 MG tablet Take 1 tablet (40 mg total) by  mouth 2 (two) times daily for 7 days. 10/28/21 01/06/22  Gerhard Munch, MD  glipiZIDE (GLUCOTROL) 10 MG tablet Take 10 mg by mouth daily. 03/04/21   [provider]  insulin NPH-regular Human (NOVOLIN 70/30) (70-30) 100 UNIT/ML injection Inject 35 units into the skin 2 times daily with a meal. Patient taking differently: Inject 40 Units into the skin 2 (two) times daily with a meal. Inject 35 units into the skin 2 times daily with a meal. 07/25/20   Marjie Skiff E, PA-C  losartan (COZAAR) 50 MG tablet Take 1 tablet (50 mg total) by mouth daily. 08/11/20   Corrin Parker, PA-C  omeprazole (PRILOSEC) 40 MG capsule Take 40 mg by mouth daily. 01/07/21   [provider]  ondansetron (ZOFRAN) 4 MG tablet Take 1 tablet (4 mg total) by mouth every 8 (eight) hours as needed for nausea or vomiting. 11/11/21   Alvira Monday, MD  pantoprazole (PROTONIX) 40 MG tablet Take 1 tablet (40 mg total) by mouth 2 (two) times daily. Patient taking differently: Take 40 mg by mouth 2 (two) times daily before a meal. 07/25/20   Marjie Skiff E, PA-C  Potassium Chloride ER 20 MEQ TBCR Take 1 tablet by mouth daily. 10/15/21   [provider]  sucralfate (CARAFATE) 1 g tablet Take 1 tablet (1 g total) by mouth 2 (two) times daily. 07/25/20 02/03/22  Marjie Skiff E, PA-C  triamcinolone cream (KENALOG) 0.1 % Apply  1 application topically 2 (two) times daily as needed (rash). 10/16/21   [provider]  ZYRTEC ALLERGY 10 MG tablet Take 10 mg by mouth in the morning. 03/03/20   [provider]      Allergies    Patient has no known allergies.    Review of Systems   Review of Systems  All other systems reviewed and are negative.   Physical Exam Updated Vital Signs BP (!) 157/89 (BP Location: Right Arm)   Pulse 86   Temp 98 F (36.7 C) (Oral)   Resp 16   Ht 5\' 9"  (1.753 m)   Wt 109.8 kg   SpO2 98%   BMI 35.74 kg/m  Physical Exam Vitals and nursing note  reviewed.  Constitutional:      General: He is not in acute distress.    Appearance: He is well-developed.  HENT:     Head: Atraumatic.  Eyes:     Conjunctiva/sclera: Conjunctivae normal.  Musculoskeletal:        General: Swelling (2+ pitting edema to bilateral lower extremities extending towards the knee) present.     Cervical back: Neck supple.  Skin:    Capillary Refill: Capillary refill takes less than 2 seconds.     Findings: No rash.     Comments: A dime sized shallow ulceration noted to the right mid lower leg on the medial aspect without surrounding erythema or warmth.  Neurological:     Mental Status: He is alert.     ED Results / Procedures / Treatments   Labs (all labs ordered are listed, but only abnormal results are displayed) Labs Reviewed  COMPREHENSIVE METABOLIC PANEL - Abnormal; Notable for the following components:      Result Value   Potassium 3.4 (*)    CO2 21 (*)    Glucose, Bld 220 (*)    Creatinine, Ser 1.43 (*)    Calcium 8.5 (*)    Albumin 3.3 (*)    GFR, Estimated 57 (*)    All other components within normal limits  CBC WITH DIFFERENTIAL/PLATELET - Abnormal; Notable for the following components:   RDW 16.0 (*)    Neutro Abs 1.5 (*)    All other components within normal limits  BRAIN NATRIURETIC PEPTIDE  TROPONIN I (HIGH SENSITIVITY)  TROPONIN I (HIGH SENSITIVITY)    EKG None  Radiology VAS LOWER EXTREMITY VENOUS (DVT) (ONLY [facility] & [facility])  Result Date: 06/23/2022  Lower Venous DVT Study Patient Name:  Ryan [name] [facility]  Date of Exam:   06/23/2022 Medical Rec #: 08/24/2022                 Accession #:    [id number] Date of Birth: 1961-01-29                Patient Gender: M Patient Age:   72 years Exam Location:  [facility] Procedure:      VAS [facility] LOWER EXTREMITY VENOUS (DVT) Referring Phys: [place] --------------------------------------------------------------------------------  Indications: Pain, Swelling, and Edema.   Comparison Study: 03/25/2020 - Negative Performing Technologist: 03/27/2020 RDMS, RVT  Examination Guidelines: A complete evaluation includes B-mode imaging, spectral Doppler, color Doppler, and power Doppler as needed of all accessible portions of each vessel. Bilateral testing is considered an integral part of a complete examination. Limited examinations for reoccurring indications may be performed as noted. The reflux portion of the exam is performed with the patient in reverse Trendelenburg.  +---------+---------------+---------+-----------+----------+--------------+ RIGHT  CompressibilityPhasicitySpontaneityPropertiesThrombus Aging +---------+---------------+---------+-----------+----------+--------------+ CFV      Full           Yes      Yes                                 +---------+---------------+---------+-----------+----------+--------------+ SFJ      Full                                                        +---------+---------------+---------+-----------+----------+--------------+ FV Prox  Full                                                        +---------+---------------+---------+-----------+----------+--------------+ FV Mid   Full                                                        +---------+---------------+---------+-----------+----------+--------------+ FV DistalFull                                                        +---------+---------------+---------+-----------+----------+--------------+ PFV      Full                                                        +---------+---------------+---------+-----------+----------+--------------+ POP      Full           Yes      Yes                                 +---------+---------------+---------+-----------+----------+--------------+ PTV      Full                                                        +---------+---------------+---------+-----------+----------+--------------+  PERO     Full                                                        +---------+---------------+---------+-----------+----------+--------------+ Interstitial fluid noted throughout right calf.  +---------+---------------+---------+-----------+----------+--------------+ LEFT     CompressibilityPhasicitySpontaneityPropertiesThrombus Aging +---------+---------------+---------+-----------+----------+--------------+ CFV      Full           Yes      Yes                                 +---------+---------------+---------+-----------+----------+--------------+  SFJ      Full                                                        +---------+---------------+---------+-----------+----------+--------------+ FV Prox  Full                                                        +---------+---------------+---------+-----------+----------+--------------+ FV Mid   Full                                                        +---------+---------------+---------+-----------+----------+--------------+ FV DistalFull                                                        +---------+---------------+---------+-----------+----------+--------------+ PFV      Full                                                        +---------+---------------+---------+-----------+----------+--------------+ POP      Full           Yes      Yes                                 +---------+---------------+---------+-----------+----------+--------------+ PTV      Full                                                        +---------+---------------+---------+-----------+----------+--------------+ PERO     Full                                                        +---------+---------------+---------+-----------+----------+--------------+     Summary: BILATERAL: - No evidence of deep vein thrombosis seen in the lower extremities, bilaterally. -No evidence of popliteal cyst, bilaterally.    *See table(s) above for measurements and observations.    Preliminary    DG Chest 2 View  Result Date: 06/23/2022 CLINICAL DATA:  shob EXAM: CHEST - 2 VIEW COMPARISON:  May 06, 2022 FINDINGS: The heart size and mediastinal contours are within normal limits. Moderate-sized hiatal hernia. Low lung volumes. Both lungs are clear. The visualized skeletal structures are unremarkable. IMPRESSION: No active cardiopulmonary disease.  Moderate-sized hiatal hernia. Electronically Signed   By: Marjo BickerAmar  Amaresh M.D.   On: 06/23/2022  12:35    Procedures Procedures    Medications Ordered in ED Medications - No data to display  ED Course/ Medical Decision Making/ A&P                           Medical Decision Making  BP (!) 157/89 (BP Location: Right Arm)   Pulse 86   Temp 98 F (36.7 C) (Oral)   Resp 16   Ht 5\' 9"  (1.753 m)   Wt 109.8 kg   SpO2 98%   BMI 35.74 kg/m   8:39 PM This is a 61 year old male with multiple comorbidities which includes diabetes, hypercoagulable state on anticoagulants, CKD, hypertension, polysubstance use who presents with progressive worsening bilateral leg swelling for the past month.  He mention he has been seen and evaluated for his complaint by his PCP and was given some kind of cream and fluid pill but it did not provide adequate relief.  He is frustrated that his leg is still swollen.  He does not endorse any chest pain or shortness of breath no fever or chills and no recent injury.  On exam patient is resting comfortably appears to be in no acute discomfort.  Heart and lung sounds normal, no crackles heard on the lung base.  Exam of his lower extremity reveals 2+ pitting mid to bilateral lower extremities and patient has a shallow ulceration to his right lower extremity without significant signs of infection.  Labs and imaging obtained independently viewed interpreted by me and agree with radiologist interpretation. Labs are reassuring, evidence of kidney disease  with a creatinine of 1.42 however improved from prior.  He is hyperglycemic with a CBG of 220 with normal anion gap and I am not concerned about DKA.  Has normal WBC, normal H&H.  Has normal troponin, normal BNP.  Vascular ultrasound study obtained of bilateral lower extremities without any evidence of DVT.  Chest x-ray obtained without evidence of fluid overload or signs of pneumonia.  I have also reviewed patient's prior EMR and considered my plan of care.  At this time I encourage patient to wear compressive hose to help with leg swelling as well as keeping it elevated at rest.  Otherwise he should follow-up with his PCP for further care.  I do not think he would benefit from antibiotic at this time as there are no obvious signs of infection.  I have considered admission but in the setting of unremarkable work-up, patient able to ambulate, no signs of significant fluid overload requiring emergent intervention, and no signs of infection patient can be discharged home with outpatient management.  This patient presents to the ED for concern of edema, this involves an extensive number of treatment options, and is a complaint that carries with it a high risk of complications and morbidity.  The differential diagnosis includes peripheral edema, CHF, DVT, kidney failure, electrolytes derangement  Co morbidities that complicate the patient evaluation CKD  DM  HTN Additional history obtained:  Additional history obtained from EMS External records from outside source obtained and reviewed including EMR  Lab Tests:  I Ordered, and personally interpreted labs.  The pertinent results include:  as above  Imaging Studies ordered:  I ordered imaging studies including DVT study I independently visualized and interpreted imaging which showed no DVT I agree with the radiologist interpretation  Cardiac Monitoring:  The patient was maintained on a cardiac monitor.  I personally viewed and interpreted the  cardiac monitored  which showed an underlying rhythm of: NSR  Medicines ordered and prescription drug management:   Test Considered: as above  Critical Interventions: as above   Problem List / ED Course: peripheral edema  Reevaluation:  After the interventions noted above, I reevaluated the patient and found that they have :stayed the same  Social Determinants of Health: tobacco use  Dispostion:  After consideration of the diagnostic results and the patients response to treatment, I feel that the patent would benefit from outpt f/u.         Final Clinical Impression(s) / ED Diagnoses Final diagnoses:  Bilateral lower extremity edema    Rx / DC Orders ED Discharge Orders     None         Fayrene Helper, PA-C 06/23/22 2047    Ernie Avena, MD 06/23/22 2350

## 2022-06-23 NOTE — Discharge Instructions (Signed)
You have been evaluated for your leg swelling.  Fortunately no evidence of blood clots in your legs, no evidence of infection, and no signs of congestive heart failure.  Please wear compressive socks on a regular basis to help with swelling.  When resting, keep your legs elevated above your heart as it will help with your swelling.  Follow-up closely with your primary care doctor for further care.

## 2022-06-23 NOTE — Progress Notes (Signed)
Bilateral lower ext venous duplex  has been completed. Refer to [facility] under chart review to view preliminary results.   06/23/2022  2:56 PM Eevie Lapp, Gerarda Gunther

## 2022-06-23 NOTE — ED Triage Notes (Signed)
Patient has sore to right leg and it is weeping.  Patient reports it is painful and fluid is leaking.  Was seen and given fluid pills and cream but patient reports it is not working. Hx DM

## 2022-06-23 NOTE — ED Notes (Signed)
Patient verbalizes understanding of d/c instructions. Opportunities for questions and answers were provided. Pt d/c from ED and ambulated to lobby.  

## 2022-07-26 ENCOUNTER — Other Ambulatory Visit: Payer: Self-pay

## 2022-07-26 ENCOUNTER — Emergency Department (HOSPITAL_COMMUNITY)
Admission: EM | Admit: 2022-07-26 | Discharge: 2022-07-27 | Disposition: A | Discharge status: Home / Self Care | Payer: Self-pay | Attending: Emergency Medicine | Admitting: Emergency Medicine

## 2022-07-26 ENCOUNTER — Encounter (HOSPITAL_COMMUNITY): Payer: Self-pay

## 2022-07-26 ENCOUNTER — Emergency Department (HOSPITAL_COMMUNITY): Payer: Self-pay

## 2022-07-26 DIAGNOSIS — Z7901 Long term (current) use of anticoagulants: Secondary | ICD-10-CM | POA: Insufficient documentation

## 2022-07-26 DIAGNOSIS — E1122 Type 2 diabetes mellitus with diabetic chronic kidney disease: Secondary | ICD-10-CM | POA: Insufficient documentation

## 2022-07-26 DIAGNOSIS — Z794 Long term (current) use of insulin: Secondary | ICD-10-CM | POA: Insufficient documentation

## 2022-07-26 DIAGNOSIS — I129 Hypertensive chronic kidney disease with stage 1 through stage 4 chronic kidney disease, or unspecified chronic kidney disease: Secondary | ICD-10-CM | POA: Insufficient documentation

## 2022-07-26 DIAGNOSIS — E876 Hypokalemia: Secondary | ICD-10-CM | POA: Insufficient documentation

## 2022-07-26 DIAGNOSIS — N1832 Chronic kidney disease, stage 3b: Secondary | ICD-10-CM | POA: Insufficient documentation

## 2022-07-26 DIAGNOSIS — Z79899 Other long term (current) drug therapy: Secondary | ICD-10-CM | POA: Insufficient documentation

## 2022-07-26 DIAGNOSIS — M545 Low back pain, unspecified: Secondary | ICD-10-CM | POA: Insufficient documentation

## 2022-07-26 LAB — URINALYSIS, ROUTINE W REFLEX MICROSCOPIC
Bacteria, UA: NONE SEEN
Bilirubin Urine: NEGATIVE
Glucose, UA: NEGATIVE mg/dL
Hgb urine dipstick: NEGATIVE
Ketones, ur: NEGATIVE mg/dL
Leukocytes,Ua: NEGATIVE
Nitrite: NEGATIVE
Protein, ur: 30 mg/dL — AB
Specific Gravity, Urine: 1.021 (ref 1.005–1.030)
pH: 5 (ref 5.0–8.0)

## 2022-07-26 LAB — CBC WITH DIFFERENTIAL/PLATELET
Abs Immature Granulocytes: 0.02 10*3/uL (ref 0.00–0.07)
Basophils Absolute: 0 10*3/uL (ref 0.0–0.1)
Basophils Relative: 1 %
Eosinophils Absolute: 0.1 10*3/uL (ref 0.0–0.5)
Eosinophils Relative: 2 %
HCT: 39.4 % (ref 39.0–52.0)
Hemoglobin: 13.5 g/dL (ref 13.0–17.0)
Immature Granulocytes: 0 %
Lymphocytes Relative: 39 %
Lymphs Abs: 2.3 10*3/uL (ref 0.7–4.0)
MCH: 28.4 pg (ref 26.0–34.0)
MCHC: 34.3 g/dL (ref 30.0–36.0)
MCV: 82.9 fL (ref 80.0–100.0)
Monocytes Absolute: 0.6 10*3/uL (ref 0.1–1.0)
Monocytes Relative: 11 %
Neutro Abs: 2.8 10*3/uL (ref 1.7–7.7)
Neutrophils Relative %: 47 %
Platelets: 214 10*3/uL (ref 150–400)
RBC: 4.75 MIL/uL (ref 4.22–5.81)
RDW: 14.9 % (ref 11.5–15.5)
WBC: 5.8 10*3/uL (ref 4.0–10.5)
nRBC: 0 % (ref 0.0–0.2)

## 2022-07-26 LAB — BASIC METABOLIC PANEL
Anion gap: 9 (ref 5–15)
BUN: 17 mg/dL (ref 6–20)
CO2: 22 mmol/L (ref 22–32)
Calcium: 8.7 mg/dL — ABNORMAL LOW (ref 8.9–10.3)
Chloride: 105 mmol/L (ref 98–111)
Creatinine, Ser: 1.9 mg/dL — ABNORMAL HIGH (ref 0.61–1.24)
GFR, Estimated: 40 mL/min — ABNORMAL LOW (ref >60)
Glucose, Bld: 160 mg/dL — ABNORMAL HIGH (ref 70–99)
Potassium: 3 mmol/L — ABNORMAL LOW (ref 3.5–5.1)
Sodium: 136 mmol/L (ref 135–145)

## 2022-07-26 NOTE — ED Triage Notes (Signed)
Pt reports sudden onset of right lower back pain while at work yesterday, no other symptoms.

## 2022-07-26 NOTE — ED Provider Triage Note (Signed)
Emergency Medicine Provider Triage Evaluation Note  Ryan [name] , a 61 y.o. male  was evaluated in triage.  Pt complains of right lumbar back pain.  Pain started yesterday while he was at work at [employer].  Denies any heavy lifting or traumatic injuries.  Pain does not radiate.  No associated fevers, chills, dysuria, hematuria, urinary urgency, urinary frequency, numbness, weakness, saddle anesthesia, bowel/bladder dysfunction.  Review of Systems  Positive: See above Negative: see above  Physical Exam  BP 132/80 (BP Location: Right Arm)   Pulse 71   Temp 98.8 F (37.1 C) (Oral)   Resp 18   Ht 5\' 9"  (1.753 m)   Wt 110.2 kg   SpO2 92%   BMI 35.88 kg/m  Gen:   Awake, no distress   Resp:  Normal effort  MSK:   Moves extremities without difficulty  Other:  No midline tenderness or deformity to cervical, thoracic, or lumbar spine.  Tenderness to right lumbar back.  Medical Decision Making  Medically screening exam initiated at 3:20 PM.  Appropriate orders placed.  was informed that the remainder of the evaluation will be completed by another provider, this initial triage assessment does not replace that evaluation, and the importance of remaining in the ED until their evaluation is complete.     Princella Pellegrini, Haskel Schroeder 07/26/22 1521

## 2022-07-27 MED ORDER — METHOCARBAMOL 500 MG PO TABS: 500.0000 mg | ORAL_TABLET | Freq: Two times a day (BID) | ORAL | 0 refills | Status: DC

## 2022-07-27 MED ORDER — POTASSIUM CHLORIDE CRYS ER 20 MEQ PO TBCR
40.0000 meq | EXTENDED_RELEASE_TABLET | Freq: Once | ORAL | Status: AC
Start: 2022-07-27 — End: 2022-07-27
  Administered 2022-07-27: 40 meq via ORAL
  Filled 2022-07-27: qty 2

## 2022-07-27 MED ORDER — IBUPROFEN 400 MG PO TABS
600.0000 mg | ORAL_TABLET | Freq: Once | ORAL | Status: AC
  Administered 2022-07-27: 600 mg via ORAL
  Filled 2022-07-27: qty 1

## 2022-07-27 MED ORDER — METHOCARBAMOL 500 MG PO TABS
500.0000 mg | ORAL_TABLET | Freq: Once | ORAL | Status: AC
  Administered 2022-07-27: 500 mg via ORAL
  Filled 2022-07-27: qty 1

## 2022-07-27 MED ORDER — HYDROCODONE-ACETAMINOPHEN 5-325 MG PO TABS
1.0000 | ORAL_TABLET | ORAL | 0 refills | Status: DC | PRN
Start: 2022-07-27 — End: 2023-04-11

## 2022-07-27 NOTE — ED Provider Notes (Signed)
[facility] EMERGENCY DEPARTMENT Provider Note   CSN: [id number] Arrival date & time: 07/26/22  1445     History  Chief Complaint  Patient presents with   Back Pain    Ryan [name] is a 61 y.o. male.  Pt is a 61 yo male with a pmhx significant for HTN, CKD, GERD, DM, and aflutter on Eliquis.  He comes from work with right sided back pain.  Pt said he hurt his back at work.  He denies any pain in his abdomen.  No pain in his legs.  No urinary sx.  Pt said it hurts to move.  He is able to walk and denies any bowel or bladder dysfunction.       Home Medications Prior to Admission medications   Medication Sig Start Date End Date Taking? Authorizing Provider  HYDROcodone-acetaminophen (NORCO/VICODIN) 5-325 MG tablet Take 1 tablet by mouth every 4 (four) hours as needed. 07/27/22  Yes Jacalyn Lefevre, MD  methocarbamol (ROBAXIN) 500 MG tablet Take 1 tablet (500 mg total) by mouth 2 (two) times daily. 07/27/22  Yes Jacalyn Lefevre, MD  ANTI-FUNGAL 1 % cream Apply 1 application topically daily as needed (anti fungus). 07/23/21   [provider]  apixaban (ELIQUIS) 5 MG TABS tablet Take 1 tablet (5 mg total) by mouth 2 (two) times daily. 02/03/22   Cristina Gong, PA-C  benzonatate (TESSALON) 100 MG capsule Take 1 capsule (100 mg total) by mouth 3 (three) times daily as needed for cough. 11/11/21   Alvira Monday, MD  carvedilol (COREG) 3.125 MG tablet Take 1 tablet (3.125 mg total) by mouth 2 (two) times daily. 01/06/22   Fenton, Clint R, PA  diclofenac Sodium (VOLTAREN) 1 % GEL Apply 2 g topically 4 (four) times daily. 05/06/22   Long, Arlyss Repress, MD  diltiazem (CARDIZEM CD) 240 MG 24 hr capsule Take 1 capsule (240 mg total) by mouth daily. 01/06/22   Fenton, Clint R, PA  furosemide (LASIX) 40 MG tablet Take 1 tablet (40 mg total) by mouth 2 (two) times daily for 7 days. 10/28/21 01/06/22  Gerhard Munch, MD  glipiZIDE (GLUCOTROL) 10 MG tablet Take  10 mg by mouth daily. 03/04/21   [provider]  insulin NPH-regular Human (NOVOLIN 70/30) (70-30) 100 UNIT/ML injection Inject 35 units into the skin 2 times daily with a meal. Patient taking differently: Inject 40 Units into the skin 2 (two) times daily with a meal. Inject 35 units into the skin 2 times daily with a meal. 07/25/20   Marjie Skiff E, PA-C  losartan (COZAAR) 50 MG tablet Take 1 tablet (50 mg total) by mouth daily. 08/11/20   Corrin Parker, PA-C  omeprazole (PRILOSEC) 40 MG capsule Take 40 mg by mouth daily. 01/07/21   [provider]  ondansetron (ZOFRAN) 4 MG tablet Take 1 tablet (4 mg total) by mouth every 8 (eight) hours as needed for nausea or vomiting. 11/11/21   Alvira Monday, MD  pantoprazole (PROTONIX) 40 MG tablet Take 1 tablet (40 mg total) by mouth 2 (two) times daily. Patient taking differently: Take 40 mg by mouth 2 (two) times daily before a meal. 07/25/20   Marjie Skiff E, PA-C  Potassium Chloride ER 20 MEQ TBCR Take 1 tablet by mouth daily. 10/15/21   [provider]  sucralfate (CARAFATE) 1 g tablet Take 1 tablet (1 g total) by mouth 2 (two) times daily. 07/25/20 02/03/22  Marjie Skiff E, PA-C  triamcinolone cream (KENALOG)  0.1 % Apply 1 application topically 2 (two) times daily as needed (rash). 10/16/21   [provider]  ZYRTEC ALLERGY 10 MG tablet Take 10 mg by mouth in the morning. 03/03/20   [provider]      Allergies    Patient has no known allergies.    Review of Systems   Review of Systems  Musculoskeletal:  Positive for back pain.  All other systems reviewed and are negative.   Physical Exam Updated Vital Signs BP 136/88   Pulse (!) 127   Temp (!) 97.5 F (36.4 C) (Oral)   Resp 14   Ht 5\' 9"  (1.753 m)   Wt 110.2 kg   SpO2 95%   BMI 35.88 kg/m  Physical Exam Vitals and nursing note reviewed.  Constitutional:      Appearance: Normal appearance.  HENT:     Head: Normocephalic  and atraumatic.     Right Ear: External ear normal.     Left Ear: External ear normal.     Nose: Nose normal.     Mouth/Throat:     Mouth: Mucous membranes are moist.     Pharynx: Oropharynx is clear.  Eyes:     Conjunctiva/sclera: Conjunctivae normal.     Pupils: Pupils are equal, round, and reactive to light.     Comments: Strabismus   Cardiovascular:     Rate and Rhythm: Normal rate and regular rhythm.     Pulses: Normal pulses.     Heart sounds: Normal heart sounds.  Pulmonary:     Effort: Pulmonary effort is normal.     Breath sounds: Normal breath sounds.  Abdominal:     General: Abdomen is flat. Bowel sounds are normal.     Palpations: Abdomen is soft.  Musculoskeletal:        General: Normal range of motion.       Arms:     Cervical back: Normal range of motion and neck supple.  Skin:    General: Skin is warm.     Capillary Refill: Capillary refill takes less than 2 seconds.  Neurological:     General: No focal deficit present.     Mental Status: He is alert and oriented to person, place, and time.  Psychiatric:        Mood and Affect: Mood normal.        Behavior: Behavior normal.     ED Results / Procedures / Treatments   Labs (all labs ordered are listed, but only abnormal results are displayed) Labs Reviewed  URINALYSIS, ROUTINE W REFLEX MICROSCOPIC - Abnormal; Notable for the following components:      Result Value   Protein, ur 30 (*)    All other components within normal limits  BASIC METABOLIC PANEL - Abnormal; Notable for the following components:   Potassium 3.0 (*)    Glucose, Bld 160 (*)    Creatinine, Ser 1.90 (*)    Calcium 8.7 (*)    GFR, Estimated 40 (*)    All other components within normal limits  URINE CULTURE  CBC WITH DIFFERENTIAL/PLATELET    EKG None  Radiology DG Lumbar Spine Complete  Result Date: 07/26/2022 CLINICAL DATA:  Right-sided lumbar back pain EXAM: LUMBAR SPINE - COMPLETE 4+ VIEW COMPARISON:  X-ray lumbar spine  08/07/2020, CT abdomen pelvis 07/26/2021 FINDINGS: Limited evaluation due to overlapping osseous structures and overlying soft tissues. There is no evidence of lumbar spine fracture. Mild osteophyte formation. Mild moderate facet arthropathy. Stable grade  1 anterolisthesis of L4 on L5. Intervertebral disc spaces are maintained. IMPRESSION: No acute displaced fracture or traumatic listhesis of the lumbar spine. Limited evaluation due to overlapping osseous structures and overlying soft tissues. Electronically Signed   By: Tish Frederickson M.D.   On: 07/26/2022 15:53    Procedures Procedures    Medications Ordered in ED Medications  ibuprofen (ADVIL) tablet 600 mg (has no administration in time range)  methocarbamol (ROBAXIN) tablet 500 mg (has no administration in time range)  potassium chloride SA (KLOR-CON M) CR tablet 40 mEq (has no administration in time range)    ED Course/ Medical Decision Making/ A&P                           Medical Decision Making Risk Prescription drug management.   This patient presents to the ED for concern of lbp, this involves an extensive number of treatment options, and is a complaint that carries with it a high risk of complications and morbidity.  The differential diagnosis includes msk, infection, kidney stone, uti   Co morbidities that complicate the patient evaluation   HTN, CKD, GERD, DM, and aflutter on Eliquis   Additional history obtained:  Additional history obtained from epic chart review  Lab Tests:  I Ordered, and personally interpreted labs.  The pertinent results include:  cbc nl, bmp with K 3.0, Glc 160, Cr 1.9 (chronic); UA + protein   Imaging Studies ordered:  I ordered imaging studies including lumbar xrays  I independently visualized and interpreted imaging which showed  IMPRESSION:  No acute displaced fracture or traumatic listhesis of the lumbar  spine. Limited evaluation due to overlapping osseous structures and   overlying soft tissues.   I agree with the radiologist interpretation     Medicines ordered and prescription drug management:  I ordered medication including ibuprofen and robaxin  for pain  Reevaluation of the patient after these medicines showed that the patient improved I have reviewed the patients home medicines and have made adjustments as needed   Problem List / ED Course:  Low back pain:  pain is msk in nature.  No abd pain and no urinary sx.  I will start him on some meds for his sx and give him exercises to perform.  Pt is stable for d/c.  He needs to f/u with pcp and to return if worse.   Social Determinants of Health:  No insurance   Dispostion:  After consideration of the diagnostic results and the patients response to treatment, I feel that the patent would benefit from discharge with outpatient f/u.          Final Clinical Impression(s) / ED Diagnoses Final diagnoses:  Acute right-sided low back pain without sciatica  Hypokalemia  Stage 3b chronic kidney disease (HCC)    Rx / DC Orders ED Discharge Orders          Ordered    methocarbamol (ROBAXIN) 500 MG tablet  2 times daily        07/27/22 0850    HYDROcodone-acetaminophen (NORCO/VICODIN) 5-325 MG tablet  Every 4 hours PRN        07/27/22 0850              Jacalyn Lefevre, MD 07/27/22 [phone]

## 2022-07-28 LAB — URINE CULTURE: Culture: NO GROWTH

## 2022-09-13 ENCOUNTER — Encounter (HOSPITAL_BASED_OUTPATIENT_CLINIC_OR_DEPARTMENT_OTHER): Payer: Self-pay | Attending: Internal Medicine | Admitting: General Surgery

## 2022-09-13 DIAGNOSIS — L97812 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ed: Secondary | ICD-10-CM | POA: Insufficient documentation

## 2022-09-13 DIAGNOSIS — N183 Chronic kidney disease, stage 3 unspecified: Secondary | ICD-10-CM | POA: Insufficient documentation

## 2022-09-13 DIAGNOSIS — I4892 Unspecified atrial flutter: Secondary | ICD-10-CM | POA: Insufficient documentation

## 2022-09-13 DIAGNOSIS — I129 Hypertensive chronic kidney disease with stage 1 through stage 4 chronic kidney disease, or unspecified chronic kidney disease: Secondary | ICD-10-CM | POA: Insufficient documentation

## 2022-09-13 DIAGNOSIS — E1122 Type 2 diabetes mellitus with diabetic chronic kidney disease: Secondary | ICD-10-CM | POA: Insufficient documentation

## 2022-09-13 DIAGNOSIS — E11622 Type 2 diabetes mellitus with other skin ulcer: Secondary | ICD-10-CM | POA: Insufficient documentation

## 2022-09-13 NOTE — Progress Notes (Signed)
Ryan [name], Ryan [name] ([id number]) Visit Report for [name] Abuse Risk Screen Details Patient Name: Date of Service: Ryan [name], Ryan [name] Ryan [name] 10:30 A M Medical Record Number: [id number] Patient Account Number: [id number] Date of Birth/Sex: Treating RN: 1961/08/07 (61 y.o. Ryan [name] Primary Care Ryan [name]: Ryan [name] Other Clinician: Referring Ryan [name]: Treating Ryan [name]/Extender: Ryan [name] in Treatment: 0 Abuse Risk Screen Items Answer ABUSE RISK SCREEN: Has anyone close to you tried to hurt or harm you recentlyo No Do you feel uncomfortable with anyone in your familyo No Has anyone forced you do things that you didnt want to doo No Electronic Signature(s) Signed: 09/13/2022 4:21:20 PM By: Ryan Ard RN Entered By: Ryan [name] on 09/13/2022 10:24:44 -------------------------------------------------------------------------------- Activities of Daily Living Details Patient Name: Date of Service: Ryan [name], Ryan [name] [place] R. [name] 10:30 A M Medical Record Number: [id number] Patient Account Number: [id number] Date of Birth/Sex: Treating RN: [birth year] (61 y.o. Ryan [name] Primary Care Ryan [name]: Ryan [name] Other Clinician: Referring Ryan [name]: Treating Ryan [name]/Extender: Ryan [name] in Treatment: 0 Activities of Daily Living Items Answer Activities of Daily Living (Please select one for each item) Drive Automobile Completely Able T Medications ake Completely Able Use T elephone Completely Able Care for Appearance Completely Able Use T oilet Completely Able Bath / Shower Completely Able Dress Self Completely Able Feed Self Completely Able Walk Completely Able Get In / Out Bed Completely Able Housework Completely Able Prepare Meals Completely Able Handle Money Completely Able Shop for Self Completely Able Electronic Signature(s) Signed: 09/13/2022 4:21:20 PM By: Ryan Ard RN Entered By: Ryan [name] on 09/13/2022 10:25:12 -------------------------------------------------------------------------------- Education Screening Details Patient Name: Date of Service: Ryan [place] R. [name] 10:30 A M Medical Record Number: [id number] Patient Account Number: [id number] Date of Birth/Sex: Treating RN: [birth year] (61 y.o. Ryan [name] Primary Care Ryan [name]: Ryan [name] Other Clinician: Referring Ryan [name]: Treating Ryan [name]/Extender: Ryan [name] in Treatment: 0 Primary Learner Assessed: Patient Learning Preferences/Education Level/Primary Language Learning Preference: Explanation Highest Education Level: High School Preferred Language: English Cognitive Barrier Language Barrier: No Translator Needed: No Memory Deficit: No Emotional Barrier: No Physical Barrier Impaired Vision: Yes Ryan [name] Impaired Hearing: No Decreased Hand dexterity: No Knowledge/Comprehension Knowledge Level: High Comprehension Level: High Ability to understand written instructions: High Ability to understand verbal instructions: High Motivation Anxiety Level: Calm Cooperation: Cooperative Education Importance: Acknowledges Need Interest in Health Problems: Asks Questions Perception: Coherent Willingness to Engage in Self-Management High Activities: Readiness to Engage in Self-Management High Activities: Electronic Signature(s) Signed: 09/13/2022 4:21:20 PM By: Ryan Ard RN Entered By: Ryan [name] on 09/13/2022 10:25:41 -------------------------------------------------------------------------------- Fall Risk Assessment Details Patient Name: Date of Service: Ryan [place] R. [name] 10:30 A M Medical Record Number: [id number] Patient Account Number: [id number] Date of Birth/Sex: Treating RN: 02-01-1961 (61 y.o. Ryan [name] Primary Care Ryan [name]: Ryan [name] Other Clinician: Referring Ryan [name]: Treating Ryan [name]/Extender: Ryan [name] in Treatment: 0 Fall Risk Assessment Items Have you had 2 or more falls in the last 12 monthso 0 No Have you had any fall that resulted in injury in the last 12 monthso 0 No FALLS RISK SCREEN History of falling - immediate or within 3 months 0 No Secondary diagnosis (Do you have 2 or more medical diagnoseso) 0 No Ambulatory aid None/bed rest/wheelchair/nurse 0 Yes Crutches/cane/walker 0 No Furniture 0 No Intravenous therapy Access/Saline/Heparin Lock 0 No Gait/Transferring Normal/ bed rest/ wheelchair 0 Yes Weak (short steps with or without shuffle,  stooped but able to lift head while walking, may seek 0 No support from furniture) Impaired (short steps with shuffle, may have difficulty arising from chair, head down, impaired 0 No balance) Mental Status Oriented to own ability 0 Yes Electronic Signature(s) Signed: 09/13/2022 4:21:20 PM By: Ryan East RN Entered By: Ryan [name] on 09/13/2022 10:25:58 -------------------------------------------------------------------------------- Foot Assessment Details Patient Name: Date of Service: Ryan [place] R. [name] 10:30 A M Medical Record Number: [id number] Patient Account Number: [id number] Date of Birth/Sex: Treating RN: [birth year] (61 y.o. Ryan [name] Primary Care Ryan [name]: Ryan [name] Other Clinician: Referring Ryan [name]: Treating Ryan [name]/Extender: Ryan [name] in Treatment: 0 Foot Assessment Items Site Locations + = Sensation present, - = Sensation absent, C = Callus, U = Ulcer R = Redness, W = Warmth, M = Maceration, PU = Pre-ulcerative lesion F = Fissure, S = Swelling, D = Dryness Assessment Right: Left: Other Deformity: No No Prior Foot Ulcer: No No Prior Amputation: No No Charcot Joint: No No Ambulatory Status: Ambulatory Without Help Gait: Steady Electronic Signature(s) Signed: 09/13/2022 4:21:20 PM By: Ryan East RN Entered By: Ryan [name] on 09/13/2022 10:28:55 -------------------------------------------------------------------------------- Nutrition Risk Screening Details Patient Name: Date of Service: Ryan [name], Ryan [place] R. [name] 10:30 A M Medical Record Number: [id number] Patient Account Number: [id number] Date of Birth/Sex: Treating RN: 18-Dec-1960 (61 y.o. Ryan [name] Primary Care Yassine Brunsman: Ryan [name] Other Clinician: Referring Adelyna Brockman: Treating Siana Panameno/Extender: Ryan [name] in Treatment: 0 Height (in): 69 Weight (lbs): 243 Body Mass Index (BMI): 35.9 Nutrition Risk Screening Items Score Screening NUTRITION RISK SCREEN: I have an illness or condition that made me change the kind and/or amount of food I eat 0 No I eat fewer than two meals per day 0 No I eat few fruits and vegetables, or milk products 0 No I have three or more drinks of beer, liquor or wine almost every day 2 Yes I have tooth or mouth problems that make it hard for me to eat 0 No I don't always have enough money to buy the food I need 0 No I eat alone most of the time 0 No I take three or more different prescribed or over-the-counter drugs a day 1 Yes Without wanting to, I have lost or gained 10 pounds in the last six months 0 No I am not always physically able to shop, cook and/or feed myself 0 No Nutrition Protocols Good Risk Protocol Provide education on elevated blood Moderate Risk Protocol 0 sugars and impact on wound healing, as applicable High Risk Proctocol Risk Level: Moderate Risk Score: 3 Electronic Signature(s) Signed: 09/13/2022 4:21:20 PM By: Ryan East RN Entered By: Ryan [name] on 09/13/2022 10:26:29

## 2022-09-13 NOTE — Progress Notes (Addendum)
Hortencia ConradiSTEEPLES, Mariano R. ([id number]) Visit Report for 09/13/2022 Chief Complaint Document Details Patient Name: Date of Service: Ryan BenchSTEEPLES, A [facility] [place] [facility] 09/13/2022 10:30 A M Medical Record Number: [id number] Patient Account Number: [id number] Date of Birth/Sex: Treating RN: [birth year] (61 y.o. M) Primary Care Provider: [name] HivesPlacey, Mary Other Clinician: Referring Provider: Treating Provider/Extender: Mariane Baumgartenannon, Kemani Demarais Placey, Mary Weeks in Treatment: 0 Information Obtained from: Patient Chief Complaint Patients presents for treatment of an open diabetic ulcer on his medial right lower leg Electronic Signature(s) Signed: 09/13/2022 11:23:12 AM By: Duanne Guessannon, Zayah Keilman MD FACS Entered By: Duanne Guessannon, Daequan Kozma on 09/13/2022 11:23:11 -------------------------------------------------------------------------------- Debridement Details Patient Name: Date of Service: Ryan DelaineSTEEPLES, A [facility] [place] R. 09/13/2022 10:30 A M Medical Record Number: [id number] Patient Account Number: [id number] Date of Birth/Sex: Treating RN: [birth year] (61 y.o. Valma CavaM) Zochol, Jamie Primary Care Provider: [name] HivesPlacey, Mary Other Clinician: Referring Provider: Treating Provider/Extender: Mariane Baumgartenannon, Tashonda Pinkus Placey, Mary Weeks in Treatment: 0 Debridement Performed for Assessment: Wound #1 Right,Medial Lower Leg Performed By: Physician Duanne Guessannon, Toluwanimi Radebaugh, MD Debridement Type: Debridement Severity of Tissue Pre Debridement: Fat layer exposed Level of Consciousness (Pre-procedure): Awake and Alert Pre-procedure Verification/Time Out Yes - 10:48 Taken: Start Time: 10:49 Pain Control: Lidocaine 5% topical ointment T Area Debrided (L x W): otal 0.8 (cm) x 0.5 (cm) = 0.4 (cm) Tissue and other material debrided: Non-Viable, Eschar, Slough, Slough Level: Non-Viable Tissue Debridement Description: Selective/Open Wound Instrument: Curette Specimen: Tissue Culture Number of Specimens T aken: 1 Bleeding: Minimum Hemostasis Achieved: Pressure Procedural Pain:  10 Post Procedural Pain: 0 Response to Treatment: Procedure was tolerated well Level of Consciousness (Post- Awake and Alert procedure): Post Debridement Measurements of Total Wound Length: (cm) 0.8 Width: (cm) 0.6 Depth: (cm) 0.2 Volume: (cm) 0.075 Character of Wound/Ulcer Post Debridement: Requires Further Debridement Severity of Tissue Post Debridement: Fat layer exposed Post Procedure Diagnosis Same as Pre-procedure Notes Scribed for Dr. Lady Garyannon by Tommie ArdJamie Zochol, RN Electronic Signature(s) Signed: 09/13/2022 12:03:04 PM By: Duanne Guessannon, Keyler Hoge MD FACS Signed: 09/13/2022 4:21:20 PM By: Tommie ArdZochol, Jamie RN Entered By: Tommie ArdZochol, Jamie on 09/13/2022 11:24:11 -------------------------------------------------------------------------------- HPI Details Patient Name: Date of Service: Ryan DelaineSTEEPLES, A [facility] [place] R. 09/13/2022 10:30 A M Medical Record Number: [id number] Patient Account Number: [id number] Date of Birth/Sex: Treating RN: [birth year] (61 y.o. M) Primary Care Provider: [name] HivesPlacey, Mary Other Clinician: Referring Provider: Treating Provider/Extender: Mariane Baumgartenannon, Henry Demeritt Placey, Mary Weeks in Treatment: 0 History of Present Illness HPI Description: ADMISSION 10/01/2022 This is a 61 year old poorly controlled type II diabetic (last hemoglobin A1c 10%) with hypertension and history of substance abuse who presents to clinic today with a wound on his right medial lower leg. He says that he is not entirely sure how it started, but it has been present for about a month. He says that the wound and the area surrounding it are exquisitely tender. His primary care provider prescribed a steroid cream which she has been using, but he says it has not helped. ABI in clinic today was noncompressible, but he has easily palpable pulses. On the medial aspect of his right lower leg, there is a small wound exposing the fat layer. There is slough and eschar on the surface. There is no odor or purulent  drainage. Electronic Signature(s) Signed: 09/13/2022 11:29:35 AM By: Duanne Guessannon, Alta Shober MD FACS Entered By: Duanne Guessannon, Gina Costilla on 09/13/2022 11:29:35 -------------------------------------------------------------------------------- Physical Exam Details Patient Name: Date of Service: Ryan DelaineSTEEPLES, A [facility] [place] R. 09/13/2022 10:30 A M Medical Record Number: [id number] Patient Account Number: [id number] Date of Birth/Sex: Treating RN: [birth year] (61 y.o. M) Primary Care Provider:  Jack Quarto Other Clinician: Referring Provider: Treating Provider/Extender: Ernestina Columbia in Treatment: 0 Constitutional Slightly hypertensive. . . . No acute distress. Respiratory Normal work of breathing on room air.. Cardiovascular . 2+ pitting edema to above the knees bilaterally. Notes 09/13/2022: On the medial aspect of his right lower leg, there is a small wound exposing the fat layer. There is slough and eschar on the surface. There is no odor or purulent drainage. Electronic Signature(s) Signed: 09/13/2022 11:31:44 AM By: Fredirick Maudlin MD FACS Previous Signature: 09/13/2022 11:30:10 AM Version By: Fredirick Maudlin MD FACS Entered By: Fredirick Maudlin on 09/13/2022 11:31:43 -------------------------------------------------------------------------------- Physician Orders Details Patient Name: Date of Service: Marcelino Scot [place] R. 09/13/2022 10:30 A M Medical Record Number: [id number] Patient Account Number: [id number] Date of Birth/Sex: Treating RN: 09-Sep-1961 (61 y.o. Waldron Session Primary Care Provider: Jack Quarto Other Clinician: Referring Provider: Treating Provider/Extender: Ernestina Columbia in Treatment: 0 Verbal / Phone Orders: No Diagnosis Coding ICD-10 Coding Code Description [phone] 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ed I10 Essential (primary) hypertension I48.92 Unspecified atrial flutter N18.30  Chronic kidney disease, stage 3 unspecified E11.622 Type 2 diabetes mellitus with other skin ulcer Follow-up Appointments ppointment in 1 week. - Dr. Celine Ahr Rm 4 Return A Wednesday 09/22/2022 at 9:45AM Anesthetic (In clinic) Topical Lidocaine 5% applied to wound bed - in clinic, prior to debridement Bathing/ Shower/ Hygiene May shower with protection but do not get wound dressing(s) wet. - use a cast protector that can be purchased at [employer] or [facility] Edema Control - Lymphedema / SCD / Other Elevate legs to the level of the heart or above for 30 minutes daily and/or when sitting, a frequency of: void standing for long periods of time. - T ake15 minute break every four hours while standing for long periods of time. A Exercise regularly Moisturize legs daily. Wound Treatment Wound #1 - Lower Leg Wound Laterality: Right, Medial Cleanser: Soap and Water Discharge Instructions: May shower and wash wound with dial antibacterial soap and water prior to dressing change. Peri-Wound Care: Sween Lotion (Moisturizing lotion) Discharge Instructions: Apply moisturizing lotion as directed Topical: Mupirocin Ointment Discharge Instructions: Apply Mupirocin (Bactroban) as instructed Prim Dressing: KerraCel Ag Gelling Fiber Dressing, 2x2 in (silver alginate) ary Discharge Instructions: Apply silver alginate to wound bed as instructed Secondary Dressing: Zetuvit Plus Silicone Border Dressing 5x5 (in/in) Discharge Instructions: Apply silicone border over primary dressing as directed. Secured With: 30M Medipore Public affairs consultant Surgical T 2x10 (in/yd) ape Discharge Instructions: Secure with tape as directed. Compression Wrap: ThreePress (3 layer compression wrap) Discharge Instructions: Apply three layer compression as directed. Laboratory erobe culture (MICRO) - Non-healing wound to Right medial lower leg - (ICD10 L97.812 - Non- Bacteria identified in Unspecified specimen by A pressure chronic ulcer of  other part of right lower leg with fat layer exposed) LOINC Code: 371-6 Convenience Name: Aerobic culture-specimen not specified Electronic Signature(s) Signed: 09/15/2022 4:56:11 PM By: Fredirick Maudlin MD FACS Signed: 09/15/2022 5:03:36 PM By: Blanche East RN Previous Signature: 09/13/2022 12:03:04 PM Version By: Fredirick Maudlin MD FACS Entered By: Blanche East on 09/15/2022 16:31:28 Prescription 09/13/2022 -------------------------------------------------------------------------------- Guillermina City MD Patient Name: Provider: 1961-10-19 [id number] Date of Birth: NPI#Jerilynn Mages [id number] Sex: DEA #: [id number] [phone] Phone #: License #: [place] Patient Address: [address] [place], [place] [address] [place], [place]  [id number] [phone] Allergies No Known Allergies Provider's Orders erobe culture - ICD10: L97.812 - Non-healing wound to Right medial lower leg Bacteria identified in Unspecified specimen by A LOINC Code: 634-6 Convenience Name: Aerobic culture-specimen not specified Hand Signature: Date(s): Electronic Signature(s) Signed: 09/15/2022 4:56:11 PM By: Duanne Guess MD FACS Signed: 09/15/2022 5:03:36 PM By: Tommie Ard RN Entered By: Tommie Ard on 09/15/2022 16:31:29 -------------------------------------------------------------------------------- Problem List Details Patient Name: Date of Service: Ryan [name] [place] R. 09/13/2022 10:30 A M Medical Record Number: [id number] Patient Account Number: [id number] Date of Birth/Sex: Treating RN: 09/05/1961 (61 y.o. M) Primary Care Provider: [name] Other Clinician: Referring Provider: Treating Provider/Extender: Mariane Baumgarten in Treatment: 0 Active Problems ICD-10 Encounter Code Description Active Date MDM Diagnosis [phone] Non-pressure chronic ulcer of other part of right  lower leg with fat layer 09/13/2022 No Yes exposed I10 Essential (primary) hypertension 09/13/2022 No Yes I48.92 Unspecified atrial flutter 09/13/2022 No Yes N18.30 Chronic kidney disease, stage 3 unspecified 09/13/2022 No Yes E11.622 Type 2 diabetes mellitus with other skin ulcer 09/13/2022 No Yes Inactive Problems Resolved Problems Electronic Signature(s) Signed: 09/13/2022 11:37:29 AM By: Duanne Guess MD FACS Previous Signature: 09/13/2022 11:22:40 AM Version By: Duanne Guess MD FACS Previous Signature: 09/13/2022 10:11:54 AM Version By: Duanne Guess MD FACS Entered By: Duanne Guess on 09/13/2022 11:37:29 -------------------------------------------------------------------------------- Progress Note Details Patient Name: Date of Service: Ryan [name] [place] R. 09/13/2022 10:30 A M Medical Record Number: [id number] Patient Account Number: [id number] Date of Birth/Sex: Treating RN: 1961/05/09 (61 y.o. M) Primary Care Provider: [name] Other Clinician: Referring Provider: Treating Provider/Extender: Mariane Baumgarten in Treatment: 0 Subjective Chief Complaint Information obtained from Patient Patients presents for treatment of an open diabetic ulcer on his medial right lower leg History of Present Illness (HPI) ADMISSION 10/01/2022 This is a 61 year old poorly controlled type II diabetic (last hemoglobin A1c 10%) with hypertension and history of substance abuse who presents to clinic today with a wound on his right medial lower leg. He says that he is not entirely sure how it started, but it has been present for about a month. He says that the wound and the area surrounding it are exquisitely tender. His primary care provider prescribed a steroid cream which she has been using, but he says it has not helped. ABI in clinic today was noncompressible, but he has easily palpable pulses. On the medial aspect of his right lower leg, there is a small wound  exposing the fat layer. There is slough and eschar on the surface. There is no odor or purulent drainage. Patient History Allergies No Known Allergies Family History Hypertension - Mother, No family history of Cancer, Diabetes, Heart Disease, Hereditary Spherocytosis, Kidney Disease, Lung Disease, Seizures, Stroke, Thyroid Problems, Tuberculosis. Social History Never smoker, Marital Status - Single, Alcohol Use - Moderate, Drug Use - No History, Caffeine Use - Moderate. Medical History Eyes Patient has history of Cataracts - Bila Denies history of Glaucoma, Optic Neuritis Ear/Nose/Mouth/Throat Denies history of Chronic sinus problems/congestion, Middle ear problems Hematologic/Lymphatic Denies history of Anemia, Hemophilia, Human Immunodeficiency Virus, Lymphedema, Sickle Cell Disease Respiratory Denies history of Aspiration, Asthma, Chronic Obstructive Pulmonary Disease (COPD), Pneumothorax, Sleep Apnea, Tuberculosis Cardiovascular Patient has history of Hypertension Endocrine Patient has history of Type II Diabetes Neurologic Denies history of Neuropathy Oncologic Denies history of Received Chemotherapy, Received Radiation Patient is treated with Insulin. Blood sugar is tested. Hospitalization/Surgery History - Esophagogastroduodenoscopy- 2021. - Achilles tendon surgery 2015. - Cardiac catheterization 2021. -  orbital fracture surgery 2000. Medical A Surgical History Notes nd Genitourinary CKD stage III Musculoskeletal Arthritis in back and shoulders Review of Systems (ROS) Constitutional Symptoms (General Health) Denies complaints or symptoms of Fatigue, Fever, Chills, Marked Weight Change. Eyes Complains or has symptoms of Glasses / Contacts - glasses. Denies complaints or symptoms of Dry Eyes, Vision Changes. Ear/Nose/Mouth/Throat Complains or has symptoms of Chronic sinus problems or rhinitis - seasonal allergies. Respiratory Denies complaints or symptoms of  Chronic or frequent coughs, Shortness of Breath. Cardiovascular Denies complaints or symptoms of Chest pain. Gastrointestinal Denies complaints or symptoms of Frequent diarrhea, Nausea, Vomiting. Integumentary (Skin) Rt medial leg Musculoskeletal Denies complaints or symptoms of Muscle Pain, Muscle Weakness. Neurologic Denies complaints or symptoms of Numbness/parasthesias. Psychiatric Denies complaints or symptoms of Claustrophobia. Objective Constitutional Slightly hypertensive. No acute distress. Vitals Time Taken: 10:16 AM, Height: 69 in, Source: Stated, Weight: 243 lbs, Source: Stated, BMI: 35.9, Temperature: 97.8 F, Pulse: 74 bpm, Respiratory Rate: 16 breaths/min, Blood Pressure: 143/92 mmHg, Capillary Blood Glucose: 200 mg/dl. Respiratory Normal work of breathing on room air.. Cardiovascular 2+ pitting edema to above the knees bilaterally. General Notes: 09/13/2022: On the medial aspect of his right lower leg, there is a small wound exposing the fat layer. There is slough and eschar on the surface. There is no odor or purulent drainage. Integumentary (Hair, Skin) Wound #1 status is Open. Original cause of wound was Blister. The date acquired was: 06/28/2022. The wound is located on the Right,Medial Lower Leg. The wound measures 0.8cm length x 0.5cm width x 0.2cm depth; 0.314cm^2 area and 0.063cm^3 volume. There is Fat Layer (Subcutaneous Tissue) exposed. There is no tunneling or undermining noted. There is a medium amount of serosanguineous drainage noted. There is medium (34-66%) red granulation within the wound bed. There is a medium (34-66%) amount of necrotic tissue within the wound bed including Eschar and Adherent Slough. Assessment Active Problems ICD-10 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ed Essential (primary) hypertension Unspecified atrial flutter Chronic kidney disease, stage 3 unspecified Type 2 diabetes mellitus with  other skin ulcer Procedures Wound #1 Pre-procedure diagnosis of Wound #1 is a Venous Leg Ulcer located on the Right,Medial Lower Leg .Severity of Tissue Pre Debridement is: Fat layer exposed. There was a Selective/Open Wound Non-Viable Tissue Debridement with a total area of 0.4 sq cm performed by Duanne Guess, MD. With the following instrument(s): Curette to remove Non-Viable tissue/material. Material removed includes Eschar and Slough and after achieving pain control using Lidocaine 5% topical ointment. 1 specimen was taken by a Tissue Culture and sent to the lab per facility protocol. A time out was conducted at 10:48, prior to the start of the procedure. A Minimum amount of bleeding was controlled with Pressure. The procedure was tolerated well with a pain level of 10 throughout and a pain level of 0 following the procedure. Post Debridement Measurements: 0.8cm length x 0.6cm width x 0.2cm depth; 0.075cm^3 volume. Character of Wound/Ulcer Post Debridement requires further debridement. Severity of Tissue Post Debridement is: Fat layer exposed. Post procedure Diagnosis Wound #1: Same as Pre-Procedure General Notes: Scribed for Dr. Lady Gary by Tommie Ard, RN. Pre-procedure diagnosis of Wound #1 is a Venous Leg Ulcer located on the Right,Medial Lower Leg . There was a Three Layer Compression Therapy Procedure by Tommie Ard, RN. Post procedure Diagnosis Wound #1: Same as Pre-Procedure Plan Follow-up Appointments: Return Appointment in 1 week. - Dr. Lady Gary Rm 4 Wednesday 09/22/2022 at 9:45AM  Anesthetic: (In clinic) Topical Lidocaine 5% applied to wound bed - in clinic, prior to debridement Bathing/ Shower/ Hygiene: May shower with protection but do not get wound dressing(s) wet. - use a cast protector that can be purchased at [employer] or [facility] Edema Control - Lymphedema / SCD / Other: Elevate legs to the level of the heart or above for 30 minutes daily and/or when sitting, a frequency  of: Avoid standing for long periods of time. - Take15 minute break every four hours while standing for long periods of time. Exercise regularly Moisturize legs daily. Laboratory ordered were: Aerobic culture-specimen not specified - Non-healing wound to Right medial lower leg WOUND #1: - Lower Leg Wound Laterality: Right, Medial Cleanser: Soap and Water Discharge Instructions: May shower and wash wound with dial antibacterial soap and water prior to dressing change. Peri-Wound Care: Sween Lotion (Moisturizing lotion) Discharge Instructions: Apply moisturizing lotion as directed Topical: Mupirocin Ointment Discharge Instructions: Apply Mupirocin (Bactroban) as instructed Prim Dressing: KerraCel Ag Gelling Fiber Dressing, 2x2 in (silver alginate) ary Discharge Instructions: Apply silver alginate to wound bed as instructed Secondary Dressing: Zetuvit Plus Silicone Border Dressing 5x5 (in/in) Discharge Instructions: Apply silicone border over primary dressing as directed. Secured With: 37M Medipore Scientist, research (life sciences) Surgical T 2x10 (in/yd) ape Discharge Instructions: Secure with tape as directed. Com pression Wrap: ThreePress (3 layer compression wrap) Discharge Instructions: Apply three layer compression as directed. 09/13/2022: This is a 61 year old type II diabetic with a wound of his at least 1 month's duration. On the medial aspect of his right lower leg, there is a small wound exposing the fat layer. There is slough and eschar on the surface. There is no odor or purulent drainage. I used a curette to debride slough and eschar from the wound. Due to the considerable pain he is experiencing, I am concerned that he may have a localized infection. I took a culture. We will apply topical mupirocin with silver alginate and 3 layer compression. Once his culture returns, I will tailor antibiotic therapy as indicated. He will return to clinic in 1 week. Electronic Signature(s) Signed: 09/15/2022 4:56:11  PM By: Duanne Guess MD FACS Signed: 09/15/2022 5:03:36 PM By: Tommie Ard RN Signed: 09/15/2022 5:03:36 PM By: Tommie Ard RN Previous Signature: 09/13/2022 11:38:19 AM Version By: Duanne Guess MD FACS Previous Signature: 09/13/2022 11:36:45 AM Version By: Duanne Guess MD FACS Entered By: Tommie Ard on 09/15/2022 16:31:53 -------------------------------------------------------------------------------- HxROS Details Patient Name: Date of Service: DRAYKE, GRABEL [place] R. 09/13/2022 10:30 A M Medical Record Number: [id number] Patient Account Number: [id number] Date of Birth/Sex: Treating RN: 09-Aug-1961 (62 y.o. [name] Primary Care Provider: [name] Other Clinician: Referring Provider: Treating Provider/Extender: Mariane Baumgarten in Treatment: 0 Constitutional Symptoms (General Health) Complaints and Symptoms: Negative for: Fatigue; Fever; Chills; Marked Weight Change Eyes Complaints and Symptoms: Positive for: Glasses / Contacts - glasses Negative for: Dry Eyes; Vision Changes Medical History: Positive for: Cataracts - Bila Negative for: Glaucoma; Optic Neuritis Ear/Nose/Mouth/Throat Complaints and Symptoms: Positive for: Chronic sinus problems or rhinitis - seasonal allergies Medical History: Negative for: Chronic sinus problems/congestion; Middle ear problems Respiratory Complaints and Symptoms: Negative for: Chronic or frequent coughs; Shortness of Breath Medical History: Negative for: Aspiration; Asthma; Chronic Obstructive Pulmonary Disease (COPD); Pneumothorax; Sleep Apnea; Tuberculosis Cardiovascular Complaints and Symptoms: Negative for: Chest pain Medical History: Positive for: Hypertension Gastrointestinal Complaints and Symptoms: Negative for: Frequent diarrhea; Nausea; Vomiting Musculoskeletal Complaints and Symptoms: Negative for: Muscle Pain; Muscle Weakness Medical History:  Past Medical History  Notes: Arthritis in back and shoulders Neurologic Complaints and Symptoms: Negative for: Numbness/parasthesias Medical History: Negative for: Neuropathy Psychiatric Complaints and Symptoms: Negative for: Claustrophobia Hematologic/Lymphatic Medical History: Negative for: Anemia; Hemophilia; Human Immunodeficiency Virus; Lymphedema; Sickle Cell Disease Endocrine Medical History: Positive for: Type II Diabetes Time with diabetes: over 30 years Treated with: Insulin Blood sugar tested every day: Yes Tested : 1 Genitourinary Medical History: Past Medical History Notes: CKD stage III Integumentary (Skin) Complaints and Symptoms: Review of System Notes: Rt medial leg Oncologic Medical History: Negative for: Received Chemotherapy; Received Radiation HBO Extended History Items Eyes: Cataracts Immunizations Pneumococcal Vaccine: Received Pneumococcal Vaccination: No Implantable Devices None Hospitalization / Surgery History Type of Hospitalization/Surgery Esophagogastroduodenoscopy- 2021 Achilles tendon surgery 2015 Cardiac catheterization 2021 orbital fracture surgery 2000 Family and Social History Cancer: No; Diabetes: No; Heart Disease: No; Hereditary Spherocytosis: No; Hypertension: Yes - Mother; Kidney Disease: No; Lung Disease: No; Seizures: No; Stroke: No; Thyroid Problems: No; Tuberculosis: No; Never smoker; Marital Status - Single; Alcohol Use: Moderate; Drug Use: No History; Caffeine Use: Moderate; Financial Concerns: No; Food, Clothing or Shelter Needs: No; Support System Lacking: No; Transportation Concerns: No Physician Affirmation I have reviewed and agree with the above information. Electronic Signature(s) Signed: 09/13/2022 12:03:04 PM By: Duanne Guess MD FACS Signed: 09/13/2022 4:21:20 PM By: Tommie Ard RN Entered By: Tommie Ard on 09/13/2022 10:24:24 -------------------------------------------------------------------------------- SuperBill  Details Patient Name: Date of Service: AXYL, [place]IT[place] R. 09/13/2022 Medical Record Number: [id number] Patient Account Number: [id number] Date of Birth/Sex: Treating RN: [birth year] (61 y.o. M) Primary Care Provider: [name] Other Clinician: Referring Provider: Treating Provider/Extender: Mariane Baumgarten in Treatment: 0 Diagnosis Coding ICD-10 Codes Code Description [phone] 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ed I10 Essential (primary) hypertension I48.92 Unspecified atrial flutter N18.30 Chronic kidney disease, stage 3 unspecified E11.622 Type 2 diabetes mellitus with other skin ulcer Facility Procedures CPT4 Code: [id number] Description: WOUND CARE VISIT-LEV 3 NEW PT Modifier: 25 Quantity: 1 CPT4 Code: [id number] Description: 97597 - DEBRIDE WOUND 1ST 20 SQ CM OR < ICD-10 Diagnosis Description L97.812 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 Modifier: ed Quantity: 1 Physician Procedures : CPT4 Code Description Modifier [id number] - WC PHYS LEVEL 4 - NEW PT 25 ICD-10 Diagnosis Description L97.812 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ed I10 Essential (primary) hypertension N18.30 Chronic  kidney disease, stage 3 unspecified E11.622 Type 2 diabetes mellitus with other skin ulcer Quantity: 1 : [id number] - WC PHYS DEBR WO ANESTH 20 SQ CM ICD-10 Diagnosis Description L97.812 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ed Quantity: 1 Electronic Signature(s) Signed: 09/13/2022 3:28:59 PM By: Duanne Guess MD FACS Signed: 09/13/2022 4:21:20 PM By: Tommie Ard RN Previous Signature: 09/13/2022 11:44:27 AM Version By: Duanne Guess MD FACS Previous Signature: 09/13/2022 11:37:07 AM Version By: Duanne Guess MD FACS Entered By: Tommie Ard on 09/13/2022 12:35:33

## 2022-09-13 NOTE — Progress Notes (Signed)
Ryan [name], Ryan [name] ([id number]) Visit Report for 09/13/2022 Allergy List Details Patient Name: Date of Service: TAJA, GABEL [facility] [place] R. 09/13/2022 10:30 A M Medical Record Number: [id number] Patient Account Number: [id number] Date of Birth/Sex: Treating RN: [birth year] (61 y.o. Waldron Session Primary Care Karon Heckendorn: Jack Quarto Other Clinician: Referring Brin Ruggerio: Treating Dawnya Grams/Extender: Ernestina Columbia in Treatment: 0 Allergies Active Allergies No Known Allergies Allergy Notes Electronic Signature(s) Signed: 09/13/2022 4:21:20 PM By: Blanche East RN Entered By: Blanche East on 09/13/2022 10:16:49 -------------------------------------------------------------------------------- Arrival Information Details Patient Name: Date of Service: Ryan [name] Ryan R. 09/13/2022 10:30 A M Medical Record Number: [id number] Patient Account Number: [id number] Date of Birth/Sex: Treating RN: September 30, 1961 (61 y.o. Waldron Session Primary Care Leodan Bolyard: Jack Quarto Other Clinician: Referring Raylie Maddison: Treating Dedee Liss/Extender: Ernestina Columbia in Treatment: 0 Visit Information Patient Arrived: Ambulatory Arrival Time: 10:15 Accompanied By: self Transfer Assistance: None Patient Identification Verified: Yes Secondary Verification Process Completed: Yes Patient Requires Transmission-Based Precautions: No Patient Has Alerts: No Electronic Signature(s) Signed: 09/13/2022 4:21:20 PM By: Blanche East RN Entered By: Blanche East on 09/13/2022 10:16:00 -------------------------------------------------------------------------------- Clinic Level of Care Assessment Details Patient Name: Date of Service: MARSHAWN, CLINE [facility] [place] R. 09/13/2022 10:30 A M Medical Record Number: [id number] Patient Account Number: [id number] Date of Birth/Sex: Treating RN: 19-Oct-1961 (61 y.o. Waldron Session Primary Care Benedetta Sundstrom: Jack Quarto Other Clinician: Referring  Capri Raben: Treating Jadakiss Barish/Extender: Ernestina Columbia in Treatment: 0 Clinic Level of Care Assessment Items TOOL 1 Quantity Score X- 1 0 Use when EandM and Procedure is performed on INITIAL visit ASSESSMENTS - Nursing Assessment / Reassessment X- 1 20 General Physical Exam (combine w/ comprehensive assessment (listed just below) when performed on new pt. evals) X- 1 25 Comprehensive Assessment (HX, ROS, Risk Assessments, Wounds Hx, etc.) ASSESSMENTS - Wound and Skin Assessment / Reassessment X- 1 10 Dermatologic / Skin Assessment (not related to wound area) ASSESSMENTS - Ostomy and/or Continence Assessment and Care []  - 0 Incontinence Assessment and Management []  - 0 Ostomy Care Assessment and Management (repouching, etc.) PROCESS - Coordination of Care X - Simple Patient / Family Education for ongoing care 1 15 []  - 0 Complex (extensive) Patient / Family Education for ongoing care X- 1 10 Staff obtains Programmer, systems, Records, T Results / Process Orders est X- 1 10 Staff telephones HHA, Nursing Homes / Clarify orders / etc X- 1 10 Routine Transfer to another Facility (non-emergent condition) []  - 0 Routine Hospital Admission (non-emergent condition) []  - 0 New Admissions / Biomedical engineer / Ordering NPWT Apligraf, etc. , []  - 0 Emergency Hospital Admission (emergent condition) PROCESS - Special Needs []  - 0 Pediatric / Minor Patient Management []  - 0 Isolation Patient Management []  - 0 Hearing / Language / Visual special needs []  - 0 Assessment of Community assistance (transportation, D/C planning, etc.) []  - 0 Additional assistance / Altered mentation []  - 0 Support Surface(s) Assessment (bed, cushion, seat, etc.) INTERVENTIONS - Miscellaneous []  - 0 External ear exam []  - 0 Patient Transfer (multiple staff / Civil Service fast streamer / Similar devices) []  - 0 Simple Staple / Suture removal (25 or less) []  - 0 Complex Staple / Suture removal (26  or more) []  - 0 Hypo/Hyperglycemic Management (do not check if billed separately) X- 1 15 Ankle / Brachial Index (ABI) - do not check if billed separately Has the patient been seen at the hospital within the last three years: Yes Total Score: 115 Level Of  Care: New/Established - Level 3 Electronic Signature(s) Signed: 09/13/2022 4:21:20 PM By: Blanche East RN Entered By: Blanche East on 09/13/2022 10:57:44 -------------------------------------------------------------------------------- Compression Therapy Details Patient Name: Date of Service: KACEN, MELLINGER Ryan R. 09/13/2022 10:30 A M Medical Record Number: [id number] Patient Account Number: [id number] Date of Birth/Sex: Treating RN: 02-19-1961 (61 y.o. Waldron Session Primary Care Analiya Porco: Jack Quarto Other Clinician: Referring Cira Deyoe: Treating Dicky Boer/Extender: Ernestina Columbia in Treatment: 0 Compression Therapy Performed for Wound Assessment: Wound #1 Right,Medial Lower Leg Performed By: Clinician Blanche East, RN Compression Type: Three Layer Post Procedure Diagnosis Same as Pre-procedure Electronic Signature(s) Signed: 09/13/2022 4:21:20 PM By: Blanche East RN Entered By: Blanche East on 09/13/2022 12:36:26 -------------------------------------------------------------------------------- Encounter Discharge Information Details Patient Name: Date of Service: Ryan [name] Ryan R. 09/13/2022 10:30 A M Medical Record Number: [id number] Patient Account Number: [id number] Date of Birth/Sex: Treating RN: 11-10-1961 (61 y.o. Waldron Session Primary Care Deven Furia: Jack Quarto Other Clinician: Referring Tarren Sabree: Treating Marithza Malachi/Extender: Ernestina Columbia in Treatment: 0 Encounter Discharge Information Items Post Procedure Vitals Discharge Condition: Stable Temperature (F): 97.8 Ambulatory Status: Ambulatory Pulse (bpm): 74 Discharge Destination: Home Respiratory  Rate (breaths/min): 16 Transportation: Private Auto Blood Pressure (mmHg): 143/92 Accompanied By: self Schedule Follow-up Appointment: Yes Clinical Summary of Care: Electronic Signature(s) Signed: 09/13/2022 4:21:20 PM By: Blanche East RN Entered By: Blanche East on 09/13/2022 11:21:00 -------------------------------------------------------------------------------- Lower Extremity Assessment Details Patient Name: Date of Service: Ryan [name], Ryan [name] Ryan R. 09/13/2022 10:30 A M Medical Record Number: [id number] Patient Account Number: [id number] Date of Birth/Sex: Treating RN: 17-May-1961 (61 y.o. Waldron Session Primary Care Annjeanette Sarwar: Jack Quarto Other Clinician: Referring Tobechukwu Emmick: Treating Akshat Minehart/Extender: Ernestina Columbia in Treatment: 0 Edema Assessment Assessed: [Left: No] [Right: No] E[Left: dema] [Right: :] Calf Left: Right: Point of Measurement: From Medial Instep 39 cm Ankle Left: Right: Point of Measurement: From Medial Instep 25.5 cm Vascular Assessment Pulses: Dorsalis Pedis Palpable: [Right:Yes Yes] Electronic Signature(s) Signed: 09/13/2022 4:21:20 PM By: Blanche East RN Entered By: Blanche East on 09/13/2022 10:33:19 -------------------------------------------------------------------------------- Multi Wound Chart Details Patient Name: Date of Service: Ryan [name] Ryan R. 09/13/2022 10:30 A M Medical Record Number: [id number] Patient Account Number: [id number] Date of Birth/Sex: Treating RN: 03-23-1961 (61 y.o. M) Primary Care Carollyn Etcheverry: Jack Quarto Other Clinician: Referring Raylie Maddison: Treating Karishma Unrein/Extender: Ernestina Columbia in Treatment: 0 Vital Signs Height(in): 69 Capillary Blood Glucose(mg/dl): 200 Weight(lbs): 243 Pulse(bpm): 74 Body Mass Index(BMI): 35.9 Blood Pressure(mmHg): 143/92 Temperature(F): 97.8 Respiratory Rate(breaths/min): 16 Photos: [N/A:N/A] Right, Medial Lower Leg N/A  N/A Wound Location: Blister N/A N/A Wounding Event: Venous Leg Ulcer N/A N/A Primary Etiology: Cataracts, Hypertension, Type II N/A N/A Comorbid History: Diabetes 06/28/2022 N/A N/A Date Acquired: 0 N/A N/A Weeks of Treatment: Open N/A N/A Wound Status: No N/A N/A Wound Recurrence: 0.8x0.5x0.2 N/A N/A Measurements L x W x D (cm) 0.314 N/A N/A A (cm) : rea 0.063 N/A N/A Volume (cm) : Full Thickness Without Exposed N/A N/A Classification: Support Structures Medium N/A N/A Exudate Amount: Serosanguineous N/A N/A Exudate Type: red, brown N/A N/A Exudate Color: Medium (34-66%) N/A N/A Granulation Amount: Red N/A N/A Granulation Quality: Medium (34-66%) N/A N/A Necrotic Amount: Eschar, Adherent Slough N/A N/A Necrotic Tissue: Fat Layer (Subcutaneous Tissue): Yes N/A N/A Exposed Structures: Fascia: No Tendon: No Muscle: No Joint: No Bone: No None N/A N/A Epithelialization: Debridement - Selective/Open Wound N/A N/A Debridement: Pre-procedure Verification/Time Out 10:48 N/A N/A Taken: Lidocaine 5% topical  ointment N/A N/A Pain Control: Necrotic/Eschar, Slough N/A N/A Tissue Debrided: Non-Viable Tissue N/A N/A Level: 0.4 N/A N/A Debridement A (sq cm): rea Curette N/A N/A Instrument: Minimum N/A N/A Bleeding: Pressure N/A N/A Hemostasis A chieved: 10 N/A N/A Procedural Pain: 0 N/A N/A Post Procedural Pain: Procedure was tolerated well N/A N/A Debridement Treatment Response: 0.8x0.6x0.2 N/A N/A Post Debridement Measurements L x W x D (cm) 0.075 N/A N/A Post Debridement Volume: (cm) Debridement N/A N/A Procedures Performed: Treatment Notes Wound #1 (Lower Leg) Wound Laterality: Right, Medial Cleanser Soap and Water Discharge Instruction: May shower and wash wound with dial antibacterial soap and water prior to dressing change. Peri-Wound Care Sween Lotion (Moisturizing lotion) Discharge Instruction: Apply moisturizing lotion as  directed Topical Mupirocin Ointment Discharge Instruction: Apply Mupirocin (Bactroban) as instructed Primary Dressing KerraCel Ag Gelling Fiber Dressing, 2x2 in (silver alginate) Discharge Instruction: Apply silver alginate to wound bed as instructed Secondary Dressing Zetuvit Plus Silicone Border Dressing 5x5 (in/in) Discharge Instruction: Apply silicone border over primary dressing as directed. Secured With [employer] Surgical T 2x10 (in/yd) ape Discharge Instruction: Secure with tape as directed. Compression Wrap ThreePress (3 layer compression wrap) Discharge Instruction: Apply three layer compression as directed. Compression Stockings Add-Ons Electronic Signature(s) Signed: 09/13/2022 11:22:51 AM By: Fredirick Maudlin MD FACS Entered By: Fredirick Maudlin on 09/13/2022 11:22:50 -------------------------------------------------------------------------------- Multi-Disciplinary Care Plan Details Patient Name: Date of Service: Ryan [name], Ryan [name] [place] 09/13/2022 10:30 A M Medical Record Number: [id number] Patient Account Number: [id number] Date of Birth/Sex: Treating RN: [birth year] (61 y.o. Waldron Session Primary Care Sarah-Jane Nazario: Jack Quarto Other Clinician: Referring Saadiq Poche: Treating Estellar Cadena/Extender: Ernestina Columbia in Treatment: 0 Active Inactive Nutrition Nursing Diagnoses: Potential for alteratiion in Nutrition/Potential for imbalanced nutrition Goals: Patient/caregiver will maintain therapeutic glucose control Date Initiated: 09/13/2022 Target Resolution Date: 10/18/2022 Goal Status: Active Interventions: Provide education on elevated blood sugars and impact on wound healing Provide education on nutrition Treatment Activities: Dietary management education, guidance and counseling : 09/13/2022 Notes: Orientation to the Wound Care Program Nursing Diagnoses: Knowledge deficit related to the wound healing center  program Goals: Patient/caregiver will verbalize understanding of the [place] Program Date Initiated: 09/13/2022 Target Resolution Date: 10/15/2022 Goal Status: Active Interventions: Provide education on orientation to the wound center Notes: Wound/Skin Impairment Nursing Diagnoses: Knowledge deficit related to ulceration/compromised skin integrity Goals: Ulcer/skin breakdown will have a volume reduction of 30% by week 4 Date Initiated: 09/13/2022 Target Resolution Date: 09/13/2022 Goal Status: Active Interventions: Provide education on ulcer and skin care Treatment Activities: Skin care regimen initiated : 09/13/2022 Notes: Electronic Signature(s) Signed: 09/13/2022 4:21:20 PM By: Blanche East RN Entered By: Blanche East on 09/13/2022 10:56:22 -------------------------------------------------------------------------------- Pain Assessment Details Patient Name: Date of Service: Ryan [name] 09/13/2022 10:30 A M Medical Record Number: [id number] Patient Account Number: [id number] Date of Birth/Sex: Treating RN: July 16, 1961 (61 y.o. Waldron Session Primary Care Cordale Manera: Jack Quarto Other Clinician: Referring Swetha Rayle: Treating Fiore Detjen/Extender: Ernestina Columbia in Treatment: 0 Active Problems Location of Pain Severity and Description of Pain Patient Has Paino Yes Site Locations Rate the pain. Current Pain Level: 10 Character of Pain Describe the Pain: Burning Pain Management and Medication Current Pain Management: Electronic Signature(s) Signed: 09/13/2022 4:21:20 PM By: Blanche East RN Entered By: Blanche East on 09/13/2022 10:40:51 -------------------------------------------------------------------------------- Patient/Caregiver Education Details Patient Name: Date of Service: Donnie Coffin 10/2/2023andnbsp10:30 [place] Record Number: [id number] Patient Account Number: [id number] Date of  Birth/Gender: Treating RN: 1961/09/13 (61 y.o. [name] Primary Care Physician: [name] Other Clinician: Referring Physician: Treating Physician/Extender: Mariane Baumgarten in Treatment: 0 Education Assessment Education Provided To: Patient Education Topics Provided Elevated Blood Sugar/ Impact on Healing: Handouts: Elevated Blood Sugars: How Do They Affect Wound Healing Methods: Explain/Verbal Responses: Refused information, State content correctly Nutrition: Handouts: Elevated Blood Sugars: How Do They Affect Wound Healing, Nutrition Methods: Explain/Verbal Responses: Reinforcements needed, State content correctly Welcome T [facility]: o Handouts: Welcome T [facility] o Methods: Explain/Verbal Responses: Reinforcements needed, State content correctly Wound/Skin Impairment: Handouts: Caring for Your Ulcer, Skin Care Do's and Dont's Methods: Explain/Verbal Responses: Reinforcements needed, State content correctly Electronic Signature(s) Signed: 09/13/2022 4:21:20 PM By: Tommie Ard RN Entered By: Tommie Ard on 09/13/2022 10:57:06 -------------------------------------------------------------------------------- Wound Assessment Details Patient Name: Date of Service: Ryan [name], Ryan [name] Ryan R. 09/13/2022 10:30 A M Medical Record Number: [id number] Patient Account Number: [id number] Date of Birth/Sex: Treating RN: 01-17-1961 (61 y.o. [name] Primary Care Leigh Kaeding: [name] Other Clinician: Referring Stellah Donovan: Treating Sonna Lipsky/Extender: Mariane Baumgarten in Treatment: 0 Wound Status Wound Number: 1 Primary Etiology: Venous Leg Ulcer Wound Location: Right, Medial Lower Leg Wound Status: Open Wounding Event: Blister Comorbid History: Cataracts, Hypertension, Type II Diabetes Date Acquired: 06/28/2022 Weeks Of Treatment: 0 Clustered Wound: No Photos Wound Measurements Length: (cm)  0.8 Width: (cm) 0.5 Depth: (cm) 0.2 Area: (cm) 0.314 Volume: (cm) 0.063 % Reduction in Area: % Reduction in Volume: Epithelialization: None Tunneling: No Undermining: No Wound Description Classification: Full Thickness Without Exposed Support Structures Exudate Amount: Medium Exudate Type: Serosanguineous Exudate Color: red, brown Foul Odor After Cleansing: No Slough/Fibrino Yes Wound Bed Granulation Amount: Medium (34-66%) Exposed Structure Granulation Quality: Red Fascia Exposed: No Necrotic Amount: Medium (34-66%) Fat Layer (Subcutaneous Tissue) Exposed: Yes Necrotic Quality: Eschar, Adherent Slough Tendon Exposed: No Muscle Exposed: No Joint Exposed: No Bone Exposed: No Treatment Notes Wound #1 (Lower Leg) Wound Laterality: Right, Medial Cleanser Soap and Water Discharge Instruction: May shower and wash wound with dial antibacterial soap and water prior to dressing change. Peri-Wound Care Sween Lotion (Moisturizing lotion) Discharge Instruction: Apply moisturizing lotion as directed Topical Mupirocin Ointment Discharge Instruction: Apply Mupirocin (Bactroban) as instructed Primary Dressing KerraCel Ag Gelling Fiber Dressing, 2x2 in (silver alginate) Discharge Instruction: Apply silver alginate to wound bed as instructed Secondary Dressing Zetuvit Plus Silicone Border Dressing 5x5 (in/in) Discharge Instruction: Apply silicone border over primary dressing as directed. Secured With [employer] T 2x10 (in/yd) ape Discharge Instruction: Secure with tape as directed. Compression Wrap ThreePress (3 layer compression wrap) Discharge Instruction: Apply three layer compression as directed. Compression Stockings Add-Ons Electronic Signature(s) Signed: 09/13/2022 4:21:20 PM By: Tommie Ard RN Entered By: Tommie Ard on 09/13/2022 10:40:06 -------------------------------------------------------------------------------- Vitals Details Patient  Name: Date of Service: Ryan [name] Ryan R. 09/13/2022 10:30 A M Medical Record Number: [id number] Patient Account Number: [id number] Date of Birth/Sex: Treating RN: Nov 16, 1961 (61 y.o. [name] Primary Care Rogelio Winbush: [name] Other Clinician: Referring Rosalie Gelpi: Treating Clete Kuch/Extender: Mariane Baumgarten in Treatment: 0 Vital Signs Time Taken: 10:16 Temperature (F): 97.8 Height (in): 69 Pulse (bpm): 74 Source: Stated Respiratory Rate (breaths/min): 16 Weight (lbs): 243 Blood Pressure (mmHg): 143/92 Source: Stated Capillary Blood Glucose (mg/dl): 945 Body Mass Index (BMI): 35.9 Reference Range: 80 - 120 mg / dl Electronic Signature(s) Signed: 09/13/2022 4:21:20 PM By: Tommie Ard RN Entered By: Tommie Ard  on 09/13/2022 10:16:40

## 2022-09-17 ENCOUNTER — Encounter (HOSPITAL_BASED_OUTPATIENT_CLINIC_OR_DEPARTMENT_OTHER): Payer: Self-pay | Admitting: Internal Medicine

## 2022-09-20 ENCOUNTER — Encounter (HOSPITAL_BASED_OUTPATIENT_CLINIC_OR_DEPARTMENT_OTHER): Payer: Self-pay | Admitting: Internal Medicine

## 2022-09-22 ENCOUNTER — Encounter (HOSPITAL_BASED_OUTPATIENT_CLINIC_OR_DEPARTMENT_OTHER): Payer: Self-pay | Admitting: General Surgery

## 2022-09-23 ENCOUNTER — Encounter (HOSPITAL_BASED_OUTPATIENT_CLINIC_OR_DEPARTMENT_OTHER): Payer: Self-pay | Admitting: General Surgery

## 2022-09-23 NOTE — Progress Notes (Signed)
Ryan [name] ([id number]) [id number] Page 1 of 5 Visit Report for 09/23/2022 Arrival Information Details Patient Name: Date of Service: Ryan [name], Ryan [name] [facility] [name] 09/23/2022 11:00 A M Medical Record Number: [id number] Patient Account Number: [id number] Date of Birth/Sex: Treating RN: [birth year] (61 y.o. [name] Primary Care Wadie Liew: [name] Other Clinician: Referring Minor Iden: Treating Hephzibah Strehle/Extender: Mariane Baumgarten in Treatment: 1 Visit Information History Since Last Visit Added or deleted any medications: No Patient Arrived: Ambulatory Any new allergies or adverse reactions: No Arrival Time: 11:19 Had a fall or experienced change in No Accompanied By: self activities of daily living that may affect Transfer Assistance: None risk of falls: Patient Identification Verified: Yes Signs or symptoms of abuse/neglect since last visito No Patient Requires Transmission-Based Precautions: No Hospitalized since last visit: No Patient Has Alerts: No Implantable device outside of the clinic excluding No cellular tissue based products placed in the center since last visit: Has Dressing in Place as Prescribed: Yes Has Compression in Place as Prescribed: Yes Pain Present Now: No Electronic Signature(s) Signed: 09/23/2022 5:50:33 PM By: Karie Schwalbe RN Entered By: Karie Schwalbe on 09/23/2022 11:29:09 -------------------------------------------------------------------------------- Compression Therapy Details Patient Name: Date of Service: Ryan [name], Ryan [name] 09/23/2022 11:00 A M Medical Record Number: [id number] Patient Account Number: [id number] Date of Birth/Sex: Treating RN: [birth year] (61 y.o. [name] Primary Care Rainey Rodger: [name] Other Clinician: Referring Parminder Cupples: Treating Shalaya Swailes/Extender: Mariane Baumgarten in Treatment: 1 Compression Therapy Performed for Wound  Assessment: Wound #1 Right,Medial Lower Leg Performed By: Clinician Karie Schwalbe, RN Compression Type: Three Layer Post Procedure Diagnosis Same as Pre-procedure Electronic Signature(s) Signed: 09/23/2022 5:50:33 PM By: Karie Schwalbe RN Entered By: Karie Schwalbe on 09/23/2022 11:51:55 Ryan [name] ([id number]) [id number] Page 2 of 5 -------------------------------------------------------------------------------- Lower Extremity Assessment Details Patient Name: Date of Service: Ryan [name] [facility] [name] 09/23/2022 11:00 A M Medical Record Number: [id number] Patient Account Number: [id number] Date of Birth/Sex: Treating RN: September 04, 1961 (61 y.o. [name] Primary Care Cesare Sumlin: [name] Other Clinician: Referring Tonjua Rossetti: Treating Delayna Sparlin/Extender: Mariane Baumgarten in Treatment: 1 Edema Assessment Assessed: [Left: No] [Right: No] [Left: Edema] [Right: :] Calf Left: Right: Point of Measurement: From Medial Instep 38.2 cm Ankle Left: Right: Point of Measurement: From Medial Instep 25.2 cm Vascular Assessment Pulses: Dorsalis Pedis Palpable: [Right:Yes] Electronic Signature(s) Signed: 09/23/2022 5:50:33 PM By: Karie Schwalbe RN Entered By: Karie Schwalbe on 09/23/2022 11:30:56 -------------------------------------------------------------------------------- Multi Wound Chart Details Patient Name: Date of Service: Ryan [name] [place] [name] 09/23/2022 11:00 A M Medical Record Number: [id number] Patient Account Number: [id number] Date of Birth/Sex: Treating RN: 1961-09-30 (61 y.o. M) Primary Care Cassi Jenne: [name] Other Clinician: Referring Javona Bergevin: Treating Gabrial Poppell/Extender: Mariane Baumgarten in Treatment: 1 Vital Signs Height(in): 69 Pulse(bpm): 73 Weight(lbs): 243 Blood Pressure(mmHg): 108/72 Body Mass Index(BMI): 35.9 Temperature(F): 97.9 Respiratory Rate(breaths/min):  16 [1:Photos:] [N/A:N/A] Right, Medial Lower Leg N/A N/A Wound Location: Blister N/A N/A Wounding Event: Venous Leg Ulcer N/A N/A Primary Etiology: Cataracts, Hypertension, Type II N/A N/A Comorbid History: Diabetes Ryan [name], Ryan [name] ([id number]) [id number] Page 3 of 5 06/28/2022 N/A N/A Date Acquired: 1 N/A N/A Weeks of Treatment: Open N/A N/A Wound Status: No N/A N/A Wound Recurrence: 0.3x0.3x0.1 N/A N/A Measurements L x W x D (cm) 0.071 N/A N/A A (cm) : rea 0.007 N/A N/A Volume (cm) : 77.40% N/A N/A % Reduction in Area: 88.90% N/A N/A % Reduction in Volume: Full Thickness Without Exposed  N/A N/A Classification: Support Structures Medium N/A N/A Exudate A mount: Serosanguineous N/A N/A Exudate Type: red, brown N/A N/A Exudate Color: None Present (0%) N/A N/A Granulation A mount: Large (67-100%) N/A N/A Necrotic A mount: Eschar, Adherent Slough N/A N/A Necrotic Tissue: Fat Layer (Subcutaneous Tissue): Yes N/A N/A Exposed Structures: Fascia: No Tendon: No Muscle: No Joint: No Bone: No None N/A N/A Epithelialization: Debridement - Selective/Open Wound N/A N/A Debridement: Pre-procedure Verification/Time Out 11:40 N/A N/A Taken: Lidocaine 5% topical ointment N/A N/A Pain Control: Necrotic/Eschar, Slough N/A N/A Tissue Debrided: Non-Viable Tissue N/A N/A Level: 0.09 N/A N/A Debridement A (sq cm): rea Curette N/A N/A Instrument: Minimum N/A N/A Bleeding: Pressure N/A N/A Hemostasis A chieved: 0 N/A N/A Procedural Pain: 0 N/A N/A Post Procedural Pain: Procedure was tolerated well N/A N/A Debridement Treatment Response: 0.3x0.3x0.1 N/A N/A Post Debridement Measurements L x W x D (cm) 0.007 N/A N/A Post Debridement Volume: (cm) Excoriation: No N/A N/A Periwound Skin Texture: Induration: No Callus: No Crepitus: No Rash: No Scarring: No Maceration: No N/A N/A Periwound Skin Moisture: Dry/Scaly:  No Atrophie Blanche: No N/A N/A Periwound Skin Color: Cyanosis: No Ecchymosis: No Erythema: No Hemosiderin Staining: No Mottled: No Pallor: No Rubor: No No Abnormality N/A N/A Temperature: Compression Therapy N/A N/A Procedures Performed: Debridement Treatment Notes Electronic Signature(s) Signed: 09/23/2022 11:58:29 AM By: Fredirick Maudlin MD FACS Entered By: Fredirick Maudlin on 09/23/2022 11:58:29 -------------------------------------------------------------------------------- Pain Assessment Details Patient Name: Date of Service: Ryan [name] [place] [name] 09/23/2022 11:00 A M Medical Record Number: [id number] Patient Account Number: [id number] Date of Birth/Sex: Treating RN: August 04, 1961 (61 y.o. Collene Gobble Primary Care Sarahgrace Broman: Jack Quarto Other Clinician: Referring Bralen Wiltgen: Treating Suleika Donavan/Extender: Ernestina Columbia in Treatment: Downs, Henry ([id number]) [id number] Page 4 of 5 Active Problems Location of Pain Severity and Description of Pain Patient Has Paino No Site Locations Pain Management and Medication Current Pain Management: Electronic Signature(s) Signed: 09/23/2022 5:50:33 PM By: Dellie Catholic RN Entered By: Dellie Catholic on 09/23/2022 11:29:39 -------------------------------------------------------------------------------- Wound Assessment Details Patient Name: Date of Service: Ryan [name], Ryan [name] [place] [name] 09/23/2022 11:00 A M Medical Record Number: [id number] Patient Account Number: [id number] Date of Birth/Sex: Treating RN: 13-Apr-1961 (61 y.o. Collene Gobble Primary Care Oliviarose Punch: Jack Quarto Other Clinician: Referring Ines Warf: Treating Serai Tukes/Extender: Ernestina Columbia in Treatment: 1 Wound Status Wound Number: 1 Primary Etiology: Venous Leg Ulcer Wound Location: Right, Medial Lower Leg Wound Status: Open Wounding Event: Blister Comorbid History:  Cataracts, Hypertension, Type II Diabetes Date Acquired: 06/28/2022 Weeks Of Treatment: 1 Clustered Wound: No Photos Wound Measurements Length: (cm) 0. Width: (cm) 0. Ryan [name], Ryan [name] ([id number]) Depth: (cm) 0. Area: (cm) 0 Volume: (cm) 0 3 % Reduction in Area: 77.4% 3 % Reduction in Volume: 88.9% [id number] Page 5 of 5 1 Epithelialization: None .071 Tunneling: No .007 Undermining: No Wound Description Classification: Full Thickness Without Exposed Support Structures Exudate Amount: Medium Exudate Type: Serosanguineous Exudate Color: red, brown Foul Odor After Cleansing: No Slough/Fibrino Yes Wound Bed Granulation Amount: None Present (0%) Exposed Structure Necrotic Amount: Large (67-100%) Fascia Exposed: No Necrotic Quality: Eschar, Adherent Slough Fat Layer (Subcutaneous Tissue) Exposed: Yes Tendon Exposed: No Muscle Exposed: No Joint Exposed: No Bone Exposed: No Periwound Skin Texture Texture Color No Abnormalities Noted: Yes No Abnormalities Noted: Yes Moisture Temperature / Pain No Abnormalities Noted: Yes Temperature: No Abnormality Electronic Signature(s) Signed: 09/23/2022 5:50:33 PM By: Dellie Catholic RN Entered By: Dellie Catholic on 09/23/2022 11:35:44 -------------------------------------------------------------------------------- Vitals  Details Patient Name: Date of Service: Ryan [name], Ryan [name] [facility] [name] 09/23/2022 11:00 A M Medical Record Number: [id number] Patient Account Number: [id number] Date of Birth/Sex: Treating RN: 11-Jul-1961 (61 y.o. [name] Primary Care Javonna Balli: [name] Other Clinician: Referring Makia Bossi: Treating Kevontay Burks/Extender: Mariane Baumgarten in Treatment: 1 Vital Signs Time Taken: 11:20 Temperature (F): 97.9 Height (in): 69 Pulse (bpm): 73 Weight (lbs): 243 Respiratory Rate (breaths/min): 16 Body Mass Index (BMI): 35.9 Blood Pressure (mmHg): 108/72 Reference  Range: 80 - 120 mg / dl Electronic Signature(s) Signed: 09/23/2022 5:50:33 PM By: Karie Schwalbe RN Entered By: Karie Schwalbe on 09/23/2022 11:29:33

## 2022-09-23 NOTE — Progress Notes (Signed)
LINFORD, QUINTELA ([id number]) [id number] Page 1 of 8 Visit Report for 09/23/2022 Chief Complaint Document Details Patient Name: Date of Service: TRENDEN, HAZELRIGG [facility] [place] R. 09/23/2022 11:00 A M Medical Record Number: [id number] Patient Account Number: [id number] Date of Birth/Sex: Treating RN: Jan 10, 1961 (61 y.o. M) Primary Care Provider: [name] Other Clinician: Referring Provider: Treating Provider/Extender: Mariane Baumgarten in Treatment: 1 Information Obtained from: Patient Chief Complaint Patients presents for treatment of an open diabetic ulcer on his medial right lower leg Electronic Signature(s) Signed: 09/23/2022 11:58:34 AM By: Duanne Guess MD FACS Entered By: Duanne Guess on 09/23/2022 11:58:33 -------------------------------------------------------------------------------- Debridement Details Patient Name: Date of Service: [name] [place] R. 09/23/2022 11:00 A M Medical Record Number: [id number] Patient Account Number: [id number] Date of Birth/Sex: Treating RN: 08/02/1961 (61 y.o. [name] Primary Care Provider: [name] Other Clinician: Referring Provider: Treating Provider/Extender: Mariane Baumgarten in Treatment: 1 Debridement Performed for Assessment: Wound #1 Right,Medial Lower Leg Performed By: Physician Duanne Guess, MD Debridement Type: Debridement Severity of Tissue Pre Debridement: Fat layer exposed Level of Consciousness (Pre-procedure): Awake and Alert Pre-procedure Verification/Time Out Yes - 11:40 Taken: Start Time: 11:40 Pain Control: Lidocaine 5% topical ointment T Area Debrided (L x W): otal 0.3 (cm) x 0.3 (cm) = 0.09 (cm) Tissue and other material debrided: Non-Viable, Eschar, Slough, Slough Level: Non-Viable Tissue Debridement Description: Selective/Open Wound Instrument: Curette Bleeding: Minimum Hemostasis Achieved: Pressure End Time:  11:42 Procedural Pain: 0 Post Procedural Pain: 0 Response to Treatment: Procedure was tolerated well Level of Consciousness (Post- Awake and Alert procedure): Post Debridement Measurements of Total Wound Length: (cm) 0.3 Width: (cm) 0.3 Depth: (cm) 0.1 Volume: (cm) 0.007 Character of Wound/Ulcer Post Debridement: Improved Severity of Tissue Post Debridement: Fat layer exposed DECKARD, STUBER R ([id number]) [id number] Page 2 of 8 Post Procedure Diagnosis Same as Pre-procedure Notes Scribed for Dr. Lady Gary by J.Scotton Electronic Signature(s) Signed: 09/23/2022 12:26:27 PM By: Duanne Guess MD FACS Signed: 09/23/2022 5:50:33 PM By: Karie Schwalbe RN Entered By: Karie Schwalbe on 09/23/2022 11:51:31 -------------------------------------------------------------------------------- HPI Details Patient Name: Date of Service: [name] [place] R. 09/23/2022 11:00 A M Medical Record Number: [id number] Patient Account Number: [id number] Date of Birth/Sex: Treating RN: 1961/10/10 (61 y.o. M) Primary Care Provider: [name] Other Clinician: Referring Provider: Treating Provider/Extender: Mariane Baumgarten in Treatment: 1 History of Present Illness HPI Description: ADMISSION 10/01/2022 This is a 61 year old poorly controlled type II diabetic (last hemoglobin A1c 10%) with hypertension and history of substance abuse who presents to clinic today with a wound on his right medial lower leg. He says that he is not entirely sure how it started, but it has been present for about a month. He says that the wound and the area surrounding it are exquisitely tender. His primary care provider prescribed a steroid cream which she has been using, but he says it has not helped. ABI in clinic today was noncompressible, but he has easily palpable pulses. On the medial aspect of his right lower leg, there is a small wound exposing the fat layer.  There is slough and eschar on the surface. There is no odor or purulent drainage. 09/23/2022: The PCR culture that I took last week grew out Escherichia coli. I prescribed a course of oral Augmentin and the patient is taking this currently. The wound is a little bit smaller and less tender. There is still slough and eschar buildup. Electronic Signature(s) Signed: 09/23/2022 11:59:10 AM By: Lady Gary,  Victorino Dike MD FACS Entered By: Duanne Guess on 09/23/2022 11:59:10 -------------------------------------------------------------------------------- Physical Exam Details Patient Name: Date of Service: REYAANSH, MERLO [facility] [place] R. 09/23/2022 11:00 A M Medical Record Number: [id number] Patient Account Number: [id number] Date of Birth/Sex: Treating RN: Mar 23, 1961 (61 y.o. M) Primary Care Provider: [name] Other Clinician: Referring Provider: Treating Provider/Extender: Mariane Baumgarten in Treatment: 1 Constitutional . . . . No acute distress.Marland Kitchen Respiratory Normal work of breathing on room air.. Notes 09/23/2022: The wound is a little bit smaller and less tender. There is still slough and eschar buildup. VEASNA, SANTIBANEZ ([id number]) [id number] Page 3 of 8 Electronic Signature(s) Signed: 09/23/2022 12:00:54 PM By: Duanne Guess MD FACS Entered By: Duanne Guess on 09/23/2022 12:00:53 -------------------------------------------------------------------------------- Physician Orders Details Patient Name: Date of Service: JRAKE, RODRIQUEZ [facility] [place] R. 09/23/2022 11:00 A M Medical Record Number: [id number] Patient Account Number: [id number] Date of Birth/Sex: Treating RN: January 02, 1961 (61 y.o. [name] Primary Care Provider: [name] Other Clinician: Referring Provider: Treating Provider/Extender: Mariane Baumgarten in Treatment: 1 Verbal / Phone Orders: No Diagnosis Coding ICD-10 Coding Code Description [phone]  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ed I10 Essential (primary) hypertension I48.92 Unspecified atrial flutter N18.30 Chronic kidney disease, stage 3 unspecified E11.622 Type 2 diabetes mellitus with other skin ulcer Follow-up Appointments ppointment in 1 week. - Dr. Lady Gary Rm 3 Friday 10/01/22 at 12:30pm Return A Anesthetic (In clinic) Topical Lidocaine 5% applied to wound bed - in clinic, prior to debridement Bathing/ Shower/ Hygiene May shower with protection but do not get wound dressing(s) wet. - use a cast protector that can be purchased at [employer] or [facility] Edema Control - Lymphedema / SCD / Other Elevate legs to the level of the heart or above for 30 minutes daily and/or when sitting, a frequency of: void standing for long periods of time. - T ake15 minute break every four hours while standing for long periods of time. A Exercise regularly Moisturize legs daily. Wound Treatment Wound #1 - Lower Leg Wound Laterality: Right, Medial Cleanser: Soap and Water Discharge Instructions: May shower and wash wound with dial antibacterial soap and water prior to dressing change. Peri-Wound Care: Sween Lotion (Moisturizing lotion) Discharge Instructions: Apply moisturizing lotion as directed Prim Dressing: KerraCel Ag Gelling Fiber Dressing, 2x2 in (silver alginate) ary Discharge Instructions: Apply silver alginate to wound bed as instructed Secondary Dressing: Zetuvit Plus Silicone Border Dressing 5x5 (in/in) Discharge Instructions: Apply silicone border over primary dressing as directed. Secured With: 35M Medipore Scientist, research (life sciences) Surgical T 2x10 (in/yd) ape Discharge Instructions: Secure with tape as directed. Compression Wrap: ThreePress (3 layer compression wrap) Discharge Instructions: Apply three layer compression as directed. Electronic Signature(s) Signed: 09/23/2022 12:26:27 PM By: Duanne Guess MD FACS Entered By: Duanne Guess on  09/23/2022 12:02:01 [name] ([id number]) [id number] Page 4 of 8 -------------------------------------------------------------------------------- Problem List Details Patient Name: Date of Service: ADAIN, GEURIN [facility] [place] R. 09/23/2022 11:00 A M Medical Record Number: [id number] Patient Account Number: [id number] Date of Birth/Sex: Treating RN: Sep 11, 1961 (61 y.o. M) Primary Care Provider: [name] Other Clinician: Referring Provider: Treating Provider/Extender: Mariane Baumgarten in Treatment: 1 Active Problems ICD-10 Encounter Code Description Active Date MDM Diagnosis [id number] Non-pressure chronic ulcer of other part of right lower leg with fat layer 09/13/2022 No Yes exposed I10 Essential (primary) hypertension 09/13/2022 No Yes I48.92 Unspecified atrial flutter 09/13/2022 No Yes N18.30 Chronic kidney disease, stage 3 unspecified 09/13/2022  No Yes E11.622 Type 2 diabetes mellitus with other skin ulcer 09/13/2022 No Yes Inactive Problems Resolved Problems Electronic Signature(s) Signed: 09/23/2022 11:58:23 AM By: Fredirick Maudlin MD FACS Entered By: Fredirick Maudlin on 09/23/2022 11:58:23 -------------------------------------------------------------------------------- Progress Note Details Patient Name: Date of Service: Marcelino Scot [place] R. 09/23/2022 11:00 A M Medical Record Number: [id number] Patient Account Number: [id number] Date of Birth/Sex: Treating RN: [birth year] (61 y.o. M) Primary Care Provider: Jack Quarto Other Clinician: Referring Provider: Treating Provider/Extender: Ernestina Columbia in Treatment: 1 Subjective Chief Complaint Information obtained from Patient Patients presents for treatment of an open diabetic ulcer on his medial right lower leg BRAD, LIEURANCE R ([id number]) [id number] Page 5 of 8 History of Present Illness  (HPI) ADMISSION 10/01/2022 This is a 61 year old poorly controlled type II diabetic (last hemoglobin A1c 10%) with hypertension and history of substance abuse who presents to clinic today with a wound on his right medial lower leg. He says that he is not entirely sure how it started, but it has been present for about a month. He says that the wound and the area surrounding it are exquisitely tender. His primary care provider prescribed a steroid cream which she has been using, but he says it has not helped. ABI in clinic today was noncompressible, but he has easily palpable pulses. On the medial aspect of his right lower leg, there is a small wound exposing the fat layer. There is slough and eschar on the surface. There is no odor or purulent drainage. 09/23/2022: The PCR culture that I took last week grew out Escherichia coli. I prescribed a course of oral Augmentin and the patient is taking this currently. The wound is a little bit smaller and less tender. There is still slough and eschar buildup. Patient History Family History Hypertension - Mother, No family history of Cancer, Diabetes, Heart Disease, Hereditary Spherocytosis, Kidney Disease, Lung Disease, Seizures, Stroke, Thyroid Problems, Tuberculosis. Social History Never smoker, Marital Status - Single, Alcohol Use - Moderate, Drug Use - No History, Caffeine Use - Moderate. Medical History Eyes Patient has history of Cataracts - Bila Denies history of Glaucoma, Optic Neuritis Ear/Nose/Mouth/Throat Denies history of Chronic sinus problems/congestion, Middle ear problems Hematologic/Lymphatic Denies history of Anemia, Hemophilia, Human Immunodeficiency Virus, Lymphedema, Sickle Cell Disease Respiratory Denies history of Aspiration, Asthma, Chronic Obstructive Pulmonary Disease (COPD), Pneumothorax, Sleep Apnea, Tuberculosis Cardiovascular Patient has history of Hypertension Endocrine Patient has history of Type II  Diabetes Neurologic Denies history of Neuropathy Oncologic Denies history of Received Chemotherapy, Received Radiation Hospitalization/Surgery History - Esophagogastroduodenoscopy- 2021. - Achilles tendon surgery 2015. - Cardiac catheterization 2021. - orbital fracture surgery 2000. Medical A Surgical History Notes nd Genitourinary CKD stage III Musculoskeletal Arthritis in back and shoulders Objective Constitutional No acute distress.. Vitals Time Taken: 11:20 AM, Height: 69 in, Weight: 243 lbs, BMI: 35.9, Temperature: 97.9 F, Pulse: 73 bpm, Respiratory Rate: 16 breaths/min, Blood Pressure: 108/72 mmHg. Respiratory Normal work of breathing on room air.. General Notes: 09/23/2022: The wound is a little bit smaller and less tender. There is still slough and eschar buildup. Integumentary (Hair, Skin) Wound #1 status is Open. Original cause of wound was Blister. The date acquired was: 06/28/2022. The wound has been in treatment 1 weeks. The wound is located on the Right,Medial Lower Leg. The wound measures 0.3cm length x 0.3cm width x 0.1cm depth; 0.071cm^2 area and 0.007cm^3 volume. There is Fat Layer (Subcutaneous Tissue) exposed. There is no tunneling or undermining noted. There is a medium amount  of serosanguineous drainage noted. There is no granulation within the wound bed. There is a large (67-100%) amount of necrotic tissue within the wound bed including Eschar and Adherent Slough. The periwound skin appearance had no abnormalities noted for texture. The periwound skin appearance had no abnormalities noted for moisture. The periwound skin appearance had no abnormalities noted for color. Periwound temperature was noted as No Abnormality. Assessment DEVARIS, QUIRK ([id number]) [id number] Page 6 of 8 Active Problems ICD-10 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ed Essential (primary)  hypertension Unspecified atrial flutter Chronic kidney disease, stage 3 unspecified Type 2 diabetes mellitus with other skin ulcer Procedures Wound #1 Pre-procedure diagnosis of Wound #1 is a Venous Leg Ulcer located on the Right,Medial Lower Leg .Severity of Tissue Pre Debridement is: Fat layer exposed. There was a Selective/Open Wound Non-Viable Tissue Debridement with a total area of 0.09 sq cm performed by Duanne Guess, MD. With the following instrument(s): Curette to remove Non-Viable tissue/material. Material removed includes Eschar and Slough and after achieving pain control using Lidocaine 5% topical ointment. No specimens were taken. A time out was conducted at 11:40, prior to the start of the procedure. A Minimum amount of bleeding was controlled with Pressure. The procedure was tolerated well with a pain level of 0 throughout and a pain level of 0 following the procedure. Post Debridement Measurements: 0.3cm length x 0.3cm width x 0.1cm depth; 0.007cm^3 volume. Character of Wound/Ulcer Post Debridement is improved. Severity of Tissue Post Debridement is: Fat layer exposed. Post procedure Diagnosis Wound #1: Same as Pre-Procedure General Notes: Scribed for Dr. Lady Gary by J.Scotton. Pre-procedure diagnosis of Wound #1 is a Venous Leg Ulcer located on the Right,Medial Lower Leg . There was a Three Layer Compression Therapy Procedure by Karie Schwalbe, RN. Post procedure Diagnosis Wound #1: Same as Pre-Procedure Plan Follow-up Appointments: Return Appointment in 1 week. - Dr. Lady Gary Rm 3 Friday 10/01/22 at 12:30pm Anesthetic: (In clinic) Topical Lidocaine 5% applied to wound bed - in clinic, prior to debridement Bathing/ Shower/ Hygiene: May shower with protection but do not get wound dressing(s) wet. - use a cast protector that can be purchased at [employer] or [facility] Edema Control - Lymphedema / SCD / Other: Elevate legs to the level of the heart or above for 30 minutes daily  and/or when sitting, a frequency of: Avoid standing for long periods of time. - Take15 minute break every four hours while standing for long periods of time. Exercise regularly Moisturize legs daily. WOUND #1: - Lower Leg Wound Laterality: Right, Medial Cleanser: Soap and Water Discharge Instructions: May shower and wash wound with dial antibacterial soap and water prior to dressing change. Peri-Wound Care: Sween Lotion (Moisturizing lotion) Discharge Instructions: Apply moisturizing lotion as directed Prim Dressing: KerraCel Ag Gelling Fiber Dressing, 2x2 in (silver alginate) ary Discharge Instructions: Apply silver alginate to wound bed as instructed Secondary Dressing: Zetuvit Plus Silicone Border Dressing 5x5 (in/in) Discharge Instructions: Apply silicone border over primary dressing as directed. Secured With: 9M Medipore Scientist, research (life sciences) Surgical T 2x10 (in/yd) ape Discharge Instructions: Secure with tape as directed. Com pression Wrap: ThreePress (3 layer compression wrap) Discharge Instructions: Apply three layer compression as directed. 09/23/2022: The wound is a little bit smaller and less tender. There is still slough and eschar buildup. I used a curette to debride slough and eschar from the wound. Given the culture data, we will discontinue mupirocin. He will continue and complete his course of oral Augmentin.  Continue silver alginate with 3 layer compression. Follow-up in 1 week. Electronic Signature(s) Signed: 09/23/2022 12:02:38 PM By: Duanne Guessannon, Corgan Mormile MD FACS Entered By: Duanne Guessannon, Jakyla Reza on 09/23/2022 12:02:38 Hortencia ConradiSTEEPLES, Aviel R ([id number]) [id number] Page 7 of 8 -------------------------------------------------------------------------------- HxROS Details Patient Name: Date of Service: Sammie BenchSTEEPLES, [place] [facility] [place]. 09/23/2022 11:00 A M Medical Record Number: [id number] Patient Account Number: [id number] Date of Birth/Sex: Treating RN: 05-10-1961 (61  y.o. M) Primary Care Provider: [name] HivesPlacey, Mary Other Clinician: Referring Provider: Treating Provider/Extender: Mariane Baumgartenannon, Berea Majkowski Placey, Mary Weeks in Treatment: 1 Eyes Medical History: Positive for: Cataracts - Bila Negative for: Glaucoma; Optic Neuritis Ear/Nose/Mouth/Throat Medical History: Negative for: Chronic sinus problems/congestion; Middle ear problems Hematologic/Lymphatic Medical History: Negative for: Anemia; Hemophilia; Human Immunodeficiency Virus; Lymphedema; Sickle Cell Disease Respiratory Medical History: Negative for: Aspiration; Asthma; Chronic Obstructive Pulmonary Disease (COPD); Pneumothorax; Sleep Apnea; Tuberculosis Cardiovascular Medical History: Positive for: Hypertension Endocrine Medical History: Positive for: Type II Diabetes Time with diabetes: over 30 years Treated with: Insulin Blood sugar tested every day: Yes Tested : 1 Genitourinary Medical History: Past Medical History Notes: CKD stage III Musculoskeletal Medical History: Past Medical History Notes: Arthritis in back and shoulders Neurologic Medical History: Negative for: Neuropathy Oncologic Medical History: Negative for: Received Chemotherapy; Received Radiation HBO Extended History Items Eyes: Cataracts Immunizations Pneumococcal Vaccine: Received Pneumococcal Vaccination: No Implantable Devices None Cecil CobbsSTEEPLES, Chord R ([id number]) [id number] Page 8 of 8 Hospitalization / Surgery History Type of Hospitalization/Surgery Esophagogastroduodenoscopy- 2021 Achilles tendon surgery 2015 Cardiac catheterization 2021 orbital fracture surgery 2000 Family and Social History Cancer: No; Diabetes: No; Heart Disease: No; Hereditary Spherocytosis: No; Hypertension: Yes - Mother; Kidney Disease: No; Lung Disease: No; Seizures: No; Stroke: No; Thyroid Problems: No; Tuberculosis: No; Never smoker; Marital Status - Single; Alcohol Use: Moderate; Drug Use: No  History; Caffeine Use: Moderate; Financial Concerns: No; Food, Clothing or Shelter Needs: No; Support System Lacking: No; Transportation Concerns: No Electronic Signature(s) Signed: 09/23/2022 12:26:27 PM By: Duanne Guessannon, Chelise Hanger MD FACS Entered By: Duanne Guessannon, Mouhamadou Gittleman on 09/23/2022 12:00:22 -------------------------------------------------------------------------------- SuperBill Details Patient Name: Date of Service: Noah DelaineSTEEPLES, A [facility] R. 09/23/2022 Medical Record Number: [id number] Patient Account Number: [id number] Date of Birth/Sex: Treating RN: 05-10-1961 (61 y.o. M) Primary Care Provider: [name] HivesPlacey, Mary Other Clinician: Referring Provider: Treating Provider/Extender: Mariane Baumgartenannon, Jya Hughston Placey, Mary Weeks in Treatment: 1 Diagnosis Coding ICD-10 Codes Code Description [phone] 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ed I10 Essential (primary) hypertension I48.92 Unspecified atrial flutter N18.30 Chronic kidney disease, stage 3 unspecified E11.622 Type 2 diabetes mellitus with other skin ulcer Facility Procedures : CPT4 Code: [id number] Description: 97597 - DEBRIDE WOUND 1ST 20 SQ CM OR < ICD-10 Diagnosis Description L97.812 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 Modifier: ed Quantity: 1 Physician Procedures : CPT4 Code Description Modifier [id number] 99214 - WC PHYS LEVEL 4 - EST PT 25 ICD-10 Diagnosis Description L97.812 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ed N18.30 Chronic kidney disease, stage 3 unspecified  E11.622 Type 2 diabetes mellitus with other skin ulcer I10 Essential (primary) hypertension Quantity: 1 : [phone] 97597 - WC PHYS DEBR WO ANESTH 20 SQ CM ICD-10 Diagnosis Description L97.812 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ed Quantity: 1 Electronic Signature(s) Signed: 09/23/2022 12:05:22 PM By: Duanne Guessannon, Clover Feehan  MD FACS Entered By: Duanne Guessannon, Ruqayyah Lute on 09/23/2022 12:05:21

## 2022-10-01 ENCOUNTER — Encounter (HOSPITAL_BASED_OUTPATIENT_CLINIC_OR_DEPARTMENT_OTHER): Payer: Self-pay | Admitting: Internal Medicine

## 2022-10-01 NOTE — Progress Notes (Signed)
Ryan [name], Ryan [name] ([id number]) [id number] Page 1 of 8 Visit Report for 10/01/2022 Arrival Information Details Patient Name: Date of Service: Ryan [name], Ryan [name] [facility] [name] 10/01/2022 12:30 PM Medical Record Number: [id number] Patient Account Number: [id number] Date of Birth/Sex: Treating RN: 01-31-1961 (61 y.o. Ryan [name] Primary Care Ryan [name]: Ryan [name] Other Clinician: Referring Ryan [name]: Treating Ryan [name]: Ryan [name]: 2 Visit Information History Since Last Visit Added or deleted any medications: No Patient Arrived: Ambulatory Any new allergies or adverse reactions: No Arrival Time: 12:52 Had a fall or experienced change in No Accompanied By: self activities of daily living that may affect Transfer Assistance: None risk of falls: Patient Identification Verified: Yes Signs or symptoms of abuse/neglect since last visito No Patient Requires Transmission-Based Precautions: No Hospitalized since last visit: No Patient Has Alerts: No Implantable device outside of the clinic excluding No cellular tissue based products placed in the center since last visit: Has Dressing in Place as Prescribed: Yes Has Compression in Place as Prescribed: Yes Pain Present Now: No Electronic Signature(s) Signed: 10/01/2022 5:25:16 PM By: Ryan Schwalbe RN Entered By: Ryan [name] on 10/01/2022 12:53:46 -------------------------------------------------------------------------------- Clinic Level of Care Assessment Details Patient Name: Date of Service: Ryan [name], Ryan [name] [name] 10/01/2022 12:30 PM Medical Record Number: [id number] Patient Account Number: [id number] Date of Birth/Sex: Treating RN: [birth year] (61 y.o. Ryan [name] Primary Care Allesandra Huebsch: Ryan [name] Other Clinician: Referring Ryan [name]: Treating Ryan [name]/Extender: Ryan [name]: 2 Clinic Level of Care Assessment  Items TOOL 4 Quantity Score X- 1 0 Use when only an EandM is performed on FOLLOW-UP visit ASSESSMENTS - Nursing Assessment / Reassessment X- 1 10 Reassessment of Co-morbidities (includes updates in patient status) X- 1 5 Reassessment of Adherence to [name] Plan ASSESSMENTS - Wound and Skin A ssessment / Reassessment X - Simple Wound Assessment / Reassessment - one wound 1 5 []  - 0 Complex Wound Assessment / Reassessment - multiple wounds []  - 0 Dermatologic / Skin Assessment (not related to wound area) ASSESSMENTS - Focused Assessment []  - 0 Circumferential Edema Measurements - multi extremities []  - 0 Nutritional Assessment / Counseling / Intervention Ryan [name], Ryan [name] ( ) [id number] Page 2 of 8 []  - 0 Lower Extremity Assessment (monofilament, tuning fork, pulses) []  - 0 Peripheral Arterial Disease Assessment (using hand held doppler) ASSESSMENTS - Ostomy and/or Continence Assessment and Care []  - 0 Incontinence Assessment and Management []  - 0 Ostomy Care Assessment and Management (repouching, etc.) PROCESS - Coordination of Care X - Simple Patient / Family Education for ongoing care 1 15 []  - 0 Complex (extensive) Patient / Family Education for ongoing care X- 1 10 Staff obtains , Records, T Results / Process Orders est X- 1 10 Staff telephones HHA, Nursing Homes / Clarify orders / etc []  - 0 Routine Transfer to another Facility (non-emergent condition) []  - 0 Routine Hospital Admission (non-emergent condition) []  - 0 New Admissions / Cecil Cobbs / Ordering NPWT Apligraf, etc. , []  - 0 Emergency Hospital Admission (emergent condition) X- 1 10 Simple Discharge Coordination []  - 0 Complex (extensive) Discharge Coordination PROCESS - Special Needs []  - 0 Pediatric / Minor Patient Management []  - 0 Isolation Patient Management []  - 0 Hearing / Language / Visual special needs []  - 0 Assessment of  Community assistance (transportation, D/C planning, etc.) []  - 0 Additional assistance / Altered mentation []  - 0 Support Surface(s) Assessment (bed, cushion, seat, etc.) INTERVENTIONS - Wound Cleansing / Measurement X -  Simple Wound Cleansing - one wound 1 5 []  - 0 Complex Wound Cleansing - multiple wounds X- 1 5 Wound Imaging (photographs - any number of wounds) []  - 0 Wound Tracing (instead of photographs) X- 1 5 Simple Wound Measurement - one wound []  - 0 Complex Wound Measurement - multiple wounds INTERVENTIONS - Wound Dressings []  - 0 Small Wound Dressing one or multiple wounds []  - 0 Medium Wound Dressing one or multiple wounds []  - 0 Large Wound Dressing one or multiple wounds []  - 0 Application of Medications - topical []  - 0 Application of Medications - injection INTERVENTIONS - Miscellaneous []  - 0 External ear exam []  - 0 Specimen Collection (cultures, biopsies, blood, body fluids, etc.) []  - 0 Specimen(s) / Culture(s) sent or taken to Lab for analysis []  - 0 Patient Transfer (multiple staff / / Similar devices) []  - 0 Simple Staple / Suture removal (25 or less) []  - 0 Complex Staple / Suture removal (26 or more) []  - 0 Hypo / Hyperglycemic Management (close monitor of Blood Glucose) Ryan [name], Ryan [name] ( ) [id number] Page 3 of 8 []  - 0 Ankle / Brachial Index (ABI) - do not check if billed separately X- 1 5 Vital Signs Has the patient been seen at the hospital within the last three years: Yes Total Score: 85 Level Of Care: New/Established - Level 3 Electronic Signature(s) Signed: 10/01/2022 5:25:16 PM By: RN Entered By: on 10/01/2022 13:14:23 -------------------------------------------------------------------------------- Encounter Discharge Information Details Patient Name: Date of Service: [place] [name] 10/01/2022 12:30 PM Medical Record Number:  Patient Account Number: Date of Birth/Sex: Treating RN: 01-24-1961 (61 y.o. Primary Care Rickardo Brinegar: Other Clinician: Referring Cressida Milford: Treating Kie Calvin/Extender: in [name]: 2 Encounter Discharge Information Items Discharge Condition: Stable Ambulatory Status: Ambulatory Discharge Destination: Home Transportation: Private Auto Accompanied By: self Schedule Follow-up Appointment: Yes Clinical Summary of Care: Patient Declined Electronic Signature(s) Signed: 10/01/2022 5:25:16 PM By: 11-23-2000 RN Entered By: on 10/01/2022 13:15:12 -------------------------------------------------------------------------------- Lower Extremity Assessment Details Patient Name: Date of Service: Ryan [name], Ryan [name] [place] [name] 10/01/2022 12:30 PM Medical Record Number: 10/03/2022 Patient Account Number: [name] Date of Birth/Sex: Treating RN: 08-04-1961 (61 y.o. [id number] Primary Care Kaylee Wombles: 10/29/1961 Other Clinician: Referring Tarsha Blando: Treating Carl Butner/Extender: 43 in [name]: 2 Edema Assessment Assessed: Ryan [name]: No] [Right: No] [Left: Edema] [Right: :] Calf Left: Right: Point of Measurement: 35 cm From Medial Instep 38 cm 39.2 cm Ankle Left: Right: Point of Measurement: 10 cm From Medial Instep 24 cm 23.5 cm Knee To Floor Left: Right: Ryan [name], Ryan [name] (Ryan [name]) [id number] Page 4 of 8 From Medial Instep 43 cm 43 cm Vascular Assessment Pulses: Dorsalis Pedis Palpable: [Right:Yes] Electronic Signature(s) Signed: 10/01/2022 5:25:16 PM By: Ryan Schwalbe RN Entered By: 10/03/2022 on 10/01/2022 13:06:43 -------------------------------------------------------------------------------- Multi Wound Chart Details Patient Name: Date of Service: 10/03/2022 [name] 10/01/2022 12:30 PM Medical Record  Number: [id number] Patient Account Number: 10/29/1961 Date of Birth/Sex: Treating RN: 09-20-1961 (61 y.o. Ryan [name] Primary Care Duriel Deery: Ryan [name] Other Clinician: Referring Titania Gault: Treating Arelia Volpe/Extender: Kyra Searles in [name]: 2 Vital Signs Height(in): 69 Pulse(bpm): 72 Weight(lbs): 243 Blood Pressure(mmHg): 122/77 Body Mass Index(BMI): 35.9 Temperature(F): 98 Respiratory Rate(breaths/min): 18 [1:Photos:] [N/A:N/A] Right, Medial Lower Leg N/A N/A Wound Location: Blister N/A N/A Wounding Event: Venous Leg Ulcer N/A N/A Primary Etiology: Cataracts, Hypertension,  Type II N/A N/A Comorbid History: Diabetes 06/28/2022 N/A N/A Date Acquired: 2 N/A N/A Weeks of [name]: Healed - Epithelialized N/A N/A Wound Status: No N/A N/A Wound Recurrence: 0x0x0 N/A N/A Measurements L x W x D (cm) 0 N/A N/A A (cm) : rea 0 N/A N/A Volume (cm) : 100.00% N/A N/A % Reduction in Area: 100.00% N/A N/A % Reduction in Volume: Full Thickness Without Exposed N/A N/A Classification: Support Structures None Present N/A N/A Exudate Amount: None Present (0%) N/A N/A Granulation Amount: None Present (0%) N/A N/A Necrotic Amount: Fascia: No N/A N/A Exposed Structures: Fat Layer (Subcutaneous Tissue): No Tendon: No Muscle: No Joint: No Bone: No Large (67-100%) N/A N/A Epithelialization: Excoriation: No N/A N/A Periwound Skin Texture: Induration: No Callus: No Crepitus: No Ryan [name], Ryan [name] ([id number]) [id number] Page 5 of 8 Rash: No Scarring: No Maceration: No N/A N/A Periwound Skin Moisture: Dry/Scaly: No Atrophie Blanche: No N/A N/A Periwound Skin Color: Cyanosis: No Ecchymosis: No Erythema: No Hemosiderin Staining: No Mottled: No Pallor: No Rubor: No No Abnormality N/A N/A Temperature: [name] Notes Electronic Signature(s) Signed: 10/01/2022 4:47:23 PM By: Linton Ham  MD Signed: 10/01/2022 5:25:16 PM By: Dellie Catholic RN Entered By: Linton Ham on 10/01/2022 13:27:39 -------------------------------------------------------------------------------- Multi-Disciplinary Care Plan Details Patient Name: Date of Service: Ryan [name], Ryan [name] [place] 10/01/2022 12:30 PM Medical Record Number: [id number] Patient Account Number: [id number] Date of Birth/Sex: Treating RN: 08-12-1961 (61 y.o. Collene Gobble Primary Care Sherika Kubicki: Jack Quarto Other Clinician: Referring Trashaun Streight: Treating Paula Zietz/Extender: Denice Bors in [name]: 2 Active Inactive Electronic Signature(s) Signed: 10/01/2022 5:25:16 PM By: Dellie Catholic RN Entered By: Dellie Catholic on 10/01/2022 13:12:52 -------------------------------------------------------------------------------- Pain Assessment Details Patient Name: Date of Service: Ryan [name], Ryan [name] [place] [name] 10/01/2022 12:30 PM Medical Record Number: [id number] Patient Account Number: [id number] Date of Birth/Sex: Treating RN: 14-Mar-1961 (61 y.o. Collene Gobble Primary Care Dupree Givler: Jack Quarto Other Clinician: Referring Dionis Autry: Treating Volney Reierson/Extender: Denice Bors in [name]: 2 Active Problems Location of Pain Severity and Description of Pain Patient Has Paino No Site Locations [place], [place] ([id number]) [id number] Page 6 of 8 Pain Management and Medication Current Pain Management: Electronic Signature(s) Signed: 10/01/2022 5:25:16 PM By: Dellie Catholic RN Entered By: Dellie Catholic on 10/01/2022 12:54:50 -------------------------------------------------------------------------------- Patient/Caregiver Education Details Patient Name: Date of Service: Donnie Coffin. 10/20/2023andnbsp12:30 PM Medical Record Number: [id number] Patient Account Number: [id number] Date of Birth/Gender: Treating RN: Apr 18, 1961 (61 y.o. Collene Gobble Primary Care Physician: Jack Quarto Other Clinician: Referring Physician: Treating Physician/Extender: Denice Bors in [name]: 2 Education Assessment Education Provided To: Patient Education Topics Provided Wound/Skin Impairment: Methods: Explain/Verbal Responses: Return demonstration correctly Electronic Signature(s) Signed: 10/01/2022 5:25:16 PM By: Dellie Catholic RN Entered By: Dellie Catholic on 10/01/2022 13:13:10 -------------------------------------------------------------------------------- Wound Assessment Details Patient Name: Date of Service: Ryan [name], Ryan [name] [place] [name] 10/01/2022 12:30 PM Medical Record Number: [id number] Patient Account Number: [id number] Date of Birth/Sex: Treating RN: 03/04/1961 (61 y.o. Collene Gobble Primary Care Khristine Verno: Jack Quarto Other Clinician: Referring Carly Sabo: Treating Elisha Mcgruder/Extender: Shaka, Cardin, Carver Fila ([id number]) [phone].pdf Page 7 of 8 Weeks in [name]: 2 Wound Status Wound Number: 1 Primary Etiology: Venous Leg Ulcer Wound Location: Right, Medial Lower Leg Wound Status: Healed - Epithelialized Wounding Event: Blister Comorbid History: Cataracts, Hypertension, Type II Diabetes Date Acquired: 06/28/2022 Weeks Of [name]: 2 Clustered Wound: No Photos Wound Measurements Length: (cm) Width: (cm) Depth: (cm) Area: (cm) Volume: (cm) 0 % Reduction  in Area: 100% 0 % Reduction in Volume: 100% 0 Epithelialization: Large (67-100%) 0 Tunneling: No 0 Undermining: No Wound Description Classification: Full Thickness Without Exposed Support Structures Exudate Amount: None Present Foul Odor After Cleansing: No Slough/Fibrino No Wound Bed Granulation Amount: None Present (0%) Exposed Structure Necrotic Amount: None Present (0%) Fascia Exposed: No Fat Layer (Subcutaneous Tissue) Exposed: No Tendon Exposed: No Muscle  Exposed: No Joint Exposed: No Bone Exposed: No Periwound Skin Texture Texture Color No Abnormalities Noted: Yes No Abnormalities Noted: Yes Moisture Temperature / Pain No Abnormalities Noted: Yes Temperature: No Abnormality Electronic Signature(s) Signed: 10/01/2022 5:25:16 PM By: Ryan Schwalbe RN Entered By: Ryan [name] on 10/01/2022 13:08:24 -------------------------------------------------------------------------------- Vitals Details Patient Name: Date of Service: [name] [place] [name] 10/01/2022 12:30 PM Medical Record Number: [id number] Patient Account Number: [id number] Date of Birth/Sex: Treating RN: 08-19-1961 (61 y.o. Ryan [name] Primary Care Aravind Chrismer: Ryan [name] Other Clinician: Referring Shatia Sindoni: Treating Oriel Rumbold/Extender: Ryan [name]: 2 [place], [place] ([id number]) [id number] Page 8 of 8 Vital Signs Time Taken: 12:52 Temperature (F): 98 Height (in): 69 Pulse (bpm): 72 Weight (lbs): 243 Respiratory Rate (breaths/min): 18 Body Mass Index (BMI): 35.9 Blood Pressure (mmHg): 122/77 Reference Range: 80 - 120 mg / dl Electronic Signature(s) Signed: 10/01/2022 5:25:16 PM By: Ryan Schwalbe RN Entered By: Ryan [name] on 10/01/2022 12:54:08

## 2022-10-01 NOTE — Progress Notes (Signed)
Ryan Ryan [name] ([id number]) [id number] Page 1 of 4 Visit Report for 10/01/2022 HPI Details Patient Name: Date of Service: Ryan Ryan [name], Ryan Ryan [name] [facility] [place] Ryan [name] 10/01/2022 12:30 PM Medical Record Number: [id number] Patient Account Number: [id number] Date of Birth/Sex: Treating RN: 1961/01/15 (61 y.o. Ryan Ryan [name] Primary Care Provider: [name] [name] Other Clinician: Referring Provider: Treating Provider/Extender: Ryan Ryan [name] in Treatment: 2 History of Present Illness HPI Description: ADMISSION 10/01/2022 This is a 61 year old poorly controlled type II diabetic (last hemoglobin A1c 10%) with Ryan [name] and history of substance abuse who presents to clinic today with a wound on his right medial lower leg. He says that he is not entirely sure how it started, but it has been present for about a month. He says that the wound and the area surrounding it are exquisitely tender. His primary care provider prescribed a steroid cream which she has been using, but he says it has not helped. ABI in clinic today was noncompressible, but he has easily palpable pulses. On the medial aspect of his right lower leg, there is a small wound exposing the fat layer. There is slough and eschar on the surface. There is no odor or purulent drainage. 09/23/2022: The PCR culture that I took last week grew out Escherichia coli. I prescribed a course of oral Augmentin and the patient is taking this currently. The wound is a little bit smaller and less tender. There is still slough and eschar buildup. 10/20; the patient's wound is closed today. He has chronic venous insufficiency. He did not come in with any compression stockings Electronic Signature(s) Signed: 10/01/2022 4:47:23 PM By: Ryan Najjar MD Entered By: Ryan Ryan [name] on 10/01/2022 13:30:01 -------------------------------------------------------------------------------- Physical Exam Details Patient Name:  Date of Service: Ryan Ryan [name], Ryan Ryan [name] [place] Ryan [name] 10/01/2022 12:30 PM Medical Record Number: [id number] Patient Account Number: [id number] Date of Birth/Sex: Treating RN: 1961-09-21 (62 y.o. Ryan Ryan [name] Primary Care Provider: [name] [name] Other Clinician: Referring Provider: Treating Provider/Extender: Ryan Ryan [name] in Treatment: 2 Constitutional Sitting or standing Blood Pressure is within target range for patient.. Pulse regular and within target range for patient.Marland Kitchen Respirations regular, non-labored and within target range.. Temperature is normal and within the target range for the patient.Marland Kitchen Appears in no distress. Notes Wound exam; the wound is closed. Under illumination it looks healthy. Significant venous Ryan [name] and stasis dermatitis but no obvious evidence of infection. He does not have uncontrolled edema his peripheral pulses are palpable Electronic Signature(s) Signed: 10/01/2022 4:47:23 PM By: Ryan Najjar MD Entered By: Ryan Ryan [name] on 10/01/2022 13:30:55 Ryan Ryan [name] Ryan [name] ([id number]) [id number] Page 2 of 4 -------------------------------------------------------------------------------- Physician Orders Details Patient Name: Date of Service: Ryan Ryan [name], Ryan Ryan [name] [place] Ryan [name] 10/01/2022 12:30 PM Medical Record Number: [id number] Patient Account Number: [id number] Date of Birth/Sex: Treating RN: 03-09-1961 (61 y.o. Ryan Ryan [name] Primary Care Provider: [name] [name] Other Clinician: Referring Provider: Treating Provider/Extender: Ryan Ryan [name] in Treatment: 2 Verbal / Phone Orders: No Diagnosis Coding Discharge From [facility] Discharge from Wound Care Center - Congratulations on your wound healing! Electronic Signature(s) Signed: 10/01/2022 4:47:23 PM By: Ryan Najjar MD Signed: 10/01/2022 5:25:16 PM By: Ryan Schwalbe RN Entered By: Ryan Ryan [name] on 10/01/2022  13:12:34 -------------------------------------------------------------------------------- Problem List Details Patient Name: Date of Service: Ryan Ryan [name], Ryan Ryan [name] [place] Ryan [name] 10/01/2022 12:30 PM Medical Record Number: [id number] Patient Account Number: [id number] Date of Birth/Sex: Treating RN: 12/23/1960 (61 y.o. Ryan Ryan [name] Primary Care Provider: [name] [name] Other Clinician: Referring Provider: Treating Provider/Extender: Ryan Macho,  Ryan [name] Ryan [name] in Treatment: 2 Active Problems ICD-10 Encounter Code Description Active Date MDM Diagnosis L97.812 Non-pressure chronic ulcer of other part of right lower leg with fat layer 09/13/2022 No Yes exposed Ryan Ryan [name] 09/13/2022 No Yes I48.92 Unspecified atrial flutter 09/13/2022 No Yes N18.30 Chronic kidney disease, stage 3 unspecified 09/13/2022 No Yes E11.622 Type 2 diabetes mellitus with other skin ulcer 09/13/2022 No Yes Inactive Problems Resolved Problems Ryan Ryan [name], Ryan Ryan [name] ([id number]) [id number] Page 3 of 4 Electronic Signature(s) Signed: 10/01/2022 4:47:23 PM By: Ryan Ham MD Entered By: Ryan Ryan [name] on 10/01/2022 13:27:26 -------------------------------------------------------------------------------- Progress Note Details Patient Name: Date of Service: Ryan Ryan [name], Ryan Ryan [name] [place] Ryan [name] 10/01/2022 12:30 PM Medical Record Number: [id number] Patient Account Number: [id number] Date of Birth/Sex: Treating RN: [birth year] (61 y.o. Ryan Ryan [name] Primary Care Provider: Jack [name] Other Clinician: Referring Provider: Treating Provider/Extender: Ryan Ryan [name] in Treatment: 2 Subjective History of Present Illness (HPI) ADMISSION 10/01/2022 This is a 61 year old poorly controlled type II diabetic (last hemoglobin A1c 10%) with Ryan [name] and history of substance abuse who presents to clinic today with a wound on his right medial lower leg. He says  that he is not entirely sure how it started, but it has been present for about a month. He says that the wound and the area surrounding it are exquisitely tender. His primary care provider prescribed a steroid cream which she has been using, but he says it has not helped. ABI in clinic today was noncompressible, but he has easily palpable pulses. On the medial aspect of his right lower leg, there is a small wound exposing the fat layer. There is slough and eschar on the surface. There is no odor or purulent drainage. 09/23/2022: The PCR culture that I took last week grew out Escherichia coli. I prescribed a course of oral Augmentin and the patient is taking this currently. The wound is a little bit smaller and less tender. There is still slough and eschar buildup. 10/20; the patient's wound is closed today. He has chronic venous insufficiency. He did not come in with any compression stockings Objective Constitutional Sitting or standing Blood Pressure is within target range for patient.. Pulse regular and within target range for patient.Marland Kitchen Respirations regular, non-labored and within target range.. Temperature is normal and within the target range for the patient.Marland Kitchen Appears in no distress. Vitals Time Taken: 12:52 PM, Height: 69 in, Weight: 243 lbs, BMI: 35.9, Temperature: 98 F, Pulse: 72 bpm, Respiratory Rate: 18 breaths/min, Blood Pressure: 122/77 mmHg. General Notes: Wound exam; the wound is closed. Under illumination it looks healthy. Significant venous Ryan [name] and stasis dermatitis but no obvious evidence of infection. He does not have uncontrolled edema his peripheral pulses are palpable Integumentary (Hair, Skin) Wound #1 status is Healed - Epithelialized. Original cause of wound was Blister. The date acquired was: 06/28/2022. The wound has been in treatment 2 weeks. The wound is located on the Right,Medial Lower Leg. The wound measures 0cm length x 0cm width x 0cm depth; 0cm^2 area and  0cm^3 volume. There is no tunneling or undermining noted. There is a none present amount of drainage noted. There is no granulation within the wound bed. There is no necrotic tissue within the wound bed. The periwound skin appearance had no abnormalities noted for texture. The periwound skin appearance had no abnormalities noted for moisture. The periwound skin appearance had no abnormalities noted for color. Periwound temperature was noted as No Abnormality. Assessment Active Problems ICD-10 Non-pressure chronic ulcer of other  part of right lower leg with fat layer exposed Essential (primary) Ryan [name] Unspecified atrial flutter Chronic kidney disease, stage 3 unspecified Type 2 diabetes mellitus with other skin ulcer Ryan Ryan [name], Ryan Ryan [name] ([id number]) [id number] Page 4 of 4 Plan Discharge From [facility] Services: Discharge from Wound Care Center - Congratulations on your wound healing! 1. The patient be discharged from the wound care center. We talked about 20/30 below-knee stockings I went over this with him also skin moisturizer. I am not sure about the likelihood for compliance Electronic Signature(s) Signed: 10/01/2022 4:47:23 PM By: Ryan Najjar MD Entered By: Ryan Ryan [name] on 10/01/2022 13:32:01 -------------------------------------------------------------------------------- SuperBill Details Patient Name: Date of Service: Ryan Ryan [name] 10/01/2022 Medical Record Number: [id number] Patient Account Number: [id number] Date of Birth/Sex: Treating RN: 1961/10/07 (61 y.o. Ryan Ryan [name] Primary Care Provider: [name] [name] Other Clinician: Referring Provider: Treating Provider/Extender: Ryan Ryan [name] in Treatment: 2 Diagnosis Coding ICD-10 Codes Code Description [phone] 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ed I10 Essential (primary) Ryan [name] I48.92 Unspecified atrial  flutter N18.30 Chronic kidney disease, stage 3 unspecified E11.622 Type 2 diabetes mellitus with other skin ulcer Facility Procedures : CPT4 Code: [id number] Description: 99213 - WOUND CARE VISIT-LEV 3 EST PT Modifier: Quantity: 1 Physician Procedures : CPT4 Code Description Modifier [id number] - WC PHYS LEVEL 2 - EST PT ICD-10 Diagnosis Description L97.812 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ed Quantity: 1 Electronic Signature(s) Signed: 10/01/2022 4:47:23 PM By: Ryan Najjar MD Entered By: Ryan Ryan [name] on 10/01/2022 13:32:19

## 2022-10-14 ENCOUNTER — Encounter (HOSPITAL_BASED_OUTPATIENT_CLINIC_OR_DEPARTMENT_OTHER): Payer: Self-pay | Admitting: General Surgery

## 2022-10-14 ENCOUNTER — Encounter (HOSPITAL_BASED_OUTPATIENT_CLINIC_OR_DEPARTMENT_OTHER): Payer: Self-pay | Attending: General Surgery | Admitting: General Surgery

## 2023-02-02 ENCOUNTER — Ambulatory Visit (HOSPITAL_COMMUNITY)
Admission: EM | Admit: 2023-02-02 | Discharge: 2023-02-02 | Disposition: A | Discharge status: Home / Self Care | Payer: No Typology Code available for payment source | Attending: Sports Medicine | Admitting: Sports Medicine

## 2023-02-02 ENCOUNTER — Encounter (HOSPITAL_COMMUNITY): Payer: Self-pay

## 2023-02-02 DIAGNOSIS — E1122 Type 2 diabetes mellitus with diabetic chronic kidney disease: Secondary | ICD-10-CM | POA: Diagnosis not present

## 2023-02-02 DIAGNOSIS — Z794 Long term (current) use of insulin: Secondary | ICD-10-CM

## 2023-02-02 DIAGNOSIS — R600 Localized edema: Secondary | ICD-10-CM | POA: Diagnosis not present

## 2023-02-02 DIAGNOSIS — N1832 Chronic kidney disease, stage 3b: Secondary | ICD-10-CM

## 2023-02-02 MED ORDER — KETOROLAC TROMETHAMINE 30 MG/ML IJ SOLN
30.0000 mg | Freq: Once | INTRAMUSCULAR | Status: AC
  Administered 2023-02-02: 30 mg via INTRAMUSCULAR

## 2023-02-02 MED ORDER — METHYLPREDNISOLONE SODIUM SUCC 125 MG IJ SOLR
80.0000 mg | Freq: Once | INTRAMUSCULAR | Status: AC
  Administered 2023-02-02: 80 mg via INTRAMUSCULAR

## 2023-02-02 MED ORDER — METHYLPREDNISOLONE SODIUM SUCC 125 MG IJ SOLR
INTRAMUSCULAR | Status: AC
  Filled 2023-02-02: qty 2

## 2023-02-02 MED ORDER — FUROSEMIDE 80 MG PO TABS: 80.0000 mg | ORAL_TABLET | Freq: Every day | ORAL | 0 refills | Status: DC

## 2023-02-02 MED ORDER — KETOROLAC TROMETHAMINE 30 MG/ML IJ SOLN
INTRAMUSCULAR | Status: AC
  Filled 2023-02-02: qty 1

## 2023-02-02 NOTE — ED Provider Notes (Signed)
[facility]   RR:[id number] 02/02/23 Arrival Time: 0809  ASSESSMENT & PLAN:  1. Type 2 diabetes mellitus with stage 3b chronic kidney disease, with long-term current use of insulin ([place])   2. Bilateral edema of lower extremity    -This is a chronic problem with exacerbation for the patient.  He has chronic kidney disease stage IIIb that has led to bilateral pitting edema.  The pressure from edema in his legs is causing significant pain on the right side.  I will give him an intramuscular shot of Toradol and Depo-Medrol today for acute pain relief.  Counseled to watch for transient elevation in sugars. I will increase his Lasix dose to 80 mg a day over the next 7 days to try to influence more diuresis. Recommended compression stockings and elevation. I strongly encouraged him to follow-up with primary care doctor for longitudinal maintenance of this.  I will give him the information for the [facility].  All his questions were answered and agrees to plan.  Meds ordered this encounter  Medications   ketorolac (TORADOL) 30 MG/ML injection 30 mg   methylPREDNISolone sodium succinate (SOLU-MEDROL) 125 mg/2 mL injection 80 mg   furosemide (LASIX) 80 MG tablet    Sig: Take 1 tablet (80 mg total) by mouth daily for 7 days.    Dispense:  7 tablet    Refill:  0     Discharge Instructions      Take Lasix 33m daily for 7 days to decrease swelling [facility] - they can help you with long term management of leg swelling!      Follow-up Information     Schedule an appointment as soon as possible for a visit  with DOrvis Brill DO.   Specialty: Family Medicine Contact information: [phone]                 Reviewed expectations re: course of current medical issues. Questions answered. Outlined signs and symptoms indicating need for more acute intervention. Patient  verbalized understanding. After Visit Summary given.   SUBJECTIVE: Pleasant 62year old male with past medical history of type 2 diabetes, hypertension, renal insufficiency comes urgent care to be evaluated for right leg pain.  He has had issues with this leg for a number of months now.  He was previously followed by the wound center.  He had an infection there several months ago positive for E. coli and was treated with antibiotics.  Today he just reports pain over the distal medial aspect of the right leg.  He describes it as a sharp pressure-like pain.  He denies any redness in the leg.  There is lots of swelling.  He previously been taking a water pill but says they were not working.  He gets all of his care at the resource center.  He does not have a primary care provider.     No LMP for male patient. Past Surgical History:  Procedure Laterality Date   ACHILLES TENDON SURGERY Right 05/02/2014   Procedure: RIGHT ACHILLES TENDON REPAIR;  Surgeon: JJohnn Hai MD;  Location: [facility];  Service: Orthopedics;  Laterality: Right;   BIOPSY  07/25/2020   Procedure: BIOPSY;  Surgeon: BOtis Brace MD;  [facility];  Service: Gastroenterology;;   CARDIAC CATHETERIZATION  05.31.2012   showing patent coronaries with no visualized vasospasm and EF of 55-60 %   ESOPHAGOGASTRODUODENOSCOPY N/A 07/25/2020  Procedure: ESOPHAGOGASTRODUODENOSCOPY (EGD);  Surgeon: Otis Brace, MD;  Location: [facility]COPY;  Service: Gastroenterology;  Laterality: N/A;   ORBITAL FRACTURE SURGERY Left 2-3 yrs ago     OBJECTIVE:  Vitals:   02/02/23 0853  BP: (!) 161/83  Pulse: 97  Resp: 16  Temp: 98.3 F (36.8 C)  TempSrc: Oral  SpO2: 98%     Physical Exam Vitals and nursing note reviewed.  Constitutional:      General: He is not in acute distress.    Appearance: Normal appearance.  HENT:     Head: Normocephalic.  Cardiovascular:     Rate and Rhythm: Normal rate and regular rhythm.      Pulses: Normal pulses.  Pulmonary:     Effort: Pulmonary effort is normal.     Breath sounds: Normal breath sounds.  Musculoskeletal:     Comments: R Leg -there is signs of chronic edema with flaking and scaling of the skin throughout the distal aspect of the lower extremity all the way to the level of the foot.  There is onychomycosis on all of the toes.  There is 2+ pitting edema up to the level of the proximal tibia.  He is tender to palpation at the distal part of the leg just proximal to the medial malleolus.  There is no erythema or ulcers.  Range of motion the ankle is full.  Dorsalis pedis pulses 2+.  Sensation is intact to light touch throughout the leg.  L Leg -2+ pitting edema up to the level of the proximal tibia.  Neurological:     Mental Status: He is alert.      Labs: Results for orders placed or performed during the hospital encounter of 07/26/22  Urine Culture   Specimen: Urine, Clean Catch  Result Value Ref Range   Specimen Description URINE, CLEAN CATCH    Special Requests NONE    Culture      NO GROWTH Performed at [facility], [place] [address] [place], [place] [address]    Report Status 07/28/2022 FINAL   Urinalysis, Routine w reflex microscopic Urine, Clean Catch  Result Value Ref Range   Color, Urine YELLOW YELLOW   APPearance CLEAR CLEAR   Specific Gravity, Urine 1.021 1.005 - 1.030   pH 5.0 5.0 - 8.0   Glucose, UA NEGATIVE NEGATIVE mg/dL   Hgb urine dipstick NEGATIVE NEGATIVE   Bilirubin Urine NEGATIVE NEGATIVE   Ketones, ur NEGATIVE NEGATIVE mg/dL   Protein, ur 30 (A) NEGATIVE mg/dL   Nitrite NEGATIVE NEGATIVE   Leukocytes,Ua NEGATIVE NEGATIVE   RBC / HPF 0-5 0 - 5 RBC/hpf   WBC, UA 0-5 0 - 5 WBC/hpf   Bacteria, UA NONE SEEN NONE SEEN   Mucus PRESENT    Hyaline Casts, UA PRESENT   Basic metabolic panel  Result Value Ref Range   Sodium 136 135 - 145 mmol/L   Potassium 3.0 (L) 3.5 - 5.1 mmol/L   Chloride 105 98 - 111 mmol/L   CO2 22 22 -  32 mmol/L   Glucose, Bld 160 (H) 70 - 99 mg/dL   BUN 17 6 - 20 mg/dL   Creatinine, Ser 1.90 (H) 0.61 - 1.24 mg/dL   Calcium 8.7 (L) 8.9 - 10.3 mg/dL   GFR, Estimated 40 (L) >60 mL/min   Anion gap 9 5 - 15  CBC with Differential  Result Value Ref Range   WBC 5.8 4.0 - 10.5 K/uL   RBC 4.75 4.22 - 5.81 MIL/uL  Hemoglobin 13.5 13.0 - 17.0 g/dL   HCT 39.4 39.0 - 52.0 %   MCV 82.9 80.0 - 100.0 fL   MCH 28.4 26.0 - 34.0 pg   MCHC 34.3 30.0 - 36.0 g/dL   RDW 14.9 11.5 - 15.5 %   Platelets 214 150 - 400 K/uL   nRBC 0.0 0.0 - 0.2 %   Neutrophils Relative % 47 %   Neutro Abs 2.8 1.7 - 7.7 K/uL   Lymphocytes Relative 39 %   Lymphs Abs 2.3 0.7 - 4.0 K/uL   Monocytes Relative 11 %   Monocytes Absolute 0.6 0.1 - 1.0 K/uL   Eosinophils Relative 2 %   Eosinophils Absolute 0.1 0.0 - 0.5 K/uL   Basophils Relative 1 %   Basophils Absolute 0.0 0.0 - 0.1 K/uL   Immature Granulocytes 0 %   Abs Immature Granulocytes 0.02 0.00 - 0.07 K/uL   Labs Reviewed - No data to display  Imaging: No results found.   No Known Allergies                                             Past Medical History:  Diagnosis Date   Achilles tendon rupture right   Arthritis    back and shoulders   Atrial flutter with rapid ventricular response ([place]) 07/22/2020   Chest pain    due to cocaine use   Diabetes mellitus without complication (HCC)    Drug abuse (HCC)    cocaine, marijuana abuse   GERD (gastroesophageal reflux disease)    Hypertension    Renal insufficiency     Social History   Socioeconomic History   Marital status: Single    Spouse name: Not on file   Number of children: Not on file   Years of education: Not on file   Highest education level: Not on file  Occupational History   Not on file  Tobacco Use   Smoking status: Never   Smokeless tobacco: Never  Vaping Use   Vaping Use: Never used  Substance and Sexual Activity   Alcohol use: Yes    Alcohol/week: 2.0 standard drinks of alcohol     Types: 2 Shots of liquor per week    Comment: 2 shots once week 01/06/22   Drug use: Not Currently    Types: Cocaine, Marijuana    Comment: last cocaine use 1 year ago, last marijuana use 4 to 5 years ago 01/06/22   Sexual activity: Not on file  Other Topics Concern   Not on file  Social History Narrative   ** Merged History Encounter **       Pt currently lives with his brother, and his sister in [place]. He is employed by [employer]. He denies tobacco use. He does endorse occasional alcohol use. He does endorse cocaine use.  Family history : Non contributory   Social Determinants of Radio broadcast assistant Strain: Not on file  Food Insecurity: Not on file  Transportation Needs: Not on file  Physical Activity: Not on file  Stress: Not on file  Social Connections: Not on file  Intimate Partner Violence: Not on file    Family History  Problem Relation Age of Onset   Unexplained death Mother 27       She died in Mar 19, 2016, he does not remember what she died of   Unexplained death  Father        He has no information on his father      Dortha Kern, MD 02/02/23 1023

## 2023-02-02 NOTE — Discharge Instructions (Signed)
Take Lasix 52m daily for 7 days to decrease swelling Call the [facility] - they can help you with long term management of leg swelling!

## 2023-02-09 ENCOUNTER — Emergency Department (HOSPITAL_COMMUNITY)
Admission: EM | Admit: 2023-02-09 | Discharge: 2023-02-09 | Disposition: A | Discharge status: Home / Self Care | Payer: No Typology Code available for payment source | Attending: Emergency Medicine | Admitting: Emergency Medicine

## 2023-02-09 ENCOUNTER — Encounter (HOSPITAL_COMMUNITY): Payer: Self-pay

## 2023-02-09 ENCOUNTER — Other Ambulatory Visit: Payer: Self-pay

## 2023-02-09 ENCOUNTER — Emergency Department (HOSPITAL_COMMUNITY): Payer: No Typology Code available for payment source

## 2023-02-09 DIAGNOSIS — Z7901 Long term (current) use of anticoagulants: Secondary | ICD-10-CM | POA: Diagnosis not present

## 2023-02-09 DIAGNOSIS — E119 Type 2 diabetes mellitus without complications: Secondary | ICD-10-CM

## 2023-02-09 DIAGNOSIS — Z7984 Long term (current) use of oral hypoglycemic drugs: Secondary | ICD-10-CM | POA: Insufficient documentation

## 2023-02-09 DIAGNOSIS — E876 Hypokalemia: Secondary | ICD-10-CM | POA: Insufficient documentation

## 2023-02-09 DIAGNOSIS — Z79899 Other long term (current) drug therapy: Secondary | ICD-10-CM | POA: Diagnosis not present

## 2023-02-09 DIAGNOSIS — Z8616 Personal history of COVID-19: Secondary | ICD-10-CM | POA: Diagnosis not present

## 2023-02-09 DIAGNOSIS — I129 Hypertensive chronic kidney disease with stage 1 through stage 4 chronic kidney disease, or unspecified chronic kidney disease: Secondary | ICD-10-CM | POA: Insufficient documentation

## 2023-02-09 DIAGNOSIS — N183 Chronic kidney disease, stage 3 unspecified: Secondary | ICD-10-CM | POA: Diagnosis present

## 2023-02-09 DIAGNOSIS — N1831 Chronic kidney disease, stage 3a: Secondary | ICD-10-CM | POA: Diagnosis not present

## 2023-02-09 DIAGNOSIS — I4892 Unspecified atrial flutter: Secondary | ICD-10-CM | POA: Diagnosis present

## 2023-02-09 DIAGNOSIS — R079 Chest pain, unspecified: Secondary | ICD-10-CM | POA: Diagnosis present

## 2023-02-09 DIAGNOSIS — E1122 Type 2 diabetes mellitus with diabetic chronic kidney disease: Secondary | ICD-10-CM | POA: Diagnosis not present

## 2023-02-09 DIAGNOSIS — I10 Essential (primary) hypertension: Secondary | ICD-10-CM | POA: Diagnosis present

## 2023-02-09 DIAGNOSIS — R55 Syncope and collapse: Secondary | ICD-10-CM | POA: Diagnosis not present

## 2023-02-09 DIAGNOSIS — Z794 Long term (current) use of insulin: Secondary | ICD-10-CM | POA: Insufficient documentation

## 2023-02-09 LAB — CBC WITH DIFFERENTIAL/PLATELET
Abs Immature Granulocytes: 0 10*3/uL (ref 0.00–0.07)
Basophils Absolute: 0 10*3/uL (ref 0.0–0.1)
Basophils Relative: 1 %
Eosinophils Absolute: 0.1 10*3/uL (ref 0.0–0.5)
Eosinophils Relative: 2 %
HCT: 39.3 % (ref 39.0–52.0)
Hemoglobin: 13.1 g/dL (ref 13.0–17.0)
Immature Granulocytes: 0 %
Lymphocytes Relative: 54 %
Lymphs Abs: 2.5 10*3/uL (ref 0.7–4.0)
MCH: 27.9 pg (ref 26.0–34.0)
MCHC: 33.3 g/dL (ref 30.0–36.0)
MCV: 83.8 fL (ref 80.0–100.0)
Monocytes Absolute: 0.6 10*3/uL (ref 0.1–1.0)
Monocytes Relative: 12 %
Neutro Abs: 1.5 10*3/uL — ABNORMAL LOW (ref 1.7–7.7)
Neutrophils Relative %: 31 %
Platelets: 213 10*3/uL (ref 150–400)
RBC: 4.69 MIL/uL (ref 4.22–5.81)
RDW: 14.7 % (ref 11.5–15.5)
WBC: 4.7 10*3/uL (ref 4.0–10.5)
nRBC: 0 % (ref 0.0–0.2)

## 2023-02-09 LAB — COMPREHENSIVE METABOLIC PANEL
ALT: 15 U/L (ref 0–44)
AST: 19 U/L (ref 15–41)
Albumin: 3.4 g/dL — ABNORMAL LOW (ref 3.5–5.0)
Alkaline Phosphatase: 78 U/L (ref 38–126)
Anion gap: 12 (ref 5–15)
BUN: 13 mg/dL (ref 6–20)
CO2: 21 mmol/L — ABNORMAL LOW (ref 22–32)
Calcium: 8.7 mg/dL — ABNORMAL LOW (ref 8.9–10.3)
Chloride: 105 mmol/L (ref 98–111)
Creatinine, Ser: 1.29 mg/dL — ABNORMAL HIGH (ref 0.61–1.24)
GFR, Estimated: >60 mL/min (ref >60)
Glucose, Bld: 193 mg/dL — ABNORMAL HIGH (ref 70–99)
Potassium: 3.2 mmol/L — ABNORMAL LOW (ref 3.5–5.1)
Sodium: 138 mmol/L (ref 135–145)
Total Bilirubin: 0.6 mg/dL (ref 0.3–1.2)
Total Protein: 7.3 g/dL (ref 6.5–8.1)

## 2023-02-09 LAB — RAPID URINE DRUG SCREEN, HOSP PERFORMED
Amphetamines: NOT DETECTED
Barbiturates: NOT DETECTED
Benzodiazepines: NOT DETECTED
Cocaine: POSITIVE — AB
Opiates: NOT DETECTED
Tetrahydrocannabinol: NOT DETECTED

## 2023-02-09 LAB — TROPONIN I (HIGH SENSITIVITY)
Troponin I (High Sensitivity): 14 ng/L (ref <18)
Troponin I (High Sensitivity): 13 ng/L (ref <18)

## 2023-02-09 LAB — MAGNESIUM: Magnesium: 2 mg/dL (ref 1.7–2.4)

## 2023-02-09 LAB — BRAIN NATRIURETIC PEPTIDE: B Natriuretic Peptide: 95.1 pg/mL (ref 0.0–100.0)

## 2023-02-09 MED ORDER — KETOROLAC TROMETHAMINE 30 MG/ML IJ SOLN
15.0000 mg | Freq: Once | INTRAMUSCULAR | Status: AC
  Administered 2023-02-09: 15 mg via INTRAVENOUS
  Filled 2023-02-09: qty 1

## 2023-02-09 MED ORDER — IOHEXOL 350 MG/ML SOLN
75.0000 mL | Freq: Once | INTRAVENOUS | Status: AC | PRN
  Administered 2023-02-09: 75 mL via INTRAVENOUS

## 2023-02-09 MED ORDER — DIPHENHYDRAMINE HCL 50 MG/ML IJ SOLN
25.0000 mg | Freq: Once | INTRAMUSCULAR | Status: AC
  Administered 2023-02-09: 25 mg via INTRAVENOUS
  Filled 2023-02-09: qty 1

## 2023-02-09 MED ORDER — POTASSIUM CHLORIDE CRYS ER 20 MEQ PO TBCR
40.0000 meq | EXTENDED_RELEASE_TABLET | Freq: Once | ORAL | Status: AC
  Administered 2023-02-09: 40 meq via ORAL
  Filled 2023-02-09: qty 2

## 2023-02-09 MED ORDER — PROCHLORPERAZINE EDISYLATE 10 MG/2ML IJ SOLN
10.0000 mg | Freq: Once | INTRAMUSCULAR | Status: AC
  Administered 2023-02-09: 10 mg via INTRAVENOUS
  Filled 2023-02-09: qty 2

## 2023-02-09 MED ORDER — FENTANYL CITRATE PF 50 MCG/ML IJ SOSY
25.0000 ug | PREFILLED_SYRINGE | Freq: Once | INTRAMUSCULAR | Status: AC
  Administered 2023-02-09: 25 ug via INTRAVENOUS
  Filled 2023-02-09: qty 1

## 2023-02-09 NOTE — Consult Note (Addendum)
Cardiology Consultation   Patient ID: Ryan [name] MRN: [id number]; DOB: Oct 20, 1961  Admit date: 02/09/2023 Date of Consult: 02/09/2023  PCP:  Marliss Coots, NP   [facility] Cardiologist:  Pixie Casino, MD        Patient Profile:   Ryan [name] is a 62 y.o. male with a hx of atrial flutter, chest pain related to cocaine use, polysubstance abuse (cocaine and marijuana), hypertension, DM2 and CKD who is being seen 02/09/2023 for the evaluation of syncope and chest pain at the request of Dr. Vanita Panda.  History of Present Illness:   Ryan [name] is a 62 year old male with past medical history of atrial flutter, chest pain related to cocaine use, polysubstance abuse (cocaine and marijuana), hypertension, DM2 and CKD.  Last echocardiogram obtained on 07/22/2020 showed EF 65 to 70%, no regional wall motion abnormality, mild LVH.  He was last seen by A-fib clinic in January 2023 at which point he was out of his medications.  By the time he was seen by general cardiology service in February 2023, he was noted to be in atrial flutter with RVR.  She also mentions he was not taking the Eliquis due to cost issue.  Patient was subsequently sent to the ED for further evaluation.  Heart rate improved to 70s after IV bolus of diltiazem.  Patient was discharged from the ED to follow-up with cardiology service which did not happen.  He was seen in the ED again on 05/06/2022, serial troponin was negative.  EKG shows he has converted back to sinus rhythm.  He returned to the ED in July 2023 with complaint of lower extremity edema.  A venous ultrasound was negative for DVT.  More recently, patient was seen at [facility] urgent care on 02/02/2023 with lower extremity edema with significant pain.  He received Toradol and methylprednisolone.  He was instructed to increase Lasix to 80 mg daily (note, patient was on '40mg'$  BID of lasix).  80 mg daily of Lasix has not changed  to lower extremity edema.  Patient was at work at [employer] this morning, shortly after o'clock a.m. around 7 AM, he started having right-sided chest pain that eventually migrated to the left side.  The chest pain waxed and waned however did not completely go away.  He felt dizzy and was sitting down when he passed out for few seconds.  He denies any other prodromal symptoms such as shortness of breath, spinning of the room, diaphoresis or flushing sensation prior to passing out.  [facility] for further evaluation.  Initial blood work showed potassium 3.2, creatinine 1.29 (previous creatinine 1.7-1.9).  BNP 95.  Serial troponin negative 14--> 13.  Red blood cell count normal.  Glucose of 193.  Chest x-ray showed cardiomegaly, low lung volume, no acute finding.  EKG showed sinus rhythm with T wave inversion in the inferior leads, unchanged when compared to the previous EKG from May 2023.  Cardiology service consulted for chest pain and syncope.  At the time of interview, dizziness has completely resolved.  Telemetry shows no significant bradycardia, tachycardia, pauses or recurrent A-fib to explain his symptoms.   Past Medical History:  Diagnosis Date   Achilles tendon rupture right   Arthritis    back and shoulders   Atrial flutter with rapid ventricular response ([place]) 07/22/2020   Chest pain    due to cocaine use   COVID-19 virus infection 05/31/2019   Diabetes  mellitus without complication ([place])    Drug abuse ([place])    cocaine, marijuana abuse   GERD (gastroesophageal reflux disease)    Hypertension    Renal insufficiency     Past Surgical History:  Procedure Laterality Date   ACHILLES TENDON SURGERY Right 05/02/2014   Procedure: RIGHT ACHILLES TENDON REPAIR;  Surgeon: Johnn Hai, MD;  Location: [facility];  Service: Orthopedics;  Laterality: Right;   BIOPSY  07/25/2020   Procedure: BIOPSY;  Surgeon: Otis Brace, MD;  Location: [facility];  Service:  Gastroenterology;;   CARDIAC CATHETERIZATION  05.31.2012   showing patent coronaries with no visualized vasospasm and EF of 55-60 %   ESOPHAGOGASTRODUODENOSCOPY N/A 07/25/2020   Procedure: ESOPHAGOGASTRODUODENOSCOPY (EGD);  Surgeon: Otis Brace, MD;  Location: [facility] END[facility]COPY;  Service: Gastroenterology;  Laterality: N/A;   ORBITAL FRACTURE SURGERY Left 2-3 yrs ago     Home Medications:  Prior to Admission medications   Medication Sig Start Date End Date Taking? Authorizing Provider  carvedilol (COREG) 3.125 MG tablet Take 1 tablet (3.125 mg total) by mouth 2 (two) times daily. 01/06/22  Yes Fenton, Clint R, PA  diltiazem (CARDIZEM CD) 240 MG 24 hr capsule Take 1 capsule (240 mg total) by mouth daily. 01/06/22  Yes Fenton, Clint R, PA  glipiZIDE (GLUCOTROL) 10 MG tablet Take 10 mg by mouth daily. 03/04/21  Yes [provider]  insulin NPH-regular Human (NOVOLIN 70/30) (70-30) 100 UNIT/ML injection Inject 35 units into the skin 2 times daily with a meal. Patient taking differently: Inject 45 Units into the skin 2 (two) times daily with a meal. 07/25/20  Yes Sarajane Jews, Callie E, PA-C  losartan (COZAAR) 50 MG tablet Take 1 tablet (50 mg total) by mouth daily. 08/11/20  Yes Sande Rives E, PA-C  omeprazole (PRILOSEC) 40 MG capsule Take 40 mg by mouth daily. 01/07/21  Yes [provider]  Potassium Chloride ER 20 MEQ TBCR Take 1 tablet by mouth daily. 10/15/21  Yes [provider]  ZYRTEC ALLERGY 10 MG tablet Take 10 mg by mouth in the morning. 03/03/20  Yes [provider]  ANTI-FUNGAL 1 % cream Apply 1 application topically daily as needed (anti fungus). Patient not taking: Reported on 02/09/2023 07/23/21   [provider]  apixaban (ELIQUIS) 5 MG TABS tablet Take 1 tablet (5 mg total) by mouth 2 (two) times daily. Patient not taking: Reported on 02/09/2023 02/03/22   Lorin Glass, PA-C  benzonatate (TESSALON) 100 MG capsule Take 1 capsule (100 mg  total) by mouth 3 (three) times daily as needed for cough. Patient not taking: Reported on 02/09/2023 11/11/21   Gareth Morgan, MD  diclofenac Sodium (VOLTAREN) 1 % GEL Apply 2 g topically 4 (four) times daily. Patient not taking: Reported on 02/09/2023 05/06/22   Long, Wonda Olds, MD  furosemide (LASIX) 80 MG tablet Take 1 tablet (80 mg total) by mouth daily for 7 days. Patient not taking: Reported on 02/09/2023 02/02/23 02/09/23  Rafoth, Dorian Pod, MD  HYDROcodone-acetaminophen (NORCO/VICODIN) 5-325 MG tablet Take 1 tablet by mouth every 4 (four) hours as needed. Patient not taking: Reported on 02/09/2023 07/27/22   Isla Pence, MD  methocarbamol (ROBAXIN) 500 MG tablet Take 1 tablet (500 mg total) by mouth 2 (two) times daily. Patient not taking: Reported on 02/09/2023 07/27/22   Isla Pence, MD  ondansetron (ZOFRAN) 4 MG tablet Take 1 tablet (4 mg total) by mouth every 8 (eight) hours as needed for nausea or vomiting. Patient not taking:  Reported on 02/09/2023 11/11/21   Gareth Morgan, MD  pantoprazole (PROTONIX) 40 MG tablet Take 1 tablet (40 mg total) by mouth 2 (two) times daily. Patient not taking: Reported on 02/09/2023 07/25/20   Sande Rives E, PA-C  sucralfate (CARAFATE) 1 g tablet Take 1 tablet (1 g total) by mouth 2 (two) times daily. Patient not taking: Reported on 02/09/2023 07/25/20 02/03/22  Sande Rives E, PA-C  triamcinolone cream (KENALOG) 0.1 % Apply 1 application topically 2 (two) times daily as needed (rash). Patient not taking: Reported on 02/09/2023 10/16/21   [provider]    Inpatient Medications: Scheduled Meds:  Continuous Infusions:  PRN Meds:   Allergies:   No Known Allergies  Social History:   Social History   Socioeconomic History   Marital status: Single    Spouse name: Not on file   Number of children: Not on file   Years of education: Not on file   Highest education level: Not on file  Occupational History   Not on file   Tobacco Use   Smoking status: Never   Smokeless tobacco: Never  Vaping Use   Vaping Use: Never used  Substance and Sexual Activity   Alcohol use: Yes    Alcohol/week: 2.0 standard drinks of alcohol    Types: 2 Shots of liquor per week    Comment: 2 shots once week 01/06/22   Drug use: Not Currently    Types: Cocaine, Marijuana    Comment: last cocaine use 1 year ago, last marijuana use 4 to 5 years ago 01/06/22   Sexual activity: Not on file  Other Topics Concern   Not on file  Social History Narrative   ** Merged History Encounter **       Pt currently lives with his brother, and his sister in [place]. He is employed by [employer]. He denies tobacco use. He does endorse occasional alcohol use. He does endorse cocaine use.  Family history : Non contributory   Social Determinants of Radio broadcast assistant Strain: Not on file  Food Insecurity: Not on file  Transportation Needs: Not on file  Physical Activity: Not on file  Stress: Not on file  Social Connections: Not on file  Intimate Partner Violence: Not on file    Family History:    Family History  Problem Relation Age of Onset   Unexplained death Mother 54       She died in 03-22-16, he does not remember what she died of   Unexplained death Father        He has no information on his father     ROS:  Please see the history of present illness.   All other ROS reviewed and negative.     Physical Exam/Data:   Vitals:   02/09/23 1315 02/09/23 1330 02/09/23 1400 02/09/23 1415  BP: (!) 147/89 139/83 (!) 142/91 (!) 144/93  Pulse: 81 78 79 78  Resp: '19 18 20 16  '$ Temp:      TempSrc:      SpO2: 96% 93% 95% 95%  Weight:      Height:       No intake or output data in the 24 hours ending 02/09/23 1546    02/09/2023   10:01 AM 07/26/2022    3:20 PM 06/23/2022   11:55 AM  Last 3 Weights  Weight (lbs) 242 lb 243 lb 242 lb  Weight (kg) 109.77 kg 110.224 kg 109.77 kg     Body  mass index is 35.74 kg/m.   General:  Well nourished, well developed, in no acute distress HEENT: normal Neck: no JVD Vascular: No carotid bruits; Distal pulses 2+ bilaterally Cardiac:  normal S1, S2; RRR; no murmur  Lungs:  clear to auscultation bilaterally, no wheezing, rhonchi or rales  Abd: soft, nontender, no hepatomegaly  Ext: 2+ ankle edema Musculoskeletal:  No deformities, BUE and BLE strength normal and equal Skin: warm and dry  Neuro:  CNs 2-12 intact, no focal abnormalities noted Psych:  Normal affect   EKG:  The EKG was personally reviewed and demonstrates: Normal sinus rhythm, 1-2+ pitting edema in bilateral ankle area Telemetry:  Telemetry was personally reviewed and demonstrates: Normal sinus rhythm, no significant bradycardia or tachycardia.  Relevant CV Studies:  Echo 07/22/2020  1. Left ventricular ejection fraction, by estimation, is 65 to 70%. The  left ventricle has hyperdynamic function. The left ventricle has no  regional wall motion abnormalities. There is mild concentric left  ventricular hypertrophy. Left ventricular  diastolic function could not be evaluated.   2. Right ventricular systolic function is normal. The right ventricular  size is normal.   3. Left atrial size was mildly dilated.   4. The mitral valve is normal in structure. No evidence of mitral valve  regurgitation.   5. The aortic valve is tricuspid. Aortic valve regurgitation is not  visualized. Mild aortic valve sclerosis is present, with no evidence of  aortic valve stenosis.   Laboratory Data:  High Sensitivity Troponin:   Recent Labs  Lab 02/09/23 0956 02/09/23 1100  TROPONINIHS 14 13     Chemistry Recent Labs  Lab 02/09/23 0956 02/09/23 1100  NA 138  --   K 3.2*  --   CL 105  --   CO2 21*  --   GLUCOSE 193*  --   BUN 13  --   CREATININE 1.29*  --   CALCIUM 8.7*  --   MG  --  2.0  GFRNONAA >60  --   ANIONGAP 12  --     Recent Labs  Lab 02/09/23 0956  PROT 7.3  ALBUMIN 3.4*  AST 19   ALT 15  ALKPHOS 78  BILITOT 0.6   Lipids No results for input(s): "CHOL", "TRIG", "HDL", "LABVLDL", "LDLCALC", "CHOLHDL" in the last 168 hours.  Hematology Recent Labs  Lab 02/09/23 0956  WBC 4.7  RBC 4.69  HGB 13.1  HCT 39.3  MCV 83.8  MCH 27.9  MCHC 33.3  RDW 14.7  PLT 213   Thyroid No results for input(s): "TSH", "FREET4" in the last 168 hours.  BNP Recent Labs  Lab 02/09/23 0956  BNP 95.1    DDimer No results for input(s): "DDIMER" in the last 168 hours.   Radiology/Studies:  CT Angio Chest PE W/Cm &/Or Wo Cm  Result Date: 02/09/2023 CLINICAL DATA:  Pulmonary embolism (PE) suspected, high prob EXAM: CT ANGIOGRAPHY CHEST WITH CONTRAST TECHNIQUE: Multidetector CT imaging of the chest was performed using the standard protocol during bolus administration of intravenous contrast. Multiplanar CT image reconstructions and MIPs were obtained to evaluate the vascular anatomy. RADIATION DOSE REDUCTION: This exam was performed according to the departmental dose-optimization program which includes automated exposure control, adjustment of the mA and/or kV according to patient size and/or use of iterative reconstruction technique. CONTRAST:  63m OMNIPAQUE IOHEXOL 350 MG/ML SOLN COMPARISON:  CTA of the chest 07/26/2021. FINDINGS: Cardiovascular: No evidence of pulmonary embolism to the level of the segmental pulmonary arteries. Cardiomegaly.  No pericardial effusion. Mediastinum/Nodes: Hiatal hernia with fluid in the lower esophagus. No enlarged mediastinal lymph nodes. Unremarkable appearance of the thyroid. Lungs/Pleura: Mild deep pendant linear opacities, compatible with atelectasis. No confluent consolidation. No pleural effusions or pneumothorax. 5 mm pulmonary nodule in the right upper lobe, similar to the prior. Upper Abdomen: No acute abnormality.  Hiatal hernia described above. Musculoskeletal: No acute findings. Multilevel degenerative change of the thoracic spine with bridging  osteophytes. Review of the MIP images confirms the above findings. IMPRESSION: 1. No evidence of acute pulmonary embolism. 2. Mild dependent atelectasis.  No consolidation. 3. Hiatal hernia with fluid in the lower esophagus, likely reflux. This places the patient at risk for aspiration. Electronically Signed   By: Margaretha Sheffield M.D.   On: 02/09/2023 12:53   DG Chest Port 1 View  Result Date: 02/09/2023 CLINICAL DATA:  Chest pain EXAM: PORTABLE CHEST 1 VIEW COMPARISON:  06/23/2022 FINDINGS: Numerous leads and wires project over the chest. Midline trachea. Mild cardiomegaly. Small hiatal hernia. Low lung volumes with resultant pulmonary interstitial prominence. IMPRESSION: Cardiomegaly and low lung volumes, without acute disease. Hiatal hernia. Electronically Signed   By: Abigail Miyamoto M.D.   On: 02/09/2023 10:29     Assessment and Plan:   Chest pain: Right-sided chest pain radiating to the left side.  Chest pain started around 7 AM this morning, waxed and waned, eased off but did not completely go away.  Seen by internal medicine service felt reproducible with palpation.  He denies any exacerbation with palpation, deep inspiration or body rotation with me.    - seen by Dr. Claiborne Billings, felt to be musculoskeletal pain. Given prolonged chest pain with negative enzyme, no further work up  Syncope: Blood sugar normal.  No significant hypotension on arrival.  No orthostatic vital signs obtained so far.  No prodromal symptoms other than dizziness, there was no flushing sensation, shortness of breath, flushing sensation or diaphoresis.  Patient had did have dizziness prior to the event and was sitting down when he passed out.  He did have a remote passing out spell several years ago, however no recent syncope.  Urine drug test. Will follow up as outpatient and reassess, hold off heart monitor  History of atrial flutter: Per patient, he has not been taking Eliquis due to cost.  He is able to get all other  medication from the health department for free.  He has been compliant on all blood pressure medication.   Risk Assessment/Risk Scores:          CHA2DS2-VASc Score = 2   This indicates a 2.2% annual risk of stroke. The patient's score is based upon: CHF History: 0 HTN History: 1 Diabetes History: 1 Stroke History: 0 Vascular Disease History: 0 Age Score: 0 Gender Score: 0         For questions or updates, please contact [place] Please consult [email or web address] for contact info under    Hilbert Corrigan, [place]  02/09/2023 3:46 PM   Patient seen and examined. Agree with assessment and plan.  Ryan [name] is a 62 year old African-American male with prior history of polysubstance abuse, with history of atrial flutter, hypertension, diabetes mellitus, and CKD.  He has been maintaining sinus rhythm.  He has had issues with lower extremity edema right leg greater than left for which he has been taking Lasix 80 mg daily.  He presented to the hospital today after developing some right-sided superficial chest pain which radiated to his left side.  It was unassociated with activity.  The patient had not taken his blood pressure medications this morning.  He did experience some lightheadedness and while sitting down may have blacked out for seconds with prompt resolution.  In the emergency room, he appears comfortable.  Laboratory revealed low potassium at 3.2 with creatinine improved from previously now at 1.29 and normal BMP.  Troponins are normal.  Glucose was increased at 193.  Chest x-ray demonstrated low lung volume with suggestion of cardiomegaly without acute findings.  CT angio did not reveal any evidence for acute pulmonary embolism.  There was mild dependent atelectasis.  He has a hiatal hernia with probable reflux.  His ECG is unchanged from previous ECGs which shows nondiagnostic inferolateral T changes.  Presently, blood pressure is elevated but he states he did not take his  medical regimen of Cardizem, losartan and carvedilol this morning.  Rhythm is regular without ectopy.  HEENT is unremarkable.  He has thick neck.  There is no JVD.  Lungs are clear.  He has definite chest wall tenderness over the right and left costochondral region mimicking his previous discomfort.  Rhythm is regular without ectopy.  He has central adiposity.  Abdomen is nontender.  He has 1+ right lower extremity edema; trace on the left and grossly normal neurologic evaluation.  My impression is that Ryan [name] chest pain most likely is musculoskeletal in etiology manifested by his tenderness over the costochondral region.  He states he is no longer use drugs.  He is not on anticoagulation and has been maintaining sinus rhythm.  From a cardiac standpoint I believe he is stable for discharge from the ER.  He has been seen in the past by Dr. Debara Pickett and would recommend follow-up Cardiologic evaluation.  I discussed obtaining support compression stockings to aid with his right lower extremity edema.  We discussed sodium restriction.  If he develops recurrent symptoms of dizziness or palpitations event monitor can be obtained as an outpatient.   Troy Sine, MD, [facility] 02/09/2023 4:51 PM

## 2023-02-09 NOTE — Hospital Course (Addendum)
Syncope Chest pain   Started having chest pain this morning at work 8/10 All over his chest No exertion Chest pain does not radiate anywhere else (to jaw or arm) Never happened before  Lightheaded Passed out

## 2023-02-09 NOTE — ED Provider Notes (Signed)
[facility] Provider Note   CSN: [id number] Arrival date & time: 02/09/23  [id number]     History  Chief Complaint  Patient presents with   Chest Pain    Khareem Klomp Criner is a 62 y.o. male.   Chest Pain   62 year old male presents emergency department with complaints of chest pain.  Patient states that chest pain began while at work earlier today at [employer].  Patient states that chest pain began on right side initially and now radiates to left side of chest.   States that he was walking experiencing the chest pain when he began to feel dizzy as if he was going to pass out.  He subsequently sat down and reportedly lost consciousness for " at least a matter of seconds" before regaining consciousness.  Denies fever, chills, cough, congestion, shortness of breath, abdominal pain, nausea, vomiting, urinary symptoms, change in bowel habits.  He secondarily endorses bilateral lower extremity swelling that has worsened over the past several days with right greater than left.  States he was seen in urgent care and given diuretic of which has not helped much.  Denies any cocaine or other drug use recently.  States he had similar episode approximately 1 to 1-1/2 years ago but was never seen for it.  Denies family history of sudden death by cardiac cause.  Past medical history significant for atrial flutter with RVR on Eliquis, diabetes mellitus, drug abuse/cocaine use, GERD, CKD stage III  Home Medications Prior to Admission medications   Medication Sig Start Date End Date Taking? Authorizing Provider  carvedilol (COREG) 3.125 MG tablet Take 1 tablet (3.125 mg total) by mouth 2 (two) times daily. 01/06/22  Yes Fenton, Clint R, PA  diltiazem (CARDIZEM CD) 240 MG 24 hr capsule Take 1 capsule (240 mg total) by mouth daily. 01/06/22  Yes Fenton, Clint R, PA  glipiZIDE (GLUCOTROL) 10 MG tablet Take 10 mg by mouth daily. 03/04/21  Yes [provider]   insulin NPH-regular Human (NOVOLIN 70/30) (70-30) 100 UNIT/ML injection Inject 35 units into the skin 2 times daily with a meal. Patient taking differently: Inject 45 Units into the skin 2 (two) times daily with a meal. 07/25/20  Yes Sarajane Jews, Callie E, PA-C  losartan (COZAAR) 50 MG tablet Take 1 tablet (50 mg total) by mouth daily. 08/11/20  Yes Sande Rives E, PA-C  omeprazole (PRILOSEC) 40 MG capsule Take 40 mg by mouth daily. 01/07/21  Yes [provider]  Potassium Chloride ER 20 MEQ TBCR Take 1 tablet by mouth daily. 10/15/21  Yes [provider]  ZYRTEC ALLERGY 10 MG tablet Take 10 mg by mouth in the morning. 03/03/20  Yes [provider]  ANTI-FUNGAL 1 % cream Apply 1 application topically daily as needed (anti fungus). Patient not taking: Reported on 02/09/2023 07/23/21   [provider]  apixaban (ELIQUIS) 5 MG TABS tablet Take 1 tablet (5 mg total) by mouth 2 (two) times daily. Patient not taking: Reported on 02/09/2023 02/03/22   Lorin Glass, PA-C  benzonatate (TESSALON) 100 MG capsule Take 1 capsule (100 mg total) by mouth 3 (three) times daily as needed for cough. Patient not taking: Reported on 02/09/2023 11/11/21   Gareth Morgan, MD  diclofenac Sodium (VOLTAREN) 1 % GEL Apply 2 g topically 4 (four) times daily. Patient not taking: Reported on 02/09/2023 05/06/22   Margette Fast, MD  furosemide (LASIX) 80 MG tablet Take 1 tablet (80 mg  total) by mouth daily for 7 days. Patient not taking: Reported on 02/09/2023 02/02/23 02/09/23  Rafoth, Dorian Pod, MD  HYDROcodone-acetaminophen (NORCO/VICODIN) 5-325 MG tablet Take 1 tablet by mouth every 4 (four) hours as needed. Patient not taking: Reported on 02/09/2023 07/27/22   Isla Pence, MD  methocarbamol (ROBAXIN) 500 MG tablet Take 1 tablet (500 mg total) by mouth 2 (two) times daily. Patient not taking: Reported on 02/09/2023 07/27/22   Isla Pence, MD  ondansetron (ZOFRAN) 4 MG tablet Take 1  tablet (4 mg total) by mouth every 8 (eight) hours as needed for nausea or vomiting. Patient not taking: Reported on 02/09/2023 11/11/21   Gareth Morgan, MD  pantoprazole (PROTONIX) 40 MG tablet Take 1 tablet (40 mg total) by mouth 2 (two) times daily. Patient not taking: Reported on 02/09/2023 07/25/20   Sande Rives E, PA-C  sucralfate (CARAFATE) 1 g tablet Take 1 tablet (1 g total) by mouth 2 (two) times daily. Patient not taking: Reported on 02/09/2023 07/25/20 02/03/22  Sande Rives E, PA-C  triamcinolone cream (KENALOG) 0.1 % Apply 1 application topically 2 (two) times daily as needed (rash). Patient not taking: Reported on 02/09/2023 10/16/21   [provider]      Allergies    Patient has no known allergies.    Review of Systems   Review of Systems  Cardiovascular:  Positive for chest pain.  All other systems reviewed and are negative.   Physical Exam Updated Vital Signs BP (!) 151/92   Pulse 85   Temp 98.1 F (36.7 C) (Oral)   Resp 18   Ht '5\' 9"'$  (1.753 m)   Wt 109.8 kg   SpO2 99%   BMI 35.74 kg/m  Physical Exam Vitals and nursing note reviewed.  Constitutional:      General: He is not in acute distress.    Appearance: He is well-developed.  HENT:     Head: Normocephalic and atraumatic.  Eyes:     Conjunctiva/sclera: Conjunctivae normal.  Cardiovascular:     Rate and Rhythm: Normal rate and regular rhythm.     Heart sounds: No murmur heard. Pulmonary:     Effort: Pulmonary effort is normal. No respiratory distress.     Breath sounds: Normal breath sounds.  Abdominal:     Palpations: Abdomen is soft.     Tenderness: There is no abdominal tenderness.  Musculoskeletal:        General: No swelling.     Cervical back: Neck supple.     Right lower leg: Edema present.     Left lower leg: Edema present.     Comments: 2-3+ bilateral lower extremity edema appreciated.  Skin:    General: Skin is warm and dry.     Capillary Refill: Capillary refill  takes less than 2 seconds.  Neurological:     Mental Status: He is alert.     Comments: Alert and oriented to self, place, time and event.   Speech is fluent, clear without dysarthria or dysphasia.   Strength 5/5 in upper/lower extremities   Sensation intact in upper/lower extremities   Normal gait.  Negative Romberg. No pronator drift.  Normal finger-to-nose and feet tapping.  CN I not tested  CN II not tested CN III, IV, VI PERRLA and EOMs intact bilaterally  CN V Intact sensation to sharp and light touch to the face  CN VII facial movements symmetric  CN VIII not tested  CN IX, X no uvula deviation, symmetric rise of soft  palate  CN XI 5/5 SCM and trapezius strength bilaterally  CN XII Midline tongue protrusion, symmetric L/R movements   Psychiatric:        Mood and Affect: Mood normal.     ED Results / Procedures / Treatments   Labs (all labs ordered are listed, but only abnormal results are displayed) Labs Reviewed  COMPREHENSIVE METABOLIC PANEL - Abnormal; Notable for the following components:      Result Value   Potassium 3.2 (*)    CO2 21 (*)    Glucose, Bld 193 (*)    Creatinine, Ser 1.29 (*)    Calcium 8.7 (*)    Albumin 3.4 (*)    All other components within normal limits  CBC WITH DIFFERENTIAL/PLATELET - Abnormal; Notable for the following components:   Neutro Abs 1.5 (*)    All other components within normal limits  BRAIN NATRIURETIC PEPTIDE  MAGNESIUM  RAPID URINE DRUG SCREEN, HOSP PERFORMED  TROPONIN I (HIGH SENSITIVITY)  TROPONIN I (HIGH SENSITIVITY)    EKG EKG Interpretation  Date/Time:  Wednesday February 09 2023 09:55:57 EST Ventricular Rate:  85 PR Interval:  181 QRS Duration: 84 QT Interval:  369 QTC Calculation: 439 R Axis:   36 Text Interpretation: Sinus rhythm Anterior infarct, old Borderline repolarization abnormality No significant change since last tracing Confirmed by Leanord Asal ([id number]) on 02/09/2023 9:58:04  AM  Radiology CT Angio Chest PE W/Cm &/Or Wo Cm  Result Date: 02/09/2023 CLINICAL DATA:  Pulmonary embolism (PE) suspected, high prob EXAM: CT ANGIOGRAPHY CHEST WITH CONTRAST TECHNIQUE: Multidetector CT imaging of the chest was performed using the standard protocol during bolus administration of intravenous contrast. Multiplanar CT image reconstructions and MIPs were obtained to evaluate the vascular anatomy. RADIATION DOSE REDUCTION: This exam was performed according to the departmental dose-optimization program which includes automated exposure control, adjustment of the mA and/or kV according to patient size and/or use of iterative reconstruction technique. CONTRAST:  72m OMNIPAQUE IOHEXOL 350 MG/ML SOLN COMPARISON:  CTA of the chest 07/26/2021. FINDINGS: Cardiovascular: No evidence of pulmonary embolism to the level of the segmental pulmonary arteries. Cardiomegaly. No pericardial effusion. Mediastinum/Nodes: Hiatal hernia with fluid in the lower esophagus. No enlarged mediastinal lymph nodes. Unremarkable appearance of the thyroid. Lungs/Pleura: Mild deep pendant linear opacities, compatible with atelectasis. No confluent consolidation. No pleural effusions or pneumothorax. 5 mm pulmonary nodule in the right upper lobe, similar to the prior. Upper Abdomen: No acute abnormality.  Hiatal hernia described above. Musculoskeletal: No acute findings. Multilevel degenerative change of the thoracic spine with bridging osteophytes. Review of the MIP images confirms the above findings. IMPRESSION: 1. No evidence of acute pulmonary embolism. 2. Mild dependent atelectasis.  No consolidation. 3. Hiatal hernia with fluid in the lower esophagus, likely reflux. This places the patient at risk for aspiration. Electronically Signed   By: FMargaretha SheffieldM.D.   On: 02/09/2023 12:53   DG Chest Port 1 View  Result Date: 02/09/2023 CLINICAL DATA:  Chest pain EXAM: PORTABLE CHEST 1 VIEW COMPARISON:  06/23/2022 FINDINGS:  Numerous leads and wires project over the chest. Midline trachea. Mild cardiomegaly. Small hiatal hernia. Low lung volumes with resultant pulmonary interstitial prominence. IMPRESSION: Cardiomegaly and low lung volumes, without acute disease. Hiatal hernia. Electronically Signed   By: KAbigail MiyamotoM.D.   On: 02/09/2023 10:29    Procedures Procedures    Medications Ordered in ED Medications  fentaNYL (SUBLIMAZE) injection 25 mcg (25 mcg Intravenous Given 02/09/23 1054)  potassium chloride SA (KLOR-CON  M) CR tablet 40 mEq (40 mEq Oral Given 02/09/23 1301)  ketorolac (TORADOL) 30 MG/ML injection 15 mg (15 mg Intravenous Given 02/09/23 1302)  prochlorperazine (COMPAZINE) injection 10 mg (10 mg Intravenous Given 02/09/23 1303)  diphenhydrAMINE (BENADRYL) injection 25 mg (25 mg Intravenous Given 02/09/23 1301)  iohexol (OMNIPAQUE) 350 MG/ML injection 75 mL (75 mLs Intravenous Contrast Given 02/09/23 1245)    ED Course/ Medical Decision Making/ A&P Clinical Course as of 02/09/23 1749  Wed Feb 09, 2023  1325 Patient had resolution of headache that began while emergency department with administration of migraine cocktail.  Chest pain has improved some with medication.  Discussed with patient regarding admission given chest pain concurrent with syncope.  He was agreeable.  Will pursue admission. [CR]  1335 Consulted hospitalist Dr. Allyson Sabal who agreed with admission and assume further treatment/care. [CR]  [facility] regarding the patient.  Agreed to see patient once admitted through hospitalist. [CR]  1607 Conversation was had again with hospitalist service who felt like patient was most fit for outpatient management given lack of concern of connection between patient's chest pain and syncopal episode.  Syncopal episode deemed most likely vasovagal chest pain seems more likely musculoskeletal.  Expectant discharge. [CR]    Clinical Course User Index [CR] Wilnette Kales, PA                              Medical Decision Making Amount and/or Complexity of Data Reviewed Labs: ordered. Radiology: ordered.  Risk Prescription drug management.   This patient presents to the ED for concern of syncope, this involves an extensive number of treatment options, and is a complaint that carries with it a high risk of complications and morbidity.  The differential diagnosis includes Vasovagal, prolonged QT, Wolff-Parkinson-White, Brugada syndrome, hypertrophic cardiomyopathy, aortic stenosis, pulmonary embolism, thoracic aortic dissection, sepsis, GI bleed, ruptured AAA, subarachnoid hemorrhage, CVA.  Co morbidities that complicate the patient evaluation  See HPI   Additional history obtained:  Additional history obtained from EMR External records from outside source obtained and reviewed including hospital records   Lab Tests:  I Ordered, and personally interpreted labs.  The pertinent results include: No leukocytosis noted.  No evidence of anemia.  Platelets within normal range.  Mild hypokalemia supplemented orally while in the emergency department at 3.2.  Decreased bicarb of 21.  Also mild hypocalcemia of 8.7.  No transaminitis.  No renal dysfunction.  BNP within normal limits.  Initial troponin of 14 with repeat 13; EKG without acute ischemic changes so doubt ACS.  Mag within normal limits.   Imaging Studies ordered:  I ordered imaging studies including chest x-ray, CT angio chest PE I independently visualized and interpreted imaging which showed  Chest x-ray: Cardiomegaly with low lung volumes.  No acute abnormalities.  Hiatal hernia appreciated. CT angio chest PE: No evidence of acute PE.  Mild dependent atelectasis with no consolidations.  Hiatal hernia with fluid in lower esophagus. I agree with the radiologist interpretation   Cardiac Monitoring: / EKG:  The patient was maintained on a cardiac monitor.  I personally viewed and interpreted the cardiac monitored which  showed an underlying rhythm of: Sinus rhythm with borderline repolarization changes; no acute changes from prior EKGs performed.   Consultations Obtained:  See ED course   Problem List / ED Course / Critical interventions / Medication management  Syncope/chest pain I ordered medication including Compazine, Toradol, Benadryl for migraine, fentanyl  for pain   Reevaluation of the patient after these medicines showed that the patient improved I have reviewed the patients home medicines and have made adjustments as needed   Social Determinants of Health:  Cocaine use.  Denies tobacco use.   Test / Admission - Considered:  Syncope/chest pain Vitals signs significant for hypertension with blood pressure of 136/91.  Recommend follow-up with primary care regarding ablation blood pressure. Otherwise within normal range and stable throughout visit. Laboratory/imaging studies significant for: See above Doubt ACS given delta negative troponin, lack of EKG changes.  Patient has heart score of 0-3.  Doubt PE given negative CT PE study.  Negative.  Doubt pneumonia, aortic dissection, pneumothorax.  Patient's episode of syncope with prodrome of feeling warm/dizzy and happened with physical exertion.  Discussion was had with hospital medicine regarding admission the patient given concurrent chest pain despite negative workup with syncopal episode.  Hospital medicine service independently evaluated the patient and preferred patient to be managed outpatient given chest pain most likely musculoskeletal in nature with syncopal episode being most likely vasovagal.  They discussed prompt outpatient follow-up regarding patient's symptoms for further optical medical management.  This deemed reasonable.  Discussed case with attending physician Dr. Vanita Panda who is in agreement with treatment plan going forward.  Treatment plan discussed at length with patient and he acknowledged understanding was also agreeable with  treatment plan going forward. Worrisome signs and symptoms were discussed with the patient, and the patient acknowledged understanding to return to the ED if noticed. Patient was stable upon discharge.         Final Clinical Impression(s) / ED Diagnoses Final diagnoses:  Chest pain, unspecified type  Syncope, unspecified syncope type    Rx / DC Orders ED Discharge Orders     None         Wilnette Kales, [place] 02/09/23 1749    Carmin Muskrat, MD 02/10/23 [phone]

## 2023-02-09 NOTE — Discharge Instructions (Signed)
As discussed, recommend follow-up with internal medicine specialist.  See information they provided for follow-up.  Recommend taking Tylenol/Motrin as needed for pain/discomfort.  Recommend getting up slowly from seated position as well as maintaining adequate oral hydration.  Please do not hesitate to return to emergency department for worrisome signs and symptoms we discussed become apparent.

## 2023-02-09 NOTE — Consult Note (Signed)
Date: 02/09/2023               Patient Name:  Ryan [name] MRN: [id number]  DOB: 1961/09/21 Age / Sex: 62 y.o., male   PCP: Synthia Innocent Audrea Muscat, NP         Requesting Physician: Dr. Carmin Muskrat, MD    Consulting Reason:  R sided chest pain, pre-syncope/syncope     Chief Complaint: R sided chest pain  History of Present Illness:   Ryan Gandee. [name] is a 62 year old male living with HTN, insulin-dependent T2DM, CKD III, atrial flutter on eliquis who presents with right sided chest pain that began earlier today and an episode of presyncope/syncope that occurred once earlier today.   He notes that he woke up in his usual state of health and went to work at [employer]. 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 Shortly thereafter, he began experiencing right sided chest pain. His pain did not radiate anywhere, was not associated with exertion or shortness of breath. He denies any nausea, vomiting, diaphoresis, SHOB, abdominal pain during this time. Around this time, he also began to feel lightheaded and subsequently sat down in a chair. He continued to feel lightheaded and then felt like he was about to pass out. Unsure if he actually passed out but thinks that he may have. He was able to open his eyes again a minute or two after and felt okay, although continued to have this right sided chest pain. He denies any urinary incontinence, shaking spells, tongue biting, palpitations, N/V during this episode. He does endorse feeling warm along with the lightheadedness. He does note that over the course of the day, his right sided chest pain slowly began spreading to the left side of his chest, but did not radiate anywhere else. Denies any recent illness or medication changes. These constellation of symptoms prompted him to come for further evaluation.  Of note, eliquis has been somewhat cost-prohibitive for him and his adherence with it is unclear.  ED course: CBC unremarkable, hemoglobin at  baseline and no leukocytosis. CMP with hypokalemia (repleted by EDP), BG 193, Cr 1.29 (at baseline), and otherwise stable findings. Mg 2.0. BNP 95.1. HS trops 14 > 13. EKG with NSR and no acute ischemic changes, similar to prior. CXR was unremarkable for acute disease. CTA PE with no evidence of PE and mild dependent atelectasis. IMTS asked to see patient for R sided chest pain and possible syncopal episode.   Meds: No current facility-administered medications for this encounter.   Current Outpatient Medications  Medication Sig Dispense Refill   carvedilol (COREG) 3.125 MG tablet Take 1 tablet (3.125 mg total) by mouth 2 (two) times daily. 60 tablet 6   diltiazem (CARDIZEM CD) 240 MG 24 hr capsule Take 1 capsule (240 mg total) by mouth daily. 30 capsule 6   glipiZIDE (GLUCOTROL) 10 MG tablet Take 10 mg by mouth daily.     insulin NPH-regular Human (NOVOLIN 70/30) (70-30) 100 UNIT/ML injection Inject 35 units into the skin 2 times daily with a meal. (Patient taking differently: Inject 45 Units into the skin 2 (two) times daily with a meal.) 10 mL 1   losartan (COZAAR) 50 MG tablet Take 1 tablet (50 mg total) by mouth daily. 90 tablet 3   omeprazole (PRILOSEC) 40 MG capsule Take 40 mg by mouth daily.     Potassium Chloride ER 20 MEQ TBCR Take 1 tablet by mouth daily.     ZYRTEC ALLERGY 10 MG  tablet Take 10 mg by mouth in the morning.     ANTI-FUNGAL 1 % cream Apply 1 application topically daily as needed (anti fungus). (Patient not taking: Reported on 02/09/2023)     apixaban (ELIQUIS) 5 MG TABS tablet Take 1 tablet (5 mg total) by mouth 2 (two) times daily. (Patient not taking: Reported on 02/09/2023) 60 tablet 0   benzonatate (TESSALON) 100 MG capsule Take 1 capsule (100 mg total) by mouth 3 (three) times daily as needed for cough. (Patient not taking: Reported on 02/09/2023) 21 capsule 0   diclofenac Sodium (VOLTAREN) 1 % GEL Apply 2 g topically 4 (four) times daily. (Patient not taking: Reported on  02/09/2023) 100 g 0   furosemide (LASIX) 80 MG tablet Take 1 tablet (80 mg total) by mouth daily for 7 days. (Patient not taking: Reported on 02/09/2023) 7 tablet 0   HYDROcodone-acetaminophen (NORCO/VICODIN) 5-325 MG tablet Take 1 tablet by mouth every 4 (four) hours as needed. (Patient not taking: Reported on 02/09/2023) 10 tablet 0   methocarbamol (ROBAXIN) 500 MG tablet Take 1 tablet (500 mg total) by mouth 2 (two) times daily. (Patient not taking: Reported on 02/09/2023) 20 tablet 0   ondansetron (ZOFRAN) 4 MG tablet Take 1 tablet (4 mg total) by mouth every 8 (eight) hours as needed for nausea or vomiting. (Patient not taking: Reported on 02/09/2023) 12 tablet 0   pantoprazole (PROTONIX) 40 MG tablet Take 1 tablet (40 mg total) by mouth 2 (two) times daily. (Patient not taking: Reported on 02/09/2023) 60 tablet 2   sucralfate (CARAFATE) 1 g tablet Take 1 tablet (1 g total) by mouth 2 (two) times daily. (Patient not taking: Reported on 02/09/2023) 60 tablet 2   triamcinolone cream (KENALOG) 0.1 % Apply 1 application topically 2 (two) times daily as needed (rash). (Patient not taking: Reported on 02/09/2023)      Allergies: Allergies as of 02/09/2023   (No Known Allergies)   Past Medical History:  Diagnosis Date   Achilles tendon rupture right   Arthritis    back and shoulders   Atrial flutter with rapid ventricular response ([place]) 07/22/2020   Chest pain    due to cocaine use   COVID-19 virus infection 05/31/2019   Diabetes mellitus without complication ([place])    Drug abuse ([place])    cocaine, marijuana abuse   GERD (gastroesophageal reflux disease)    Hypertension    Renal insufficiency    Past Surgical History:  Procedure Laterality Date   ACHILLES TENDON SURGERY Right 05/02/2014   Procedure: RIGHT ACHILLES TENDON REPAIR;  Surgeon: Johnn Hai, MD;  Location: [facility];  Service: Orthopedics;  Laterality: Right;   BIOPSY  07/25/2020   Procedure: BIOPSY;  Surgeon: Otis Brace, MD;   Location: [facility];  Service: Gastroenterology;;   CARDIAC CATHETERIZATION  05.31.2012   showing patent coronaries with no visualized vasospasm and EF of 55-60 %   ESOPHAGOGASTRODUODENOSCOPY N/A 07/25/2020   Procedure: ESOPHAGOGASTRODUODENOSCOPY (EGD);  Surgeon: Otis Brace, MD;  Location: [facility];  Service: Gastroenterology;  Laterality: N/A;   ORBITAL FRACTURE SURGERY Left 2-3 yrs ago   Family History  Problem Relation Age of Onset   Unexplained death Mother 68       She died in 02/29/16, he does not remember what she died of   Unexplained death Father        He has no information on his father   Social History   Socioeconomic History   Marital status: Single  Spouse name: Not on file   Number of children: Not on file   Years of education: Not on file   Highest education level: Not on file  Occupational History   Not on file  Tobacco Use   Smoking status: Never   Smokeless tobacco: Never  Vaping Use   Vaping Use: Never used  Substance and Sexual Activity   Alcohol use: Yes    Alcohol/week: 2.0 standard drinks of alcohol    Types: 2 Shots of liquor per week    Comment: 2 shots once week 01/06/22   Drug use: Not Currently    Types: Cocaine, Marijuana    Comment: last cocaine use 1 year ago, last marijuana use 4 to 5 years ago 01/06/22   Sexual activity: Not on file  Other Topics Concern   Not on file  Social History Narrative   ** Merged History Encounter **       Pt currently lives with his brother, and his sister in [place]. He is employed by [employer]. He denies tobacco use. He does endorse occasional alcohol use. He does endorse cocaine use.  Family history : Non contributory   Social Determinants of Radio broadcast assistant Strain: Not on file  Food Insecurity: Not on file  Transportation Needs: Not on file  Physical Activity: Not on file  Stress: Not on file  Social Connections: Not on file  Intimate Partner Violence: Not on file     Review of Systems: Pertinent items noted in HPI and remainder of comprehensive ROS otherwise negative.  Physical Exam: Blood pressure (!) 144/93, pulse 78, temperature 98.1 F (36.7 C), temperature source Oral, resp. rate 16, height '5\' 9"'$  (1.753 m), weight 109.8 kg, SpO2 95 %. Physical Exam Constitutional:      General: He is not in acute distress.    Appearance: He is well-developed. He is obese. He is not ill-appearing or diaphoretic.  HENT:     Head: Normocephalic and atraumatic.  Cardiovascular:     Rate and Rhythm: Normal rate and regular rhythm.     Pulses:          Radial pulses are 2+ on the right side and 2+ on the left side.       Dorsalis pedis pulses are 2+ on the right side and 2+ on the left side.     Heart sounds: Normal heart sounds.     Comments: Unable to appreciate JVD given habitus. Pulmonary:     Effort: Pulmonary effort is normal. No respiratory distress.     Breath sounds: Normal breath sounds.  Chest:     Chest wall: Tenderness present.     Comments: Tender to palpation across chest, reproducible.  Abdominal:     General: Bowel sounds are normal.     Palpations: Abdomen is soft.     Tenderness: There is no abdominal tenderness. There is no guarding or rebound.  Musculoskeletal:     Right lower leg: Edema present.     Left lower leg: Edema present.  Skin:    General: Skin is warm and dry.  Neurological:     General: No focal deficit present.     Mental Status: He is alert and oriented to person, place, and time.     Motor: No weakness.  Psychiatric:        Mood and Affect: Mood normal.        Behavior: Behavior normal.      Lab results: CBC  Component Value Date/Time   WBC 4.7 02/09/2023 0956   RBC 4.69 02/09/2023 0956   HGB 13.1 02/09/2023 0956   HGB 13.8 06/01/2019 0638   HCT 39.3 02/09/2023 0956   PLT 213 02/09/2023 0956   MCV 83.8 02/09/2023 0956   MCH 27.9 02/09/2023 0956   MCHC 33.3 02/09/2023 0956   RDW 14.7 02/09/2023  0956   LYMPHSABS 2.5 02/09/2023 0956   MONOABS 0.6 02/09/2023 0956   EOSABS 0.1 02/09/2023 0956   BASOSABS 0.0 02/09/2023 0956   CMP     Component Value Date/Time   NA 138 02/09/2023 0956   K 3.2 (L) 02/09/2023 0956   CL 105 02/09/2023 0956   CO2 21 (L) 02/09/2023 0956   GLUCOSE 193 (H) 02/09/2023 0956   BUN 13 02/09/2023 0956   CREATININE 1.29 (H) 02/09/2023 0956   CALCIUM 8.7 (L) 02/09/2023 0956   PROT 7.3 02/09/2023 0956   ALBUMIN 3.4 (L) 02/09/2023 0956   AST 19 02/09/2023 0956   ALT 15 02/09/2023 0956   ALKPHOS 78 02/09/2023 0956   BILITOT 0.6 02/09/2023 0956   GFRNONAA >60 02/09/2023 0956   GFRAA >60 08/07/2020 2222   HS Troponin - 14 > 13 Mg 2.0 BNP 95.1   Imaging results:  CT Angio Chest PE W/Cm &/Or Wo Cm  Result Date: 02/09/2023 CLINICAL DATA:  Pulmonary embolism (PE) suspected, high prob EXAM: CT ANGIOGRAPHY CHEST WITH CONTRAST TECHNIQUE: Multidetector CT imaging of the chest was performed using the standard protocol during bolus administration of intravenous contrast. Multiplanar CT image reconstructions and MIPs were obtained to evaluate the vascular anatomy. RADIATION DOSE REDUCTION: This exam was performed according to the departmental dose-optimization program which includes automated exposure control, adjustment of the mA and/or kV according to patient size and/or use of iterative reconstruction technique. CONTRAST:  28m OMNIPAQUE IOHEXOL 350 MG/ML SOLN COMPARISON:  CTA of the chest 07/26/2021. FINDINGS: Cardiovascular: No evidence of pulmonary embolism to the level of the segmental pulmonary arteries. Cardiomegaly. No pericardial effusion. Mediastinum/Nodes: Hiatal hernia with fluid in the lower esophagus. No enlarged mediastinal lymph nodes. Unremarkable appearance of the thyroid. Lungs/Pleura: Mild deep pendant linear opacities, compatible with atelectasis. No confluent consolidation. No pleural effusions or pneumothorax. 5 mm pulmonary nodule in the right upper  lobe, similar to the prior. Upper Abdomen: No acute abnormality.  Hiatal hernia described above. Musculoskeletal: No acute findings. Multilevel degenerative change of the thoracic spine with bridging osteophytes. Review of the MIP images confirms the above findings. IMPRESSION: 1. No evidence of acute pulmonary embolism. 2. Mild dependent atelectasis.  No consolidation. 3. Hiatal hernia with fluid in the lower esophagus, likely reflux. This places the patient at risk for aspiration. Electronically Signed   By: FMargaretha SheffieldM.D.   On: 02/09/2023 12:53   DG Chest Port 1 View  Result Date: 02/09/2023 CLINICAL DATA:  Chest pain EXAM: PORTABLE CHEST 1 VIEW COMPARISON:  06/23/2022 FINDINGS: Numerous leads and wires project over the chest. Midline trachea. Mild cardiomegaly. Small hiatal hernia. Low lung volumes with resultant pulmonary interstitial prominence. IMPRESSION: Cardiomegaly and low lung volumes, without acute disease. Hiatal hernia. Electronically Signed   By: KAbigail MiyamotoM.D.   On: 02/09/2023 10:29    Other results: EKG: unchanged from previous tracings, normal sinus rhythm.  Assessment, Plan, & Recommendations by Problem: Active Problems:   Uncontrolled hypertension   Diabetes mellitus type 2, insulin dependent (HCC)   CKD (chronic kidney disease), stage III (HCC)   Atrial flutter ([place]   R  sided reproducible chest wall tenderness Troponins negative x2 and EKG with no acute ischemic changes. Low suspicion for ACS. Patient with likely MSK chest wall tenderness given tenderness to palpation and reproducibility. Cardiology evaluated patient and agree with evaluation. They do recommend outpatient cardiology follow up with prior cardiologist (Dr. Debara Pickett).  -supportive care -f/u with Dr. Debara Pickett as an outpatient -appreciate cardiology recommendations  Pre-syncope vs Syncope Lightheadedness Patient with possible presyncopal or syncopal episode, unclear based on history given and  unwitnessed. Nonetheless, appears that he was experiencing lightheadedness and possible warmth around time of this event. EKG is without any bundle branch blocks or acute ischemic changes, negative trops, and presence of prodromal symptoms so low concern for cardiac etiology. He denies any post-ictal state, urinary incontinence, tongue biting, shaking spell so low concern for seizure. No focal deficits on exam so low concern for stroke. BG 195 on arrival so low suspicion for hypoglycemia as cause. Given prodrome, likely etiology is orthostatic but vasovagal also possible. No orthostatic vitals checked in ED, but he was HDS and BP remained [id number] systolics. Encouraged adequate hydration and provided reassurance.  T2DM, insulin dependent On novolin 70/30 with 45u BID with meals and glipizide '10mg'$  daily. BG 195 on arrival. Reports that he does not frequently check sugar levels at home but when he does they typically remain in the upper 100s to low 200s. He would likely benefit from discontinuation of glipizide and initiation of possible SGLT-2i therapy and GLP-1a therapy for cardio, renal, and weight loss benefits. He will [facility] up at the [facility] for ED follow up and possibly to establish care with a new PCP. -continue current medications -close f/u at [facility] early next week  HTN SBPs 130s-150s while in the ED. On coreg 3.'125mg'$  BID, diltiazem '240mg'$  daily, losartan '50mg'$  daily at home. Did not take these medications today. Encouraged appropriate adherence. -continue home medications -sodium restriction  LE edema Patient with chronic leg edema which is being managed as an outpatient with PO lasix '80mg'$  daily. Cardiology evaluated today and advised compression stockings to aid with edema. No further workup indicated for this chronic and stable issue. -continue home lasix -compression stockings -sodium restriction  History of A flutter He is on eliquis, but adherence questionable given cost. CTA PE done in ED  without any acute PE. He is in NSR without any ectopy on evaluation today. -continue eliquis -continue home diltiazem  CKD III Kidney function at baseline. Discussed avoiding NSAIDs and other nephrotoxic medications as able.  Disposition: Overall, patient presenting with likely costochondritis and orthostatic presyncope/syncope. Workup in the ED negative for acute concerning etiologies. No further inpatient workup indicated at this time. He is feeling better and would like to go home. This is certainly reasonable and he will follow up at the [facility] for ED follow up and possibly to establish care with a new PCP at that time. Discussed with ED Providers and they also agree with plan.   Signed: Virl Axe, MD 02/09/2023, 3:00 PM

## 2023-02-09 NOTE — ED Triage Notes (Signed)
Pt to ED via EMS with CP and near syncope episode from work. Per EMS upon arrival pt was really weak and confused on what happened. Pt arrives to ED Aox4 in triage withy c.o CP on the right side. Pt states he felt bad this morning then when he got to work felt like he was going to pass out and laid on table. Pt denies falling or any SOB. hX OF htn. Pt received 1 NTG tablet pta which didn't improve symptoms.

## 2023-02-15 ENCOUNTER — Ambulatory Visit: Payer: No Typology Code available for payment source | Admitting: Student

## 2023-02-21 NOTE — Progress Notes (Deleted)
Cardiology Office Note:    Date:  02/21/2023   ID:  Ryan [name], DOB 1961/11/07, MRN [id number]  PCP:  Marliss Coots, NP [place] Cardiologist: Pixie Casino, MD   Reason for visit: ED follow-up  History of Present Illness:    Ryan [name] is a 62 y.o. male with a hx of  chest pain secondary to cocaine use, paroxysmal atrial flutter, hypertension, diabetes, CKD.     [name] the A-fib clinic on January 06, 2022.  Patient reported he had been out of his medications.  He complained of brief palpitations lasting 30 to 40 minutes about twice per month.  He had not been taking Eliquis secondary to cost concerns.  He was restarted on Coreg 3.125 twice daily and diltiazem to 240 mg daily.  He was continued on Eliquis 5 mg twice daily.  I saw patient in February 2023.  Patient felt palpitations, clammy and lightheaded for at least 24 hours.  Patient was in atrial flutter with heart rate 149.  EMS called and patient taken to ED.  Patient given a bolus of diltiazem and heart rate improved to the 70s.  A-fib clinic attempted to call patient to schedule appointment.  Patient was seen in urgent care on on February 02, 2023.  For leg edema, his Lasix was increased to 80 mg a day for 7 days.  Went to the ED on February 09, 2023 with chest pain while at work at [employer].  Chest pain was followed by dizziness and brief syncope.  Patient denied drug use -though drug screen was positive for cocaine.  CT was negative for PE.  Cr 1.29 down from prior b/l 1.4-1.7.  *PCP - for A1C 12.8 in 07/2020 -protonix for ?GERD  Paroxysmal atrial flutter -Echo 2021: EF 65 to 70%, mild LVH, mildly dilated left atria, no significant valve disease. -Worsened by uncontrolled DM and marijuana/cocaine use    History of chest pain secondary to GERD with esophagitis -EGD 2021 showed severe LA grade D erosive esophagitis in the mid and distal esophagus as well as mild  gastritis -***   Hypertension, controlled -Continue current medications. -Goal BP is <130/80.     Obesity -Discussed how even a 5-10% weight loss can have cardiovascular benefits.   -Recommend moderate intensity activity for 30 minutes 5 days/week and the DASH diet.   Disposition - Follow-up with A-fib clinic.    Past Medical History:  Diagnosis Date   Achilles tendon rupture right   Arthritis    back and shoulders   Atrial flutter with rapid ventricular response ([place]) 07/22/2020   Chest pain    due to cocaine use   COVID-19 virus infection 05/31/2019   Diabetes mellitus without complication (HCC)    Drug abuse ([place])    cocaine, marijuana abuse   GERD (gastroesophageal reflux disease)    Hypertension    Renal insufficiency     Past Surgical History:  Procedure Laterality Date   ACHILLES TENDON SURGERY Right 05/02/2014   Procedure: RIGHT ACHILLES TENDON REPAIR;  Surgeon: Johnn Hai, MD;  Location: [facility];  Service: Orthopedics;  Laterality: Right;   BIOPSY  07/25/2020   Procedure: BIOPSY;  Surgeon: Otis Brace, MD;  Location: [facility];  Service: Gastroenterology;;   CARDIAC CATHETERIZATION  05.31.2012   showing patent coronaries with no visualized vasospasm and EF of 55-60 %   ESOPHAGOGASTRODUODENOSCOPY N/A 07/25/2020   Procedure: ESOPHAGOGASTRODUODENOSCOPY (EGD);  Surgeon: Otis Brace, MD;  Location: [facility];  Service: Gastroenterology;  Laterality: N/A;   ORBITAL FRACTURE SURGERY Left 2-3 yrs ago    Current Medications: No outpatient medications have been marked as taking for the 02/23/23 encounter (Appointment) with Warren Lacy, PA-C.     Allergies:   Patient has no known allergies.   Social History   Socioeconomic History   Marital status: Single    Spouse name: Not on file   Number of children: Not on file   Years of education: Not on file   Highest education level: Not on file  Occupational History   Not on file  Tobacco  Use   Smoking status: Never   Smokeless tobacco: Never  Vaping Use   Vaping Use: Never used  Substance and Sexual Activity   Alcohol use: Yes    Alcohol/week: 2.0 standard drinks of alcohol    Types: 2 Shots of liquor per week    Comment: 2 shots once week 01/06/22   Drug use: Not Currently    Types: Cocaine, Marijuana    Comment: last cocaine use 1 year ago, last marijuana use 4 to 5 years ago 01/06/22   Sexual activity: Not on file  Other Topics Concern   Not on file  Social History Narrative   ** Merged History Encounter **       Pt currently lives with his brother, and his sister in [place]. He is employed by [employer]. He denies tobacco use. He does endorse occasional alcohol use. He does endorse cocaine use.  Family history : Non contributory   Social Determinants of Radio broadcast assistant Strain: Not on file  Food Insecurity: Not on file  Transportation Needs: Not on file  Physical Activity: Not on file  Stress: Not on file  Social Connections: Not on file     Family History: The patient's family history includes Unexplained death in his father; Unexplained death (age of onset: 52) in his mother.  ROS:   Please see the history of present illness.     EKGs/Labs/Other Studies Reviewed:    EKG:  The ekg ordered today demonstrates ***  Recent Labs: 02/09/2023: ALT 15; B Natriuretic Peptide 95.1; BUN 13; Creatinine, Ser 1.29; Hemoglobin 13.1; Magnesium 2.0; Platelets 213; Potassium 3.2; Sodium 138   Recent Lipid Panel Lab Results  Component Value Date/Time   CHOL 145 05/13/2011 04:00 AM   TRIG 65 05/13/2011 04:00 AM   HDL 77 05/13/2011 04:00 AM   LDLCALC  05/13/2011 04:00 AM    55        Total Cholesterol/HDL:CHD Risk Coronary Heart Disease Risk Table                     Men   Women  1/2 Average Risk   3.4   3.3  Average Risk       5.0   4.4  2 X Average Risk   9.6   7.1  3 X Average Risk  23.4   11.0        Use the calculated Patient  Ratio above and the CHD Risk Table to determine the patient's CHD Risk.        ATP III CLASSIFICATION (LDL):  <100     mg/dL   Optimal  100-129  mg/dL   Near or Above                    Optimal  130-159  mg/dL   Borderline  160-189  mg/dL  High  >190     mg/dL   Very High    Physical Exam:    VS:  There were no vitals taken for this visit.   No data found.  No BP recorded.  {Refresh Note OR Click here to enter BP  :1}***    Wt Readings from Last 3 Encounters:  02/09/23 242 lb (109.8 kg)  07/26/22 243 lb (110.2 kg)  06/23/22 242 lb (109.8 kg)     GEN: *** Well nourished, well developed in no acute distress HEENT: Normal NECK: No JVD; No carotid bruits CARDIAC: ***RRR, no murmurs, rubs, gallops RESPIRATORY:  Clear to auscultation without rales, wheezing or rhonchi  ABDOMEN: Soft, non-tender, non-distended MUSCULOSKELETAL: No edema SKIN: Warm and dry NEUROLOGIC:  Alert and oriented PSYCHIATRIC:  Normal affect     ASSESSMENT AND PLAN   ***   {Are you ordering a CV Procedure (e.g. stress test, cath, DCCV, TEE, etc)?   Press F2        :[id number]    Medication Adjustments/Labs and Tests Ordered: Current medicines are reviewed at length with the patient today.  Concerns regarding medicines are outlined above.  No orders of the defined types were placed in this encounter.  No orders of the defined types were placed in this encounter.   There are no Patient Instructions on file for this visit.   Signed, Warren Lacy, PA-C  02/21/2023 11:40 AM    [facility]

## 2023-02-22 ENCOUNTER — Ambulatory Visit: Payer: No Typology Code available for payment source | Admitting: General Practice

## 2023-02-23 ENCOUNTER — Ambulatory Visit: Payer: No Typology Code available for payment source | Attending: Physician Assistant | Admitting: Physician Assistant

## 2023-02-23 DIAGNOSIS — E669 Obesity, unspecified: Secondary | ICD-10-CM

## 2023-02-23 DIAGNOSIS — I10 Essential (primary) hypertension: Secondary | ICD-10-CM

## 2023-02-23 DIAGNOSIS — I4892 Unspecified atrial flutter: Secondary | ICD-10-CM

## 2023-03-23 ENCOUNTER — Ambulatory Visit: Payer: No Typology Code available for payment source | Admitting: Physician Assistant

## 2023-04-11 ENCOUNTER — Encounter (HOSPITAL_COMMUNITY): Payer: Self-pay | Admitting: *Deleted

## 2023-04-11 ENCOUNTER — Ambulatory Visit (HOSPITAL_COMMUNITY)
Admission: EM | Admit: 2023-04-11 | Discharge: 2023-04-11 | Disposition: A | Discharge status: Home / Self Care | Payer: 59 | Attending: Emergency Medicine | Admitting: Emergency Medicine

## 2023-04-11 ENCOUNTER — Ambulatory Visit (INDEPENDENT_AMBULATORY_CARE_PROVIDER_SITE_OTHER): Payer: 59

## 2023-04-11 ENCOUNTER — Other Ambulatory Visit: Payer: Self-pay

## 2023-04-11 DIAGNOSIS — M25562 Pain in left knee: Secondary | ICD-10-CM | POA: Diagnosis not present

## 2023-04-11 DIAGNOSIS — J069 Acute upper respiratory infection, unspecified: Secondary | ICD-10-CM | POA: Diagnosis not present

## 2023-04-11 DIAGNOSIS — G8929 Other chronic pain: Secondary | ICD-10-CM

## 2023-04-11 MED ORDER — PROMETHAZINE-DM 6.25-15 MG/5ML PO SYRP: 5.0000 mL | ORAL_SOLUTION | Freq: Four times a day (QID) | ORAL | 0 refills | Status: DC | PRN

## 2023-04-11 MED ORDER — DICLOFENAC SODIUM 1 % EX GEL: 2.0000 g | Freq: Four times a day (QID) | CUTANEOUS | 0 refills | Status: DC

## 2023-04-11 NOTE — ED Triage Notes (Signed)
Pt reports he has congestion,HA and chronic Lt knee pain . Pt has had the HA for 2 days . Pt is working with insurance [place]. To have surgery but needs something for pain in Lt knee.

## 2023-04-11 NOTE — Discharge Instructions (Addendum)
Your xray shows moderate degenerative joint changes as seen on previous imaging. I recommend to try the Voltaren gel up to 4x daily for your knee pain. Continue using tylenol 650 mg every 6 hours as well. Elevate and apply ice to the knee.  Please follow up with both your primary care provider and orthopedic specialist for further evaluation  For cough I recommend to use the cough syrup 4 times daily as needed.  If this makes you drowsy, take only before bed. You can also try honey and throat lozenges. Make sure you are drinking lots of fluids! Hopefully symptoms will improve over the next several days to a week.

## 2023-04-11 NOTE — ED Provider Notes (Signed)
[facility]    CSN: [id number] Arrival date & time: 04/11/23  4098     History   Chief Complaint Chief Complaint  Patient presents with   Headache   Knee Pain   Nasal Congestion   Cough    HPI Ryan [name] is a 62 y.o. male.  Here with 2-day history of headache, nasal congestion, cough.  Cough is nonproductive.  No fevers at home.  Denies shortness of breath or trouble breathing.  He has not taken any medicines for symptoms   Also here with 10/10 left knee pain.  Reports chronic issue, "bone-on-bone" that he needs surgery for.  Does not currently have an orthopedic specialist. Denies new injury or trauma. Reports OTC meds don't work for him (ibu, tylenol)  Reports his next PCP appointment is on Wednesday (2 days)  Hx DM, CKD  Past Medical History:  Diagnosis Date   Achilles tendon rupture right   Arthritis    back and shoulders   Atrial flutter with rapid ventricular response (HCC) 07/22/2020   Chest pain    due to cocaine use   COVID-19 virus infection 05/31/2019   Diabetes mellitus without complication (HCC)    Drug abuse (HCC)    cocaine, marijuana abuse   GERD (gastroesophageal reflux disease)    Hypertension    Renal insufficiency     Patient Active Problem List   Diagnosis Date Noted   Chest pain 02/09/2023   Syncope 02/09/2023   Secondary hypercoagulable state (HCC) 01/06/2022   Foot pain, right 09/26/2020   Closed nondisplaced fracture of proximal phalanx of lesser toe of right foot 09/26/2020   Esophagitis    Midepigastric pain    Atrial flutter (HCC) 07/22/2020   Cocaine abuse (HCC)    Orthostatic syncope 05/31/2019   Diabetes mellitus type 2, insulin dependent (HCC) 05/31/2019   Drug abuse (HCC)    CKD (chronic kidney disease), stage III (HCC)    Right ankle pain 05/15/2014   Uncontrolled hypertension 05/14/2014   Rupture of right Achilles tendon 05/02/2014    Past Surgical History:  Procedure Laterality Date    ACHILLES TENDON SURGERY Right 05/02/2014   Procedure: RIGHT ACHILLES TENDON REPAIR;  Surgeon: Javier Docker, MD;  Location: [facility];  Service: Orthopedics;  Laterality: Right;   BIOPSY  07/25/2020   Procedure: BIOPSY;  Surgeon: Kathi Der, MD;  Location: [facility];  Service: Gastroenterology;;   CARDIAC CATHETERIZATION  05.31.2012   showing patent coronaries with no visualized vasospasm and EF of 55-60 %   ESOPHAGOGASTRODUODENOSCOPY N/A 07/25/2020   Procedure: ESOPHAGOGASTRODUODENOSCOPY (EGD);  Surgeon: Kathi Der, MD;  Location: [facility] END[facility]COPY;  Service: Gastroenterology;  Laterality: N/A;   ORBITAL FRACTURE SURGERY Left 2-3 yrs ago       Home Medications    Prior to Admission medications   Medication Sig Start Date End Date Taking? Authorizing Provider  diclofenac Sodium (VOLTAREN) 1 % GEL Apply 2 g topically 4 (four) times daily. 04/11/23  Yes Shruthi Northrup, Lurena Joiner, PA-C  promethazine-dextromethorphan (PROMETHAZINE-DM) 6.25-15 MG/5ML syrup Take 5 mLs by mouth 4 (four) times daily as needed for cough. 04/11/23  Yes Saprina Chuong, Lurena Joiner, PA-C  carvedilol (COREG) 3.125 MG tablet Take 1 tablet (3.125 mg total) by mouth 2 (two) times daily. 01/06/22   Fenton, Clint R, PA  diltiazem (CARDIZEM CD) 240 MG 24 hr capsule Take 1 capsule (240 mg total) by mouth daily. 01/06/22   Fenton, Clint R, PA  glipiZIDE (GLUCOTROL) 10 MG tablet Take 10 mg by  mouth daily. 03/04/21   [provider]  insulin NPH-regular Human (NOVOLIN 70/30) (70-30) 100 UNIT/ML injection Inject 35 units into the skin 2 times daily with a meal. Patient taking differently: Inject 45 Units into the skin 2 (two) times daily with a meal. 07/25/20   Marjie Skiff E, PA-C  losartan (COZAAR) 50 MG tablet Take 1 tablet (50 mg total) by mouth daily. 08/11/20   Corrin Parker, PA-C  omeprazole (PRILOSEC) 40 MG capsule Take 40 mg by mouth daily. 01/07/21   [provider]  Potassium Chloride ER 20 MEQ TBCR Take 1 tablet  by mouth daily. 10/15/21   [provider]  ZYRTEC ALLERGY 10 MG tablet Take 10 mg by mouth in the morning. 03/03/20   [provider]    Family History Family History  Problem Relation Age of Onset   Unexplained death Mother 13       She died in 05/17/16, he does not remember what she died of   Unexplained death Father        He has no information on his father    Social History Social History   Tobacco Use   Smoking status: Never   Smokeless tobacco: Never  Vaping Use   Vaping Use: Never used  Substance Use Topics   Alcohol use: Yes    Alcohol/week: 2.0 standard drinks of alcohol    Types: 2 Shots of liquor per week    Comment: 2 shots once week 01/06/22   Drug use: Not Currently    Types: Cocaine, Marijuana    Comment: last cocaine use 1 year ago, last marijuana use 4 to 5 years ago 01/06/22     Allergies   Patient has no known allergies.   Review of Systems Review of Systems As per HPI  Physical Exam Triage Vital Signs ED Triage Vitals  Enc Vitals Group     BP 04/11/23 1049 116/76     Pulse Rate 04/11/23 1049 78     Resp 04/11/23 1049 20     Temp 04/11/23 1049 98.1 F (36.7 C)     Temp src --      SpO2 --      Weight --      Height --      Head Circumference --      Peak Flow --      Pain Score 04/11/23 1048 10     Pain Loc --      Pain Edu? --      Excl. in GC? --    No data found.  Updated Vital Signs BP 116/76   Pulse 78   Temp 98.1 F (36.7 C)   Resp 20     Physical Exam Vitals and nursing note reviewed.  Constitutional:      General: He is not in acute distress.    Appearance: He is not ill-appearing.  HENT:     Right Ear: Tympanic membrane and ear canal normal.     Left Ear: Tympanic membrane and ear canal normal.     Nose: No congestion or rhinorrhea.     Mouth/Throat:     Mouth: Mucous membranes are moist.     Pharynx: Oropharynx is clear. No posterior oropharyngeal erythema.  Eyes:     Conjunctiva/sclera:  Conjunctivae normal.  Cardiovascular:     Rate and Rhythm: Normal rate and regular rhythm.     Pulses: Normal pulses.     Heart sounds: Normal heart sounds.  Pulmonary:  Effort: Pulmonary effort is normal.     Breath sounds: Normal breath sounds.  Musculoskeletal:     Cervical back: Normal range of motion.     Left knee: Bony tenderness present. Decreased range of motion. Normal pulse.  Lymphadenopathy:     Cervical: No cervical adenopathy.  Skin:    General: Skin is warm and dry.  Neurological:     Mental Status: He is alert and oriented to person, place, and time.     UC Treatments / Results  Labs (all labs ordered are listed, but only abnormal results are displayed) Labs Reviewed - No data to display  EKG   Radiology DG Knee AP/LAT W/Sunrise Left  Result Date: 04/11/2023 CLINICAL DATA:  Chronic left knee pain. EXAM: LEFT KNEE 3 VIEWS COMPARISON:  February 14, 2018. FINDINGS: Small suprapatellar joint effusion is noted. No fracture or dislocation is noted. Moderate narrowing and osteophyte formation of lateral joint space is noted. Mild narrowing of medial joint space is noted. IMPRESSION: Moderate degenerative joint disease as described above. Small suprapatellar joint effusion. No fracture or dislocation. Electronically Signed   By: Lupita Raider M.D.   On: 04/11/2023 12:01    Procedures Procedures   Medications Ordered in UC Medications - No data to display  Initial Impression / Assessment and Plan / UC Course  I have reviewed the triage vital signs and the nursing notes.  Pertinent labs & imaging results that were available during my care of the patient were reviewed by me and considered in my medical decision making (see chart for details).  Discussed given his history of diabetes and kidney disease, limited in what we can use for pain control.  X-ray today showing moderate degenerative joint disease, no acute change compared with previous x-rays in 2019.   Recommend using Voltaren gel 3-4 times daily, continue Tylenol, follow closely with primary and orthopedic specialist.  Discussed with patient that urgent care does not manage chronic conditions/chronic pain. Suspect also viral URI with cough.  Afebrile and well-appearing.  Clear lungs.  Promethazine DM 4 times daily as needed, other symptomatic care.  He does have his primary care follow-up in 2 days. Return precautions discussed  Final Clinical Impressions(s) / UC Diagnoses   Final diagnoses:  Viral URI with cough  Chronic pain of left knee     Discharge Instructions      Your xray shows moderate degenerative joint changes as seen on previous imaging. I recommend to try the Voltaren gel up to 4x daily for your knee pain. Continue using tylenol 650 mg every 6 hours as well. Elevate and apply ice to the knee.  Please follow up with both your primary care provider and orthopedic specialist for further evaluation  For cough I recommend to use the cough syrup 4 times daily as needed.  If this makes you drowsy, take only before bed. You can also try honey and throat lozenges. Make sure you are drinking lots of fluids! Hopefully symptoms will improve over the next several days to a week.    ED Prescriptions     Medication Sig Dispense Auth. Provider   diclofenac Sodium (VOLTAREN) 1 % GEL Apply 2 g topically 4 (four) times daily. 50 g Ryian Lynde, PA-C   promethazine-dextromethorphan (PROMETHAZINE-DM) 6.25-15 MG/5ML syrup Take 5 mLs by mouth 4 (four) times daily as needed for cough. 118 mL Mickala Laton, Lurena Joiner, PA-C      I have reviewed the PDMP during this encounter.   Oktober Glazer, Lurena Joiner, PA-C  04/11/23 1637  

## 2023-04-25 ENCOUNTER — Encounter (HOSPITAL_COMMUNITY): Payer: Self-pay

## 2023-04-25 ENCOUNTER — Emergency Department (HOSPITAL_COMMUNITY): Payer: 59

## 2023-04-25 ENCOUNTER — Observation Stay (HOSPITAL_COMMUNITY)
Admission: EM | Admit: 2023-04-25 | Discharge: 2023-04-26 | Disposition: A | Discharge status: Home / Self Care | Payer: 59 | Attending: Internal Medicine | Admitting: Internal Medicine

## 2023-04-25 ENCOUNTER — Other Ambulatory Visit: Payer: Self-pay

## 2023-04-25 DIAGNOSIS — I5031 Acute diastolic (congestive) heart failure: Secondary | ICD-10-CM

## 2023-04-25 DIAGNOSIS — N1832 Chronic kidney disease, stage 3b: Secondary | ICD-10-CM

## 2023-04-25 DIAGNOSIS — Z8616 Personal history of COVID-19: Secondary | ICD-10-CM | POA: Insufficient documentation

## 2023-04-25 DIAGNOSIS — Z794 Long term (current) use of insulin: Secondary | ICD-10-CM | POA: Insufficient documentation

## 2023-04-25 DIAGNOSIS — I4892 Unspecified atrial flutter: Principal | ICD-10-CM | POA: Diagnosis present

## 2023-04-25 DIAGNOSIS — F1410 Cocaine abuse, uncomplicated: Secondary | ICD-10-CM | POA: Diagnosis present

## 2023-04-25 DIAGNOSIS — Z79899 Other long term (current) drug therapy: Secondary | ICD-10-CM | POA: Insufficient documentation

## 2023-04-25 DIAGNOSIS — I483 Typical atrial flutter: Secondary | ICD-10-CM | POA: Diagnosis not present

## 2023-04-25 DIAGNOSIS — I10 Essential (primary) hypertension: Secondary | ICD-10-CM | POA: Diagnosis not present

## 2023-04-25 DIAGNOSIS — N1831 Chronic kidney disease, stage 3a: Secondary | ICD-10-CM

## 2023-04-25 DIAGNOSIS — E119 Type 2 diabetes mellitus without complications: Secondary | ICD-10-CM | POA: Diagnosis not present

## 2023-04-25 DIAGNOSIS — I130 Hypertensive heart and chronic kidney disease with heart failure and stage 1 through stage 4 chronic kidney disease, or unspecified chronic kidney disease: Secondary | ICD-10-CM | POA: Diagnosis not present

## 2023-04-25 DIAGNOSIS — N183 Chronic kidney disease, stage 3 unspecified: Secondary | ICD-10-CM | POA: Diagnosis not present

## 2023-04-25 DIAGNOSIS — I5032 Chronic diastolic (congestive) heart failure: Secondary | ICD-10-CM | POA: Diagnosis not present

## 2023-04-25 DIAGNOSIS — I480 Paroxysmal atrial fibrillation: Secondary | ICD-10-CM

## 2023-04-25 DIAGNOSIS — R0602 Shortness of breath: Secondary | ICD-10-CM | POA: Diagnosis not present

## 2023-04-25 DIAGNOSIS — M79672 Pain in left foot: Secondary | ICD-10-CM | POA: Diagnosis not present

## 2023-04-25 DIAGNOSIS — E1122 Type 2 diabetes mellitus with diabetic chronic kidney disease: Secondary | ICD-10-CM | POA: Insufficient documentation

## 2023-04-25 DIAGNOSIS — Z7984 Long term (current) use of oral hypoglycemic drugs: Secondary | ICD-10-CM | POA: Diagnosis not present

## 2023-04-25 DIAGNOSIS — I4891 Unspecified atrial fibrillation: Secondary | ICD-10-CM | POA: Diagnosis not present

## 2023-04-25 DIAGNOSIS — M25572 Pain in left ankle and joints of left foot: Secondary | ICD-10-CM | POA: Diagnosis not present

## 2023-04-25 HISTORY — DX: Chronic kidney disease, stage 3 unspecified: N18.30

## 2023-04-25 HISTORY — DX: Personal history of other medical treatment: Z92.89

## 2023-04-25 LAB — CBC WITH DIFFERENTIAL/PLATELET
Abs Immature Granulocytes: 0.03 10*3/uL (ref 0.00–0.07)
Basophils Absolute: 0 10*3/uL (ref 0.0–0.1)
Basophils Relative: 0 %
Eosinophils Absolute: 0 10*3/uL (ref 0.0–0.5)
Eosinophils Relative: 0 %
HCT: 39.2 % (ref 39.0–52.0)
Hemoglobin: 12.5 g/dL — ABNORMAL LOW (ref 13.0–17.0)
Immature Granulocytes: 0 %
Lymphocytes Relative: 23 %
Lymphs Abs: 2.4 10*3/uL (ref 0.7–4.0)
MCH: 26.8 pg (ref 26.0–34.0)
MCHC: 31.9 g/dL (ref 30.0–36.0)
MCV: 84.1 fL (ref 80.0–100.0)
Monocytes Absolute: 0.8 10*3/uL (ref 0.1–1.0)
Monocytes Relative: 8 %
Neutro Abs: 6.9 10*3/uL (ref 1.7–7.7)
Neutrophils Relative %: 69 %
Platelets: 299 10*3/uL (ref 150–400)
RBC: 4.66 MIL/uL (ref 4.22–5.81)
RDW: 17 % — ABNORMAL HIGH (ref 11.5–15.5)
WBC: 10.1 10*3/uL (ref 4.0–10.5)
nRBC: 0 % (ref 0.0–0.2)

## 2023-04-25 LAB — GLUCOSE, CAPILLARY: Glucose-Capillary: 387 mg/dL — ABNORMAL HIGH (ref 70–99)

## 2023-04-25 LAB — HIV ANTIBODY (ROUTINE TESTING W REFLEX): HIV Screen 4th Generation wRfx: NONREACTIVE

## 2023-04-25 LAB — COMPREHENSIVE METABOLIC PANEL
ALT: 17 U/L (ref 0–44)
AST: 18 U/L (ref 15–41)
Albumin: 3.5 g/dL (ref 3.5–5.0)
Alkaline Phosphatase: 75 U/L (ref 38–126)
Anion gap: 16 — ABNORMAL HIGH (ref 5–15)
BUN: 31 mg/dL — ABNORMAL HIGH (ref 6–20)
CO2: 16 mmol/L — ABNORMAL LOW (ref 22–32)
Calcium: 8.8 mg/dL — ABNORMAL LOW (ref 8.9–10.3)
Chloride: 100 mmol/L (ref 98–111)
Creatinine, Ser: 1.77 mg/dL — ABNORMAL HIGH (ref 0.61–1.24)
GFR, Estimated: 44 mL/min — ABNORMAL LOW (ref >60)
Glucose, Bld: 393 mg/dL — ABNORMAL HIGH (ref 70–99)
Potassium: 3.9 mmol/L (ref 3.5–5.1)
Sodium: 132 mmol/L — ABNORMAL LOW (ref 135–145)
Total Bilirubin: 0.2 mg/dL — ABNORMAL LOW (ref 0.3–1.2)
Total Protein: 6.9 g/dL (ref 6.5–8.1)

## 2023-04-25 LAB — TROPONIN I (HIGH SENSITIVITY)
Troponin I (High Sensitivity): 15 ng/L (ref <18)
Troponin I (High Sensitivity): 14 ng/L (ref <18)

## 2023-04-25 LAB — BRAIN NATRIURETIC PEPTIDE: B Natriuretic Peptide: 782.3 pg/mL — ABNORMAL HIGH (ref 0.0–100.0)

## 2023-04-25 LAB — URIC ACID: Uric Acid, Serum: 6.6 mg/dL (ref 3.7–8.6)

## 2023-04-25 LAB — HEMOGLOBIN A1C
Hgb A1c MFr Bld: 9.5 % — ABNORMAL HIGH (ref 4.8–5.6)
Mean Plasma Glucose: 225.95 mg/dL

## 2023-04-25 MED ORDER — ENOXAPARIN SODIUM 120 MG/0.8ML IJ SOSY
120.0000 mg | PREFILLED_SYRINGE | Freq: Two times a day (BID) | INTRAMUSCULAR | Status: DC
  Administered 2023-04-25 – 2023-04-26 (×2): 120 mg via SUBCUTANEOUS
  Filled 2023-04-25 (×4): qty 0.8

## 2023-04-25 MED ORDER — CARVEDILOL 3.125 MG PO TABS: 3.1250 mg | ORAL_TABLET | Freq: Two times a day (BID) | ORAL | Status: DC

## 2023-04-25 MED ORDER — ACETAMINOPHEN 650 MG RE SUPP: 650.0000 mg | Freq: Four times a day (QID) | RECTAL | Status: DC | PRN

## 2023-04-25 MED ORDER — ACETAMINOPHEN 325 MG PO TABS
650.0000 mg | ORAL_TABLET | Freq: Four times a day (QID) | ORAL | Status: DC | PRN
  Administered 2023-04-26: 650 mg via ORAL
  Filled 2023-04-25: qty 2

## 2023-04-25 MED ORDER — HYDROCODONE-ACETAMINOPHEN 5-325 MG PO TABS
1.0000 | ORAL_TABLET | Freq: Once | ORAL | Status: AC
  Administered 2023-04-25: 1 via ORAL
  Filled 2023-04-25: qty 1

## 2023-04-25 MED ORDER — SODIUM CHLORIDE 0.9 % IV SOLN: INTRAVENOUS | Status: DC

## 2023-04-25 MED ORDER — DILTIAZEM LOAD VIA INFUSION
10.0000 mg | Freq: Once | INTRAVENOUS | Status: AC
  Administered 2023-04-25: 10 mg via INTRAVENOUS
  Filled 2023-04-25: qty 10

## 2023-04-25 MED ORDER — DILTIAZEM HCL-DEXTROSE 125-5 MG/125ML-% IV SOLN (PREMIX)
5.0000 mg/h | INTRAVENOUS | Status: DC
  Administered 2023-04-25: 5 mg/h via INTRAVENOUS
  Administered 2023-04-26: 10 mg/h via INTRAVENOUS
  Filled 2023-04-25 (×3): qty 125

## 2023-04-25 MED ORDER — SODIUM CHLORIDE 0.9% FLUSH
3.0000 mL | Freq: Two times a day (BID) | INTRAVENOUS | Status: DC
  Administered 2023-04-26: 3 mL via INTRAVENOUS

## 2023-04-25 MED ORDER — LACTATED RINGERS IV BOLUS
1000.0000 mL | Freq: Once | INTRAVENOUS | Status: AC
  Administered 2023-04-25: 1000 mL via INTRAVENOUS

## 2023-04-25 MED ORDER — INSULIN ASPART PROT & ASPART (70-30 MIX) 100 UNIT/ML ~~LOC~~ SUSP
25.0000 [IU] | Freq: Two times a day (BID) | SUBCUTANEOUS | Status: DC
  Filled 2023-04-25 (×2): qty 10

## 2023-04-25 MED ORDER — LOSARTAN POTASSIUM 50 MG PO TABS: 50.0000 mg | ORAL_TABLET | Freq: Every day | ORAL | Status: DC

## 2023-04-25 MED ORDER — FENTANYL CITRATE PF 50 MCG/ML IJ SOSY
50.0000 ug | PREFILLED_SYRINGE | Freq: Once | INTRAMUSCULAR | Status: AC
  Administered 2023-04-25: 50 ug via INTRAVENOUS
  Filled 2023-04-25: qty 1

## 2023-04-25 MED ORDER — DILTIAZEM HCL ER COATED BEADS 240 MG PO CP24
240.0000 mg | ORAL_CAPSULE | Freq: Every day | ORAL | Status: DC
  Administered 2023-04-26: 240 mg via ORAL
  Filled 2023-04-25: qty 1

## 2023-04-25 MED ORDER — INSULIN ASPART 100 UNIT/ML IJ SOLN
0.0000 [IU] | Freq: Every day | INTRAMUSCULAR | Status: DC
  Administered 2023-04-25: 5 [IU] via SUBCUTANEOUS

## 2023-04-25 MED ORDER — INSULIN ASPART 100 UNIT/ML IJ SOLN
0.0000 [IU] | Freq: Three times a day (TID) | INTRAMUSCULAR | Status: DC
  Administered 2023-04-26: 5 [IU] via SUBCUTANEOUS
  Administered 2023-04-26: 3 [IU] via SUBCUTANEOUS
  Administered 2023-04-26: 8 [IU] via SUBCUTANEOUS

## 2023-04-25 MED ORDER — POLYETHYLENE GLYCOL 3350 17 G PO PACK: 17.0000 g | PACK | Freq: Every day | ORAL | Status: DC | PRN

## 2023-04-25 MED ORDER — DILTIAZEM HCL 25 MG/5ML IV SOLN
10.0000 mg | Freq: Once | INTRAVENOUS | Status: AC
  Administered 2023-04-25: 10 mg via INTRAVENOUS
  Filled 2023-04-25: qty 5

## 2023-04-25 MED ORDER — PANTOPRAZOLE SODIUM 40 MG PO TBEC
40.0000 mg | DELAYED_RELEASE_TABLET | Freq: Every day | ORAL | Status: DC
  Administered 2023-04-26: 40 mg via ORAL
  Filled 2023-04-25: qty 1

## 2023-04-25 NOTE — ED Provider Notes (Signed)
[place] EMERGENCY DEPARTMENT AT [facility] Provider Note  CSN: [id number] Arrival date & time: 04/25/23 1509  Chief Complaint(s) Foot Pain and Tachycardia  HPI Ryan [name] is a 62 y.o. male with PMH A-flutter, polysubstance abuse, GERD, HTN who presents emergency department for evaluation of foot pain.  Patient states that he rolled his ankle, heard a pop and had significant pain and swelling on the left foot.  Patient arrives with tachycardia in the 160s and when asked about his cardiac medication it appears the patient has been out of his Eliquis and diltiazem for multiple months secondary to cost.  He does endorse 2-3 works of gradually worsening dyspnea on exertion as well.  He currently denies chest pain, abdominal pain, nausea, vomiting or other systemic symptoms.  Denies numbness, tingling, weakness of the lower extremity.   Past Medical History Past Medical History:  Diagnosis Date   Achilles tendon rupture right   Arthritis    back and shoulders   Atrial flutter with rapid ventricular response (HCC) 07/22/2020   Chest pain    due to cocaine use   COVID-19 virus infection 05/31/2019   Diabetes mellitus without complication (HCC)    Drug abuse (HCC)    cocaine, marijuana abuse   GERD (gastroesophageal reflux disease)    Hypertension    Renal insufficiency    Patient Active Problem List   Diagnosis Date Noted   Chest pain 02/09/2023   Syncope 02/09/2023   Secondary hypercoagulable state (HCC) 01/06/2022   Foot pain, right 09/26/2020   Closed nondisplaced fracture of proximal phalanx of lesser toe of right foot 09/26/2020   Esophagitis    Midepigastric pain    Atrial flutter (HCC) 07/22/2020   Cocaine abuse (HCC)    Orthostatic syncope 05/31/2019   Diabetes mellitus type 2, insulin dependent (HCC) 05/31/2019   Drug abuse (HCC)    CKD (chronic kidney disease), stage III (HCC)    Right ankle pain 05/15/2014   Uncontrolled hypertension  05/14/2014   Rupture of right Achilles tendon 05/02/2014   Home Medication(s) Prior to Admission medications   Medication Sig Start Date End Date Taking? Authorizing Provider  carvedilol (COREG) 3.125 MG tablet Take 1 tablet (3.125 mg total) by mouth 2 (two) times daily. 01/06/22   Fenton, Clint R, PA  diclofenac Sodium (VOLTAREN) 1 % GEL Apply 2 g topically 4 (four) times daily. 04/11/23   Rising, Lurena Joiner, PA-C  diltiazem (CARDIZEM CD) 240 MG 24 hr capsule Take 1 capsule (240 mg total) by mouth daily. 01/06/22   Fenton, Clint R, PA  glipiZIDE (GLUCOTROL) 10 MG tablet Take 10 mg by mouth daily. 03/04/21   [provider]  insulin NPH-regular Human (NOVOLIN 70/30) (70-30) 100 UNIT/ML injection Inject 35 units into the skin 2 times daily with a meal. Patient taking differently: Inject 45 Units into the skin 2 (two) times daily with a meal. 07/25/20   Marjie Skiff E, PA-C  losartan (COZAAR) 50 MG tablet Take 1 tablet (50 mg total) by mouth daily. 08/11/20   Corrin Parker, PA-C  omeprazole (PRILOSEC) 40 MG capsule Take 40 mg by mouth daily. 01/07/21   [provider]  Potassium Chloride ER 20 MEQ TBCR Take 1 tablet by mouth daily. 10/15/21   [provider]  promethazine-dextromethorphan (PROMETHAZINE-DM) 6.25-15 MG/5ML syrup Take 5 mLs by mouth 4 (four) times daily as needed for cough. 04/11/23   Rising, Lurena Joiner, PA-C  ZYRTEC ALLERGY 10 MG tablet Take 10 mg by mouth in  the morning. 03/03/20   [provider]                                                                                                                                    Past Surgical History Past Surgical History:  Procedure Laterality Date   ACHILLES TENDON SURGERY Right 2014-05-23   Procedure: RIGHT ACHILLES TENDON REPAIR;  Surgeon: Javier Docker, MD;  Location: [facility] ORS;  Service: Orthopedics;  Laterality: Right;   BIOPSY  07/25/2020   Procedure: BIOPSY;  Surgeon: Kathi Der, MD;   Location: [facility] ENDOSCOPY;  Service: Gastroenterology;;   CARDIAC CATHETERIZATION  05.31.2012   showing patent coronaries with no visualized vasospasm and EF of 55-60 %   ESOPHAGOGASTRODUODENOSCOPY N/A 07/25/2020   Procedure: ESOPHAGOGASTRODUODENOSCOPY (EGD);  Surgeon: Kathi Der, MD;  Location: [facility] ENDOSCOPY;  Service: Gastroenterology;  Laterality: N/A;   ORBITAL FRACTURE SURGERY Left 2-3 yrs ago   Family History Family History  Problem Relation Age of Onset   Unexplained death Mother 55       She died in 2016/05/23, he does not remember what she died of   Unexplained death Father        He has no information on his father    Social History Social History   Tobacco Use   Smoking status: Never   Smokeless tobacco: Never  Vaping Use   Vaping Use: Never used  Substance Use Topics   Alcohol use: Yes    Alcohol/week: 2.0 standard drinks of alcohol    Types: 2 Shots of liquor per week    Comment: 2 shots once week 01/06/22   Drug use: Not Currently    Types: Cocaine, Marijuana    Comment: last cocaine use 1 year ago, last marijuana use 4 to 5 years ago 01/06/22   Allergies Patient has no known allergies.  Review of Systems Review of Systems  Respiratory:  Positive for shortness of breath.   Cardiovascular:  Positive for palpitations.  Musculoskeletal:  Positive for arthralgias, joint swelling and myalgias.    Physical Exam Vital Signs  I have reviewed the triage vital signs BP (!) 123/95   Pulse (!) 157   Temp 98.1 F (36.7 C)   Resp (!) 24   Ht 5\' 9"  (1.753 m)   Wt 109.8 kg   SpO2 94%   BMI 35.74 kg/m   Physical Exam Constitutional:      General: He is not in acute distress.    Appearance: Normal appearance.  HENT:     Head: Normocephalic and atraumatic.     Nose: No congestion or rhinorrhea.  Eyes:     General:        Right eye: No discharge.        Left eye: No discharge.     Extraocular Movements: Extraocular movements intact.     Pupils: Pupils are  equal, round, and reactive to light.  Cardiovascular:  Rate and Rhythm: Tachycardia present. Rhythm irregular.     Heart sounds: No murmur heard. Pulmonary:     Effort: No respiratory distress.     Breath sounds: No wheezing or rales.  Abdominal:     General: There is no distension.     Tenderness: There is no abdominal tenderness.  Musculoskeletal:        General: Swelling and tenderness present. Normal range of motion.     Cervical back: Normal range of motion.  Skin:    General: Skin is warm and dry.  Neurological:     General: No focal deficit present.     Mental Status: He is alert.     ED Results and Treatments Labs (all labs ordered are listed, but only abnormal results are displayed) Labs Reviewed  COMPREHENSIVE METABOLIC PANEL - Abnormal; Notable for the following components:      Result Value   Sodium 132 (*)    CO2 16 (*)    Glucose, Bld 393 (*)    BUN 31 (*)    Creatinine, Ser 1.77 (*)    Calcium 8.8 (*)    Total Bilirubin 0.2 (*)    GFR, Estimated 44 (*)    Anion gap 16 (*)    All other components within normal limits  CBC WITH DIFFERENTIAL/PLATELET - Abnormal; Notable for the following components:   Hemoglobin 12.5 (*)    RDW 17.0 (*)    All other components within normal limits  BRAIN NATRIURETIC PEPTIDE - Abnormal; Notable for the following components:   B Natriuretic Peptide 782.3 (*)    All other components within normal limits  TROPONIN I (HIGH SENSITIVITY)                                                                                                                          Radiology DG Foot Complete Left  Result Date: 04/25/2023 CLINICAL DATA:  Fall, pain and popping sensation EXAM: LEFT FOOT - COMPLETE 3+ VIEW COMPARISON:  04/25/2023 FINDINGS: There is no evidence of fracture or dislocation. There is no evidence of significant arthropathy or other focal bone abnormality. Plantar calcaneal spur noted as well as enthesopathic change at the  Achilles insertion posteriorly. Soft tissues are unremarkable. IMPRESSION: No acute finding by plain radiography. Chronic findings as above. Electronically Signed   By: Judie Petit.  Shick M.D.   On: 04/25/2023 15:58   DG Chest Portable 1 View  Result Date: 04/25/2023 CLINICAL DATA:  Shortness of breath with exertion EXAM: PORTABLE CHEST 1 VIEW COMPARISON:  02/01/2023 FINDINGS: Persistent low lung volumes without focal pneumonia, edema, effusion or pneumothorax. Trachea midline. Normal heart size and vascularity. No acute osseous finding. Telemetry leads overlie the chest. Overall stable exam. IMPRESSION: Low volume exam without acute process. Electronically Signed   By: Judie Petit.  Shick M.D.   On: 04/25/2023 15:56   DG Ankle Complete Left  Result Date: 04/25/2023 CLINICAL DATA:  Pain, popping sensation EXAM: LEFT ANKLE COMPLETE - 3+ VIEW  COMPARISON:  04/25/2023 FINDINGS: There is no evidence of fracture, dislocation, or joint effusion. There is no evidence of significant arthropathy or other focal bone abnormality. Small plantar calcaneal spur. Enthesopathic change of the calcaneus posteriorly at the Achilles insertion. Soft tissues are unremarkable. IMPRESSION: No acute finding by plain radiography. Chronic findings as above. Electronically Signed   By: Judie Petit.  Shick M.D.   On: 04/25/2023 15:55    Pertinent labs & imaging results that were available during my care of the patient were reviewed by me and considered in my medical decision making (see MDM for details).  Medications Ordered in ED Medications  diltiazem (CARDIZEM) 1 mg/mL load via infusion 10 mg (has no administration in time range)    And  diltiazem (CARDIZEM) 125 mg in dextrose 5% 125 mL (1 mg/mL) infusion (has no administration in time range)  lactated ringers bolus 1,000 mL (1,000 mLs Intravenous New Bag/Given 04/25/23 1538)  diltiazem (CARDIZEM) injection 10 mg (10 mg Intravenous Given 04/25/23 1604)  fentaNYL (SUBLIMAZE) injection 50 mcg (50 mcg  Intravenous Given 04/25/23 1605)                                                                                                                                     Procedures .Critical Care  Performed by: Glendora Score, MD Authorized by: Glendora Score, MD   Critical care provider statement:    Critical care time (minutes):  30   Critical care was necessary to treat or prevent imminent or life-threatening deterioration of the following conditions:  Cardiac failure   Critical care was time spent personally by me on the following activities:  Development of treatment plan with patient or surrogate, discussions with consultants, evaluation of patient's response to treatment, examination of patient, ordering and review of laboratory studies, ordering and review of radiographic studies, ordering and performing treatments and interventions, pulse oximetry, re-evaluation of patient's condition and review of old charts   (including critical care time)  Medical Decision Making / ED Course   This patient presents to the ED for concern of foot pain, shortness of breath, this involves an extensive number of treatment options, and is a complaint that carries with it a high risk of complications and morbidity.  The differential diagnosis includes fracture, ligamentous injury, contusion, hematoma, sprain, A-fib, a flutter, electrolyte abnormality, high-output cardiac failure, CHF, polysubstance use  MDM: Patient seen emergency room for evaluation of multiple complaints described above.  Physical exam with tenderness over the dorsum of the left foot, rapid irregular tachycardia with rates in the 160s.  ECG concerning for a flutter.  Laboratory evaluation with a mild anemia to 12.5, BNP elevated to 782.3, high-sensitivity troponin is normal, bicarb 16, glucose 393, fluid resuscitation begun and as patient has been out of his Eliquis he is not a candidate for bedside cardioversion.  No response to initial 10  mg diltiazem push.  Patient then received an additional  10 mg load and started on a diltiazem drip.  I spoke with the cardiologist on-call who will evaluate patient while inpatient.  X-rays of lower extremity without evidence of fracture.  Patient then admitted to medicine for A-fib with RVR versus a flutter   Additional history obtained:  -External records from outside source obtained and reviewed including: Chart review including previous notes, labs, imaging, consultation notes   Lab Tests: -I ordered, reviewed, and interpreted labs.   The pertinent results include:   Labs Reviewed  COMPREHENSIVE METABOLIC PANEL - Abnormal; Notable for the following components:      Result Value   Sodium 132 (*)    CO2 16 (*)    Glucose, Bld 393 (*)    BUN 31 (*)    Creatinine, Ser 1.77 (*)    Calcium 8.8 (*)    Total Bilirubin 0.2 (*)    GFR, Estimated 44 (*)    Anion gap 16 (*)    All other components within normal limits  CBC WITH DIFFERENTIAL/PLATELET - Abnormal; Notable for the following components:   Hemoglobin 12.5 (*)    RDW 17.0 (*)    All other components within normal limits  BRAIN NATRIURETIC PEPTIDE - Abnormal; Notable for the following components:   B Natriuretic Peptide 782.3 (*)    All other components within normal limits  TROPONIN I (HIGH SENSITIVITY)      EKG   EKG Interpretation  Date/Time:  Monday Apr 25 2023 15:20:54 EDT Ventricular Rate:  157 PR Interval:  96 QRS Duration: 100 QT Interval:  306 QTC Calculation: 494 R Axis:   53 Text Interpretation: atrial flutter 2:1 Septal infarct , age undetermined ST & T wave abnormality, consider lateral ischemia Abnormal ECG When compared with ECG of 09-Feb-2023 09:55, PREVIOUS ECG IS PRESENT Confirmed by Edwena Mayorga ([id number]) on 04/26/2023 2:36:20 PM         Imaging Studies ordered: I ordered imaging studies including chest x-ray, foot and ankle x-ray I independently visualized and interpreted imaging. I agree  with the radiologist interpretation   Medicines ordered and prescription drug management: Meds ordered this encounter  Medications   lactated ringers bolus 1,000 mL   diltiazem (CARDIZEM) injection 10 mg   fentaNYL (SUBLIMAZE) injection 50 mcg   AND Linked Order Group    diltiazem (CARDIZEM) 1 mg/mL load via infusion 10 mg    diltiazem (CARDIZEM) 125 mg in dextrose 5% 125 mL (1 mg/mL) infusion    -I have reviewed the patients home medicines and have made adjustments as needed  Critical interventions Diltiazem push, diltiazem drip, fluid resuscitation  Consultations Obtained: I requested consultation with the cardiologist on-call,  and discussed lab and imaging findings as well as pertinent plan - they recommend: Inpatient hospital evaluation   Cardiac Monitoring: The patient was maintained on a cardiac monitor.  I personally viewed and interpreted the cardiac monitored which showed an underlying rhythm of: Atrial flutter  Social Determinants of Health:  Factors impacting patients care include: History of polysubstance use, has not used in many weeks   Reevaluation: After the interventions noted above, I reevaluated the patient and found that they have :improved  Co morbidities that complicate the patient evaluation  Past Medical History:  Diagnosis Date   Achilles tendon rupture right   Arthritis    back and shoulders   Atrial flutter with rapid ventricular response (HCC) 07/22/2020   Chest pain    due to cocaine use   COVID-19 virus infection 05/31/2019  Diabetes mellitus without complication (HCC)    Drug abuse (HCC)    cocaine, marijuana abuse   GERD (gastroesophageal reflux disease)    Hypertension    Renal insufficiency       Dispostion: I considered admission for this patient, and due to a flutter requiring diltiazem drip patient require hospital admission     Final Clinical Impression(s) / ED Diagnoses Final diagnoses:  None      @PCDICTATION @    Glendora Score, MD 04/26/23 1437

## 2023-04-25 NOTE — Consult Note (Addendum)
Cardiology Consult    Patient ID: Ryan [name] MRN: [id number], DOB/AGE: 1961/11/07   Admit date: 04/25/2023 Date of Consult: 04/25/2023  Primary Physician: Lavinia Sharps, NP Primary Cardiologist: Chrystie Nose, MD Requesting Provider: A. Alinda Money, MD  Patient Profile    Ryan [name] is a 62 y.o. male with a history of chest pain in the setting of cocaine use, persistent atrial flutter, hypertension, type 2 diabetes mellitus, GERD with esophagitis, stage III chronic kidney disease, and noncompliance, who is being seen today for the evaluation of rapid atrial flutter at the request of Dr. Alinda Money.  Past Medical History   Past Medical History:  Diagnosis Date   Achilles tendon rupture right   Arthritis    back and shoulders   Atrial flutter with rapid ventricular response (HCC) 07/22/2020   Chest pain    a. 05/2011 - admit w/ c/p in setting of cocaine-->Cath:  nl cors, nl EF, no vasospasm noted.   CKD (chronic kidney disease), stage III (HCC)    COVID-19 virus infection 05/31/2019   Diabetes mellitus without complication (HCC)    Drug abuse (HCC)    cocaine, marijuana abuse   GERD (gastroesophageal reflux disease)    History of echocardiogram    a. 07/2020 Echo: EF 65-70%, no rwma, mild conc LVH, nl RV fxn, mildly dil LA.   Hypertension     Past Surgical History:  Procedure Laterality Date   ACHILLES TENDON SURGERY Right 05/02/2014   Procedure: RIGHT ACHILLES TENDON REPAIR;  Surgeon: Javier Docker, MD;  Location: [facility];  Service: Orthopedics;  Laterality: Right;   BIOPSY  07/25/2020   Procedure: BIOPSY;  Surgeon: Kathi Der, MD;  Location: [facility];  Service: Gastroenterology;;   CARDIAC CATHETERIZATION  05.31.2012   showing patent coronaries with no visualized vasospasm and EF of 55-60 %   ESOPHAGOGASTRODUODENOSCOPY N/A 07/25/2020   Procedure: ESOPHAGOGASTRODUODENOSCOPY (EGD);  Surgeon: Kathi Der, MD;  Location: [facility];   Service: Gastroenterology;  Laterality: N/A;   ORBITAL FRACTURE SURGERY Left 2-3 yrs ago     Allergies  No Known Allergies  History of Present Illness    62 y.o. male with a history of chest pain in the setting of cocaine use, persistent atrial flutter, hypertension, type 2 diabetes mellitus, GERD with esophagitis, stage III chronic kidney disease, and noncompliance.  In May 2012, he was admitted to the hospital with chest pain in the setting of cocaine abuse.  He underwent diagnostic catheterization which showed normal LV function with patent coronary arteries and no visualized vasospasm.  He was subsequently treated for presumed coronary vasospasm.  In August 2021, patient was admitted with atrial flutter with rapid ventricular response, as well as chest pain.  Drug screen was positive for cocaine.  Echo showed an EF of 65-75% with normal wall motion.  He was placed on IV diltiazem and converted to sinus rhythm and was subsequently transition to oral diltiazem.  Eliquis therapy was initiated.  He had mild troponin elevation to peak at 21 in the setting of demand ischemia.  During hospitalization, he had worsening chest pain.  CTA was negative for dissection but showed moderate hiatal hernia and probable reflux.  EGD showed severe LA grade D erosive esophagitis in the mid and distal esophagus as well as mild gastritis.  He was placed on PPI therapy.  He has not been seen in cardiology clinic since 2021, though he was seen in A-fib clinic in early 2023.  Note indicates at that  time that he was still taking Eliquis though he says today that he likely was not as he could not afford it.  He has remained on diltiazem CD240 mg daily and this has been refilled by his primary care provider however, approximately 2 months ago, he ran out and was unable to refill due to cost.  For the most part, he has done well without symptoms or limitations.  He works at [employer].  He generally has good energy and does not  think that he snores at night.  He was in his usual state of health until approximately 2-3 evenings ago, when he started noticing elevated heart rates, and could hear his heart beating fast when he would lay down for bed at night.  Since then, he has noted some dyspnea on exertion and mild right greater than left lower extremity swelling, which is largely dependent in nature and not necessarily present first thing in the morning.  This morning, while at work, he noted a snapping sensation in his foot which was sharp and painful.  This occurred every time he walked and he was unable to bear weight, prompting ED evaluation.  Here, he was noted to be in rapid atrial flutter at 157 bpm with diffuse ST and T abnormalities.  Labs notable for sodium 132, glucose of 393, BUN of 31, creatinine of 1.77 (1.29 in February 2024).  BNP elevated at 782.3.  Initial troponin normal at 15.  Chest x-ray shows no acute abnormalities.  Imaging of the left foot and ankle show no acute abnormalities.  Currently on IV diltiazem with rates in the 1 teens to 120s.  He is asymptomatic at this time.  Inpatient Medications     carvedilol  3.125 mg Oral BID   [START ON 04/26/2023] diltiazem  240 mg Oral Daily   enoxaparin (LOVENOX) injection  120 mg Subcutaneous Q12H   insulin aspart  0-15 Units Subcutaneous TID WC   insulin aspart  0-5 Units Subcutaneous QHS   insulin aspart protamine- aspart  25 Units Subcutaneous BID WC   [START ON 04/26/2023] losartan  50 mg Oral Daily   [START ON 04/26/2023] pantoprazole  40 mg Oral Daily   sodium chloride flush  3 mL Intravenous Q12H    Family History    Family History  Problem Relation Age of Onset   Unexplained death Mother 27       She died in 05-13-16, he does not remember what she died of   Unexplained death Father        He has no information on his father   He indicated that his mother is deceased. He indicated that his father is deceased.   Social History    Social History    Socioeconomic History   Marital status: Single    Spouse name: Not on file   Number of children: Not on file   Years of education: Not on file   Highest education level: Not on file  Occupational History   Not on file  Tobacco Use   Smoking status: Never   Smokeless tobacco: Never  Vaping Use   Vaping Use: Never used  Substance and Sexual Activity   Alcohol use: Not Currently    Alcohol/week: 5.0 standard drinks of alcohol    Types: 5 Standard drinks or equivalent per week   Drug use: Not Currently    Types: Cocaine, Marijuana    Comment: last cocaine use 1 year ago, last marijuana use 4 to 5  years ago 01/06/22   Sexual activity: Not on file  Other Topics Concern   Not on file  Social History Narrative   ** Merged History Encounter **       Pt currently lives with his brother, and his sister in [place]. He is employed by [employer]. He denies tobacco use. He does endorse occasional alcohol use. He does endorse cocaine use.  Family history : Non contributory   Social Determinants of Health   Financial Resource Strain: Not on file  Food Insecurity: No Food Insecurity (04/25/2023)   Hunger Vital Sign    Worried About Running Out of Food in the Last Year: Never true    Ran Out of Food in the Last Year: Never true  Transportation Needs: No Transportation Needs (04/25/2023)   PRAPARE - Administrator, Civil Service (Medical): No    Lack of Transportation (Non-Medical): No  Physical Activity: Not on file  Stress: Not on file  Social Connections: Not on file  Intimate Partner Violence: Not At Risk (04/25/2023)   Humiliation, Afraid, Rape, and Kick questionnaire    Fear of Current or Ex-Partner: No    Emotionally Abused: No    Physically Abused: No    Sexually Abused: No     Review of Systems    General:  No chills, fever, night sweats or weight changes.  Cardiovascular:  +++ Dyspnea on exertion, palpitations, and mild lower extremity edema.  He denies  chest pain, orthopnea, paroxysmal nocturnal dyspnea. Dermatological: No rash, lesions/masses Respiratory: No cough, +++ dyspnea Urologic: No hematuria, dysuria Abdominal:   No nausea, vomiting, diarrhea, bright red blood per rectum, melena, or hematemesis Neurologic:  No visual changes, wkns, changes in mental status. All other systems reviewed and are otherwise negative except as noted above.  Physical Exam    Blood pressure (!) 135/96, pulse (!) 126, temperature 98 F (36.7 C), temperature source Oral, resp. rate (!) 28, height 5\' 9"  (1.753 m), weight 109.8 kg, SpO2 95 %.  General: Pleasant, NAD Psych: Normal affect. Neuro: Alert and oriented X 3. Moves all extremities spontaneously. HEENT: Normal  Neck: Supple without bruits or JVD. Lungs:  Resp regular and unlabored, CTA. Heart: Irregularly irregular, tachycardic no s3, s4, or murmurs. Abdomen: Obese, soft, non-tender, non-distended, BS + x 4.  Extremities: No clubbing, cyanosis or 1+ right ankle and trace left ankle edema. DP/PT2+, Radials 2+ and equal bilaterally.  The left foot is tender to light touch.  Labs    Cardiac Enzymes Recent Labs  Lab 04/25/23 1555 04/25/23 1736  TROPONINIHS 15 14     BNP    Component Value Date/Time   BNP 782.3 (H) 04/25/2023 1556    Lab Results  Component Value Date   WBC 10.1 04/25/2023   HGB 12.5 (L) 04/25/2023   HCT 39.2 04/25/2023   MCV 84.1 04/25/2023   PLT 299 04/25/2023    Recent Labs  Lab 04/25/23 1555  NA 132*  K 3.9  CL 100  CO2 16*  BUN 31*  CREATININE 1.77*  CALCIUM 8.8*  PROT 6.9  BILITOT 0.2*  ALKPHOS 75  ALT 17  AST 18  GLUCOSE 393*    Radiology Studies    DG Foot Complete Left  Result Date: 04/25/2023 CLINICAL DATA:  Fall, pain and popping sensation EXAM: LEFT FOOT - COMPLETE 3+ VIEW COMPARISON:  04/25/2023 FINDINGS: There is no evidence of fracture or dislocation. There is no evidence of significant arthropathy or other focal bone abnormality.  Plantar calcaneal spur noted as well as enthesopathic change at the Achilles insertion posteriorly. Soft tissues are unremarkable. IMPRESSION: No acute finding by plain radiography. Chronic findings as above. Electronically Signed   By: Judie Petit.  Shick M.D.   On: 04/25/2023 15:58   DG Chest Portable 1 View  Result Date: 04/25/2023 CLINICAL DATA:  Shortness of breath with exertion EXAM: PORTABLE CHEST 1 VIEW COMPARISON:  02/01/2023 FINDINGS: Persistent low lung volumes without focal pneumonia, edema, effusion or pneumothorax. Trachea midline. Normal heart size and vascularity. No acute osseous finding. Telemetry leads overlie the chest. Overall stable exam. IMPRESSION: Low volume exam without acute process. Electronically Signed   By: Judie Petit.  Shick M.D.   On: 04/25/2023 15:56   DG Ankle Complete Left  Result Date: 04/25/2023 CLINICAL DATA:  Pain, popping sensation EXAM: LEFT ANKLE COMPLETE - 3+ VIEW COMPARISON:  04/25/2023 FINDINGS: There is no evidence of fracture, dislocation, or joint effusion. There is no evidence of significant arthropathy or other focal bone abnormality. Small plantar calcaneal spur. Enthesopathic change of the calcaneus posteriorly at the Achilles insertion. Soft tissues are unremarkable. IMPRESSION: No acute finding by plain radiography. Chronic findings as above. Electronically Signed   By: Judie Petit.  Shick M.D.   On: 04/25/2023 15:55   DG Knee AP/LAT W/Sunrise Left  Result Date: 04/11/2023 CLINICAL DATA:  Chronic left knee pain. EXAM: LEFT KNEE 3 VIEWS COMPARISON:  February 14, 2018. FINDINGS: Small suprapatellar joint effusion is noted. No fracture or dislocation is noted. Moderate narrowing and osteophyte formation of lateral joint space is noted. Mild narrowing of medial joint space is noted. IMPRESSION: Moderate degenerative joint disease as described above. Small suprapatellar joint effusion. No fracture or dislocation. Electronically Signed   By: Lupita Raider M.D.   On: 04/11/2023 12:01     ECG & Cardiac Imaging    Atrial flutter, 157, septal infarct diffuse ST/T abnormalities- personally reviewed.  Assessment & Plan    1.  Atrial flutter with rapid ventricular response: Patient with a history of atrial flutter previously in the setting of cocaine use in 2021, which has been quiescent on oral diltiazem therapy in the outpatient setting.  CHA2DS2-VASc equals 2.  He has not been on oral anticoagulation secondary to inability to afford.  He was in his usual state of health until approximately 2 evenings ago, when he started noticing elevated heart rates.  Since then, he has noted some dyspnea on exertion and mild lower extremity edema, as well as ongoing palpitations.  He actually presented to the emergency department due to left foot pain and was found to be in rapid atrial flutter at 157 bpm.  Rates currently improved in the 1 teens to 120s on IV diltiazem, which was previously successful in helping him to convert in 2021.  Continue diltiazem and I will add intravenous heparin with plan for TEE and cardioversion in the morning provided that he does not already convert.  Risks and benefits of TEE and cardioversion explained to patient in detail and he agrees to proceed.  Continue beta-blocker.  Needs outpatient sleep study and will need assistance with oral anticoagulation at discharge.  2.  Essential hypertension: Blood pressure mildly elevated in the emergency department.  Continue IV diltiazem.  Carvedilol and ARB also added.  Follow.  3.  Type 2 diabetes mellitus: Management per primary team.  4.  Left foot pain/? gout: Very tender to touch.  No acute findings by imaging.  Suspect gout.  Defer management to primary team.  5.  Alcohol use: At least 5 alcoholic beverages per week.  Cessation advised.  6.  Probable HFpEF: Dyspnea on exertion over the past few days with in the setting of tachycardia with elevated BNP of 782.3.  Chest x-ray unremarkable.  Plan for TEE to reevaluate LV  function.  In the setting of no significant volume overload on examination, chronic kidney disease, and probable gout, will hold off on diuresis.  7.  Stage III chronic kidney disease: Appears to be relatively stable.  Follow.   Risk Assessment/Risk Scores:          CHA2DS2-VASc Score = 2   This indicates a 2.2% annual risk of stroke. The patient's score is based upon: CHF History: 0 HTN History: 1 Diabetes History: 1 Stroke History: 0 Vascular Disease History: 0 Age Score: 0 Gender Score: 0     Signed, Nicolasa Ducking, NP 04/25/2023, 7:38 PM  For questions or updates, please contact   Please consult [email or web address] for contact info under Cardiology/STEMI.  I have seen and examined the patient along with Nicolasa Ducking, NP.  I have reviewed the chart, notes and new data.  I agree with PA/NP's note.  Key new complaints: palpitations and exertional dyspnea for about 3 days. Has some symptoms of daytime hypersomnolence, but denies snoring.  Extreme discomfort and tenderness in MTP joints (no known gout history). Key examination changes: Obese, thick neck and crowded oropharynx; rapid irregular rhythm, no murmurs,; chronic asymmetric lower extremity edema R>L with scars of healed venous stasis ulcers, tenderness to touch L MTP joint, but not obviously swollen or erythematous. Key new findings / data: Creat 1.77 (baseline 1.4-1.5), elevated BNP (well above baseline); ECG shows typical counterclockwise atrial flutter w 2:1 AV block.  PLAN: Rate control overnight, plan for TEE-DCCV, hopefully tomorrow. This procedure has been fully reviewed with the patient and informed consent has been obtained. Plan anticoagulation for at least 30 days after DCCV (CHADSVasc 2 - HTN, DM). Will need assistance for DOAC. Ideally will be referred for AFlutter cavotricuspid isthmus ablation in view of his young age and recurrent events. No AFib to date. Will hold off diuretics due to abnormal renal  function and possible gout. He does not have dyspnea at rest or orthopnea. Will need evaluation for OSA as outpt.    Thurmon Fair, MD, [facility] [phone] 04/25/2023, 7:59 PM

## 2023-04-25 NOTE — H&P (Signed)
History and Physical   Talor Conwill ZOX:[id number] DOB: [birth year] DOA: 04/25/2023  PCP: Lavinia Sharps, NP   Patient coming from: Home  Chief Complaint: Foot pain  HPI: Ryan [name] is a 62 y.o. male with medical history significant of diabetes, hypertension, atrial flutter, CKD 3A, GERD, substance use presenting with foot pain.  Patient reports that earlier today he injured his left foot while walking.  Not sure if he rolled it or not.  This does states the mid top portion of his foot has been painful and popping.  Does report some shortness of breath for the past several weeks.  Denies fevers, chills, chest pain, abdominal pain, constipation, diarrhea, nausea, vomiting.  ED Course: Vital signs in the ED notable for heart rate in the 120s to 150s, respiratory rate in the teens to 20s.  Lab workup included CMP with sodium 132, bicarb 16, BUN 31, creatinine 1.77 which is near baseline, glucose 393, calcium 8.8.  CBC with hemoglobin stable at 12.5.  Troponin negative.  BNP elevated to 72.  Chest x-ray with low volumes but no acute abnormality.  Left foot and left ankle x-rays without acute abnormality.  Patient received fentanyl, Norco, IV diltiazem followed by diltiazem drip in the ED.  Also received a liter of IV fluids.  Review of Systems: As per HPI otherwise all other systems reviewed and are negative.  Past Medical History:  Diagnosis Date   Achilles tendon rupture right   Arthritis    back and shoulders   Atrial flutter with rapid ventricular response (HCC) 07/22/2020   Chest pain    due to cocaine use   COVID-19 virus infection 05/31/2019   Diabetes mellitus without complication (HCC)    Drug abuse (HCC)    cocaine, marijuana abuse   GERD (gastroesophageal reflux disease)    Hypertension    Renal insufficiency     Past Surgical History:  Procedure Laterality Date   ACHILLES TENDON SURGERY Right May 18, 2014   Procedure: RIGHT ACHILLES TENDON  REPAIR;  Surgeon: Javier Docker, MD;  Location: [facility];  Service: Orthopedics;  Laterality: Right;   BIOPSY  07/25/2020   Procedure: BIOPSY;  Surgeon: Kathi Der, MD;  Location: [facility];  Service: Gastroenterology;;   CARDIAC CATHETERIZATION  05.31.2012   showing patent coronaries with no visualized vasospasm and EF of 55-60 %   ESOPHAGOGASTRODUODENOSCOPY N/A 07/25/2020   Procedure: ESOPHAGOGASTRODUODENOSCOPY (EGD);  Surgeon: Kathi Der, MD;  Location: [facility];  Service: Gastroenterology;  Laterality: N/A;   ORBITAL FRACTURE SURGERY Left 2-3 yrs ago    Social History  reports that he has never smoked. He has never used smokeless tobacco. He reports current alcohol use of about 2.0 standard drinks of alcohol per week. He reports that he does not currently use drugs after having used the following drugs: Cocaine and Marijuana.  No Known Allergies  Family History  Problem Relation Age of Onset   Unexplained death Mother 27       She died in 05-18-16, he does not remember what she died of   Unexplained death Father        He has no information on his father  Reviewed on admission  Prior to Admission medications   Medication Sig Start Date End Date Taking? Authorizing Provider  carvedilol (COREG) 3.125 MG tablet Take 1 tablet (3.125 mg total) by mouth 2 (two) times daily. 01/06/22   Fenton, Clint R, PA  diclofenac Sodium (VOLTAREN) 1 % GEL Apply 2 g topically  4 (four) times daily. 04/11/23   Rising, Lurena Joiner, PA-C  diltiazem (CARDIZEM CD) 240 MG 24 hr capsule Take 1 capsule (240 mg total) by mouth daily. 01/06/22   Fenton, Clint R, PA  glipiZIDE (GLUCOTROL) 10 MG tablet Take 10 mg by mouth daily. 03/04/21   [provider]  insulin NPH-regular Human (NOVOLIN 70/30) (70-30) 100 UNIT/ML injection Inject 35 units into the skin 2 times daily with a meal. Patient taking differently: Inject 45 Units into the skin 2 (two) times daily with a meal. 07/25/20   Marjie Skiff  E, PA-C  losartan (COZAAR) 50 MG tablet Take 1 tablet (50 mg total) by mouth daily. 08/11/20   Corrin Parker, PA-C  omeprazole (PRILOSEC) 40 MG capsule Take 40 mg by mouth daily. 01/07/21   [provider]  Potassium Chloride ER 20 MEQ TBCR Take 1 tablet by mouth daily. 10/15/21   [provider]  promethazine-dextromethorphan (PROMETHAZINE-DM) 6.25-15 MG/5ML syrup Take 5 mLs by mouth 4 (four) times daily as needed for cough. 04/11/23   Rising, Lurena Joiner, PA-C  ZYRTEC ALLERGY 10 MG tablet Take 10 mg by mouth in the morning. 03/03/20   [provider]    Physical Exam: Vitals:   04/25/23 1745 04/25/23 1750 04/25/23 1815 04/25/23 1820  BP: (!) 115/93 116/89 (!) 134/95 (!) 127/108  Pulse: (!) 137 (!) 136 (!) 124 (!) 123  Resp: 16 17 (!) 23 (!) 27  Temp:      TempSrc:      SpO2: 97% 97% 95% 96%  Weight:      Height:        Physical Exam Constitutional:      General: He is not in acute distress.    Appearance: Normal appearance.  HENT:     Head: Normocephalic and atraumatic.     Mouth/Throat:     Mouth: Mucous membranes are moist.     Pharynx: Oropharynx is clear.  Eyes:     Extraocular Movements: Extraocular movements intact.     Pupils: Pupils are equal, round, and reactive to light.  Cardiovascular:     Rate and Rhythm: Tachycardia present. Rhythm irregular.     Pulses: Normal pulses.     Heart sounds: Normal heart sounds.  Pulmonary:     Effort: Pulmonary effort is normal. No respiratory distress.     Breath sounds: Normal breath sounds.  Abdominal:     General: Bowel sounds are normal. There is no distension.     Palpations: Abdomen is soft.     Tenderness: There is no abdominal tenderness.  Musculoskeletal:        General: No swelling or deformity.  Skin:    General: Skin is warm and dry.  Neurological:     General: No focal deficit present.     Mental Status: Mental status is at baseline.    Labs on Admission: I have personally reviewed  following labs and imaging studies  CBC: Recent Labs  Lab 04/25/23 1555  WBC 10.1  NEUTROABS 6.9  HGB 12.5*  HCT 39.2  MCV 84.1  PLT 299    Basic Metabolic Panel: Recent Labs  Lab 04/25/23 1555  NA 132*  K 3.9  CL 100  CO2 16*  GLUCOSE 393*  BUN 31*  CREATININE 1.77*  CALCIUM 8.8*    GFR: Estimated Creatinine Clearance: 54.9 mL/min (A) (by C-G formula based on SCr of 1.77 mg/dL (H)).  Liver Function Tests: Recent Labs  Lab 04/25/23 1555  AST 18  ALT 17  ALKPHOS 75  BILITOT 0.2*  PROT 6.9  ALBUMIN 3.5    Urine analysis:    Component Value Date/Time   COLORURINE YELLOW 07/26/2022 1445   APPEARANCEUR CLEAR 07/26/2022 1445   LABSPEC 1.021 07/26/2022 1445   PHURINE 5.0 07/26/2022 1445   GLUCOSEU NEGATIVE 07/26/2022 1445   HGBUR NEGATIVE 07/26/2022 1445   BILIRUBINUR NEGATIVE 07/26/2022 1445   KETONESUR NEGATIVE 07/26/2022 1445   PROTEINUR 30 (A) 07/26/2022 1445   UROBILINOGEN 1.0 11/06/2013 1612   NITRITE NEGATIVE 07/26/2022 1445   LEUKOCYTESUR NEGATIVE 07/26/2022 1445    Radiological Exams on Admission: DG Foot Complete Left  Result Date: 04/25/2023 CLINICAL DATA:  Fall, pain and popping sensation EXAM: LEFT FOOT - COMPLETE 3+ VIEW COMPARISON:  04/25/2023 FINDINGS: There is no evidence of fracture or dislocation. There is no evidence of significant arthropathy or other focal bone abnormality. Plantar calcaneal spur noted as well as enthesopathic change at the Achilles insertion posteriorly. Soft tissues are unremarkable. IMPRESSION: No acute finding by plain radiography. Chronic findings as above. Electronically Signed   By: Judie Petit.  Shick M.D.   On: 04/25/2023 15:58   DG Chest Portable 1 View  Result Date: 04/25/2023 CLINICAL DATA:  Shortness of breath with exertion EXAM: PORTABLE CHEST 1 VIEW COMPARISON:  02/01/2023 FINDINGS: Persistent low lung volumes without focal pneumonia, edema, effusion or pneumothorax. Trachea midline. Normal heart size and  vascularity. No acute osseous finding. Telemetry leads overlie the chest. Overall stable exam. IMPRESSION: Low volume exam without acute process. Electronically Signed   By: Judie Petit.  Shick M.D.   On: 04/25/2023 15:56   DG Ankle Complete Left  Result Date: 04/25/2023 CLINICAL DATA:  Pain, popping sensation EXAM: LEFT ANKLE COMPLETE - 3+ VIEW COMPARISON:  04/25/2023 FINDINGS: There is no evidence of fracture, dislocation, or joint effusion. There is no evidence of significant arthropathy or other focal bone abnormality. Small plantar calcaneal spur. Enthesopathic change of the calcaneus posteriorly at the Achilles insertion. Soft tissues are unremarkable. IMPRESSION: No acute finding by plain radiography. Chronic findings as above. Electronically Signed   By: Judie Petit.  Shick M.D.   On: 04/25/2023 15:55    EKG: Independently reviewed.  Sinus tach versus 2-1 atrial flutter with RVR at 157 bpm.  Subsequent improvement in rate has revealed underlying a flutter.  Assessment/Plan Principal Problem:   Atrial flutter with rapid ventricular response (HCC) Active Problems:   Uncontrolled hypertension   Diabetes mellitus type 2, insulin dependent (HCC)   CKD (chronic kidney disease), stage III (HCC)   Atrial flutter (HCC)   Cocaine abuse (HCC)   Atrial flutter with RVR > History of atrial flutter.  Has been out of his diltiazem for a month and anticoagulation for 3 months due to financial issues. > Rates to the 150s initially in the ED.  Some shortness of breath last few weeks, but otherwise no significant symptoms as he was coming in for rolling his ankle. > Cardiology was consulted by EDP and patient was started on diltiazem drip in the ED. > Troponin negative.  BNP elevated to 722 with lower extremity edema but no rales.  Unsure if a degree of heart failure in the setting of likely several weeks RVR.  Likely will benefit from switching from diltiazem to amiodarone, however cardiology's been consulted and will await  their recommendations. > States he will be able to get back on diltiazem but the cost of his Eliquis is prohibitive.  We discussed options of warfarin with frequent checks versus injections  with Lovenox and patient prefers the injection option > Will get echocardiogram to further evaluate - Monitor in progressive unit - Appreciate cardiology recommendations - Continue diltiazem drip for now, may transition to amiodarone - Anticoagulation with Lovenox - TOC consult for PCP, patient interested in IM residency clinic but open to others. - Echocardiogram  Foot injury > Came in complaining of left foot pain.  Popping sensation and pain on the dorsal surface of his foot.  Does not appear to have rolled ankle.  No fracture on x-ray in ED. - Cam boot - Consider further imaging versus outpatient sports medicine referral for [facility] ultrasound  Hypertension - Continue home losartan, carvedilol, diltiazem  Diabetes > 70/30 insulin outpatient 35 units twice daily.  Glucose greater than 300 in the ED. - Continue home 70/30 insulin at 25 units twice daily - SSI  CKD 3a > Creatinine near baseline at 1.77 (though has had some creatinines as low as 1.3, so possibly a degree of dehydration) - Trend renal function and electrolytes  GERD - Continue PPI  Substance use > States has been abstaining from substance use.  DVT prophylaxis: Treatment dose Lovenox Code Status:   Full Family Communication:  None on admission  Disposition Plan:   Patient is from:  Home  Anticipated DC to:  Home  Anticipated DC date:  1 to 2 days  Anticipated DC barriers: None  Consults called:  Cardiology consulted by EDP Admission status:  Observation, progressive  Severity of Illness: The appropriate patient status for this patient is OBSERVATION. Observation status is judged to be reasonable and necessary in order to provide the required intensity of service to ensure the patient's safety. The patient's presenting  symptoms, physical exam findings, and initial radiographic and laboratory data in the context of their medical condition is felt to place them at decreased risk for further clinical deterioration. Furthermore, it is anticipated that the patient will be medically stable for discharge from the hospital within 2 midnights of admission.    Synetta Fail MD [facility]  How to contact the [facility] Attending or Consulting provider 7A - 7P or covering provider during after hours 7P -7A, for this patient?   Check the care team in [facility] and look for a) attending/consulting [facility] provider listed and b) the [facility] team listed Log into [email or web address] and use [place]'s universal password to access. If you do not have the password, please contact the hospital operator. Locate the [facility] provider you are looking for under [facility] and page to a number that you can be directly reached. If you still have difficulty reaching the provider, please page the [facility] (Director on Call) for the Hospitalists listed on amion for assistance.  04/25/2023, 6:28 PM

## 2023-04-25 NOTE — ED Triage Notes (Signed)
Patient complains of left foot pain, reports felt a pop at work and now it keeps popping when he walks.  Upon triage vitals patient in ST 159 so EKG completed. Patient does reports short of breath with exertion.

## 2023-04-25 NOTE — ED Notes (Signed)
ED TO INPATIENT HANDOFF REPORT  ED Nurse Name and Phone #: Raquel Sarna [phone]  S Name/Age/Gender Ryan [name] 62 y.o. male Room/Bed: 016C/016C  Code Status   Code Status: Full Code  Home/SNF/Other Home Patient oriented to: self, place, time, and situation Is this baseline? Yes   Triage Complete: Triage complete  Chief Complaint Atrial flutter with rapid ventricular response [facility]) [I48.92]  Triage Note Patient complains of left foot pain, reports felt a pop at work and now it keeps popping when he walks.  Upon triage vitals patient in ST 159 so EKG completed. Patient does reports short of breath with exertion.    Allergies No Known Allergies  Level of Care/Admitting Diagnosis ED Disposition     ED Disposition  Admit   Condition  --   Comment  Hospital Area: [facility] [[id number]]  Level of Care: Progressive [102]  Admit to Progressive based on following criteria: CARDIOVASCULAR & THORACIC of moderate stability with acute coronary syndrome symptoms/low risk myocardial infarction/hypertensive urgency/arrhythmias/heart failure potentially compromising stability and stable post cardiovascular intervention patients.  May place patient in observation at [facility] or [facility] if equivalent level of care is available:: No  Covid Evaluation: Asymptomatic - no recent exposure (last 10 days) testing not required  Diagnosis: Atrial flutter with rapid ventricular response [facility]) [[id number]]  Admitting Physician: Synetta Fail [[id number]]  Attending Physician: Synetta Fail [[id number]]          B Medical/Surgery History Past Medical History:  Diagnosis Date   Achilles tendon rupture right   Arthritis    back and shoulders   Atrial flutter with rapid ventricular response (HCC) 07/22/2020   Chest pain    a. 05/2011 - admit w/ c/p in setting of cocaine-->Cath:  nl cors, nl EF, no vasospasm noted.   CKD (chronic kidney disease), stage III  (HCC)    COVID-19 virus infection 05/31/2019   Diabetes mellitus without complication (HCC)    Drug abuse (HCC)    cocaine, marijuana abuse   GERD (gastroesophageal reflux disease)    History of echocardiogram    a. 07/2020 Echo: EF 65-70%, no rwma, mild conc LVH, nl RV fxn, mildly dil LA.   Hypertension    Past Surgical History:  Procedure Laterality Date   ACHILLES TENDON SURGERY Right 05/02/2014   Procedure: RIGHT ACHILLES TENDON REPAIR;  Surgeon: Javier Docker, MD;  Location: [facility] ORS;  Service: Orthopedics;  Laterality: Right;   BIOPSY  07/25/2020   Procedure: BIOPSY;  Surgeon: Kathi Der, MD;  Location: [facility] ENDOSCOPY;  Service: Gastroenterology;;   CARDIAC CATHETERIZATION  05.31.2012   showing patent coronaries with no visualized vasospasm and EF of 55-60 %   ESOPHAGOGASTRODUODENOSCOPY N/A 07/25/2020   Procedure: ESOPHAGOGASTRODUODENOSCOPY (EGD);  Surgeon: Kathi Der, MD;  Location: [facility] ENDOSCOPY;  Service: Gastroenterology;  Laterality: N/A;   ORBITAL FRACTURE SURGERY Left 2-3 yrs ago     A IV Location/Drains/Wounds Patient Lines/Drains/Airways Status     Active Line/Drains/Airways     Name Placement date Placement time Site Days   Peripheral IV 04/25/23 20 G Anterior;Left;Proximal Forearm 04/25/23  1534  Forearm  less than 1            Intake/Output Last 24 hours  Intake/Output Summary (Last 24 hours) at 04/25/2023 1912 Last data filed at 04/25/2023 1708 Gross per 24 hour  Intake 1000 ml  Output --  Net 1000 ml    Labs/Imaging Results for orders placed or performed during the hospital  encounter of 04/25/23 (from the past 48 hour(s))  Comprehensive metabolic panel     Status: Abnormal   Collection Time: 04/25/23  3:55 PM  Result Value Ref Range   Sodium 132 (L) 135 - 145 mmol/L   Potassium 3.9 3.5 - 5.1 mmol/L   Chloride 100 98 - 111 mmol/L   CO2 16 (L) 22 - 32 mmol/L   Glucose, Bld 393 (H) 70 - 99 mg/dL    Comment: Glucose reference range  applies only to samples taken after fasting for at least 8 hours.   BUN 31 (H) 6 - 20 mg/dL   Creatinine, Ser 9.14 (H) 0.61 - 1.24 mg/dL   Calcium 8.8 (L) 8.9 - 10.3 mg/dL   Total Protein 6.9 6.5 - 8.1 g/dL   Albumin 3.5 3.5 - 5.0 g/dL   AST 18 15 - 41 U/L   ALT 17 0 - 44 U/L   Alkaline Phosphatase 75 38 - 126 U/L   Total Bilirubin 0.2 (L) 0.3 - 1.2 mg/dL   GFR, Estimated 44 (L) >60 mL/min    Comment: (NOTE) Calculated using the CKD-EPI Creatinine Equation (2021)    Anion gap 16 (H) 5 - 15    Comment: Performed at [facility], [address] [place], [place] [address]  CBC with Differential     Status: Abnormal   Collection Time: 04/25/23  3:55 PM  Result Value Ref Range   WBC 10.1 4.0 - 10.5 K/uL   RBC 4.66 4.22 - 5.81 MIL/uL   Hemoglobin 12.5 (L) 13.0 - 17.0 g/dL   HCT 62.1 30.8 - 65.7 %   MCV 84.1 80.0 - 100.0 fL   MCH 26.8 26.0 - 34.0 pg   MCHC 31.9 30.0 - 36.0 g/dL   RDW 84.6 (H) 96.2 - 95.2 %   Platelets 299 150 - 400 K/uL   nRBC 0.0 0.0 - 0.2 %   Neutrophils Relative % 69 %   Neutro Abs 6.9 1.7 - 7.7 K/uL   Lymphocytes Relative 23 %   Lymphs Abs 2.4 0.7 - 4.0 K/uL   Monocytes Relative 8 %   Monocytes Absolute 0.8 0.1 - 1.0 K/uL   Eosinophils Relative 0 %   Eosinophils Absolute 0.0 0.0 - 0.5 K/uL   Basophils Relative 0 %   Basophils Absolute 0.0 0.0 - 0.1 K/uL   Immature Granulocytes 0 %   Abs Immature Granulocytes 0.03 0.00 - 0.07 K/uL    Comment: Performed at [facility], [address] [place], [place] [address]  Troponin I (High Sensitivity)     Status: None   Collection Time: 04/25/23  3:55 PM  Result Value Ref Range   Troponin I (High Sensitivity) 15 <18 ng/L    Comment: (NOTE) Elevated high sensitivity troponin I (hsTnI) values and significant  changes across serial measurements may suggest ACS but many other  chronic and acute conditions are known to elevate hsTnI results.  Refer to the "Links" section for chest pain algorithms and  additional  guidance. Performed at [facility], [address], [place], [place] [address]   Brain natriuretic peptide     Status: Abnormal   Collection Time: 04/25/23  3:56 PM  Result Value Ref Range   B Natriuretic Peptide 782.3 (H) 0.0 - 100.0 pg/mL    Comment: Performed at [facility], [address], [place], [place] [address]  Troponin I (High Sensitivity)     Status: None   Collection Time:  04/25/23  5:36 PM  Result Value Ref Range   Troponin I (High Sensitivity) 14 <18 ng/L    Comment: (NOTE) Elevated high sensitivity troponin I (hsTnI) values and significant  changes across serial measurements may suggest ACS but many other  chronic and acute conditions are known to elevate hsTnI results.  Refer to the "Links" section for chest pain algorithms and additional  guidance. Performed at [facility], [address], [place], [place] [address]    DG Foot Complete Left  Result Date: 04/25/2023 CLINICAL DATA:  Fall, pain and popping sensation EXAM: LEFT FOOT - COMPLETE 3+ VIEW COMPARISON:  04/25/2023 FINDINGS: There is no evidence of fracture or dislocation. There is no evidence of significant arthropathy or other focal bone abnormality. Plantar calcaneal spur noted as well as enthesopathic change at the Achilles insertion posteriorly. Soft tissues are unremarkable. IMPRESSION: No acute finding by plain radiography. Chronic findings as above. Electronically Signed   By: Judie Petit.  Shick M.D.   On: 04/25/2023 15:58   DG Chest Portable 1 View  Result Date: 04/25/2023 CLINICAL DATA:  Shortness of breath with exertion EXAM: PORTABLE CHEST 1 VIEW COMPARISON:  02/01/2023 FINDINGS: Persistent low lung volumes without focal pneumonia, edema, effusion or pneumothorax. Trachea midline. Normal heart size and vascularity. No acute osseous finding. Telemetry leads overlie the chest. Overall stable exam. IMPRESSION: Low volume exam without acute process. Electronically Signed   By: Judie Petit.   Shick M.D.   On: 04/25/2023 15:56   DG Ankle Complete Left  Result Date: 04/25/2023 CLINICAL DATA:  Pain, popping sensation EXAM: LEFT ANKLE COMPLETE - 3+ VIEW COMPARISON:  04/25/2023 FINDINGS: There is no evidence of fracture, dislocation, or joint effusion. There is no evidence of significant arthropathy or other focal bone abnormality. Small plantar calcaneal spur. Enthesopathic change of the calcaneus posteriorly at the Achilles insertion. Soft tissues are unremarkable. IMPRESSION: No acute finding by plain radiography. Chronic findings as above. Electronically Signed   By: Judie Petit.  Shick M.D.   On: 04/25/2023 15:55    Pending Labs Unresulted Labs (From admission, onward)     Start     Ordered   04/26/23 0500  Comprehensive metabolic panel  Tomorrow morning,   R        04/25/23 1827   04/26/23 0500  CBC  Tomorrow morning,   R        04/25/23 1827   04/25/23 1858  Hemoglobin A1c  Once,   R       Comments: To assess prior glycemic control    04/25/23 1858   04/25/23 1824  HIV Antibody (routine testing w rflx)  (HIV Antibody (Routine testing w reflex) panel)  Once,   R        04/25/23 1827            Vitals/Pain Today's Vitals   04/25/23 1824 04/25/23 1830 04/25/23 1845 04/25/23 1855  BP:  (!) 132/106 (!) 134/103 (!) 135/96  Pulse:  (!) 126 (!) 126 (!) 126  Resp:  (!) 28 20 (!) 28  Temp:      TempSrc:      SpO2:  96% 97% 95%  Weight:      Height:      PainSc: 8        Isolation Precautions No active isolations  Medications Medications  diltiazem (CARDIZEM) 1 mg/mL load via infusion 10 mg (10 mg Intravenous Bolus from Bag 04/25/23 1709)    And  diltiazem (CARDIZEM) 125 mg in dextrose 5%  125 mL (1 mg/mL) infusion (15 mg/hr Intravenous Rate/Dose Change 04/25/23 1829)  diltiazem (CARDIZEM CD) 24 hr capsule 240 mg (has no administration in time range)  losartan (COZAAR) tablet 50 mg (has no administration in time range)  carvedilol (COREG) tablet 3.125 mg (has no  administration in time range)  pantoprazole (PROTONIX) EC tablet 40 mg (has no administration in time range)  sodium chloride flush (NS) 0.9 % injection 3 mL (has no administration in time range)  acetaminophen (TYLENOL) tablet 650 mg (has no administration in time range)    Or  acetaminophen (TYLENOL) suppository 650 mg (has no administration in time range)  polyethylene glycol (MIRALAX / GLYCOLAX) packet 17 g (has no administration in time range)  insulin aspart (novoLOG) injection 0-15 Units (has no administration in time range)  insulin aspart (novoLOG) injection 0-5 Units (has no administration in time range)  insulin aspart protamine- aspart (NOVOLOG MIX 70/30) injection 25 Units (has no administration in time range)  lactated ringers bolus 1,000 mL (0 mLs Intravenous Stopped 04/25/23 1708)  diltiazem (CARDIZEM) injection 10 mg (10 mg Intravenous Given 04/25/23 1604)  fentaNYL (SUBLIMAZE) injection 50 mcg (50 mcg Intravenous Given 04/25/23 1605)  HYDROcodone-acetaminophen (NORCO/VICODIN) 5-325 MG per tablet 1 tablet (1 tablet Oral Given 04/25/23 1824)    Mobility walks with person assist     Focused Assessments Cardiac Assessment Handoff:  Cardiac Rhythm: Supraventricular tachycardia Lab Results  Component Value Date   CKTOTAL 181 06/01/2019   CKMB 3.0 05/13/2011   TROPONINI <0.03 06/01/2019   Lab Results  Component Value Date   DDIMER 1.35 (H) 06/01/2019   Does the Patient currently have chest pain? No    R Recommendations: See Admitting Provider Note  Report given to:   Additional Notes: Pt a/o x 4 ambulates w/ assistance x 2

## 2023-04-25 NOTE — Progress Notes (Signed)
ANTICOAGULATION CONSULT NOTE   Pharmacy Consult for enoxaparin Indication: atrial flutter  No Known Allergies  Patient Measurements: Height: 5\' 9"  (175.3 cm) Weight: 109.8 kg (242 lb) IBW/kg (Calculated) : 70.7  Vital Signs: Temp: 98 F (36.7 C) (05/13 1712) Temp Source: Oral (05/13 1712) BP: 135/96 (05/13 1855) Pulse Rate: 126 (05/13 1855)  Labs: Recent Labs    04/25/23 1555 04/25/23 1736  HGB 12.5*  --   HCT 39.2  --   PLT 299  --   CREATININE 1.77*  --   TROPONINIHS 15 14    Estimated Creatinine Clearance: 54.9 mL/min (A) (by C-G formula based on SCr of 1.77 mg/dL (H)).   Medical History: Past Medical History:  Diagnosis Date   Achilles tendon rupture right   Arthritis    back and shoulders   Atrial flutter with rapid ventricular response (HCC) 07/22/2020   Chest pain    a. 05/2011 - admit w/ c/p in setting of cocaine-->Cath:  nl cors, nl EF, no vasospasm noted.   CKD (chronic kidney disease), stage III (HCC)    COVID-19 virus infection 05/31/2019   Diabetes mellitus without complication (HCC)    Drug abuse (HCC)    cocaine, marijuana abuse   GERD (gastroesophageal reflux disease)    History of echocardiogram    a. 07/2020 Echo: EF 65-70%, no rwma, mild conc LVH, nl RV fxn, mildly dil LA.   Hypertension     Medications:  (Not in a hospital admission)  Scheduled:   carvedilol  3.125 mg Oral BID   [START ON 04/26/2023] diltiazem  240 mg Oral Daily   insulin aspart  0-15 Units Subcutaneous TID WC   insulin aspart  0-5 Units Subcutaneous QHS   insulin aspart protamine- aspart  25 Units Subcutaneous BID WC   [START ON 04/26/2023] losartan  50 mg Oral Daily   [START ON 04/26/2023] pantoprazole  40 mg Oral Daily   sodium chloride flush  3 mL Intravenous Q12H   Infusions:   diltiazem (CARDIZEM) infusion 15 mg/hr (04/25/23 1829)    Assessment: Patient presented with AFL with RVR. Reports he has bene out of diltiazem for 1 month and anticoagulation for 3  months due to finances. Rate currently 145 bpm. Patient reports symptoms started >2 days ago. Pharmacy consulted to dose enoxaparin. CrCl is 55 ml/min. CBC near baseline.   Plan:  Start enoxaparin`120 mg BID (1 mg/kg rounded up to nearest syringe size) Monitor daily CBC F/u transition back to oral anticoagulation. Will need cost evaluation on AM shift  Thank you for involving pharmacy in this patient's care.  Enos Fling, PharmD PGY2 Pharmacy Resident 04/25/2023 7:36 PM

## 2023-04-25 NOTE — ED Notes (Signed)
O2 at 2LPM [place] initiated for comfort. Pt remains alert and oriented x 4. Denies using drugs or alcohol. Reports he has been off of his cardizem for 1 month and off of his blood thinners for a few months now due to financial issues. Cardiac monitoring in place. After cardizem IVP, pt HR decreased to 125-135 bpm from 156-159 bpm. Will continue to monitor.

## 2023-04-26 ENCOUNTER — Observation Stay (HOSPITAL_BASED_OUTPATIENT_CLINIC_OR_DEPARTMENT_OTHER): Payer: 59

## 2023-04-26 ENCOUNTER — Telehealth (HOSPITAL_COMMUNITY): Payer: Self-pay | Admitting: Pharmacy Technician

## 2023-04-26 ENCOUNTER — Other Ambulatory Visit (HOSPITAL_COMMUNITY): Payer: Self-pay

## 2023-04-26 DIAGNOSIS — N1832 Chronic kidney disease, stage 3b: Secondary | ICD-10-CM | POA: Diagnosis not present

## 2023-04-26 DIAGNOSIS — I5031 Acute diastolic (congestive) heart failure: Secondary | ICD-10-CM | POA: Diagnosis not present

## 2023-04-26 DIAGNOSIS — I4892 Unspecified atrial flutter: Secondary | ICD-10-CM | POA: Diagnosis not present

## 2023-04-26 DIAGNOSIS — I483 Typical atrial flutter: Secondary | ICD-10-CM | POA: Diagnosis not present

## 2023-04-26 LAB — GLUCOSE, CAPILLARY
Glucose-Capillary: 171 mg/dL — ABNORMAL HIGH (ref 70–99)
Glucose-Capillary: 296 mg/dL — ABNORMAL HIGH (ref 70–99)
Glucose-Capillary: 234 mg/dL — ABNORMAL HIGH (ref 70–99)

## 2023-04-26 LAB — CBC
HCT: 36.7 % — ABNORMAL LOW (ref 39.0–52.0)
Hemoglobin: 12.1 g/dL — ABNORMAL LOW (ref 13.0–17.0)
MCH: 26.8 pg (ref 26.0–34.0)
MCHC: 33 g/dL (ref 30.0–36.0)
MCV: 81.4 fL (ref 80.0–100.0)
Platelets: 263 10*3/uL (ref 150–400)
RBC: 4.51 MIL/uL (ref 4.22–5.81)
RDW: 16.8 % — ABNORMAL HIGH (ref 11.5–15.5)
WBC: 9.4 10*3/uL (ref 4.0–10.5)
nRBC: 0 % (ref 0.0–0.2)

## 2023-04-26 LAB — ECHOCARDIOGRAM COMPLETE
Area-P 1/2: 4.31 cm2
Height: 69 in
S' Lateral: 2.7 cm
Weight: 4119.96 oz

## 2023-04-26 LAB — COMPREHENSIVE METABOLIC PANEL
ALT: 16 U/L (ref 0–44)
AST: 17 U/L (ref 15–41)
Albumin: 3 g/dL — ABNORMAL LOW (ref 3.5–5.0)
Alkaline Phosphatase: 65 U/L (ref 38–126)
Anion gap: 12 (ref 5–15)
BUN: 25 mg/dL — ABNORMAL HIGH (ref 6–20)
CO2: 20 mmol/L — ABNORMAL LOW (ref 22–32)
Calcium: 8.4 mg/dL — ABNORMAL LOW (ref 8.9–10.3)
Chloride: 101 mmol/L (ref 98–111)
Creatinine, Ser: 1.69 mg/dL — ABNORMAL HIGH (ref 0.61–1.24)
GFR, Estimated: 46 mL/min — ABNORMAL LOW (ref >60)
Glucose, Bld: 221 mg/dL — ABNORMAL HIGH (ref 70–99)
Potassium: 3.1 mmol/L — ABNORMAL LOW (ref 3.5–5.1)
Sodium: 133 mmol/L — ABNORMAL LOW (ref 135–145)
Total Bilirubin: 0.5 mg/dL (ref 0.3–1.2)
Total Protein: 6.3 g/dL — ABNORMAL LOW (ref 6.5–8.1)

## 2023-04-26 LAB — POTASSIUM: Potassium: 4.3 mmol/L (ref 3.5–5.1)

## 2023-04-26 LAB — CK: Total CK: 122 U/L (ref 49–397)

## 2023-04-26 MED ORDER — OXYCODONE HCL 5 MG PO TABS
5.0000 mg | ORAL_TABLET | Freq: Once | ORAL | Status: AC
  Administered 2023-04-26: 5 mg via ORAL
  Filled 2023-04-26: qty 1

## 2023-04-26 MED ORDER — POTASSIUM CHLORIDE CRYS ER 20 MEQ PO TBCR
40.0000 meq | EXTENDED_RELEASE_TABLET | Freq: Two times a day (BID) | ORAL | Status: DC
  Administered 2023-04-26: 40 meq via ORAL

## 2023-04-26 MED ORDER — POTASSIUM CHLORIDE CRYS ER 20 MEQ PO TBCR
40.0000 meq | EXTENDED_RELEASE_TABLET | ORAL | Status: AC
  Administered 2023-04-26: 40 meq via ORAL
  Filled 2023-04-26: qty 2

## 2023-04-26 MED ORDER — INSULIN ASPART PROT & ASPART (70-30 MIX) 100 UNIT/ML ~~LOC~~ SUSP
25.0000 [IU] | Freq: Two times a day (BID) | SUBCUTANEOUS | Status: DC
  Administered 2023-04-26 (×2): 25 [IU] via SUBCUTANEOUS
  Filled 2023-04-26: qty 10

## 2023-04-26 MED ORDER — APIXABAN 5 MG PO TABS
5.0000 mg | ORAL_TABLET | Freq: Two times a day (BID) | ORAL | 0 refills | Status: DC
  Filled 2023-04-26: qty 60, 30d supply, fill #0

## 2023-04-26 MED ORDER — DILTIAZEM HCL ER COATED BEADS 240 MG PO CP24
240.0000 mg | ORAL_CAPSULE | Freq: Every day | ORAL | 0 refills | Status: DC
  Filled 2023-04-26: qty 30, 30d supply, fill #0

## 2023-04-26 MED ORDER — LOSARTAN POTASSIUM 50 MG PO TABS
50.0000 mg | ORAL_TABLET | Freq: Every day | ORAL | 0 refills | Status: AC
  Filled 2023-04-26: qty 30, 30d supply, fill #0

## 2023-04-26 MED ORDER — CARVEDILOL 3.125 MG PO TABS
3.1250 mg | ORAL_TABLET | Freq: Two times a day (BID) | ORAL | 0 refills | Status: DC
  Filled 2023-04-26: qty 60, 30d supply, fill #0

## 2023-04-26 MED ORDER — POTASSIUM CHLORIDE CRYS ER 20 MEQ PO TBCR: 40.0000 meq | EXTENDED_RELEASE_TABLET | Freq: Once | ORAL | Status: DC

## 2023-04-26 NOTE — Progress Notes (Signed)
Rounding Note    Patient Name: Ryan [name] Date of Encounter: 04/26/2023  [facility] Cardiologist: Chrystie Nose, MD   Subjective   Converted to normal sinus rhythm around 2 AM and has maintained normal rhythm since then. His wife was at the bedside this morning and confirms that he is a very loud snorer and she has witnessed apneic events.  Inpatient Medications    Scheduled Meds:  diltiazem  240 mg Oral Daily   enoxaparin (LOVENOX) injection  120 mg Subcutaneous Q12H   insulin aspart  0-15 Units Subcutaneous TID WC   insulin aspart  0-5 Units Subcutaneous QHS   insulin aspart protamine- aspart  25 Units Subcutaneous BID WC   pantoprazole  40 mg Oral Daily   sodium chloride flush  3 mL Intravenous Q12H   Continuous Infusions:  sodium chloride     diltiazem (CARDIZEM) infusion Stopped (04/26/23 1200)   PRN Meds: acetaminophen **OR** acetaminophen, polyethylene glycol   Vital Signs    Vitals:   04/26/23 0032 04/26/23 0400 04/26/23 0708 04/26/23 0800  BP: (!) 143/85 117/72 111/71   Pulse: 79 77 70   Resp: 17 18 18    Temp: 98 F (36.7 C) 98.1 F (36.7 C) 97.8 F (36.6 C) 97.7 F (36.5 C)  TempSrc: Oral Oral Oral   SpO2: 98% 99% 96%   Weight:  116.8 kg    Height:        Intake/Output Summary (Last 24 hours) at 04/26/2023 0857 Last data filed at 04/25/2023 1708 Gross per 24 hour  Intake 1000 ml  Output --  Net 1000 ml      04/26/2023    4:00 AM 04/25/2023    3:27 PM 02/09/2023   10:01 AM  Last 3 Weights  Weight (lbs) 257 lb 8 oz 242 lb 242 lb  Weight (kg) 116.8 kg 109.77 kg 109.77 kg      Telemetry    Normal sinus rhythm- Personally Reviewed  ECG    No new tracing- Personally Reviewed  Physical Exam  Severely obese GEN: No acute distress.   Neck: No JVD Cardiac: RRR, no murmurs, rubs, or gallops.  Respiratory: Clear to auscultation bilaterally. GI: Soft, nontender, non-distended  MS: No edema; No deformity. Neuro:   Nonfocal  Psych: Normal affect   Labs    High Sensitivity Troponin:   Recent Labs  Lab 04/25/23 1555 04/25/23 1736  TROPONINIHS 15 14     Chemistry Recent Labs  Lab 04/25/23 1555 04/26/23 0116  NA 132* 133*  K 3.9 3.1*  CL 100 101  CO2 16* 20*  GLUCOSE 393* 221*  BUN 31* 25*  CREATININE 1.77* 1.69*  CALCIUM 8.8* 8.4*  PROT 6.9 6.3*  ALBUMIN 3.5 3.0*  AST 18 17  ALT 17 16  ALKPHOS 75 65  BILITOT 0.2* 0.5  GFRNONAA 44* 46*  ANIONGAP 16* 12    Lipids No results for input(s): "CHOL", "TRIG", "HDL", "LABVLDL", "LDLCALC", "CHOLHDL" in the last 168 hours.  Hematology Recent Labs  Lab 04/25/23 1555 04/26/23 0116  WBC 10.1 9.4  RBC 4.66 4.51  HGB 12.5* 12.1*  HCT 39.2 36.7*  MCV 84.1 81.4  MCH 26.8 26.8  MCHC 31.9 33.0  RDW 17.0* 16.8*  PLT 299 263   Thyroid No results for input(s): "TSH", "FREET4" in the last 168 hours.  BNP Recent Labs  Lab 04/25/23 1556  BNP 782.3*    DDimer No results for input(s): "DDIMER" in the last 168 hours.  Radiology    DG Foot Complete Left  Result Date: 04/25/2023 CLINICAL DATA:  Fall, pain and popping sensation EXAM: LEFT FOOT - COMPLETE 3+ VIEW COMPARISON:  04/25/2023 FINDINGS: There is no evidence of fracture or dislocation. There is no evidence of significant arthropathy or other focal bone abnormality. Plantar calcaneal spur noted as well as enthesopathic change at the Achilles insertion posteriorly. Soft tissues are unremarkable. IMPRESSION: No acute finding by plain radiography. Chronic findings as above. Electronically Signed   By: Judie Petit.  Shick M.D.   On: 04/25/2023 15:58   DG Chest Portable 1 View  Result Date: 04/25/2023 CLINICAL DATA:  Shortness of breath with exertion EXAM: PORTABLE CHEST 1 VIEW COMPARISON:  02/01/2023 FINDINGS: Persistent low lung volumes without focal pneumonia, edema, effusion or pneumothorax. Trachea midline. Normal heart size and vascularity. No acute osseous finding. Telemetry leads overlie the  chest. Overall stable exam. IMPRESSION: Low volume exam without acute process. Electronically Signed   By: Judie Petit.  Shick M.D.   On: 04/25/2023 15:56   DG Ankle Complete Left  Result Date: 04/25/2023 CLINICAL DATA:  Pain, popping sensation EXAM: LEFT ANKLE COMPLETE - 3+ VIEW COMPARISON:  04/25/2023 FINDINGS: There is no evidence of fracture, dislocation, or joint effusion. There is no evidence of significant arthropathy or other focal bone abnormality. Small plantar calcaneal spur. Enthesopathic change of the calcaneus posteriorly at the Achilles insertion. Soft tissues are unremarkable. IMPRESSION: No acute finding by plain radiography. Chronic findings as above. Electronically Signed   By: Judie Petit.  Shick M.D.   On: 04/25/2023 15:55    Cardiac Studies   Canceled TEE/cardioversion.  Has a transthoracic echocardiogram scheduled for later this morning.  Patient Profile     62 y.o. male with severe obesity, suspected obstructive sleep apnea, diabetes mellitus type 2, hypertension, admitted with recurrent atrial flutter with rapid ventricular response, spontaneously converted to sinus rhythm while on intravenous diltiazem.  Assessment & Plan    Recommend anticoagulation with Eliquis 5 mg twice daily.  (CHA2DS2-VASc score is 2 for diabetes mellitus and hypertension) Stop intravenous diltiazem, have resumed oral diltiazem 240 mg daily. Outpatient EP consultation to discuss possible radiofrequency cavotricuspid isthmus ablation. Outpatient home sleep study. Echo not yet done, will likely be completed in the next couple of hours.  KCl supplement for hypokalemia (suspect due to hyperglycemia/polyuria).  Continues to have foot pain.  Normal uric acid makes gout less likely.  No obvious trauma and normal x-rays.  Would avoid NSAIDs due to the need for anticoagulation and the presence of significant chronic kidney disease.  Could consider short course of steroids, but this would likely lead to worsening  hyperglycemia.  Might need to treat symptomatically with analgesics and maybe topical Voltaren.  [facility] will sign off.   Medication Recommendations: Eliquis 5 mg twice daily, sustained-release diltiazem 240 mg daily, carvedilol 3.125 mg twice daily, losartan 50 mg daily. Other recommendations (labs, testing, etc): per Primary team.  We will schedule home sleep study. Follow up as an outpatient: Will make arrangements for outpatient EP consultation.  For questions or updates, please contact [facility] Please consult [email or web address] for contact info under        Signed, Thurmon Fair, MD  04/26/2023, 8:57 AM

## 2023-04-26 NOTE — Progress Notes (Signed)
OT Cancellation Note  Patient Details Name: Ryan [name] MRN: [id number] DOB: 10/25/1961   Cancelled Treatment:    Reason Eval/Treat Not Completed: OT screened, no needs identified, will sign off (Per PT pt does not have OT needs, will sign off. Thank you.)  Donia Pounds 04/26/2023, 10:49 AM

## 2023-04-26 NOTE — Care Plan (Signed)
Cardizem PO charted not given due to sending Pharmacist Larose Hires [facility] a message about missing dose. John responded Please follow up with floor pharmacist "This order was not verified due to discrepancy on taking/not taking at home. "  Nicolette Bang sent direct message" Was looking for you on the floor, but you must be in a patient room. Dr C. would like Diltiazem CD to be given this morning. I just spoke with him in the dictation room, and he told me that the drip is stopping/has stopped. The order was unverified when you charted not given, but is now verified. Please let me know if I need to add another administration time, or whether you can edit what you had already charted."   RN then sent Jonny Ruiz a followup message about missing dose and floor pharmacist newly verified medication. No medication available at this time

## 2023-04-26 NOTE — Progress Notes (Signed)
  Echocardiogram 2D Echocardiogram has been performed.  Delcie Roch 04/26/2023, 2:58 PM

## 2023-04-26 NOTE — Telephone Encounter (Signed)
Pharmacy Patient Advocate Encounter  Insurance verification completed.    The patient is insured through [employer]   The patient is currently admitted and ran test claims for the following: Eliquis.  Copays and coinsurance results were relayed to Inpatient clinical team.      

## 2023-04-26 NOTE — Progress Notes (Signed)
  Transition of Care [facility]) Screening Note   Patient Details  Name: Buz Dorgan Date of Birth: 1961-03-27   Transition of Care [facility]) CM/SW Contact:    Darrold Span, RN Phone Number: 04/26/2023, 3:18 PM    Transition of Care Department [facility]) has reviewed patient and note pt declining any DME at this time. PCP follow up appointment has been made with IM clinic. PharmD to see pt for Eliquis needs. We will continue to monitor patient advancement through interdisciplinary progression rounds. If new patient transition needs arise, please place a TOC consult.

## 2023-04-26 NOTE — TOC Benefit Eligibility Note (Signed)
Patient Product/process development scientist completed.    The patient is currently admitted and upon discharge could be taking Eliquis 5 mg.  The current 30 day co-pay is $567.26 due to a $4500 deductible.   The patient is insured through [employer]   This test claim was processed through [employer]- copay amounts may vary at other pharmacies due to [employer], or as the patient moves through the different stages of their insurance plan.  Roland Earl, CPHT Pharmacy Patient Advocate Specialist [facility] Pharm[facility] Patient Advocate Team Direct Number: [phone]  Fax: [phone]

## 2023-04-26 NOTE — Evaluation (Signed)
Physical Therapy Evaluation Patient Details Name: Ryan [name] MRN: [id number] DOB: 08/13/1961 Today's Date: 04/26/2023  History of Present Illness  61 y.o. male presents to [facility] hospital on 04/25/2023 with L foot pain. Pt also found to be in a flutter with RVR, rates into 150s. PMH includes achilles tendon rupture, OA, a flutter with RVR, durg abuse, GERD, HTN.  Clinical Impression  Pt presents to PT with dorsal L foot pain. Pain is present at rest and with AROM of L foot/ankle. Pt declines PT examination of R foot for comparison of feet. Pt is able to transfer and ambulate without physical assistance, requiring increased time due to pain. Pt refuses use of DME that is offered by PT. Based on location of pt's pain and lack of fracture on imaging it appears pt has a soft tissue injury. PT recommends rest, ice, elevation of L foot to aide in reducing inflammation and pain. Pt declines use of ice or elevation at this time. PT recommends discharge home when medically stable.       Recommendations for follow up therapy are one component of a multi-disciplinary discharge planning process, led by the attending physician.  Recommendations may be updated based on patient status, additional functional criteria and insurance authorization.  Follow Up Recommendations       Assistance Recommended at Discharge Intermittent Supervision/Assistance  Patient can return home with the following  Assist for transportation;Help with stairs or ramp for entrance    Equipment Recommendations  (pt declining possible DME to reduce WB load on L foot)  Recommendations for Other Services       Functional Status Assessment Patient has had a recent decline in their functional status and demonstrates the ability to make significant improvements in function in a reasonable and predictable amount of time.     Precautions / Restrictions Precautions Precautions: Fall Precaution Comments: L dorsal foot  pain Required Braces or Orthoses:  (L CAM boot ordered, not present upon eval) Restrictions Weight Bearing Restrictions: No      Mobility  Bed Mobility Overal bed mobility: Modified Independent                  Transfers Overall transfer level: Needs assistance Equipment used: None Transfers: Sit to/from Stand Sit to Stand: Supervision                Ambulation/Gait Ambulation/Gait assistance: Supervision Gait Distance (Feet): 70 Feet Assistive device: None (pt declines use of DME) Gait Pattern/deviations: Step-to pattern Gait velocity: reduced Gait velocity interpretation: <1.31 ft/sec, indicative of household ambulator   General Gait Details: pt with slowed step-to gait, reduced stance time on LLE  Stairs            Wheelchair Mobility    Modified Rankin (Stroke Patients Only)       Balance Overall balance assessment: Needs assistance Sitting-balance support: No upper extremity supported, Feet supported Sitting balance-Leahy Scale: Normal     Standing balance support: No upper extremity supported, During functional activity Standing balance-Leahy Scale: Fair                               Pertinent Vitals/Pain Pain Assessment Pain Assessment: 0-10 Pain Score: 9  Pain Location: dorsum of L forefoot Pain Descriptors / Indicators: Aching Pain Intervention(s): Limited activity within patient's tolerance    Home Living Family/patient expects to be discharged to:: Private residence Living Arrangements: Other relatives (sister) Available Help at Discharge:  Family;Available 24 hours/day Type of Home: Apartment Home Access: Level entry       Home Layout: One level Home Equipment: None      Prior Function Prior Level of Function : Independent/Modified Independent (does not drive)             Mobility Comments: ambulatory without DME       Hand Dominance        Extremity/Trunk Assessment   Upper Extremity  Assessment Upper Extremity Assessment: Overall WFL for tasks assessed    Lower Extremity Assessment Lower Extremity Assessment: LLE deficits/detail LLE Deficits / Details: pain in L forefoot at rest and with active DF    Cervical / Trunk Assessment Cervical / Trunk Assessment: Normal  Communication   Communication: No difficulties  Cognition Arousal/Alertness: Awake/alert Behavior During Therapy: WFL for tasks assessed/performed Overall Cognitive Status: Within Functional Limits for tasks assessed                                          General Comments General comments (skin integrity, edema, etc.): VSS on RA    Exercises     Assessment/Plan    PT Assessment Patient needs continued PT services  PT Problem List Decreased strength;Decreased activity tolerance;Decreased balance;Decreased mobility;Decreased knowledge of use of DME;Pain       PT Treatment Interventions DME instruction;Gait training;Functional mobility training;Therapeutic activities;Therapeutic exercise;Balance training;Patient/family education    PT Goals (Current goals can be found in the Care Plan section)  Acute Rehab PT Goals Patient Stated Goal: to reduce pain in L foot PT Goal Formulation: With patient Time For Goal Achievement: 05/10/23 Potential to Achieve Goals: Good    Frequency Min 2X/week     Co-evaluation               AM-PAC PT "6 Clicks" Mobility  Outcome Measure Help needed turning from your back to your side while in a flat bed without using bedrails?: None Help needed moving from lying on your back to sitting on the side of a flat bed without using bedrails?: None Help needed moving to and from a bed to a chair (including a wheelchair)?: A Little Help needed standing up from a chair using your arms (e.g., wheelchair or bedside chair)?: A Little Help needed to walk in hospital room?: A Little Help needed climbing 3-5 steps with a railing? : A Little 6 Click  Score: 20    End of Session   Activity Tolerance: Patient limited by pain Patient left: in bed;with call bell/phone within reach;with family/visitor present Nurse Communication: Mobility status PT Visit Diagnosis: Other abnormalities of gait and mobility (R26.89);Pain Pain - Right/Left: Left Pain - part of body: Ankle and joints of foot    Time: 1015-1029 PT Time Calculation (min) (ACUTE ONLY): 14 min   Charges:   PT Evaluation $PT Eval Low Complexity: 1 Low          Arlyss Gandy, PT, DPT Acute Rehabilitation Office [phone]   Arlyss Gandy 04/26/2023, 10:43 AM

## 2023-04-26 NOTE — Discharge Summary (Signed)
Discharge Summary  Ryan [name] ZOX:[id number] DOB: 08/29/1961  PCP: Lavinia Sharps, NP  Admit date: 04/25/2023 Discharge date: 04/26/2023  Time spent: 35 minutes   Recommendations for Outpatient Follow-up:  Follow-up with cardiology within a week. Take your medications as prescribed.  Discharge Diagnoses:  Active Hospital Problems   Diagnosis Date Noted   Atrial flutter with rapid ventricular response (HCC) 04/25/2023   Atrial flutter (HCC) 07/22/2020   Cocaine abuse (HCC)    Diabetes mellitus type 2, insulin dependent (HCC) 05/31/2019   CKD (chronic kidney disease), stage III (HCC)    Uncontrolled hypertension 05/14/2014    Resolved Hospital Problems  No resolved problems to display.    Discharge Condition: Stable  Diet recommendation: Resume previous diet  Vitals:   04/26/23 1103 04/26/23 1636  BP: (!) 118/46 128/71  Pulse: 79 78  Resp: 18 18  Temp: 97.8 F (36.6 C) 98.2 F (36.8 C)  SpO2: 95%     History of present illness:  Ryan [name] is a 62 y.o. male with medical history significant of type II diabetes, hypertension, atrial flutter, CKD 3A, GERD, substance use presenting with left foot pain.  Endorses that his left foot snapped twice spontaneously as he was walking.  Denies spasms involving his left foot.  Denies history of gout.  No swelling on exam.  X-ray with no findings of fracture.  He was incidentally noted to be in A-fib with RVR for which Cardizem drip was started.  Seen by cardiology.  Converted back to sinus rhythm.  Started on Eliquis for primary CVA prevention.  A 2D echo was completed, no significant changes, revealed features of his uncontrolled hypertension.   Seen by PT OT, declined possible DME to reduce weightbearing load on left foot.  Advised to use icing as needed.  04/26/2023: The patient was seen and examined at his bedside.  No acute events overnight.   Hospital Course:  Principal Problem:   Atrial flutter  with rapid ventricular response (HCC) Active Problems:   Uncontrolled hypertension   Diabetes mellitus type 2, insulin dependent (HCC)   CKD (chronic kidney disease), stage III (HCC)   Atrial flutter (HCC)   Cocaine abuse (HCC)  Left foot pain, possibly from muscular spasms No evidence of fracture on x-ray No edema or erythema on exam Seen by PT OT, declined possible DME to reduce weightbearing load on left foot.  Advised to use icing as needed. As needed analgesics Follow-up with your PCP  A-fib with RVR, RVR has resolved. Continue Eliquis as primary CVA prevention Resume home Cardizem for rate control  Essential hypertension Resume your home oral antihypertensives Follow-up with cardiology  Chronic HFpEF  2D echo done on 04/26/2023 revealed LVEF 60 to 65% Continue your cardiac medications and follow-up with cardiology   Procedures: 2D echo  Consultations: Cardiology  Discharge Exam: BP 128/71 (BP Location: Right Arm)   Pulse 78   Temp 98.2 F (36.8 C) (Oral)   Resp 18   Ht 5\' 9"  (1.753 m)   Wt 116.8 kg   SpO2 95%   BMI 38.03 kg/m  General: 62 y.o. year-old male well developed well nourished in no acute distress.  Alert and oriented x3. Cardiovascular: Regular rate and rhythm with no rubs or gallops.  No thyromegaly or JVD noted.   Respiratory: Clear to auscultation with no wheezes or rales. Good inspiratory effort. Abdomen: Soft nontender nondistended with normal bowel sounds x4 quadrants. Musculoskeletal: No lower extremity edema. 2/4 pulses in all 4  extremities. Skin: No ulcerative lesions noted or rashes, Psychiatry: Mood is appropriate for condition and setting  Discharge Instructions You were cared for by a hospitalist during your hospital stay. If you have any questions about your discharge medications or the care you received while you were in the hospital after you are discharged, you can call the unit and asked to speak with the hospitalist on call if  the hospitalist that took care of you is not available. Once you are discharged, your primary care physician will handle any further medical issues. Please note that NO REFILLS for any discharge medications will be authorized once you are discharged, as it is imperative that you return to your primary care physician (or establish a relationship with a primary care physician if you do not have one) for your aftercare needs so that they can reassess your need for medications and monitor your lab values.   Allergies as of 04/26/2023   No Known Allergies      Medication List     STOP taking these medications    diclofenac Sodium 1 % Gel Commonly known as: Voltaren       TAKE these medications    carvedilol 3.125 MG tablet Commonly known as: Coreg Take 1 tablet (3.125 mg total) by mouth 2 (two) times daily.   diltiazem 240 MG 24 hr capsule Commonly known as: CARDIZEM CD Take 1 capsule (240 mg total) by mouth daily.   Eliquis 5 MG Tabs tablet Generic drug: apixaban Take 1 tablet (5 mg total) by mouth 2 (two) times daily.   glipiZIDE 10 MG tablet Commonly known as: GLUCOTROL Take 10 mg by mouth daily.   losartan 50 MG tablet Commonly known as: COZAAR Take 1 tablet (50 mg total) by mouth daily.   NovoLIN 70/30 (70-30) 100 UNIT/ML injection Generic drug: insulin NPH-regular Human Inject 35 units into the skin 2 times daily with a meal. What changed:  how much to take how to take this when to take this additional instructions   omeprazole 40 MG capsule Commonly known as: PRILOSEC Take 40 mg by mouth daily.   promethazine-dextromethorphan 6.25-15 MG/5ML syrup Commonly known as: PROMETHAZINE-DM Take 5 mLs by mouth 4 (four) times daily as needed for cough.   ZyrTEC Allergy 10 MG tablet Generic drug: cetirizine Take 10 mg by mouth in the morning.       No Known Allergies  Follow-up Information     Rana Snare, DO. Go on 05/05/2023.   Why: TIME : 1:00 PM DATE :  MAY 23 , 2024 LOCATION : [facility] INTERNAL MEDICINE , ENTRANCE -A GROUND FLOOR [address] [place], [place], [place] [address] Contact information: [address] [place] [address] [phone]         Lavinia Sharps, NP Follow up.   Contact information: [address] [place] [address] [phone]         Thurmon Fair, MD. Call today.   Specialty: Cardiology Why: Please call for a post hospital follow up appointment. Contact information: [address] [place] [address] [phone]                  The results of significant diagnostics from this hospitalization (including imaging, microbiology, ancillary and laboratory) are listed below for reference.    Significant Diagnostic Studies: ECHOCARDIOGRAM COMPLETE  Result Date: 04/26/2023    ECHOCARDIOGRAM REPORT   Patient Name:   Ryan [name] Date of Exam: 04/26/2023 Medical Rec #:  [id number]                Height:       69.0 in Accession #:    [id number]               Weight:       257.5 lb Date of Birth:  Sep 30, 1961               BSA:          2.300 m Patient Age:    61 years                 BP:           118/46 mmHg Patient Gender: M                        HR:           77 bpm. Exam Location:  Inpatient Procedure: 2D Echo, Color Doppler and Cardiac Doppler Indications:    atrial flutter  History:        Patient has prior history of Echocardiogram examinations, most                 recent 07/22/2020. Chronic kidney disease; Risk Factors:Diabetes,                 Hypertension and cocaine use.  Sonographer:    Delcie Roch RDCS Referring Phys: [id number] Cecille Po MELVIN IMPRESSIONS  1. Left ventricular ejection fraction, by estimation, is 60 to 65%. The left ventricle has normal function. The left ventricle has no regional wall motion abnormalities. There is mild concentric left ventricular hypertrophy. Left ventricular diastolic parameters are indeterminate.  2. Right ventricular  systolic function is normal. The right ventricular size is normal.  3. Left atrial size was mildly dilated.  4. The mitral valve is normal in structure. Trivial mitral valve regurgitation. No evidence of mitral stenosis.  5. The aortic valve is tricuspid. Aortic valve regurgitation is not visualized. No aortic stenosis is present.  6. The inferior vena cava is normal in size with greater than 50% respiratory variability, suggesting right atrial pressure of 3 mmHg. FINDINGS  Left Ventricle: Left ventricular ejection fraction, by estimation, is 60 to 65%. The left ventricle has normal function. The left ventricle has no regional wall motion abnormalities. The left ventricular internal cavity size was normal in size. There is  mild concentric left ventricular hypertrophy. Left ventricular diastolic parameters are indeterminate. Right Ventricle: The right ventricular size is normal. No increase in right ventricular wall thickness. Right ventricular systolic function is normal. Left Atrium: Left atrial size was mildly dilated. Right Atrium: Right atrial size was normal in size. Pericardium: There is no evidence of pericardial effusion. Mitral Valve: The mitral valve is normal in structure. Trivial mitral valve regurgitation. No evidence of mitral valve stenosis. Tricuspid Valve: The tricuspid valve is normal in structure. Tricuspid valve regurgitation is not demonstrated. No evidence of tricuspid stenosis. Aortic Valve: The aortic valve is tricuspid. Aortic valve regurgitation is not visualized. No aortic stenosis is present. Pulmonic Valve: The pulmonic valve was normal in structure. Pulmonic valve regurgitation is trivial. No evidence of pulmonic stenosis. Aorta: The aortic root is normal in size and structure. Venous: The inferior vena cava is normal in size with greater than 50% respiratory variability, suggesting right atrial pressure of 3 mmHg. IAS/Shunts: No atrial level shunt detected by color flow Doppler.  LEFT  VENTRICLE PLAX 2D LVIDd:  4.70 cm   Diastology LVIDs:         2.70 cm   LV e' medial:    6.53 cm/s LV PW:         1.40 cm   LV E/e' medial:  15.6 LV IVS:        1.20 cm   LV e' lateral:   7.72 cm/s LVOT diam:     1.80 cm   LV E/e' lateral: 13.2 LV SV:         60 LV SV Index:   26 LVOT Area:     2.54 cm  RIGHT VENTRICLE             IVC RV Basal diam:  2.80 cm     IVC diam: 1.80 cm RV S prime:     19.00 cm/s TAPSE (M-mode): 2.4 cm LEFT ATRIUM           Index        RIGHT ATRIUM           Index LA diam:      4.90 cm 2.13 cm/m   RA Area:     15.60 cm LA Vol (A2C): 78.0 ml 33.91 ml/m  RA Volume:   38.60 ml  16.78 ml/m  AORTIC VALVE LVOT Vmax:   132.00 cm/s LVOT Vmean:  83.600 cm/s LVOT VTI:    0.237 m  AORTA Ao Root diam: 3.10 cm Ao Asc diam:  3.10 cm MITRAL VALVE MV Area (PHT): 4.31 cm     SHUNTS MV Decel Time: 176 msec     Systemic VTI:  0.24 m MV E velocity: 102.00 cm/s  Systemic Diam: 1.80 cm MV A velocity: 53.00 cm/s MV E/A ratio:  1.92 Kardie Tobb DO Electronically signed by Thomasene Ripple DO Signature Date/Time: 04/26/2023/3:40:05 PM    Final    DG Foot Complete Left  Result Date: 04/25/2023 CLINICAL DATA:  Fall, pain and popping sensation EXAM: LEFT FOOT - COMPLETE 3+ VIEW COMPARISON:  04/25/2023 FINDINGS: There is no evidence of fracture or dislocation. There is no evidence of significant arthropathy or other focal bone abnormality. Plantar calcaneal spur noted as well as enthesopathic change at the Achilles insertion posteriorly. Soft tissues are unremarkable. IMPRESSION: No acute finding by plain radiography. Chronic findings as above. Electronically Signed   By: Judie Petit.  Shick M.D.   On: 04/25/2023 15:58   DG Chest Portable 1 View  Result Date: 04/25/2023 CLINICAL DATA:  Shortness of breath with exertion EXAM: PORTABLE CHEST 1 VIEW COMPARISON:  02/01/2023 FINDINGS: Persistent low lung volumes without focal pneumonia, edema, effusion or pneumothorax. Trachea midline. Normal heart size and  vascularity. No acute osseous finding. Telemetry leads overlie the chest. Overall stable exam. IMPRESSION: Low volume exam without acute process. Electronically Signed   By: Judie Petit.  Shick M.D.   On: 04/25/2023 15:56   DG Ankle Complete Left  Result Date: 04/25/2023 CLINICAL DATA:  Pain, popping sensation EXAM: LEFT ANKLE COMPLETE - 3+ VIEW COMPARISON:  04/25/2023 FINDINGS: There is no evidence of fracture, dislocation, or joint effusion. There is no evidence of significant arthropathy or other focal bone abnormality. Small plantar calcaneal spur. Enthesopathic change of the calcaneus posteriorly at the Achilles insertion. Soft tissues are unremarkable. IMPRESSION: No acute finding by plain radiography. Chronic findings as above. Electronically Signed   By: Judie Petit.  Shick M.D.   On: 04/25/2023 15:55   DG Knee AP/LAT W/Sunrise Left  Result Date: 04/11/2023 CLINICAL DATA:  Chronic left knee pain. EXAM: LEFT  KNEE 3 VIEWS COMPARISON:  February 14, 2018. FINDINGS: Small suprapatellar joint effusion is noted. No fracture or dislocation is noted. Moderate narrowing and osteophyte formation of lateral joint space is noted. Mild narrowing of medial joint space is noted. IMPRESSION: Moderate degenerative joint disease as described above. Small suprapatellar joint effusion. No fracture or dislocation. Electronically Signed   By: Lupita Raider M.D.   On: 04/11/2023 12:01    Microbiology: No results found for this or any previous visit (from the past 240 hour(s)).   Labs: Basic Metabolic Panel: Recent Labs  Lab 04/25/23 1555 04/26/23 0116  NA 132* 133*  K 3.9 3.1*  CL 100 101  CO2 16* 20*  GLUCOSE 393* 221*  BUN 31* 25*  CREATININE 1.77* 1.69*  CALCIUM 8.8* 8.4*   Liver Function Tests: Recent Labs  Lab 04/25/23 1555 04/26/23 0116  AST 18 17  ALT 17 16  ALKPHOS 75 65  BILITOT 0.2* 0.5  PROT 6.9 6.3*  ALBUMIN 3.5 3.0*   No results for input(s): "LIPASE", "AMYLASE" in the last 168 hours. No results for  input(s): "AMMONIA" in the last 168 hours. CBC: Recent Labs  Lab 04/25/23 1555 04/26/23 0116  WBC 10.1 9.4  NEUTROABS 6.9  --   HGB 12.5* 12.1*  HCT 39.2 36.7*  MCV 84.1 81.4  PLT 299 263   Cardiac Enzymes: No results for input(s): "CKTOTAL", "CKMB", "CKMBINDEX", "TROPONINI" in the last 168 hours. BNP: BNP (last 3 results) Recent Labs    06/23/22 1211 02/09/23 0956 04/25/23 1556  BNP 59.1 95.1 782.3*    ProBNP (last 3 results) No results for input(s): "PROBNP" in the last 8760 hours.  CBG: Recent Labs  Lab 04/25/23 2159 04/26/23 0837 04/26/23 1100 04/26/23 1607  GLUCAP 387* 171* 296* 234*       Signed:  Darlin Drop, MD [facility] 04/26/2023, 6:02 PM

## 2023-04-26 NOTE — Inpatient Diabetes Management (Signed)
Inpatient Diabetes Program Recommendations  AACE/ADA: New Consensus Statement on Inpatient Glycemic Control (2015)  Target Ranges:  Prepandial:   less than 140 mg/dL      Peak postprandial:   less than 180 mg/dL (1-2 hours)      Critically ill patients:  140 - 180 mg/dL   Lab Results  Component Value Date   GLUCAP 296 (H) 04/26/2023   HGBA1C 9.5 (H) 04/25/2023    Review of Glycemic Control  Latest Reference Range & Units 04/25/23 21:59 04/26/23 08:37 04/26/23 11:00  Glucose-Capillary 70 - 99 mg/dL 161 (H) 096 (H) 045 (H)  (H): Data is abnormally high Diabetes history: Type 2 DM Outpatient Diabetes medications: Novolog 70/30 45 units BID, Glipizide 10 mg QD Current orders for Inpatient glycemic control: Novolog 70/30 25 units BID, Novolog 0-15 unit TID & HS  Inpatient Diabetes Program Recommendations:    If to remain inpatient, consider increasing Novolog 70/30 35 units BID.   Spoke with patient regarding outpatient diabetes management. Verifies outpatient doses and states, "I keep it up with it myself". Admits to occasional missed doses.  Reviewed patient's current A1c of 9.5%. Explained what a A1c is and what it measures. Also reviewed goal A1c with patient, importance of good glucose control @ home, and blood sugar goals. Reviewed patho of DM, need for improved control, survival skills, interventions, impact of steroids to glucose trends, vascular changes and commorbidities.  Patient has a meter and reports checking daily with FSBG 160-180's mg/dL. Encouraged to increase frequency of CBGs and reviewed recommendations.  Encouraged PCP follow up.  Patient had no further questions.   Thanks, Lujean Rave, MSN, RNC-OB Diabetes Coordinator [phone] (8a-5p)

## 2023-04-26 NOTE — Progress Notes (Signed)
Patient discharged per MD order.  Patient education provided and discharge papers given to patient.  Work note provided.  Patient refused wheelchair to entrance.  Patient left with assistance of spouse.  No acute distress noted.

## 2023-05-05 ENCOUNTER — Ambulatory Visit: Payer: 59 | Admitting: Student

## 2023-05-10 ENCOUNTER — Telehealth: Payer: Self-pay

## 2023-05-10 DIAGNOSIS — R0683 Snoring: Secondary | ICD-10-CM

## 2023-05-10 NOTE — Telephone Encounter (Signed)
05-10-23 per instant message from Corrin Parker, PA-C: Ryan [name] is fine. Thanks so much! [id number]  Order entered Pt informed of providers result & recommendations. Pt verbalized understanding. Pt will await phone call for scheduling, etc..Marland KitchenMarland Kitchen

## 2023-05-10 NOTE — Progress Notes (Deleted)
Called patient and left a voice message for patient asking to give our office a call back for the sleep (STOP Bang score) questionnaire for the [place] sleep watch that was ordered per Dr. Royann Shivers and/or Marjie Skiff, PA-C.

## 2023-05-10 NOTE — Telephone Encounter (Signed)
CALLED AND DISCUSS SLEEP STUDY WITH PT. SCHEDULED AN APPT 5-31 PT WILL ARRIVE AT 230PM FOR APPT TO DISCUSS ORDERING SLEEP STUDY. PT VERBALIZED UNDERSTANDING. HE WILL BE COMING ON THE BUS.

## 2023-05-10 NOTE — Telephone Encounter (Signed)
-----   Message from Corrin Parker, [place] sent at 04/30/2023  1:23 PM EDT ----- Regarding: Home Sleep Study Ryan [name],  This patient was seen by Dr. Royann Shivers earlier this week in the hospital He recommended a home sleep study given atrial flutter and obesity with suspected sleep apnea. Wife reported patient snores loudly and has witnessed apneic episodes. Can you please help order a home sleep study?   Thank you so much! Callie

## 2023-05-10 NOTE — Telephone Encounter (Signed)
This encounter was created in error - please disregard.

## 2023-05-11 NOTE — Progress Notes (Signed)
Cardiology Office Note:    Date:  05/13/2023   ID:  Seleem, Sikich [birth year], MRN [id number]  PCP:  Lavinia Sharps, NP  Cardiologist:  Chrystie Nose, MD  Electrophysiologist:  None   Referring MD: No ref. provider found   Chief Complaint: hospital follow-up of atrial flutter  History of Present Illness:    Ryan [name] is a 62 y.o. male with a history of chest pain secondary to cocaine use, paroxysmal atrial flutter on Eliquis, chronic diastolic CHF with chronic lower extremity edema, hypertension, type 2 diabetes mellitus, GERD with esophagitis, CKD stage III, COVID infection in 05/2019, medication non-compliance, and frequently loss to follow-up who presents today for hospital follow-up of atrial flutter.  Patient was admitted with chest pain in the setting of cocaine use in 04/2011. He underwent a cardiac catheterization at that time which showed normal LV function with patent coronary arteries and no visualized vasospasm. He was subsequently treated for presumed coronary vasospasm. He was then admitted in 07/2020 for new onset atrial flutter with RVR. UDS was again positive for cocaine. Echo showed LVEF of 65-75% with normal wall motion. He was started on IV Cardizem and anticoagulation and fortunately converted back to normal rhythm. During this admission, he had severe chest pain. Chest CTA was negative for dissection but showed moderate hiatal hernia and probable reflux. EGD showed severe LA grade D erosive esophagitis in the mid and distal esophagus as well as mild gastritis. He has had recurrent issues with atrial flutter since then. However, management of this has been complicated by medication non-compliance and in-consistent follow-up. Patient was last seen in our office in 01/2022 at which time her reported palpitations and lightheadedness for the lat 24 hours and had not been taking his Eliquis due to the high cost. He was noted to be in rapid atrial  flutter and was transferred to the ED via EMS. He was started on IV Diltaizem and rates improved to the 70s. Therefore, he was felt to be stable for discharge with close follow-up in the [facility]. However, it does not look like he was ever followed up in the A.Fib.   He was not seen again by Cardiology until recent admission. He was admitted from 04/25/2023 to 04/26/2023 after presenting with left foot pain and was incidentally found to be in atrial flutter with RVR. Echo showed LVEF of 60-65% with normal wall motion and mild LVH, normal RV function, and trivial TR. He was started on IV Cardizem and Eliquis and converted back to sinus rhythm. He was transitioned to PO Cardizem and plan was for follow-up with EP as an outpatient as well as an outpatient sleep study.   Patient presents today for follow-up. Here alone. He continues to report intermittent palpitations on a daily basis. He had an episode this morning that last about 3-4 hours. He has associated shortness of breath and dizziness with these episodes and states it feels like his heart is going to beat out of his chest. However, no real chest pain. No shortness of breath or dizziness outside of these episodes. No orthopnea or PND. He has chronic lower extremity that he states has been worse over the last few months. He is not taking his Eliquis but states he is taking Coreg, Diltiazem, and Losartan.  Past Medical History:  Diagnosis Date   Achilles tendon rupture right   Arthritis    back and shoulders   Atrial flutter with rapid ventricular response (HCC) 07/22/2020  Chest pain    a. 05/2011 - admit w/ c/p in setting of cocaine-->Cath:  nl cors, nl EF, no vasospasm noted.   CKD (chronic kidney disease), stage III (HCC)    COVID-19 virus infection 05/31/2019   Diabetes mellitus without complication (HCC)    Drug abuse (HCC)    cocaine, marijuana abuse   GERD (gastroesophageal reflux disease)    History of echocardiogram    a. 07/2020  Echo: EF 65-70%, no rwma, mild conc LVH, nl RV fxn, mildly dil LA.   Hypertension     Past Surgical History:  Procedure Laterality Date   ACHILLES TENDON SURGERY Right 05/02/2014   Procedure: RIGHT ACHILLES TENDON REPAIR;  Surgeon: Javier Docker, MD;  Location: [facility] ORS;  Service: Orthopedics;  Laterality: Right;   BIOPSY  07/25/2020   Procedure: BIOPSY;  Surgeon: Kathi Der, MD;  Location: [facility] END[facility]COPY;  Service: Gastroenterology;;   CARDIAC CATHETERIZATION  05.31.2012   showing patent coronaries with no visualized vasospasm and EF of 55-60 %   ESOPHAGOGASTRODUODENOSCOPY N/A 07/25/2020   Procedure: ESOPHAGOGASTRODUODENOSCOPY (EGD);  Surgeon: Kathi Der, MD;  Location[facility] ENDOSCOPY;  Service: Gastroenterology;  Laterality: N/A;   ORBITAL FRACTURE SURGERY Left 2-3 yrs ago    Current Medications: Current Meds  Medication Sig   apixaban (ELIQUIS) 5 MG TABS tablet Take 1 tablet (5 mg total) by mouth 2 (two) times daily.   carvedilol (COREG) 3.125 MG tablet Take 1 tablet (3.125 mg total) by mouth 2 (two) times daily.   diltiazem (CARDIZEM CD) 240 MG 24 hr capsule Take 1 capsule (240 mg total) by mouth daily.   glipiZIDE (GLUCOTROL) 10 MG tablet Take 10 mg by mouth daily.   insulin NPH-regular Human (NOVOLIN 70/30) (70-30) 100 UNIT/ML injection Inject 35 units into the skin 2 times daily with a meal. (Patient taking differently: Inject 45 Units into the skin 2 (two) times daily with a meal.)   losartan (COZAAR) 50 MG tablet Take 1 tablet (50 mg total) by mouth daily.   omeprazole (PRILOSEC) 40 MG capsule Take 40 mg by mouth daily.   promethazine-dextromethorphan (PROMETHAZINE-DM) 6.25-15 MG/5ML syrup Take 5 mLs by mouth 4 (four) times daily as needed for cough.   ZYRTEC ALLERGY 10 MG tablet Take 10 mg by mouth in the morning.     Allergies:   Patient has no known allergies.   Social History   Socioeconomic History   Marital status: Single    Spouse name: Not on file    Number of children: Not on file   Years of education: Not on file   Highest education level: Not on file  Occupational History   Not on file  Tobacco Use   Smoking status: Never   Smokeless tobacco: Never  Vaping Use   Vaping Use: Never used  Substance and Sexual Activity   Alcohol use: Not Currently    Alcohol/week: 5.0 standard drinks of alcohol    Types: 5 Standard drinks or equivalent per week   Drug use: Not Currently    Types: Cocaine, Marijuana    Comment: last cocaine use 1 year ago, last marijuana use 4 to 5 years ago 01/06/22   Sexual activity: Not on file  Other Topics Concern   Not on file  Social History Narrative   ** Merged History Encounter **       Pt currently lives with his brother, and his sister in [place]. He is employed by [employer]. He denies tobacco use. He does endorse  occasional alcohol use. He does endorse cocaine use.  Family history : Non contributory   Social Determinants of Health   Financial Resource Strain: Not on file  Food Insecurity: No Food Insecurity (04/25/2023)   Hunger Vital Sign    Worried About Running Out of Food in the Last Year: Never true    Ran Out of Food in the Last Year: Never true  Transportation Needs: No Transportation Needs (04/25/2023)   PRAPARE - Administrator, Civil Service (Medical): No    Lack of Transportation (Non-Medical): No  Physical Activity: Not on file  Stress: Not on file  Social Connections: Not on file     Family History: The patient's family history includes Unexplained death in his father; Unexplained death (age of onset: 24) in his mother.  ROS:   Please see the history of present illness.     EKGs/Labs/Other Studies Reviewed:    The following studies were reviewed today:  Echocardiogram 04/26/2023: Impressions: 1. Left ventricular ejection fraction, by estimation, is 60 to 65%. The  left ventricle has normal function. The left ventricle has no regional  wall motion  abnormalities. There is mild concentric left ventricular  hypertrophy. Left ventricular diastolic  parameters are indeterminate.   2. Right ventricular systolic function is normal. The right ventricular  size is normal.   3. Left atrial size was mildly dilated.   4. The mitral valve is normal in structure. Trivial mitral valve  regurgitation. No evidence of mitral stenosis.   5. The aortic valve is tricuspid. Aortic valve regurgitation is not  visualized. No aortic stenosis is present.   6. The inferior vena cava is normal in size with greater than 50%  respiratory variability, suggesting right atrial pressure of 3 mmHg.   EKG:  EKG ordered today. EKG personally reviewed and demonstrates normal sinus rhythm with sinus arrhythmia, rate 85 bpm, with no acute ST/T changes. QTc 435 ms.   Recent Labs: 02/09/2023: Magnesium 2.0 04/25/2023: B Natriuretic Peptide 782.3 04/26/2023: ALT 16; BUN 25; Creatinine, Ser 1.69; Hemoglobin 12.1; Platelets 263; Potassium 4.3; Sodium 133  Recent Lipid Panel    Component Value Date/Time   CHOL 145 05/13/2011 0400   TRIG 65 05/13/2011 0400   HDL 77 05/13/2011 0400   CHOLHDL 1.9 05/13/2011 0400   VLDL 13 05/13/2011 0400   LDLCALC  05/13/2011 0400    55        Total Cholesterol/HDL:CHD Risk Coronary Heart Disease Risk Table                     Men   Women  1/2 Average Risk   3.4   3.3  Average Risk       5.0   4.4  2 X Average Risk   9.6   7.1  3 X Average Risk  23.4   11.0        Use the calculated Patient Ratio above and the CHD Risk Table to determine the patient's CHD Risk.        ATP III CLASSIFICATION (LDL):  <100     mg/dL   Optimal  161-096  mg/dL   Near or Above                    Optimal  130-159  mg/dL   Borderline  045-409  mg/dL   High  >811     mg/dL   Very High    Physical Exam:  Vital Signs: BP 102/68   Pulse 85   Ht 5\' 9"  (1.753 m)   Wt 242 lb 3.2 oz (109.9 kg)   SpO2 97%   BMI 35.77 kg/m     Wt Readings from Last 3  Encounters:  05/13/23 242 lb 3.2 oz (109.9 kg)  04/26/23 257 lb 8 oz (116.8 kg)  02/09/23 242 lb (109.8 kg)     General: 62 y.o. African-American male in no acute distress. HEENT: Normocephalic and atraumatic. Sclera clear. EOMs intact. Neck: Supple. No carotid bruits. No JVD. Heart: RRR. Distinct S1 and S2. No murmurs, gallops, or rubs.  Lungs: No increased work of breathing. Clear to ausculation bilaterally. No wheezes, rhonchi, or rales.  Abdomen: Soft, non-distended, and non-tender to palpation.  Extremities: 1+ pitting edema of bilateral lower extremities (right leg chronically larger than the right).  Skin: Warm and dry. Neuro: Alert and oriented x3. No focal deficits. Psych: Normal affect. Responds appropriately.   Assessment:    1. Atrial flutter, unspecified type (HCC)   2. Lower extremity edema   3. Chronic diastolic CHF (congestive heart failure) (HCC)   4. Primary hypertension   5. Type 2 diabetes mellitus with complication, with long-term current use of insulin (HCC)   6. Snoring   7. Stage 3 chronic kidney disease, unspecified whether stage 3a or 3b CKD (HCC)   8. History of cocaine abuse (HCC)     Plan:    Paroxysmal Atrial Flutter Patient has a history of atrial flutter. He was recently admitted for recurrent atrial flutter with RVR. He was started on IV Diltiazem and converted back to sinus rhythm. Echo during admission showed normal LV function.  - He continues to report palpitations on a daily basis with associated shortness of breath and dizziness.  - Maintaining sinus rhythm today.  - Continue  Coreg 3.125mg  twice daily. - Continue Diltiazem 240mg  daily.  - Supposed to be on chronic anticoagulation with Eliquis 5mg  twice daily but has not been taking this. Discussed the importance of taking Eliquis and it's role in helping reduce stroke risk. He voiced understanding and agreed.  - Will get 1 week Zio monitor.  - Will refer to EP per inpatient team  recommendation.   Lower Extremity Edema  Chronic Diastolic CHF Patient has a history of lower extremity edema and suspect he has some degree of diastolic CHF. BNP during recent admission was 782. Echo showed LVEF of 60-65% with no regional wall motion abnormalities and indeterminate diastolic parameters. He did not received any diuresis during recent admission. - Lower extremity on exam but otherwise no signs of volume overload.  - Will check BNP and BMET.  - Will likely either add daily Spironolactone (given hypokalemia in the past) or short course of Lasix depending on BNP and BMET results. BP is soft so if daily Spironolactone is added, would likely stop Losartan.   Hypertension BP soft but stable.  - Continue current medications: Coreg 3.125mg  twice daily, Diltiazem 240mg  daily, and Losartan 50mg  daily.    Type 2 Diabetes Mellitus Hemoglobin A1c 9.5% during recent admission earlier this month.  - On Glipizide and Insulin (Novolin 70/30). - Management per PCP.   CKD Stage III Base creatinine around 1.4 to 1.7. Creatinine was 1.69 on recent discharge.  - Will repeat BMET today.   Snoring Patient's partner during the hospital reported he snores loudly and has witnessed apneic episodes. STOP-BANG score = 7 indicating he is high risk for moderate to severe obstructive sleep apnea.  -  Will order sleep study.    History of Cocaine Abuse Patient has a long history of cocaine use. However, he denies using any cocaine over the last 5 months.   Disposition: Follow up in 3 months.    Medication Adjustments/Labs and Tests Ordered: Current medicines are reviewed at length with the patient today.  Concerns regarding medicines are outlined above.  Orders Placed This Encounter  Procedures   Basic metabolic panel   Brain natriuretic peptide   Ambulatory referral to Cardiac Electrophysiology   LONG TERM MONITOR (3-14 DAYS)   EKG 12-Lead   Split night study   No orders of the defined types  were placed in this encounter.   Patient Instructions  Medication Instructions:  MAKE SURE TO START YOUR ELIQUIS!!  The current medical regimen is effective;  continue present plan and medications as directed. Please refer to the Current Medication list given to you today.  *If you need a refill on your cardiac medications before your next appointment, please call your pharmacy*  Lab Work: BMET AND BNP TODAY If you have labs (blood work) drawn today and your tests are completely normal, you will receive your results only by: MyChart Message (if you have MyChart) OR  A paper copy in the mail If you have any lab test that is abnormal or we need to change your treatment, we will call you to review the results.  Testing/Procedures: IN LAB SLEEP STUDY SLEEP STUDY  1 WEEK ZIO MONITOR  REFERRAL TO OUR ELECTROPHYSIOLOGY FOR YOUR ATRIAL FLUTTER  Follow-Up: At [facility], you and your health needs are our priority.  As part of our continuing mission to provide you with exceptional heart care, we have created designated Provider Care Teams.  These Care Teams include your primary Cardiologist (physician) and Advanced Practice Providers (APPs -  Physician Assistants and Nurse Practitioners) who all work together to provide you with the care you need, when you need it.  We recommend signing up for the patient portal called "MyChart".  Sign up information is provided on this After Visit Summary.  MyChart is used to connect with patients for Virtual Visits (Telemedicine).  Patients are able to view lab/test results, encounter notes, upcoming appointments, etc.  Non-urgent messages can be sent to your provider as well.   To learn more about what you can do with MyChart, go to [email or web address].    Your next appointment:   3 month(s)  Provider:   Chrystie Nose, MD  or Marjie Skiff, PA-C        Other Instructions  Christena Deem- Long Term Monitor Instructions  Your physician has  requested you wear a ZIO patch monitor for 14 days.  This is a single patch monitor. [employer] supplies one patch monitor per enrollment. Additional stickers are not available. Please do not apply patch if you will be having a Nuclear Stress Test,  Echocardiogram, Cardiac CT, MRI, or Chest Xray during the period you would be wearing the  monitor. The patch cannot be worn during these tests. You cannot remove and re-apply the  ZIO XT patch monitor.  Your ZIO patch monitor will be mailed 3 day USPS to your address on file. It may take 3-5 days  to receive your monitor after you have been enrolled.  Once you have received your monitor, please review the enclosed instructions. Your monitor  has already been registered assigning a specific monitor serial # to you.  Billing and Patient Assistance Program Information  We have  supplied [employer] with any of your insurance information on file for billing purposes. [employer] offers a sliding scale Patient Assistance Program for patients that do not have  insurance, or whose insurance does not completely cover the cost of the ZIO monitor.  You must apply for the Patient Assistance Program to qualify for this discounted rate.  To apply, please call [employer] at [phone], select option 4, select option 2, ask to apply for  Patient Assistance Program. Meredeth Ide will ask your household income, and how many people  are in your household. They will quote your out-of-pocket cost based on that information.  [employer] will also be able to set up a 1-month, interest-free payment plan if needed.  Applying the monitor   Shave hair from upper left chest.  Hold abrader disc by orange tab. Rub abrader in 40 strokes over the upper left chest as  indicated in your monitor instructions.  Clean area with 4 enclosed alcohol pads. Let dry.  Apply patch as indicated in monitor instructions. Patch will be placed under collarbone on left  side of chest with arrow pointing  upward.  Rub patch adhesive wings for 2 minutes. Remove white label marked "1". Remove the white  label marked "2". Rub patch adhesive wings for 2 additional minutes.  While looking in a mirror, press and release button in center of patch. A small green light will  flash 3-4 times. This will be your only indicator that the monitor has been turned on.  Do not shower for the first 24 hours. You may shower after the first 24 hours.  Press the button if you feel a symptom. You will hear a small click. Record Date, Time and  Symptom in the Patient Logbook.  When you are ready to remove the patch, follow instructions on the last 2 pages of Patient  Logbook. Stick patch monitor onto the last page of Patient Logbook.  Place Patient Logbook in the blue and white box. Use locking tab on box and tape box closed  securely. The blue and white box has prepaid postage on it. Please place it in the mailbox as  soon as possible. Your physician should have your test results approximately 7 days after the  monitor has been mailed back to [facility].  Call [facility] Customer Care at [phone] if you have questions regarding  your ZIO XT patch monitor. Call them immediately if you see an orange light blinking on your  monitor.  If your monitor falls off in less than 4 days, contact our Monitor department at [phone].  If your monitor becomes loose or falls off after 4 days call [employer] at [phone] for  suggestions on securing your monitor    Signed, Smitty Knudsen  05/13/2023 5:11 PM    [facility]

## 2023-05-13 ENCOUNTER — Ambulatory Visit (INDEPENDENT_AMBULATORY_CARE_PROVIDER_SITE_OTHER): Payer: 59

## 2023-05-13 ENCOUNTER — Ambulatory Visit: Payer: 59 | Attending: Student | Admitting: Student

## 2023-05-13 ENCOUNTER — Encounter: Payer: Self-pay | Admitting: Student

## 2023-05-13 VITALS — BP 102/68 | HR 85 | Ht 69.0 in | Wt 242.2 lb

## 2023-05-13 DIAGNOSIS — R0683 Snoring: Secondary | ICD-10-CM

## 2023-05-13 DIAGNOSIS — I5032 Chronic diastolic (congestive) heart failure: Secondary | ICD-10-CM | POA: Diagnosis not present

## 2023-05-13 DIAGNOSIS — N183 Chronic kidney disease, stage 3 unspecified: Secondary | ICD-10-CM

## 2023-05-13 DIAGNOSIS — R600 Localized edema: Secondary | ICD-10-CM

## 2023-05-13 DIAGNOSIS — E118 Type 2 diabetes mellitus with unspecified complications: Secondary | ICD-10-CM | POA: Diagnosis not present

## 2023-05-13 DIAGNOSIS — I4892 Unspecified atrial flutter: Secondary | ICD-10-CM

## 2023-05-13 DIAGNOSIS — Z794 Long term (current) use of insulin: Secondary | ICD-10-CM | POA: Diagnosis not present

## 2023-05-13 DIAGNOSIS — I10 Essential (primary) hypertension: Secondary | ICD-10-CM

## 2023-05-13 DIAGNOSIS — F1411 Cocaine abuse, in remission: Secondary | ICD-10-CM

## 2023-05-13 NOTE — Patient Instructions (Addendum)
Medication Instructions:  MAKE SURE TO START YOUR ELIQUIS!!  The current medical regimen is effective;  continue present plan and medications as directed. Please refer to the Current Medication list given to you today.  *If you need a refill on your cardiac medications before your next appointment, please call your pharmacy*  Lab Work: BMET AND BNP TODAY If you have labs (blood work) drawn today and your tests are completely normal, you will receive your results only by: MyChart Message (if you have MyChart) OR  A paper copy in the mail If you have any lab test that is abnormal or we need to change your treatment, we will call you to review the results.  Testing/Procedures: IN LAB SLEEP STUDY SLEEP STUDY  1 WEEK ZIO MONITOR  REFERRAL TO OUR ELECTROPHYSIOLOGY FOR YOUR ATRIAL FLUTTER  Follow-Up: At [facility], you and your health needs are our priority.  As part of our continuing mission to provide you with exceptional heart care, we have created designated Provider Care Teams.  These Care Teams include your primary Cardiologist (physician) and Advanced Practice Providers (APPs -  Physician Assistants and Nurse Practitioners) who all work together to provide you with the care you need, when you need it.  We recommend signing up for the patient portal called "MyChart".  Sign up information is provided on this After Visit Summary.  MyChart is used to connect with patients for Virtual Visits (Telemedicine).  Patients are able to view lab/test results, encounter notes, upcoming appointments, etc.  Non-urgent messages can be sent to your provider as well.   To learn more about what you can do with MyChart, go to [email or web address].    Your next appointment:   3 month(s)  Provider:   Chrystie Nose, MD  or Marjie Skiff, PA-C        Other Instructions  Christena Deem- Long Term Monitor Instructions  Your physician has requested you wear a ZIO patch monitor for 14 days.  This is  a single patch monitor. [employer] supplies one patch monitor per enrollment. Additional stickers are not available. Please do not apply patch if you will be having a Nuclear Stress Test,  Echocardiogram, Cardiac CT, MRI, or Chest Xray during the period you would be wearing the  monitor. The patch cannot be worn during these tests. You cannot remove and re-apply the  ZIO XT patch monitor.  Your ZIO patch monitor will be mailed 3 day USPS to your address on file. It may take 3-5 days  to receive your monitor after you have been enrolled.  Once you have received your monitor, please review the enclosed instructions. Your monitor  has already been registered assigning a specific monitor serial # to you.  Billing and Patient Assistance Program Information  We have supplied [employer] with any of your insurance information on file for billing purposes. [employer] offers a sliding scale Patient Assistance Program for patients that do not have  insurance, or whose insurance does not completely cover the cost of the ZIO monitor.  You must apply for the Patient Assistance Program to qualify for this discounted rate.  To apply, please call [employer] at [phone], select option 4, select option 2, ask to apply for  Patient Assistance Program. Meredeth Ide will ask your household income, and how many people  are in your household. They will quote your out-of-pocket cost based on that information.  [employer] will also be able to set up a 33-month, interest-free payment plan if needed.  Applying  the monitor   Shave hair from upper left chest.  Hold abrader disc by orange tab. Rub abrader in 40 strokes over the upper left chest as  indicated in your monitor instructions.  Clean area with 4 enclosed alcohol pads. Let dry.  Apply patch as indicated in monitor instructions. Patch will be placed under collarbone on left  side of chest with arrow pointing upward.  Rub patch adhesive wings for 2 minutes. Remove white label  marked "1". Remove the white  label marked "2". Rub patch adhesive wings for 2 additional minutes.  While looking in a mirror, press and release button in center of patch. A small green light will  flash 3-4 times. This will be your only indicator that the monitor has been turned on.  Do not shower for the first 24 hours. You may shower after the first 24 hours.  Press the button if you feel a symptom. You will hear a small click. Record Date, Time and  Symptom in the Patient Logbook.  When you are ready to remove the patch, follow instructions on the last 2 pages of Patient  Logbook. Stick patch monitor onto the last page of Patient Logbook.  Place Patient Logbook in the blue and white box. Use locking tab on box and tape box closed  securely. The blue and white box has prepaid postage on it. Please place it in the mailbox as  soon as possible. Your physician should have your test results approximately 7 days after the  monitor has been mailed back to [facility].  Call [facility] Customer Care at [phone] if you have questions regarding  your ZIO XT patch monitor. Call them immediately if you see an orange light blinking on your  monitor.  If your monitor falls off in less than 4 days, contact our Monitor department at [phone].  If your monitor becomes loose or falls off after 4 days call [employer] at [phone] for  suggestions on securing your monitor

## 2023-05-13 NOTE — Progress Notes (Signed)
Error

## 2023-05-13 NOTE — Progress Notes (Unsigned)
Enrolled patient for a 7 day Zio XT monitor to be mailed to patients home  Mailed monitor Serial # [id number] fell off in one day. Redo monitor serial # [id number] from office inventory applied using tincture of benzoin. [employer] notified.  Hilty to read

## 2023-05-14 LAB — BASIC METABOLIC PANEL
BUN/Creatinine Ratio: 13 (ref 9–20)
BUN: 25 mg/dL — ABNORMAL HIGH (ref 6–24)
CO2: 22 mmol/L (ref 20–29)
Calcium: 9.6 mg/dL (ref 8.7–10.2)
Chloride: 97 mmol/L (ref 96–106)
Creatinine, Ser: 1.86 mg/dL — ABNORMAL HIGH (ref 0.76–1.27)
Glucose: 222 mg/dL — ABNORMAL HIGH (ref 70–99)
Potassium: 4.6 mmol/L (ref 3.5–5.2)
Sodium: 134 mmol/L (ref 134–144)
eGFR: 41 mL/min/{1.73_m2} — ABNORMAL LOW (ref >59)

## 2023-05-14 LAB — BRAIN NATRIURETIC PEPTIDE: BNP: 259 pg/mL — ABNORMAL HIGH (ref 0.0–100.0)

## 2023-05-17 ENCOUNTER — Other Ambulatory Visit: Payer: Self-pay

## 2023-05-17 DIAGNOSIS — I10 Essential (primary) hypertension: Secondary | ICD-10-CM

## 2023-05-17 DIAGNOSIS — I4892 Unspecified atrial flutter: Secondary | ICD-10-CM

## 2023-05-18 ENCOUNTER — Ambulatory Visit: Payer: 59 | Attending: Cardiology

## 2023-05-18 DIAGNOSIS — I4892 Unspecified atrial flutter: Secondary | ICD-10-CM

## 2023-05-20 ENCOUNTER — Ambulatory Visit: Payer: Self-pay

## 2023-05-20 NOTE — Telephone Encounter (Signed)
  Chief Complaint: Pt is out of insulin - pharmacy is also out. Symptoms: No insulin x1 day Frequency: 1 day Pertinent Negatives: Patient denies  Disposition: [x] ED /[] Urgent Care (no appt availability in office) / [] Appointment(In office/virtual)/ []  [place] [facility]/ [] Home Care/ [] Refused Recommended Disposition /[] [place] [facility]/ []  Follow-up with PCP Additional Notes: Pt states that he is out of insulin, and that the pharmacy does not have any. Pt will go to ED for care.    Reason for Disposition  [1] Prescription refill request for ESSENTIAL medicine (i.e., likelihood of harm to patient if not taken) AND [2] triager unable to refill per department policy  Answer Assessment - Initial Assessment Questions 1. DRUG NAME: "What medicine do you need to have refilled?"     Insulin 2. REFILLS REMAINING: "How many refills are remaining?" (Note: The label on the medicine or pill bottle will show how many refills are remaining. If there are no refills remaining, then a renewal may be needed.)     none 3. EXPIRATION DATE: "What is the expiration date?" (Note: The label states when the prescription will expire, and thus can no longer be refilled.)     NA  5. SYMPTOMS: "Do you have any symptoms?"     Pt is out of Insulin and pharmacy is also out.  Protocols used: Medication Refill and Renewal Call-A-AH

## 2023-05-24 ENCOUNTER — Encounter (HOSPITAL_COMMUNITY): Payer: Self-pay | Admitting: Emergency Medicine

## 2023-05-24 ENCOUNTER — Emergency Department (HOSPITAL_COMMUNITY): Payer: 59

## 2023-05-24 ENCOUNTER — Other Ambulatory Visit: Payer: Self-pay

## 2023-05-24 ENCOUNTER — Emergency Department (HOSPITAL_COMMUNITY)
Admission: EM | Admit: 2023-05-24 | Discharge: 2023-05-24 | Disposition: A | Discharge status: Home / Self Care | Payer: 59 | Attending: Emergency Medicine | Admitting: Emergency Medicine

## 2023-05-24 DIAGNOSIS — I10 Essential (primary) hypertension: Secondary | ICD-10-CM | POA: Diagnosis not present

## 2023-05-24 DIAGNOSIS — R1084 Generalized abdominal pain: Secondary | ICD-10-CM | POA: Diagnosis not present

## 2023-05-24 DIAGNOSIS — R739 Hyperglycemia, unspecified: Secondary | ICD-10-CM | POA: Diagnosis not present

## 2023-05-24 DIAGNOSIS — R55 Syncope and collapse: Secondary | ICD-10-CM | POA: Insufficient documentation

## 2023-05-24 DIAGNOSIS — G4489 Other headache syndrome: Secondary | ICD-10-CM | POA: Diagnosis not present

## 2023-05-24 DIAGNOSIS — R112 Nausea with vomiting, unspecified: Secondary | ICD-10-CM | POA: Diagnosis not present

## 2023-05-24 DIAGNOSIS — R519 Headache, unspecified: Secondary | ICD-10-CM | POA: Diagnosis not present

## 2023-05-24 DIAGNOSIS — R42 Dizziness and giddiness: Secondary | ICD-10-CM | POA: Diagnosis not present

## 2023-05-24 LAB — TROPONIN I (HIGH SENSITIVITY): Troponin I (High Sensitivity): 17 ng/L (ref <18)

## 2023-05-24 LAB — BASIC METABOLIC PANEL
Anion gap: 11 (ref 5–15)
BUN: 18 mg/dL (ref 6–20)
CO2: 18 mmol/L — ABNORMAL LOW (ref 22–32)
Calcium: 8.4 mg/dL — ABNORMAL LOW (ref 8.9–10.3)
Chloride: 105 mmol/L (ref 98–111)
Creatinine, Ser: 2.22 mg/dL — ABNORMAL HIGH (ref 0.61–1.24)
GFR, Estimated: 33 mL/min — ABNORMAL LOW (ref >60)
Glucose, Bld: 173 mg/dL — ABNORMAL HIGH (ref 70–99)
Potassium: 3.5 mmol/L (ref 3.5–5.1)
Sodium: 134 mmol/L — ABNORMAL LOW (ref 135–145)

## 2023-05-24 LAB — CBC
HCT: 39.2 % (ref 39.0–52.0)
Hemoglobin: 12.9 g/dL — ABNORMAL LOW (ref 13.0–17.0)
MCH: 27.6 pg (ref 26.0–34.0)
MCHC: 32.9 g/dL (ref 30.0–36.0)
MCV: 83.9 fL (ref 80.0–100.0)
Platelets: 234 10*3/uL (ref 150–400)
RBC: 4.67 MIL/uL (ref 4.22–5.81)
RDW: 16.4 % — ABNORMAL HIGH (ref 11.5–15.5)
WBC: 7.4 10*3/uL (ref 4.0–10.5)
nRBC: 0 % (ref 0.0–0.2)

## 2023-05-24 LAB — BRAIN NATRIURETIC PEPTIDE: B Natriuretic Peptide: 382.8 pg/mL — ABNORMAL HIGH (ref 0.0–100.0)

## 2023-05-24 LAB — CBG MONITORING, ED: Glucose-Capillary: 174 mg/dL — ABNORMAL HIGH (ref 70–99)

## 2023-05-24 MED ORDER — LACTATED RINGERS IV BOLUS
1000.0000 mL | Freq: Once | INTRAVENOUS | Status: AC
  Administered 2023-05-24: 1000 mL via INTRAVENOUS

## 2023-05-24 NOTE — ED Provider Notes (Signed)
[name] EMERGENCY DEPARTMENT AT [facility] Provider Note   CSN: [id number] Arrival date & time: 05/24/23  1457     History Chief Complaint  Patient presents with   Loss of Consciousness    HPI Ryan [name] is a 62 y.o. male presenting for chief complaint of syncope.  Patient states that he had nausea throughout the day today had 2 episodes of back-to-back emesis after decreased p.o. intake throughout the day today. Denies fevers chills, syncope or shortness of breath otherwise ambulatory tolerating p.o. intake asymptomatic on arrival here.  EMS arrived and patient was hypotensive tachycardic treated with a 500 cc IV fluid and Zofran with complete restoration of normal vital signs in transfer.  Patient endorses a substantial improvement during the interim of arrival to this evaluation.  History of syncope in the past has a cardiac monitor in place followed by cardiology has a follow-up appointment soon he states..   Patient's recorded medical, surgical, social, medication list and allergies were reviewed in the Snapshot window as part of the initial history.   Review of Systems   Review of Systems  Constitutional:  Negative for chills and fever.  HENT:  Negative for ear pain and sore throat.   Eyes:  Negative for pain and visual disturbance.  Respiratory:  Negative for cough and shortness of breath.   Cardiovascular:  Negative for chest pain and palpitations.  Gastrointestinal:  Positive for nausea and vomiting. Negative for abdominal pain.  Genitourinary:  Negative for dysuria and hematuria.  Musculoskeletal:  Negative for arthralgias and back pain.  Skin:  Negative for color change and rash.  Neurological:  Positive for syncope. Negative for seizures.  All other systems reviewed and are negative.   Physical Exam Updated Vital Signs BP 117/76   Pulse 73   Temp 97.7 F (36.5 C) (Oral)   Resp 19   Ht 5\' 9"  (1.753 m)   Wt 109 kg   SpO2 92%   BMI  35.49 kg/m  Physical Exam Vitals and nursing note reviewed.  Constitutional:      General: He is not in acute distress.    Appearance: He is well-developed.  HENT:     Head: Normocephalic and atraumatic.  Eyes:     Conjunctiva/sclera: Conjunctivae normal.  Cardiovascular:     Rate and Rhythm: Normal rate and regular rhythm.     Heart sounds: No murmur heard. Pulmonary:     Effort: Pulmonary effort is normal. No respiratory distress.     Breath sounds: Normal breath sounds.  Abdominal:     Palpations: Abdomen is soft.     Tenderness: There is no abdominal tenderness.  Musculoskeletal:        General: No swelling.     Cervical back: Neck supple.  Skin:    General: Skin is warm and dry.     Capillary Refill: Capillary refill takes less than 2 seconds.  Neurological:     Mental Status: He is alert.  Psychiatric:        Mood and Affect: Mood normal.      ED Course/ Medical Decision Making/ A&P    Procedures Procedures   Medications Ordered in ED Medications  lactated ringers bolus 1,000 mL (1,000 mLs Intravenous New Bag/Given 05/24/23 1857)   Medical Decision Making:   Ryan [name] is a 62 y.o. male who presented to the ED today with a syncopal episode detailed above.    Handoff received from EMS.  Additional history discussed with  patient's family/caregivers.  Patient placed on continuous vitals and telemetry monitoring while in ED which was reviewed periodically.  Complete initial physical exam performed, notably the patient  was HDS in NAD.    Reviewed and confirmed nursing documentation for past medical history, family history, social history.    Initial Assessment:   With the patient's presentation of syncope, most likely diagnosis is orthostatic hypotension vs vasovagal episode. Other diagnoses were considered including (but not limited to) arrythmogenic syncope, valvular abnormality, PE, aortic dissection. These are considered less likely due to  history of present illness and physical exam findings.   This is most consistent with an acute life/limb threatening illness complicated by underlying chronic conditions. In particular, concerning cardiac etiology, this is less likely to be the etiology given the lack of chest pain, lack of serious comorbidities including heart failure or CAD.   Seems more consistent with orthostatic episode based on prodrome prior to the event, poor p.o. intake leading into the event, palpitations and restoration normal vital signs with IV fluid    Initial Plan:  Screening labs including CBC and Metabolic panel to evaluate for infectious or metabolic etiology of disease.  Urinalysis with reflex culture ordered to evaluate for UTI or relevant urologic/nephrologic pathology.  CXR to evaluate for structural/infectious intrathoracic pathology.  EKG to evaluate for cardiac pathology. Utilization of FAINT scoring detailed above.  Objective evaluation as below reviewed after administration of IVF/Telemetry monitoring  Initial Study Results:   Laboratory  All laboratory results reviewed without evidence of clinically relevant pathology.     EKG EKG was reviewed independently. Rate, rhythm, axis, intervals all examined and without medically relevant abnormality. ST segments without concerns for elevations.    Radiology:  All images reviewed independently. Agree with radiology report at this time.   DG Chest Portable 1 View  Result Date: 05/24/2023 CLINICAL DATA:  Syncope with headache and hypertension. EXAM: PORTABLE CHEST 1 VIEW COMPARISON:  Apr 25, 2023 FINDINGS: The heart size and mediastinal contours are within normal limits. Low lung volumes are noted. There is no evidence of acute infiltrate, pleural effusion or pneumothorax. The visualized skeletal structures are unremarkable. IMPRESSION: No active disease. Electronically Signed   By: Aram Candela M.D.   On: 05/24/2023 17:05   ECHOCARDIOGRAM  COMPLETE  Result Date: 04/26/2023    ECHOCARDIOGRAM REPORT   Patient Name:   Ryan [name] [facility] Date of Exam: 04/26/2023 Medical Rec #:  [id number]                Height:       69.0 in Accession #:    [id number]               Weight:       257.5 lb Date of Birth:  08-04-1961               BSA:          2.300 m Patient Age:    61 years                 BP:           118/46 mmHg Patient Gender: M                        HR:           77 bpm. Exam Location:  Inpatient Procedure: 2D Echo, Color Doppler and Cardiac Doppler Indications:    atrial flutter  History:  Patient has prior history of Echocardiogram examinations, most                 recent 07/22/2020. Chronic kidney disease; Risk Factors:Diabetes,                 Hypertension and cocaine use.  Sonographer:    Delcie Roch RDCS Referring Phys: [id number] Cecille Po MELVIN IMPRESSIONS  1. Left ventricular ejection fraction, by estimation, is 60 to 65%. The left ventricle has normal function. The left ventricle has no regional wall motion abnormalities. There is mild concentric left ventricular hypertrophy. Left ventricular diastolic parameters are indeterminate.  2. Right ventricular systolic function is normal. The right ventricular size is normal.  3. Left atrial size was mildly dilated.  4. The mitral valve is normal in structure. Trivial mitral valve regurgitation. No evidence of mitral stenosis.  5. The aortic valve is tricuspid. Aortic valve regurgitation is not visualized. No aortic stenosis is present.  6. The inferior vena cava is normal in size with greater than 50% respiratory variability, suggesting right atrial pressure of 3 mmHg. FINDINGS  Left Ventricle: Left ventricular ejection fraction, by estimation, is 60 to 65%. The left ventricle has normal function. The left ventricle has no regional wall motion abnormalities. The left ventricular internal cavity size was normal in size. There is  mild concentric left ventricular hypertrophy.  Left ventricular diastolic parameters are indeterminate. Right Ventricle: The right ventricular size is normal. No increase in right ventricular wall thickness. Right ventricular systolic function is normal. Left Atrium: Left atrial size was mildly dilated. Right Atrium: Right atrial size was normal in size. Pericardium: There is no evidence of pericardial effusion. Mitral Valve: The mitral valve is normal in structure. Trivial mitral valve regurgitation. No evidence of mitral valve stenosis. Tricuspid Valve: The tricuspid valve is normal in structure. Tricuspid valve regurgitation is not demonstrated. No evidence of tricuspid stenosis. Aortic Valve: The aortic valve is tricuspid. Aortic valve regurgitation is not visualized. No aortic stenosis is present. Pulmonic Valve: The pulmonic valve was normal in structure. Pulmonic valve regurgitation is trivial. No evidence of pulmonic stenosis. Aorta: The aortic root is normal in size and structure. Venous: The inferior vena cava is normal in size with greater than 50% respiratory variability, suggesting right atrial pressure of 3 mmHg. IAS/Shunts: No atrial level shunt detected by color flow Doppler.  LEFT VENTRICLE PLAX 2D LVIDd:         4.70 cm   Diastology LVIDs:         2.70 cm   LV e' medial:    6.53 cm/s LV PW:         1.40 cm   LV E/e' medial:  15.6 LV IVS:        1.20 cm   LV e' lateral:   7.72 cm/s LVOT diam:     1.80 cm   LV E/e' lateral: 13.2 LV SV:         60 LV SV Index:   26 LVOT Area:     2.54 cm  RIGHT VENTRICLE             IVC RV Basal diam:  2.80 cm     IVC diam: 1.80 cm RV S prime:     19.00 cm/s TAPSE (M-mode): 2.4 cm LEFT ATRIUM           Index        RIGHT ATRIUM           Index LA  diam:      4.90 cm 2.13 cm/m   RA Area:     15.60 cm LA Vol (A2C): 78.0 ml 33.91 ml/m  RA Volume:   38.60 ml  16.78 ml/m  AORTIC VALVE LVOT Vmax:   132.00 cm/s LVOT Vmean:  83.600 cm/s LVOT VTI:    0.237 m  AORTA Ao Root diam: 3.10 cm Ao Asc diam:  3.10 cm MITRAL  VALVE MV Area (PHT): 4.31 cm     SHUNTS MV Decel Time: 176 msec     Systemic VTI:  0.24 m MV E velocity: 102.00 cm/s  Systemic Diam: 1.80 cm MV A velocity: 53.00 cm/s MV E/A ratio:  1.92 Kardie Tobb DO Electronically signed by Thomasene Ripple DO Signature Date/Time: 04/26/2023/3:40:05 PM    Final    DG Foot Complete Left  Result Date: 04/25/2023 CLINICAL DATA:  Fall, pain and popping sensation EXAM: LEFT FOOT - COMPLETE 3+ VIEW COMPARISON:  04/25/2023 FINDINGS: There is no evidence of fracture or dislocation. There is no evidence of significant arthropathy or other focal bone abnormality. Plantar calcaneal spur noted as well as enthesopathic change at the Achilles insertion posteriorly. Soft tissues are unremarkable. IMPRESSION: No acute finding by plain radiography. Chronic findings as above. Electronically Signed   By: Judie Petit.  Shick M.D.   On: 04/25/2023 15:58   DG Chest Portable 1 View  Result Date: 04/25/2023 CLINICAL DATA:  Shortness of breath with exertion EXAM: PORTABLE CHEST 1 VIEW COMPARISON:  02/01/2023 FINDINGS: Persistent low lung volumes without focal pneumonia, edema, effusion or pneumothorax. Trachea midline. Normal heart size and vascularity. No acute osseous finding. Telemetry leads overlie the chest. Overall stable exam. IMPRESSION: Low volume exam without acute process. Electronically Signed   By: Judie Petit.  Shick M.D.   On: 04/25/2023 15:56   DG Ankle Complete Left  Result Date: 04/25/2023 CLINICAL DATA:  Pain, popping sensation EXAM: LEFT ANKLE COMPLETE - 3+ VIEW COMPARISON:  04/25/2023 FINDINGS: There is no evidence of fracture, dislocation, or joint effusion. There is no evidence of significant arthropathy or other focal bone abnormality. Small plantar calcaneal spur. Enthesopathic change of the calcaneus posteriorly at the Achilles insertion. Soft tissues are unremarkable. IMPRESSION: No acute finding by plain radiography. Chronic findings as above. Electronically Signed   By: Judie Petit.  Shick M.D.    On: 04/25/2023 15:55     Final Assessment and Plan:   Patient's history of present illness and physical exam findings are most consistent with nonspecific syncope.  Likely orthostatic versus vasovagal.  I was called back to bedside as patient has requested discharge.  States that all of his symptoms have resolved and he already has a cardiologist does not want to stay for any further care and management.  Discussed his risk for cardiac syncope given his history, however given resolution of symptoms I do believe he stable for outpatient care management with strict return precautions.  Disposition:  Patient is requesting discharge at this time.  Given patient's understanding of risk of severe missed diagnosis based on limitations of today's evaluation and risk of interval worsening of disease including life or limb threatening pathology, will participate in shared medical decision making and patient directed discharge at this time.  Patient is welcome to return for further diagnostic evaluation/therapeutic management at any time.     Clinical Impression:  1. Near syncope      Discharge    Clinical Impression:  1. Near syncope      Discharge   Final Clinical Impression(s) /  ED Diagnoses Final diagnoses:  Near syncope    Rx / DC Orders ED Discharge Orders     None         Glyn Ade, MD 05/24/23 2002

## 2023-05-24 NOTE — ED Triage Notes (Signed)
Pt arrives via EMS after 2 syncopal episodes with n/v. Pt reports symptoms started today with 2 episodes of emesis. Pt wearing external heart monitor but unsure why. Pt reports 1 beer today.  EMS reports 80/52, 500 cc and 4 mg Zofran given by EMS. Pressure up to 106/61

## 2023-05-24 NOTE — ED Notes (Signed)
Pt was able to tolerate PO liquids and sandwich with no issues. MD notified

## 2023-06-13 ENCOUNTER — Encounter: Payer: Self-pay | Admitting: Cardiovascular Disease

## 2023-06-13 ENCOUNTER — Ambulatory Visit: Payer: 59 | Attending: Cardiovascular Disease | Admitting: Cardiovascular Disease

## 2023-06-13 NOTE — Addendum Note (Signed)
Addended by: Brunetta Genera on: 06/13/2023 02:49 PM   Modules accepted: Orders

## 2023-07-13 ENCOUNTER — Other Ambulatory Visit: Payer: Self-pay

## 2023-07-13 ENCOUNTER — Encounter (HOSPITAL_COMMUNITY): Payer: Self-pay

## 2023-07-13 ENCOUNTER — Observation Stay (HOSPITAL_COMMUNITY)
Admission: EM | Admit: 2023-07-13 | Discharge: 2023-07-14 | Disposition: A | Discharge status: Home / Self Care | Payer: 59 | Attending: Family Medicine | Admitting: Family Medicine

## 2023-07-13 ENCOUNTER — Emergency Department (HOSPITAL_COMMUNITY): Payer: 59

## 2023-07-13 DIAGNOSIS — E876 Hypokalemia: Secondary | ICD-10-CM | POA: Diagnosis not present

## 2023-07-13 DIAGNOSIS — Z1152 Encounter for screening for COVID-19: Secondary | ICD-10-CM | POA: Insufficient documentation

## 2023-07-13 DIAGNOSIS — Z8616 Personal history of COVID-19: Secondary | ICD-10-CM | POA: Diagnosis not present

## 2023-07-13 DIAGNOSIS — Z794 Long term (current) use of insulin: Secondary | ICD-10-CM | POA: Diagnosis not present

## 2023-07-13 DIAGNOSIS — E119 Type 2 diabetes mellitus without complications: Secondary | ICD-10-CM | POA: Diagnosis not present

## 2023-07-13 DIAGNOSIS — F1410 Cocaine abuse, uncomplicated: Secondary | ICD-10-CM | POA: Diagnosis not present

## 2023-07-13 DIAGNOSIS — I129 Hypertensive chronic kidney disease with stage 1 through stage 4 chronic kidney disease, or unspecified chronic kidney disease: Secondary | ICD-10-CM | POA: Diagnosis not present

## 2023-07-13 DIAGNOSIS — E1122 Type 2 diabetes mellitus with diabetic chronic kidney disease: Secondary | ICD-10-CM | POA: Insufficient documentation

## 2023-07-13 DIAGNOSIS — R0989 Other specified symptoms and signs involving the circulatory and respiratory systems: Secondary | ICD-10-CM | POA: Diagnosis not present

## 2023-07-13 DIAGNOSIS — U071 COVID-19: Secondary | ICD-10-CM | POA: Diagnosis not present

## 2023-07-13 DIAGNOSIS — N183 Chronic kidney disease, stage 3 unspecified: Secondary | ICD-10-CM | POA: Diagnosis present

## 2023-07-13 DIAGNOSIS — I4891 Unspecified atrial fibrillation: Secondary | ICD-10-CM | POA: Diagnosis not present

## 2023-07-13 DIAGNOSIS — Z7902 Long term (current) use of antithrombotics/antiplatelets: Secondary | ICD-10-CM | POA: Insufficient documentation

## 2023-07-13 DIAGNOSIS — N1831 Chronic kidney disease, stage 3a: Secondary | ICD-10-CM | POA: Diagnosis not present

## 2023-07-13 DIAGNOSIS — R0602 Shortness of breath: Secondary | ICD-10-CM | POA: Diagnosis not present

## 2023-07-13 DIAGNOSIS — I10 Essential (primary) hypertension: Secondary | ICD-10-CM | POA: Diagnosis present

## 2023-07-13 LAB — BRAIN NATRIURETIC PEPTIDE: B Natriuretic Peptide: 395.5 pg/mL — ABNORMAL HIGH (ref 0.0–100.0)

## 2023-07-13 LAB — BASIC METABOLIC PANEL
Anion gap: 16 — ABNORMAL HIGH (ref 5–15)
BUN: 13 mg/dL (ref 6–20)
CO2: 19 mmol/L — ABNORMAL LOW (ref 22–32)
Calcium: 8.9 mg/dL (ref 8.9–10.3)
Chloride: 103 mmol/L (ref 98–111)
Creatinine, Ser: 1.55 mg/dL — ABNORMAL HIGH (ref 0.61–1.24)
GFR, Estimated: 51 mL/min — ABNORMAL LOW (ref >60)
Glucose, Bld: 250 mg/dL — ABNORMAL HIGH (ref 70–99)
Potassium: 3.4 mmol/L — ABNORMAL LOW (ref 3.5–5.1)
Sodium: 138 mmol/L (ref 135–145)

## 2023-07-13 LAB — MAGNESIUM: Magnesium: 1.9 mg/dL (ref 1.7–2.4)

## 2023-07-13 LAB — GLUCOSE, CAPILLARY: Glucose-Capillary: 169 mg/dL — ABNORMAL HIGH (ref 70–99)

## 2023-07-13 LAB — CBC
HCT: 45.2 % (ref 39.0–52.0)
Hemoglobin: 14.2 g/dL (ref 13.0–17.0)
MCH: 25.6 pg — ABNORMAL LOW (ref 26.0–34.0)
MCHC: 31.4 g/dL (ref 30.0–36.0)
MCV: 81.6 fL (ref 80.0–100.0)
Platelets: 262 10*3/uL (ref 150–400)
RBC: 5.54 MIL/uL (ref 4.22–5.81)
RDW: 15.5 % (ref 11.5–15.5)
WBC: 7.8 10*3/uL (ref 4.0–10.5)
nRBC: 0 % (ref 0.0–0.2)

## 2023-07-13 LAB — TROPONIN I (HIGH SENSITIVITY)
Troponin I (High Sensitivity): 27 ng/L — ABNORMAL HIGH (ref <18)
Troponin I (High Sensitivity): 22 ng/L — ABNORMAL HIGH (ref <18)

## 2023-07-13 LAB — SARS CORONAVIRUS 2 BY RT PCR: SARS Coronavirus 2 by RT PCR: POSITIVE — AB

## 2023-07-13 LAB — CBG MONITORING, ED: Glucose-Capillary: 165 mg/dL — ABNORMAL HIGH (ref 70–99)

## 2023-07-13 MED ORDER — LOSARTAN POTASSIUM 50 MG PO TABS
50.0000 mg | ORAL_TABLET | Freq: Every day | ORAL | Status: DC
  Administered 2023-07-13 – 2023-07-14 (×2): 50 mg via ORAL
  Filled 2023-07-13 (×2): qty 1

## 2023-07-13 MED ORDER — DILTIAZEM HCL ER COATED BEADS 240 MG PO CP24
240.0000 mg | ORAL_CAPSULE | Freq: Every day | ORAL | Status: DC
  Administered 2023-07-14: 240 mg via ORAL
  Filled 2023-07-13: qty 1
  Filled 2023-07-13: qty 2

## 2023-07-13 MED ORDER — INSULIN ASPART 100 UNIT/ML IJ SOLN
0.0000 [IU] | Freq: Three times a day (TID) | INTRAMUSCULAR | Status: DC
  Administered 2023-07-13: 2 [IU] via SUBCUTANEOUS
  Administered 2023-07-14: 1 [IU] via SUBCUTANEOUS

## 2023-07-13 MED ORDER — FAMOTIDINE 20 MG PO TABS
20.0000 mg | ORAL_TABLET | Freq: Once | ORAL | Status: AC
  Administered 2023-07-13: 20 mg via ORAL
  Filled 2023-07-13: qty 1

## 2023-07-13 MED ORDER — SODIUM CHLORIDE 0.9% FLUSH
3.0000 mL | Freq: Two times a day (BID) | INTRAVENOUS | Status: DC
  Administered 2023-07-13 – 2023-07-14 (×3): 3 mL via INTRAVENOUS

## 2023-07-13 MED ORDER — POLYETHYLENE GLYCOL 3350 17 G PO PACK: 17.0000 g | PACK | Freq: Every day | ORAL | Status: DC | PRN

## 2023-07-13 MED ORDER — ENSURE ENLIVE PO LIQD
237.0000 mL | Freq: Two times a day (BID) | ORAL | Status: DC
  Administered 2023-07-14: 237 mL via ORAL

## 2023-07-13 MED ORDER — APIXABAN 5 MG PO TABS
5.0000 mg | ORAL_TABLET | Freq: Two times a day (BID) | ORAL | Status: DC
  Administered 2023-07-13 – 2023-07-14 (×2): 5 mg via ORAL
  Filled 2023-07-13: qty 2
  Filled 2023-07-13: qty 1

## 2023-07-13 MED ORDER — POTASSIUM CHLORIDE CRYS ER 20 MEQ PO TBCR
40.0000 meq | EXTENDED_RELEASE_TABLET | Freq: Once | ORAL | Status: AC
  Administered 2023-07-13: 40 meq via ORAL
  Filled 2023-07-13: qty 2

## 2023-07-13 MED ORDER — ACETAMINOPHEN 325 MG PO TABS: 650.0000 mg | ORAL_TABLET | Freq: Four times a day (QID) | ORAL | Status: DC | PRN

## 2023-07-13 MED ORDER — CARVEDILOL 3.125 MG PO TABS
3.1250 mg | ORAL_TABLET | Freq: Two times a day (BID) | ORAL | Status: DC
  Administered 2023-07-13 – 2023-07-14 (×2): 3.125 mg via ORAL
  Filled 2023-07-13 (×2): qty 1

## 2023-07-13 MED ORDER — PANTOPRAZOLE SODIUM 40 MG PO TBEC
40.0000 mg | DELAYED_RELEASE_TABLET | Freq: Every day | ORAL | Status: DC
  Administered 2023-07-14: 40 mg via ORAL
  Filled 2023-07-13: qty 1

## 2023-07-13 MED ORDER — DILTIAZEM LOAD VIA INFUSION
20.0000 mg | Freq: Once | INTRAVENOUS | Status: AC
  Administered 2023-07-13: 20 mg via INTRAVENOUS
  Filled 2023-07-13: qty 20

## 2023-07-13 MED ORDER — DILTIAZEM HCL-DEXTROSE 125-5 MG/125ML-% IV SOLN (PREMIX)
5.0000 mg/h | INTRAVENOUS | Status: DC
  Administered 2023-07-13: 5 mg/h via INTRAVENOUS
  Filled 2023-07-13: qty 125

## 2023-07-13 MED ORDER — ACETAMINOPHEN 650 MG RE SUPP: 650.0000 mg | Freq: Four times a day (QID) | RECTAL | Status: DC | PRN

## 2023-07-13 MED ORDER — INSULIN ASPART PROT & ASPART (70-30 MIX) 100 UNIT/ML ~~LOC~~ SUSP
25.0000 [IU] | Freq: Two times a day (BID) | SUBCUTANEOUS | Status: DC
  Administered 2023-07-13 – 2023-07-14 (×2): 25 [IU] via SUBCUTANEOUS
  Filled 2023-07-13: qty 10

## 2023-07-13 MED ORDER — ALUM & MAG HYDROXIDE-SIMETH 200-200-20 MG/5ML PO SUSP
30.0000 mL | ORAL | Status: DC | PRN
  Administered 2023-07-14: 30 mL via ORAL
  Filled 2023-07-13: qty 30

## 2023-07-13 MED ORDER — ORAL CARE MOUTH RINSE: 15.0000 mL | OROMUCOSAL | Status: DC | PRN

## 2023-07-13 NOTE — ED Provider Notes (Signed)
[place] EMERGENCY DEPARTMENT AT [facility] Provider Note   CSN: [id number] Arrival date & time: 07/13/23  1042     History  Chief Complaint  Patient presents with   Shortness of Breath   Weakness    Ryan [name] is a 62 y.o. male with a history of a flutter, diabetes, hypertension, presenting to the ED with concern for shortness of breath and generalized weakness.  He has some intermittent left-sided chest discomfort.  This been ongoing for several days.  He reports he has been compliant with the Eliquis was provided to him on his last hospital discharge, as well as all of his other medications including Cardizem and Coreg.  He says he has been out of his Lasix for unclear amount of time.  He does report leg swelling.  External records reviewed, patient lysing by cardiology 2 months ago in May, for which she was admitted in the hospital for injury to his extremity but also noted to be in a flutter with a rapid ventricular rate.  He converted back to sinus rhythm at that time.  HPI     Home Medications Prior to Admission medications   Medication Sig Start Date End Date Taking? Authorizing Provider  apixaban (ELIQUIS) 5 MG TABS tablet Take 1 tablet (5 mg total) by mouth 2 (two) times daily. 04/26/23 07/25/23  Darlin Drop, DO  carvedilol (COREG) 3.125 MG tablet Take 1 tablet (3.125 mg total) by mouth 2 (two) times daily. 04/26/23 05/26/23  Darlin Drop, DO  diltiazem (CARDIZEM CD) 240 MG 24 hr capsule Take 1 capsule (240 mg total) by mouth daily. 04/26/23   Darlin Drop, DO  glipiZIDE (GLUCOTROL) 10 MG tablet Take 10 mg by mouth daily. 03/04/21   [provider]  insulin NPH-regular Human (NOVOLIN 70/30) (70-30) 100 UNIT/ML injection Inject 35 units into the skin 2 times daily with a meal. Patient taking differently: Inject 45 Units into the skin 2 (two) times daily with a meal. 07/25/20   Marjie Skiff E, PA-C  losartan (COZAAR) 50 MG tablet Take 1  tablet (50 mg total) by mouth daily. 04/26/23 05/26/23  Darlin Drop, DO  omeprazole (PRILOSEC) 40 MG capsule Take 40 mg by mouth daily. 01/07/21   [provider]  promethazine-dextromethorphan (PROMETHAZINE-DM) 6.25-15 MG/5ML syrup Take 5 mLs by mouth 4 (four) times daily as needed for cough. 04/11/23   Rising, Lurena Joiner, PA-C  ZYRTEC ALLERGY 10 MG tablet Take 10 mg by mouth in the morning. 03/03/20   [provider]      Allergies    Patient has no known allergies.    Review of Systems   Review of Systems  Physical Exam Updated Vital Signs BP (!) 148/106   Pulse 77   Temp 97.6 F (36.4 C) (Oral)   Resp 18   Ht 5\' 9"  (1.753 m)   Wt 109 kg   SpO2 98%   BMI 35.49 kg/m  Physical Exam Constitutional:      General: He is not in acute distress. HENT:     Head: Normocephalic and atraumatic.  Eyes:     Conjunctiva/sclera: Conjunctivae normal.     Pupils: Pupils are equal, round, and reactive to light.  Cardiovascular:     Rate and Rhythm: Tachycardia present. Rhythm irregular.  Pulmonary:     Effort: Pulmonary effort is normal. No respiratory distress.  Abdominal:     General: There is no distension.     Tenderness: There is  no abdominal tenderness.  Musculoskeletal:     Right lower leg: Edema present.     Left lower leg: Edema present.  Skin:    General: Skin is warm and dry.  Neurological:     General: No focal deficit present.     Mental Status: He is alert. Mental status is at baseline.  Psychiatric:        Mood and Affect: Mood normal.        Behavior: Behavior normal.     ED Results / Procedures / Treatments   Labs (all labs ordered are listed, but only abnormal results are displayed) Labs Reviewed  SARS CORONAVIRUS 2 BY RT PCR - Abnormal; Notable for the following components:      Result Value   SARS Coronavirus 2 by RT PCR POSITIVE (*)    All other components within normal limits  BASIC METABOLIC PANEL - Abnormal; Notable for the following  components:   Potassium 3.4 (*)    CO2 19 (*)    Glucose, Bld 250 (*)    Creatinine, Ser 1.55 (*)    GFR, Estimated 51 (*)    Anion gap 16 (*)    All other components within normal limits  CBC - Abnormal; Notable for the following components:   MCH 25.6 (*)    All other components within normal limits  BRAIN NATRIURETIC PEPTIDE - Abnormal; Notable for the following components:   B Natriuretic Peptide 395.5 (*)    All other components within normal limits  TROPONIN I (HIGH SENSITIVITY) - Abnormal; Notable for the following components:   Troponin I (High Sensitivity) 27 (*)    All other components within normal limits  MAGNESIUM  TROPONIN I (HIGH SENSITIVITY)    EKG EKG Interpretation Date/Time:  Wednesday July 13 2023 10:43:24 EDT Ventricular Rate:  160 PR Interval:    QRS Duration:  80 QT Interval:  252 QTC Calculation: 411 R Axis:   20  Text Interpretation: Atrial flutter with 2:1 A-V conduction Nonspecific ST and T wave abnormality Abnormal ECG When compared with ECG of 24-May-2023 15:04, PREVIOUS ECG IS PRESENT Confirmed by Alvester Chou [phone]) on 07/13/2023 11:09:30 AM  Radiology DG Chest 1 View  Result Date: 07/13/2023 CLINICAL DATA:  141880 SOB (shortness of breath) 141880 EXAM: CHEST  1 VIEW COMPARISON:  CXR 05/24/23 FINDINGS: No pleural effusion. No pneumothorax. No focal airspace opacity. Low lung volumes. Unchanged cardiac and mediastinal contours. No radiographically apparent displaced rib fractures. Visualized upper abdomen is unremarkable. IMPRESSION: Low lung volumes.  No focal airspace opacity. Electronically Signed   By: Lorenza Cambridge M.D.   On: 07/13/2023 12:07    Procedures .Critical Care  Performed by: Terald Sleeper, MD Authorized by: Terald Sleeper, MD   Critical care provider statement:    Critical care time (minutes):  45   Critical care time was exclusive of:  Separately billable procedures and treating other patients   Critical care was  necessary to treat or prevent imminent or life-threatening deterioration of the following conditions:  Cardiac failure and circulatory failure   Critical care was time spent personally by me on the following activities:  Ordering and performing treatments and interventions, ordering and review of laboratory studies, ordering and review of radiographic studies, pulse oximetry, review of old charts, examination of patient and evaluation of patient's response to treatment     Medications Ordered in ED Medications  diltiazem (CARDIZEM) 1 mg/mL load via infusion 20 mg (20 mg Intravenous Bolus from Bag  07/13/23 1140)    And  diltiazem (CARDIZEM) 125 mg in dextrose 5% 125 mL (1 mg/mL) infusion (10 mg/hr Intravenous Rate/Dose Change 07/13/23 1257)  losartan (COZAAR) tablet 50 mg (has no administration in time range)  carvedilol (COREG) tablet 3.125 mg (has no administration in time range)  diltiazem (CARDIZEM CD) 24 hr capsule 240 mg (has no administration in time range)  pantoprazole (PROTONIX) EC tablet 40 mg (has no administration in time range)  apixaban (ELIQUIS) tablet 5 mg (has no administration in time range)  sodium chloride flush (NS) 0.9 % injection 3 mL (3 mLs Intravenous Given 07/13/23 1335)  acetaminophen (TYLENOL) tablet 650 mg (has no administration in time range)    Or  acetaminophen (TYLENOL) suppository 650 mg (has no administration in time range)  polyethylene glycol (MIRALAX / GLYCOLAX) packet 17 g (has no administration in time range)    ED Course/ Medical Decision Making/ A&P Clinical Course as of 07/13/23 1336  Wed Jul 13, 2023  1212 SARS Coronavirus 2 by RT PCR(!): POSITIVE [MT]  1235 HR 130's titrating diltiazem infusion.  Patient was very upset when I explained his positive covid diagnosis; he repeatedly tells me "I don't have Covid, that's not true," but cannot or will not explain why he feels this way.  Regardless I think he is needing hospitalization and he is agreeable  to staying for treatment. [MT]  1336 Admitted to hospitalist Dr Alinda Money [MT]    Clinical Course User Index [MT] Renaye Rakers Kermit Balo, MD                                 Medical Decision Making Amount and/or Complexity of Data Reviewed Labs: ordered. Decision-making details documented in ED Course. Radiology: ordered.  Risk Prescription drug management. Decision regarding hospitalization.   This patient presents to the ED with concern for left-sided chest discomfort, palpitations, shortness of breath. This involves an extensive number of treatment options, and is a complaint that carries with it a high risk of complications and morbidity.  The differential diagnosis includes A-fib with RVR versus pneumonia versus congestive heart failure versus ACS versus pleural effusion versus other  Co-morbidities that complicate the patient evaluation: History of several cardiovascular risk factors including high blood pressure and diabetes, history of a flutter, at high risk of recurrence and cardiac complications  External records from outside source obtained and reviewed including last hospital course and cardiology evaluation, noted in history above  I ordered and personally interpreted labs.  The pertinent results include: Very elevation of troponin, no acute anemia, white blood cell count within normal limits.  Chronic kidney disease at baseline level  I ordered imaging studies including x-ray of the chest I independently visualized and interpreted imaging which showed no acute abnormalities, low lung volumes I agree with the radiologist interpretation  The patient was maintained on a cardiac monitor.  I personally viewed and interpreted the cardiac monitored which showed an underlying rhythm of: A flutter with RVR  Per my interpretation the patient's ECG shows a flutter with RVR  I ordered medication including IV diltiazem bolus and infusion for heart rate control  I have reviewed the  patients home medicines and have made adjustments as needed  Test Considered: Doubt acute PE in this clinical setting.  Suspect patient likely is in A-fib with RVR is secondary to COVID  After the interventions noted above, I reevaluated the patient and found that they have:  stayed the same  Dispostion:  After consideration of the diagnostic results and the patients response to treatment, I feel that the patent would benefit from medical admission.         Final Clinical Impression(s) / ED Diagnoses Final diagnoses:  Atrial fibrillation with RVR (HCC)  COVID-19    Rx / DC Orders ED Discharge Orders     None         Terald Sleeper, MD 07/13/23 1336

## 2023-07-13 NOTE — H&P (Signed)
History and Physical   Ryan [name] KGM:[id number] DOB: [birth year] DOA: 07/13/2023  PCP: Lavinia Sharps, NP   Patient coming from: Home  Chief Complaint: Shortness of breath  HPI: Ryan [name] is a 62 y.o. male with medical history significant of HTN, DM2, A-fib, CKD3, Substance use presenting with worsening shortness of breath.  Patient reports some shortness of breath with some weakness for the past several days.  Also experiencing some headache and intermittent chest pain.  Reports he has been taking all medications as prescribed since discharge after being admitted with atrial fibrillation in May.  Denies fevers, chills, abdominal pain, constipation, diarrhea, nausea, vomiting. Denies sick contacts.  ED Course: Vital signs in the ED notable blood pressure in the 110s to 130s systolic, heart rate in the 100s to 160s, respiratory rate in the teens to 20s.  Lab workup included BMP with potassium 3.4, bicarb 19, creatinine stable 1.55, gap 16, glucose 250, magnesium pending.  CBC within normal limits.  Troponin mildly elevated at 27 with repeat pending.  BNP mildly elevated at 395.  COVID 19 screening positive.  Chest x-ray showed low lung volumes with no focal opacity.  Patient started on diltiazem drip in the ED.  Review of Systems: As per HPI otherwise all other systems reviewed and are negative.  Past Medical History:  Diagnosis Date   Achilles tendon rupture right   Arthritis    back and shoulders   Atrial flutter with rapid ventricular response (HCC) 07/22/2020   Chest pain    a. 05/2011 - admit w/ c/p in setting of cocaine-->Cath:  nl cors, nl EF, no vasospasm noted.   CKD (chronic kidney disease), stage III (HCC)    COVID-19 virus infection 05/31/2019   Diabetes mellitus without complication (HCC)    Drug abuse (HCC)    cocaine, marijuana abuse   GERD (gastroesophageal reflux disease)    History of echocardiogram    a. 07/25/20 Echo: EF 65-70%, no  rwma, mild conc LVH, nl RV fxn, mildly dil LA.   Hypertension     Past Surgical History:  Procedure Laterality Date   ACHILLES TENDON SURGERY Right 05/02/2014   Procedure: RIGHT ACHILLES TENDON REPAIR;  Surgeon: Javier Docker, MD;  Location: [facility] ORS;  Service: Orthopedics;  Laterality: Right;   BIOPSY  07/25/2020   Procedure: BIOPSY;  Surgeon: Kathi Der, MD;  Location: [facility] ENDOSCOPY;  Service: Gastroenterology;;   CARDIAC CATHETERIZATION  05.31.2012   showing patent coronaries with no visualized vasospasm and EF of 55-60 %   ESOPHAGOGASTRODUODENOSCOPY N/A 07/25/2020   Procedure: ESOPHAGOGASTRODUODENOSCOPY (EGD);  Surgeon: Kathi Der, MD;  Location: [facility] ENDOSCOPY;  Service: Gastroenterology;  Laterality: N/A;   ORBITAL FRACTURE SURGERY Left 2-3 yrs ago    Social History  reports that he has never smoked. He has never used smokeless tobacco. He reports that he does not currently use alcohol. He reports that he does not currently use drugs after having used the following drugs: Cocaine and Marijuana.  No Known Allergies  Family History  Problem Relation Age of Onset   Unexplained death Mother 14       She died in 25-Jul-2016, he does not remember what she died of   Unexplained death Father        He has no information on his father  Reviewed on admission  Prior to Admission medications   Medication Sig Start Date End Date Taking? Authorizing Provider  apixaban (ELIQUIS) 5 MG TABS tablet Take 1 tablet (5  mg total) by mouth 2 (two) times daily. 04/26/23 07/25/23  Darlin Drop, DO  carvedilol (COREG) 3.125 MG tablet Take 1 tablet (3.125 mg total) by mouth 2 (two) times daily. 04/26/23 05/26/23  Darlin Drop, DO  diltiazem (CARDIZEM CD) 240 MG 24 hr capsule Take 1 capsule (240 mg total) by mouth daily. 04/26/23   Darlin Drop, DO  glipiZIDE (GLUCOTROL) 10 MG tablet Take 10 mg by mouth daily. 03/04/21   [provider]  insulin NPH-regular Human (NOVOLIN 70/30) (70-30) 100  UNIT/ML injection Inject 35 units into the skin 2 times daily with a meal. Patient taking differently: Inject 45 Units into the skin 2 (two) times daily with a meal. 07/25/20   Marjie Skiff E, PA-C  losartan (COZAAR) 50 MG tablet Take 1 tablet (50 mg total) by mouth daily. 04/26/23 05/26/23  Darlin Drop, DO  omeprazole (PRILOSEC) 40 MG capsule Take 40 mg by mouth daily. 01/07/21   [provider]  promethazine-dextromethorphan (PROMETHAZINE-DM) 6.25-15 MG/5ML syrup Take 5 mLs by mouth 4 (four) times daily as needed for cough. 04/11/23   Rising, Lurena Joiner, PA-C  ZYRTEC ALLERGY 10 MG tablet Take 10 mg by mouth in the morning. 03/03/20   [provider]    Physical Exam: Vitals:   07/13/23 1200 07/13/23 1205 07/13/23 1230 07/13/23 1245  BP: (!) 140/112 (!) 163/84 (!) 135/109 (!) 139/102  Pulse: (!) 51  (!) 158 (!) 160  Resp: 19 18 (!) 23 (!) 28  Temp:      TempSrc:      SpO2: 97%  97% 97%  Weight:      Height:        Physical Exam Constitutional:      General: He is not in acute distress.    Appearance: Normal appearance.  HENT:     Head: Normocephalic and atraumatic.     Mouth/Throat:     Mouth: Mucous membranes are moist.     Pharynx: Oropharynx is clear.  Eyes:     Extraocular Movements: Extraocular movements intact.     Pupils: Pupils are equal, round, and reactive to light.  Cardiovascular:     Rate and Rhythm: Normal rate. Rhythm irregular.     Pulses: Normal pulses.     Heart sounds: Normal heart sounds.  Pulmonary:     Effort: Pulmonary effort is normal. No respiratory distress.     Breath sounds: Normal breath sounds.  Abdominal:     General: Bowel sounds are normal. There is no distension.     Palpations: Abdomen is soft.     Tenderness: There is no abdominal tenderness.  Musculoskeletal:        General: No swelling or deformity.     Right lower leg: Edema present.     Left lower leg: Edema present.  Skin:    General: Skin is warm and dry.   Neurological:     General: No focal deficit present.     Mental Status: Mental status is at baseline.    Labs on Admission: I have personally reviewed following labs and imaging studies  CBC: Recent Labs  Lab 07/13/23 1108  WBC 7.8  HGB 14.2  HCT 45.2  MCV 81.6  PLT 262    Basic Metabolic Panel: Recent Labs  Lab 07/13/23 1108 07/13/23 1112  NA 138  --   K 3.4*  --   CL 103  --   CO2 19*  --   GLUCOSE 250*  --  BUN 13  --   CREATININE 1.55*  --   CALCIUM 8.9  --   MG  --  1.9    GFR: Estimated Creatinine Clearance: 62.4 mL/min (A) (by C-G formula based on SCr of 1.55 mg/dL (H)).  Liver Function Tests: No results for input(s): "AST", "ALT", "ALKPHOS", "BILITOT", "PROT", "ALBUMIN" in the last 168 hours.  Urine analysis:    Component Value Date/Time   COLORURINE YELLOW 07/26/2022 1445   APPEARANCEUR CLEAR 07/26/2022 1445   LABSPEC 1.021 07/26/2022 1445   PHURINE 5.0 07/26/2022 1445   GLUCOSEU NEGATIVE 07/26/2022 1445   HGBUR NEGATIVE 07/26/2022 1445   BILIRUBINUR NEGATIVE 07/26/2022 1445   KETONESUR NEGATIVE 07/26/2022 1445   PROTEINUR 30 (A) 07/26/2022 1445   UROBILINOGEN 1.0 11/06/2013 1612   NITRITE NEGATIVE 07/26/2022 1445   LEUKOCYTESUR NEGATIVE 07/26/2022 1445    Radiological Exams on Admission: DG Chest 1 View  Result Date: 07/13/2023 CLINICAL DATA:  141880 SOB (shortness of breath) 141880 EXAM: CHEST  1 VIEW COMPARISON:  CXR 05/24/23 FINDINGS: No pleural effusion. No pneumothorax. No focal airspace opacity. Low lung volumes. Unchanged cardiac and mediastinal contours. No radiographically apparent displaced rib fractures. Visualized upper abdomen is unremarkable. IMPRESSION: Low lung volumes.  No focal airspace opacity. Electronically Signed   By: Lorenza Cambridge M.D.   On: 07/13/2023 12:07    EKG: Independently reviewed.  Atrial flutter with RVR at 160 bpm.  Nonspecific T wave changes.  Assessment/Plan Principal Problem:   Atrial fibrillation with  RVR (HCC) Active Problems:   Uncontrolled hypertension   Diabetes mellitus type 2, insulin dependent (HCC)   CKD (chronic kidney disease), stage III (HCC)   Cocaine abuse (HCC)   Atrial flutter with RVR > Presenting with shortness of breath, weakness, intermittent chest pain. > Found to be in a flutter with RVR into the 160s. > Recently seen for RVR in May and has been taking his prescribed carvedilol, diltiazem, Eliquis. > Tested positive for COVID-19 as below.  Possible trigger. No significant abnormality on ECHO in May. > Placed on diltiazem drip in the ED which has been titrated up rate improved. - Monitor on progressive unit - Continue with diltiazem drip - Resume home carvedilol - Plan to resume home diltiazem tomorrow morning - Continue Eliquis, will likely need refill at discharge - Supportive care - Consult to [facility] for PCP (has seen cards outpatient at least)  COVID-19 infection > Symptoms began several days ago, but patient declines further treatment. He finds it hard to believe he has Covid. - Continue to monitor  Hypertension - Continue home carvedilol, losartan - On diltiazem drip plan to resume home diltiazem tomorrow  Diabetes > On 70/30 insulin at home. - Continue 70/30 insulin at 25 units twice daily - SSI  CKD 3A Hypokalemia > Creatinine stable at 1.55 in the ED.  Mildly low potassium 3.4.  Magnesium pending. - 40 mEq p.o. potassium - Trend renal function and electrolytes - Follow-up magnesium  History of substance use - Has continued to abstain    DVT prophylaxis: Eliquis  Code Status:   Full Family Communication:  None on admission  Disposition Plan:   Patient is from:  Home  Anticipated DC to:  Home  Anticipated DC date:  1 to 3 days  Anticipated DC barriers: None  Consults called:  None Admission status:  Observation, progressive  Severity of Illness: The appropriate patient status for this patient is OBSERVATION. Observation status is  judged to be reasonable and necessary in  order to provide the required intensity of service to ensure the patient's safety. The patient's presenting symptoms, physical exam findings, and initial radiographic and laboratory data in the context of their medical condition is felt to place them at decreased risk for further clinical deterioration. Furthermore, it is anticipated that the patient will be medically stable for discharge from the hospital within 2 midnights of admission.    Synetta Fail MD [facility]  How to contact the [facility] Attending or Consulting provider 7A - 7P or covering provider during after hours 7P -7A, for this patient?   Check the care team in [facility] and look for a) attending/consulting [facility] provider listed and b) the [facility] team listed Log into [email or web address] and use [place]'s universal password to access. If you do not have the password, please contact the hospital operator. Locate the [facility] provider you are looking for under [facility] and page to a number that you can be directly reached. If you still have difficulty reaching the provider, please page the [facility] (Director on Call) for the Hospitalists listed on amion for assistance.  07/13/2023, 1:16 PM

## 2023-07-13 NOTE — ED Notes (Signed)
Provided patient with blanket.

## 2023-07-13 NOTE — ED Notes (Signed)
ED TO INPATIENT HANDOFF REPORT  ED Nurse Name and Phone #:   S Name/Age/Gender Ryan [name] 62 y.o. male Room/Bed: 010C/010C  Code Status   Code Status: Full Code  Home/SNF/Other Home Patient oriented to: self, place, time, and situation Is this baseline? Yes   Triage Complete: Triage complete  Chief Complaint Atrial fibrillation with RVR (HCC) [I48.91]  Triage Note Pt c/o SOB, HA and generalized weakness started today. Pt is a little tachypneic. Pt c/o mid to left sided chest pain   Allergies No Known Allergies  Level of Care/Admitting Diagnosis ED Disposition     ED Disposition  Admit   Condition  --   Comment  Hospital Area: [facility] [[id number]]  Level of Care: Progressive [102]  Admit to Progressive based on following criteria: CARDIOVASCULAR & THORACIC of moderate stability with acute coronary syndrome symptoms/low risk myocardial infarction/hypertensive urgency/arrhythmias/heart failure potentially compromising stability and stable post cardiovascular intervention patients.  May place patient in observation at [facility] or [facility] if equivalent level of care is available:: No  Covid Evaluation: Asymptomatic - no recent exposure (last 10 days) testing not required  Diagnosis: Atrial fibrillation with RVR [facility]) [[id number]]  Admitting Physician: Synetta Fail [[id number]]  Attending Physician: Synetta Fail [[id number]]          B Medical/Surgery History Past Medical History:  Diagnosis Date   Achilles tendon rupture right   Arthritis    back and shoulders   Atrial flutter with rapid ventricular response (HCC) 07/22/2020   Chest pain    a. 05/2011 - admit w/ c/p in setting of cocaine-->Cath:  nl cors, nl EF, no vasospasm noted.   CKD (chronic kidney disease), stage III (HCC)    COVID-19 virus infection 05/31/2019   Diabetes mellitus without complication (HCC)    Drug abuse (HCC)    cocaine, marijuana abuse   GERD  (gastroesophageal reflux disease)    History of echocardiogram    a. 07/2020 Echo: EF 65-70%, no rwma, mild conc LVH, nl RV fxn, mildly dil LA.   Hypertension    Past Surgical History:  Procedure Laterality Date   ACHILLES TENDON SURGERY Right 05/02/2014   Procedure: RIGHT ACHILLES TENDON REPAIR;  Surgeon: Javier Docker, MD;  Location: [facility] ORS;  Service: Orthopedics;  Laterality: Right;   BIOPSY  07/25/2020   Procedure: BIOPSY;  Surgeon: Kathi Der, MD;  Location: [facility] ENDOSCOPY;  Service: Gastroenterology;;   CARDIAC CATHETERIZATION  05.31.2012   showing patent coronaries with no visualized vasospasm and EF of 55-60 %   ESOPHAGOGASTRODUODENOSCOPY N/A 07/25/2020   Procedure: ESOPHAGOGASTRODUODENOSCOPY (EGD);  Surgeon: Kathi Der, MD;  Location: [facility] ENDOSCOPY;  Service: Gastroenterology;  Laterality: N/A;   ORBITAL FRACTURE SURGERY Left 2-3 yrs ago     A IV Location/Drains/Wounds Patient Lines/Drains/Airways Status     Active Line/Drains/Airways     Name Placement date Placement time Site Days   Peripheral IV 07/13/23 18 G Anterior;Left Forearm 07/13/23  1138  Forearm  less than 1            Intake/Output Last 24 hours No intake or output data in the 24 hours ending 07/13/23 1533  Labs/Imaging Results for orders placed or performed during the hospital encounter of 07/13/23 (from the past 48 hour(s))  SARS Coronavirus 2 by RT PCR (hospital order, performed in [facility] hospital lab) *cepheid single result test* Anterior Nasal Swab     Status: Abnormal   Collection Time: 07/13/23 10:55 AM  Specimen: Anterior Nasal Swab  Result Value Ref Range   SARS Coronavirus 2 by RT PCR POSITIVE (A) NEGATIVE    Comment: Performed at [facility] Lab, [address] [place], [place] [address]  Brain natriuretic peptide     Status: Abnormal   Collection Time: 07/13/23 11:03 AM  Result Value Ref Range   B Natriuretic Peptide 395.5 (H) 0.0 - 100.0 pg/mL    Comment: Performed  at [facility] Lab, [address], [place], [place] [address]  Basic metabolic panel     Status: Abnormal   Collection Time: 07/13/23 11:08 AM  Result Value Ref Range   Sodium 138 135 - 145 mmol/L   Potassium 3.4 (L) 3.5 - 5.1 mmol/L   Chloride 103 98 - 111 mmol/L   CO2 19 (L) 22 - 32 mmol/L   Glucose, Bld 250 (H) 70 - 99 mg/dL    Comment: Glucose reference range applies only to samples taken after fasting for at least 8 hours.   BUN 13 6 - 20 mg/dL   Creatinine, Ser 8.46 (H) 0.61 - 1.24 mg/dL   Calcium 8.9 8.9 - 96.2 mg/dL   GFR, Estimated 51 (L) >60 mL/min    Comment: (NOTE) Calculated using the CKD-EPI Creatinine Equation (2021)    Anion gap 16 (H) 5 - 15    Comment: Performed at [facility] Lab, [address] [place], [place] [address]  CBC     Status: Abnormal   Collection Time: 07/13/23 11:08 AM  Result Value Ref Range   WBC 7.8 4.0 - 10.5 K/uL   RBC 5.54 4.22 - 5.81 MIL/uL   Hemoglobin 14.2 13.0 - 17.0 g/dL   HCT 13.2 44.0 - 10.2 %   MCV 81.6 80.0 - 100.0 fL   MCH 25.6 (L) 26.0 - 34.0 pg   MCHC 31.4 30.0 - 36.0 g/dL   RDW 72.5 36.6 - 44.0 %   Platelets 262 150 - 400 K/uL   nRBC 0.0 0.0 - 0.2 %    Comment: Performed at [facility] Lab, [address], [place], [place] [address]  Troponin I (High Sensitivity)     Status: Abnormal   Collection Time: 07/13/23 11:08 AM  Result Value Ref Range   Troponin I (High Sensitivity) 27 (H) <18 ng/L    Comment: (NOTE) Elevated high sensitivity troponin I (hsTnI) values and significant  changes across serial measurements may suggest ACS but many other  chronic and acute conditions are known to elevate hsTnI results.  Refer to the "Links" section for chest pain algorithms and additional  guidance. Performed at [facility] Lab, [address], [place], [place] [address]   Magnesium     Status: None   Collection Time: 07/13/23 11:12 AM  Result Value Ref Range   Magnesium 1.9 1.7 - 2.4 mg/dL    Comment: Performed  at [facility] Lab, [address] [place], [place] [address]  Troponin I (High Sensitivity)     Status: Abnormal   Collection Time: 07/13/23 12:55 PM  Result Value Ref Range   Troponin I (High Sensitivity) 22 (H) <18 ng/L    Comment: (NOTE) Elevated high sensitivity troponin I (hsTnI) values and significant  changes across serial measurements may suggest ACS but many other  chronic and acute conditions are known to elevate hsTnI results.  Refer to the "Links" section for chest pain algorithms and additional  guidance. Performed at [facility] Lab, [address],  [place], [place] [address]    DG Chest 1 View  Result Date: 07/13/2023 CLINICAL DATA:  141880 SOB (shortness of breath) 141880 EXAM: CHEST  1 VIEW COMPARISON:  CXR 05/24/23 FINDINGS: No pleural effusion. No pneumothorax. No focal airspace opacity. Low lung volumes. Unchanged cardiac and mediastinal contours. No radiographically apparent displaced rib fractures. Visualized upper abdomen is unremarkable. IMPRESSION: Low lung volumes.  No focal airspace opacity. Electronically Signed   By: Lorenza Cambridge M.D.   On: 07/13/2023 12:07    Pending Labs Unresulted Labs (From admission, onward)     Start     Ordered   07/14/23 0500  Comprehensive metabolic panel  Tomorrow morning,   R        07/13/23 1311   07/14/23 0500  CBC  Tomorrow morning,   R        07/13/23 1311            Vitals/Pain Today's Vitals   07/13/23 1415 07/13/23 1430 07/13/23 1445 07/13/23 1457  BP: (!) 143/84 (!) 159/100 (!) 159/94   Pulse: 86 86 83   Resp: (!) 31 19 (!) 21   Temp:    98 F (36.7 C)  TempSrc:      SpO2: 96% 94% 98%   Weight:      Height:      PainSc:        Isolation Precautions Airborne precautions  Medications Medications  diltiazem (CARDIZEM) 1 mg/mL load via infusion 20 mg (20 mg Intravenous Bolus from Bag 07/13/23 1140)    And  diltiazem (CARDIZEM) 125 mg in dextrose 5% 125 mL (1 mg/mL) infusion (10 mg/hr  Intravenous Rate/Dose Change 07/13/23 1257)  losartan (COZAAR) tablet 50 mg (50 mg Oral Given 07/13/23 1430)  carvedilol (COREG) tablet 3.125 mg (has no administration in time range)  diltiazem (CARDIZEM CD) 24 hr capsule 240 mg (has no administration in time range)  pantoprazole (PROTONIX) EC tablet 40 mg (has no administration in time range)  apixaban (ELIQUIS) tablet 5 mg (has no administration in time range)  sodium chloride flush (NS) 0.9 % injection 3 mL (3 mLs Intravenous Given 07/13/23 1335)  acetaminophen (TYLENOL) tablet 650 mg (has no administration in time range)    Or  acetaminophen (TYLENOL) suppository 650 mg (has no administration in time range)  polyethylene glycol (MIRALAX / GLYCOLAX) packet 17 g (has no administration in time range)  insulin aspart (novoLOG) injection 0-9 Units (has no administration in time range)  insulin aspart protamine- aspart (NOVOLOG MIX 70/30) injection 25 Units (has no administration in time range)  potassium chloride SA (KLOR-CON M) CR tablet 40 mEq (has no administration in time range)  famotidine (PEPCID) tablet 20 mg (20 mg Oral Given 07/13/23 1430)    Mobility walks     Focused Assessments Cardiac Assessment Handoff:  Cardiac Rhythm: Sinus tachycardia Lab Results  Component Value Date   CKTOTAL 122 04/26/2023   CKMB 3.0 05/13/2011   TROPONINI <0.03 06/01/2019   Lab Results  Component Value Date   DDIMER 1.35 (H) 06/01/2019   Does the Patient currently have chest pain? No    R Recommendations: See Admitting Provider Note  Report given to:   Additional Notes:

## 2023-07-13 NOTE — ED Triage Notes (Addendum)
Pt c/o SOB, HA and generalized weakness started today. Pt is a little tachypneic. Pt c/o mid to left sided chest pain

## 2023-07-14 DIAGNOSIS — I4891 Unspecified atrial fibrillation: Secondary | ICD-10-CM | POA: Diagnosis not present

## 2023-07-14 LAB — GLUCOSE, CAPILLARY
Glucose-Capillary: 126 mg/dL — ABNORMAL HIGH (ref 70–99)
Glucose-Capillary: 49 mg/dL — ABNORMAL LOW (ref 70–99)
Glucose-Capillary: 194 mg/dL — ABNORMAL HIGH (ref 70–99)
Glucose-Capillary: 180 mg/dL — ABNORMAL HIGH (ref 70–99)

## 2023-07-14 MED ORDER — POTASSIUM CHLORIDE CRYS ER 20 MEQ PO TBCR
40.0000 meq | EXTENDED_RELEASE_TABLET | ORAL | Status: AC
  Administered 2023-07-14 (×2): 40 meq via ORAL
  Filled 2023-07-14 (×2): qty 2

## 2023-07-14 NOTE — TOC Transition Note (Signed)
Transition of Care (TOC) - CM/SW Discharge Note Donn Pierini RN, BSN Transitions of Care Unit 4E- RN Case Manager See Treatment Team for direct phone #   Patient Details  Name: Ryan [name] MRN: [id number] Date of Birth: 11/10/1961  Transition of Care [facility]) CM/SW Contact:  Darrold Span, RN Phone Number: 07/14/2023, 2:10 PM   Clinical Narrative:    Pt stable for transition home later today, referral received for PCP needs- pt has requested f/u with IM clinic.   Appointment has been made for 8/19 with IM clinic.   No further TOC needs noted.    Final next level of care: Home/Self Care Barriers to Discharge: No Barriers Identified   Patient Goals and CMS Choice   Choice offered to / list presented to : NA  Discharge Placement               Home          Discharge Plan and Services Additional resources added to the After Visit Summary for   In-house Referral: NA Discharge Planning Services: Follow-up appt scheduled Post Acute Care Choice: NA          DME Arranged: N/A DME Agency: NA       HH Arranged: NA HH Agency: NA        Social Determinants of Health (SDOH) Interventions SDOH Screenings   Food Insecurity: No Food Insecurity (07/13/2023)  Housing: Low Risk  (07/13/2023)  Transportation Needs: No Transportation Needs (07/13/2023)  Utilities: Not At Risk (07/13/2023)  Tobacco Use: Low Risk  (07/13/2023)     Readmission Risk Interventions     No data to display

## 2023-07-14 NOTE — Discharge Summary (Signed)
Physician Discharge Summary  Ahmer Whetstone YQI:[id number] DOB: 11/27/1961 DOA: 07/13/2023  PCP: Lavinia Sharps, NP  Admit date: 07/13/2023 Discharge date: 07/14/2023    Admitted From: Home Disposition:  Home  Recommendations for Outpatient Follow-up:  Follow up with PCP in 1-2 weeks Please obtain BMP/CBC in one week Please follow up with your PCP on the following pending results: Unresulted Labs (From admission, onward)    None         Home Health: None Equipment/Devices: None  Discharge Condition: Stable CODE STATUS: Full code Diet recommendation: Cardiac  HPI: Ryan [name] is a 62 y.o. male with medical history significant of HTN, DM2, A-fib, CKD3, Substance use presenting with worsening shortness of breath.   Patient reports some shortness of breath with some weakness for the past several days.  Also experiencing some headache and intermittent chest pain.  Reports he has been taking all medications as prescribed since discharge after being admitted with atrial fibrillation in May.   Denies fevers, chills, abdominal pain, constipation, diarrhea, nausea, vomiting. Denies sick contacts.   ED Course: Vital signs in the ED notable blood pressure in the 110s to 130s systolic, heart rate in the 100s to 160s, respiratory rate in the teens to 20s.  Lab workup included BMP with potassium 3.4, bicarb 19, creatinine stable 1.55, gap 16, glucose 250, magnesium pending.  CBC within normal limits.  Troponin mildly elevated at 27 with repeat pending.  BNP mildly elevated at 395.  COVID 19 screening positive.  Chest x-ray showed low lung volumes with no focal opacity.  Patient started on diltiazem drip in the ED.  Subjective: Seen and examined.  Feels better.  No chest pain, palpitation or shortness of breath.  Brief/Interim Summary: Patient was mainly admitted for atrial fibrillation with RVR, his heart rate was between 120-160.  He was started on Cardizem drip which  was stopped and he was transitioned back to his home oral beta-blocker and calcium channel blockers soon after and his rates have remained stable.  Eliquis was resumed.  He was also tested positive for COVID-19 infection but chest x-ray was negative.  He was not hypoxic.  Due to him being on Eliquis, Paxlovid is contraindicated.  And since he is not hypoxic and does not have any chest x-ray findings, he does not meet criteria for remdesivir either.  He is otherwise medically and hemodynamically stable.  CKD 3 AA at baseline.  Potassium 3.0, he will be replaced 80 mEq today before discharge.  He is being discharged in stable condition.  Discharge plan was discussed with patient and/or family member and they verbalized understanding and agreed with it.  Discharge Diagnoses:  Principal Problem:   Atrial fibrillation with RVR (HCC) Active Problems:   Uncontrolled hypertension   Diabetes mellitus type 2, insulin dependent (HCC)   CKD (chronic kidney disease), stage III (HCC)   Cocaine abuse (HCC)    Discharge Instructions   Allergies as of 07/14/2023   No Known Allergies      Medication List     TAKE these medications    carvedilol 3.125 MG tablet Commonly known as: Coreg Take 1 tablet (3.125 mg total) by mouth 2 (two) times daily.   diltiazem 240 MG 24 hr capsule Commonly known as: CARDIZEM CD Take 1 capsule (240 mg total) by mouth daily.   Eliquis 5 MG Tabs tablet Generic drug: apixaban Take 1 tablet (5 mg total) by mouth 2 (two) times daily.   glipiZIDE 10 MG tablet  Commonly known as: GLUCOTROL Take 10 mg by mouth daily.   losartan 50 MG tablet Commonly known as: COZAAR Take 1 tablet (50 mg total) by mouth daily.   NovoLIN 70/30 (70-30) 100 UNIT/ML injection Generic drug: insulin NPH-regular Human Inject 35 units into the skin 2 times daily with a meal. What changed:  how much to take how to take this when to take this additional instructions   omeprazole 40 MG  capsule Commonly known as: PRILOSEC Take 40 mg by mouth daily.   promethazine-dextromethorphan 6.25-15 MG/5ML syrup Commonly known as: PROMETHAZINE-DM Take 5 mLs by mouth 4 (four) times daily as needed for cough.   ZyrTEC Allergy 10 MG tablet Generic drug: cetirizine Take 10 mg by mouth in the morning.        Follow-up Information     Placey, Chales Abrahams, NP Follow up in 1 week(s).   Contact information: [address] [place] [address] [phone]                No Known Allergies  Consultations: None   Procedures/Studies: DG Chest 1 View  Result Date: 07/13/2023 CLINICAL DATA:  141880 SOB (shortness of breath) 141880 EXAM: CHEST  1 VIEW COMPARISON:  CXR 05/24/23 FINDINGS: No pleural effusion. No pneumothorax. No focal airspace opacity. Low lung volumes. Unchanged cardiac and mediastinal contours. No radiographically apparent displaced rib fractures. Visualized upper abdomen is unremarkable. IMPRESSION: Low lung volumes.  No focal airspace opacity. Electronically Signed   By: Lorenza Cambridge M.D.   On: 07/13/2023 12:07     Discharge Exam: Vitals:   07/14/23 0622 07/14/23 0743  BP: (!) 139/96 (!) 152/86  Pulse: 71 67  Resp: 18 (!) 22  Temp: 98 F (36.7 C) 98 F (36.7 C)  SpO2: 98% 96%   Vitals:   07/14/23 0200 07/14/23 0250 07/14/23 0622 07/14/23 0743  BP:   (!) 139/96 (!) 152/86  Pulse: 68  71 67  Resp: 20  18 (!) 22  Temp:   98 F (36.7 C) 98 F (36.7 C)  TempSrc:   Oral Oral  SpO2: 96%  98% 96%  Weight:  101 kg    Height:        General: Pt is alert, awake, not in acute distress Cardiovascular: RRR, S1/S2 +, no rubs, no gallops Respiratory: CTA bilaterally, no wheezing, no rhonchi Abdominal: Soft, NT, ND, bowel sounds + Extremities: no edema, no cyanosis    The results of significant diagnostics from this hospitalization (including imaging, microbiology, ancillary and laboratory) are listed below for reference.      Microbiology: Recent Results (from the past 240 hour(s))  SARS Coronavirus 2 by RT PCR (hospital order, performed in [facility] hospital lab) *cepheid single result test* Anterior Nasal Swab     Status: Abnormal   Collection Time: 07/13/23 10:55 AM   Specimen: Anterior Nasal Swab  Result Value Ref Range Status   SARS Coronavirus 2 by RT PCR POSITIVE (A) NEGATIVE Final    Comment: Performed at [facility], [address] [place], [place] [address]     Labs: BNP (last 3 results) Recent Labs    05/13/23 1527 05/24/23 1505 07/13/23 1103  BNP 259.0* 382.8* 395.5*   Basic Metabolic Panel: Recent Labs  Lab 07/13/23 1108 07/13/23 1112 07/14/23 0244  NA 138  --  135  K 3.4*  --  3.0*  CL 103  --  106  CO2 19*  --  21*  GLUCOSE 250*  --  70  BUN 13  --  15  CREATININE 1.55*  --  1.49*  CALCIUM 8.9  --  8.4*  MG  --  1.9  --    Liver Function Tests: Recent Labs  Lab 07/14/23 0244  AST 16  ALT 16  ALKPHOS 66  BILITOT 0.9  PROT 6.3*  ALBUMIN 2.9*   No results for input(s): "LIPASE", "AMYLASE" in the last 168 hours. No results for input(s): "AMMONIA" in the last 168 hours. CBC: Recent Labs  Lab 07/13/23 1108 07/14/23 0244  WBC 7.8 5.9  HGB 14.2 12.0*  HCT 45.2 37.7*  MCV 81.6 80.9  PLT 262 246   Cardiac Enzymes: No results for input(s): "CKTOTAL", "CKMB", "CKMBINDEX", "TROPONINI" in the last 168 hours. BNP: Invalid input(s): "POCBNP" CBG: Recent Labs  Lab 07/13/23 1748 07/13/23 2122 07/14/23 0625  GLUCAP 165* 169* 126*   D-Dimer No results for input(s): "DDIMER" in the last 72 hours. Hgb A1c No results for input(s): "HGBA1C" in the last 72 hours. Lipid Profile No results for input(s): "CHOL", "HDL", "LDLCALC", "TRIG", "CHOLHDL", "LDLDIRECT" in the last 72 hours. Thyroid function studies No results for input(s): "TSH", "T4TOTAL", "T3FREE", "THYROIDAB" in the last 72 hours.  Invalid input(s): "FREET3" Anemia work up No results for  input(s): "VITAMINB12", "FOLATE", "FERRITIN", "TIBC", "IRON", "RETICCTPCT" in the last 72 hours. Urinalysis    Component Value Date/Time   COLORURINE YELLOW 07/26/2022 1445   APPEARANCEUR CLEAR 07/26/2022 1445   LABSPEC 1.021 07/26/2022 1445   PHURINE 5.0 07/26/2022 1445   GLUCOSEU NEGATIVE 07/26/2022 1445   HGBUR NEGATIVE 07/26/2022 1445   BILIRUBINUR NEGATIVE 07/26/2022 1445   KETONESUR NEGATIVE 07/26/2022 1445   PROTEINUR 30 (A) 07/26/2022 1445   UROBILINOGEN 1.0 11/06/2013 1612   NITRITE NEGATIVE 07/26/2022 1445   LEUKOCYTESUR NEGATIVE 07/26/2022 1445   Sepsis Labs Recent Labs  Lab 07/13/23 1108 07/14/23 0244  WBC 7.8 5.9   Microbiology Recent Results (from the past 240 hour(s))  SARS Coronavirus 2 by RT PCR (hospital order, performed in [facility] hospital lab) *cepheid single result test* Anterior Nasal Swab     Status: Abnormal   Collection Time: 07/13/23 10:55 AM   Specimen: Anterior Nasal Swab  Result Value Ref Range Status   SARS Coronavirus 2 by RT PCR POSITIVE (A) NEGATIVE Final    Comment: Performed at [facility], [address] [place], [place] [address]    FURTHER DISCHARGE INSTRUCTIONS:   Get Medicines reviewed and adjusted: Please take all your medications with you for your next visit with your Primary MD   Laboratory/radiological data: Please request your Primary MD to go over all hospital tests and procedure/radiological results at the follow up, please ask your Primary MD to get all Hospital records sent to his/her office.   In some cases, they will be blood work, cultures and biopsy results pending at the time of your discharge. Please request that your primary care M.D. goes through all the records of your hospital data and follows up on these results.   Also Note the following: If you experience worsening of your admission symptoms, develop shortness of breath, life threatening emergency, suicidal or homicidal thoughts you must seek  medical attention immediately by calling 911 or calling your MD immediately  if symptoms less severe.   You must read complete instructions/literature along with all the possible adverse reactions/side effects for all the Medicines you take and that have been prescribed to you. Take any new Medicines after you have completely  understood and accpet all the possible adverse reactions/side effects.    Do not drive when taking Pain medications or sleeping medications (Benzodaizepines)   Do not take more than prescribed Pain, Sleep and Anxiety Medications. It is not advisable to combine anxiety,sleep and pain medications without talking with your primary care practitioner   Special Instructions: If you have smoked or chewed Tobacco  in the last 2 yrs please stop smoking, stop any regular Alcohol  and or any Recreational drug use.   Wear Seat belts while driving.   Please note: You were cared for by a hospitalist during your hospital stay. Once you are discharged, your primary care physician will handle any further medical issues. Please note that NO REFILLS for any discharge medications will be authorized once you are discharged, as it is imperative that you return to your primary care physician (or establish a relationship with a primary care physician if you do not have one) for your post hospital discharge needs so that they can reassess your need for medications and monitor your lab values  Time coordinating discharge: Over 30 minutes  SIGNED:   Hughie Closs, MD  [facility] 07/14/2023, 10:45 AM *Please note that this is a verbal dictation therefore any spelling or grammatical errors are due to the "Dragon [employer] One" system interpretation. If 7PM-7AM, please contact night-coverage [email or web address]

## 2023-07-14 NOTE — Progress Notes (Signed)
Pt is transferred from ED to 4E22. He is alert and and fully oriented x 4, stable hemodynamically, afebrile, denies SOB or chest pain. EKG is NSR with frequent PACs, HR 80s. Pt is under air borne isolation due to COVID positive. Pt has not presented any respiratory symptoms, on room air, SPO2 97-98% with normal respiratory effort.  No acute distress at arrival. We will continue to  monitor.  Filiberto Pinks, RN

## 2023-07-14 NOTE — Progress Notes (Signed)
Hypoglycemic Event  CBG: 49  Treatment: 8 oz juice/soda  Symptoms: Sweaty  Follow-up CBG: Time:1300 CBG Result:194  Possible Reasons for Event: Unknown  Comments/MD notified:  still ok to DC once blood sugar stable    Ethlyn Gallery, Olga Coaster

## 2023-07-17 NOTE — Progress Notes (Deleted)
  Electrophysiology Office Note:    Date:  07/17/2023   ID:  Ryan [name], Ryan [name] 09/06/1961, MRN [id number]  [facility] Cardiologist:  Chrystie Nose, MD  [facility] Electrophysiologist:  Lanier Prude, MD   Referring MD: Corrin Parker, PA-C   Chief Complaint: Atrial flutter  History of Present Illness:    Ryan [name] is a 62 y.o. malewho I am seeing today for an evaluation of atrial flutter at the request of Rosalyn Gess, PA-C.  The patient has a medical history that includes hypertension, diabetes, atrial fibrillation and flutter, CKD 3, substance use.  He was hospitalized on July 13, 2023 with shortness of breath for several days.  His heart rates at the time of initial evaluation were in the 100/160 range.  He was started on a diltiazem drip in the emergency department for rate control.  He was seen in May of this year as well for atrial flutter with rapid ventricular rates.  He is on Eliquis for stroke prophylaxis.  He was found to be COVID-positive during that hospitalization.  He was discharged August 1.          Their past medical, social and family history was reveiwed.   ROS:   Please see the history of present illness.    All other systems reviewed and are negative.  EKGs/Labs/Other Studies Reviewed:    The following studies were reviewed today:  Apr 25, 2023 EKG shows atrial flutter with 2-1 conduction May 13, 2023 EKG shows sinus rhythm July 13, 2023 EKG shows atrial flutter with 2 1 AV conduction      Physical Exam:    VS:  There were no vitals taken for this visit.    Wt Readings from Last 3 Encounters:  07/14/23 222 lb 10.6 oz (101 kg)  05/24/23 240 lb 4.8 oz (109 kg)  05/13/23 242 lb 3.2 oz (109.9 kg)     GEN: *** Well nourished, well developed in no acute distress CARDIAC: ***RRR, no murmurs, rubs, gallops RESPIRATORY:  Clear to auscultation without rales, wheezing or rhonchi       ASSESSMENT AND  PLAN:    No diagnosis found.  #Atrial flutter Typical appearing.  No evidence of atrial fibrillation on available EKGs On Eliquis for stroke prophylaxis        Signed, Sheria Lang T. Lalla Brothers, MD, [facility], [facility] 07/17/2023 6:22 PM    Electrophysiology [facility]

## 2023-07-18 ENCOUNTER — Ambulatory Visit: Payer: 59 | Admitting: Cardiology

## 2023-07-27 ENCOUNTER — Telehealth: Payer: Self-pay | Admitting: Student

## 2023-07-27 DIAGNOSIS — I4892 Unspecified atrial flutter: Secondary | ICD-10-CM | POA: Diagnosis not present

## 2023-07-27 NOTE — Telephone Encounter (Signed)
Irving Burton from [facility] calling with Abnormal zio results. Call transferred

## 2023-07-27 NOTE — Telephone Encounter (Addendum)
Irving Burton from [facility] called to give end of monitor report for patient . He wore monitor from June 5-12   He had 1 episode of complete heart block for 17 seconds 5.6 second pause due to AV block 11% burden of atrial flutter 38 runs of SVT/  Report is scanned in. I did speak with DOD Dr. Flora Lipps he stated we did not have to change anything currently. Informed him I will send to [facility].

## 2023-07-28 NOTE — Telephone Encounter (Signed)
Ryan [name], please see the result note for my full review of monitor. He needs a sooner appointment with EP.   Thank you!

## 2023-08-01 ENCOUNTER — Ambulatory Visit: Payer: 59 | Admitting: Student

## 2023-08-03 NOTE — Telephone Encounter (Signed)
Thank you for trying to reach her. Ryan [name], can we send him a letter?   Thank you!

## 2023-08-04 NOTE — Telephone Encounter (Signed)
LETTER MAILED

## 2023-08-05 NOTE — Progress Notes (Signed)
Sent message to scheduling to call pt and make sooner appt.

## 2023-08-12 ENCOUNTER — Encounter: Payer: Self-pay | Admitting: *Deleted

## 2023-08-12 ENCOUNTER — Ambulatory Visit: Payer: 59 | Attending: Cardiology | Admitting: Cardiology

## 2023-08-12 ENCOUNTER — Encounter: Payer: Self-pay | Admitting: Cardiology

## 2023-08-12 VITALS — BP 166/98 | HR 90 | Ht 69.0 in | Wt 232.0 lb

## 2023-08-12 DIAGNOSIS — I4892 Unspecified atrial flutter: Secondary | ICD-10-CM

## 2023-08-12 DIAGNOSIS — I10 Essential (primary) hypertension: Secondary | ICD-10-CM

## 2023-08-12 DIAGNOSIS — I480 Paroxysmal atrial fibrillation: Secondary | ICD-10-CM

## 2023-08-12 DIAGNOSIS — D6869 Other thrombophilia: Secondary | ICD-10-CM

## 2023-08-12 NOTE — Progress Notes (Signed)
Electrophysiology Office Note:   Date:  08/12/2023  ID:  Deacan, Scharff 05/21/1961, MRN [id number]  Primary Cardiologist: Chrystie Nose, MD Electrophysiologist: Lanier Prude, MD      History of Present Illness:   Ryan [name] is a 62 y.o. male with h/o atrial fibrillation/flutter, hypertension, diabetes seen today for  for Electrophysiology evaluation of atrial fibrillation at the request of Marjie Skiff.    He has a past history as above.  He presents today for follow-up for his atrial flutter.  He was admitted to the hospital 04/25/2023 with foot pain and was incidentally found to be in atrial flutter.  He continues to have palpitations.  He wore a cardiac monitor that showed a normal ejection fraction and wore a cardiac monitor that showed a 11% atrial fibrillation/flutter burden.  When he is in atrial fibrillation/flutter, he feels palpitations, fatigue, shortness of breath.  Had a few episodes of near syncope.  Review of systems complete and found to be negative unless listed in HPI.   EP Information / Studies Reviewed:    EKG is ordered today. Personal review as below.  EKG Interpretation Date/Time:  Friday August 12 2023 09:58:12 EDT Ventricular Rate:  90 PR Interval:  172 QRS Duration:  82 QT Interval:  354 QTC Calculation: 433 R Axis:   34  Text Interpretation: Sinus rhythm with Premature atrial complexes Possible Left atrial enlargement Nonspecific T wave abnormality When compared with ECG of 13-Jul-2023 18:48, No significant change since last tracing Confirmed by Shalom Ware ([id number]) on 08/12/2023 10:02:59 AM     Risk Assessment/Calculations:    CHA2DS2-VASc Score = 2   This indicates a 2.2% annual risk of stroke. The patient's score is based upon: CHF History: 0 HTN History: 1 Diabetes History: 1 Stroke History: 0 Vascular Disease History: 0 Age Score: 0 Gender Score: 0            Physical Exam:   VS:  BP (!) 166/98    Pulse 90   Ht 5\' 9"  (1.753 m)   Wt 232 lb (105.2 kg)   SpO2 96%   BMI 34.26 kg/m    Wt Readings from Last 3 Encounters:  08/12/23 232 lb (105.2 kg)  07/14/23 222 lb 10.6 oz (101 kg)  05/24/23 240 lb 4.8 oz (109 kg)     GEN: Well nourished, well developed in no acute distress NECK: No JVD; No carotid bruits CARDIAC: Regular rate and rhythm, no murmurs, rubs, gallops RESPIRATORY:  Clear to auscultation without rales, wheezing or rhonchi  ABDOMEN: Soft, non-tender, non-distended EXTREMITIES:  No edema; No deformity   ASSESSMENT AND PLAN:    1.  Paroxysmal atrial fibrillation/flutter: Currently on carvedilol and diltiazem.  He has had postconversion pauses and felt near syncopal.  Annlouise Gerety stop carvedilol today.  He would benefit from rhythm control.  We discussed ablation versus medication options.  He Bunnie Lederman likely agree to ablation but wants to discuss it further with his family.  He Benjamin Casanas call us back next week.  If he does agree to ablation, we Carra Brindley start amiodarone as a bridge to ablation.  He has had near syncope which appear to be postconversion pauses.  Rhythm control should improve his symptoms.  Risk, benefits, and alternatives to EP study and radiofrequency/pulse field ablation for afib were also discussed in detail today. These risks include but are not limited to stroke, bleeding, vascular damage, tamponade, perforation, damage to the esophagus, lungs, and other structures, pulmonary vein stenosis, worsening  renal function, and death. The patient understands these risk and wishes to proceed.  We Teofil Maniaci therefore proceed with catheter ablation at the next available time.  Carto, ICE, anesthesia are requested for the procedure.  Blanche Scovell also obtain CT PV protocol prior to the procedure to exclude LAA thrombus and further evaluate atrial anatomy.  2.  Chronic diastolic heart failure: No obvious volume overload.  Plan per primary cardiology.  3.  Hypertension: Elevated today.  Has been  well-controlled in the past.  Plan per primary cardiology  4.  Likely obstructive sleep apnea: Has plans for sleep study  5.  Secondary hypercoagulable state: Currently on Eliquis for atrial fibrillation  Follow up with Dr. Elberta Fortis as usual post procedure  Signed, Phyllistine Domingos Jorja Loa, MD

## 2023-08-12 NOTE — Patient Instructions (Signed)
Medication Instructions:  Your physician has recommended you make the following change in your medication:  STOP Carvedilol  *If you need a refill on your cardiac medications before your next appointment, please call your pharmacy*   Lab Work: None ordered If you have labs (blood work) drawn today and your tests are completely normal, you will receive your results only by: MyChart Message (if you have MyChart) OR A paper copy in the mail If you have any lab test that is abnormal or we need to change your treatment, we will call you to review the results.   Testing/Procedures: None ordered   Follow-Up: At [facility], you and your health needs are our priority.  As part of our continuing mission to provide you with exceptional heart care, we have created designated Provider Care Teams.  These Care Teams include your primary Cardiologist (physician) and Advanced Practice Providers (APPs -  Physician Assistants and Nurse Practitioners) who all work together to provide you with the care you need, when you need it.  We recommend signing up for the patient portal called "MyChart".  Sign up information is provided on this After Visit Summary.  MyChart is used to connect with patients for Virtual Visits (Telemedicine).  Patients are able to view lab/test results, encounter notes, upcoming appointments, etc.  Non-urgent messages can be sent to your provider as well.   To learn more about what you can do with MyChart, go to [email or web address].    Your next appointment:   To be  determined  The format for your next appointment:   In Person  Provider:   Loman Brooklyn, MD    Thank you for choosing [facility]!!   Dory Horn, RN [phone]  Other Instructions  Cardiac Ablation Cardiac ablation is a procedure to destroy (ablate) heart tissue that is sending bad signals. These bad signals cause the heart to beat very fast or in a way that is not normal. Destroying some  tissues can help make the heart rhythm normal. Tell your doctor about: Any allergies you have. All medicines you are taking. These include vitamins, herbs, eye drops, creams, and over-the-counter medicines. Any problems you or family members have had with anesthesia. Any bleeding problems you have. Any surgeries you have had. Any medical conditions you have. Whether you are pregnant or may be pregnant. What are the risks? Your doctor will talk with you about risks. These may include: Infection. Bruising and bleeding. Stroke or blood clots. Damage to nearby areas of your body. Allergies to medicines or dyes. Needing a pacemaker if the heart gets damaged. A pacemaker helps the heart beat normally. The procedure not working. What happens before the procedure? Medicines Ask your doctor about changing or stopping: Your normal medicines. Vitamins, herbs, and supplements. Over-the-counter medicines. Do not take aspirin or ibuprofen unless you are told to. General instructions Follow instructions from your doctor about what you may eat and drink. If you will be going home right after the procedure, plan to have a responsible adult: Take you home from the hospital or clinic. You will not be allowed to drive. Care for you for the time you are told. Ask your doctor what steps will be taken to prevent the spread of germs. What happens during the procedure?  An IV tube will be put into one of your veins. You may be given: A sedative. This helps you relax. Anesthesia. This will: Numb certain areas of your body. The skin on your neck or groin  will be numbed. A cut (incision) will be made in your neck or groin. A needle will be put through the cut and into a large vein. The small, thin tube (catheter) will be put into the needle. The tube will be moved to your heart. A type of X-ray (fluoroscopy) will be used to help guide the tube. It will also show constant images of the heart on a  screen. Dye may be put through the tube. This helps your doctor see your heart. An electric current will be sent from the tube to destroy heart tissue in certain areas. The tube will be taken out. Pressure will be held on your cut. This helps stop bleeding. A bandage (dressing) will be put over your cut. The procedure may vary among doctors and hospitals. What happens after the procedure? You will be monitored until you leave the hospital or clinic. This includes checking your blood pressure, heart rate and rhythm, breathing rate, and blood oxygen level. Your cut will be checked for bleeding. You will need to lie still for a few hours. If your groin was used, you will need to keep your leg straight for a few hours after the small, thin tube is removed. This information is not intended to replace advice given to you by your health care provider. Make sure you discuss any questions you have with your health care provider. Document Revised: 05/18/2022 Document Reviewed: 05/18/2022 Elsevier Patient Education  2024 [employer].

## 2023-08-20 ENCOUNTER — Encounter (HOSPITAL_COMMUNITY): Payer: Self-pay

## 2023-08-20 ENCOUNTER — Other Ambulatory Visit: Payer: Self-pay

## 2023-08-20 ENCOUNTER — Emergency Department (HOSPITAL_COMMUNITY)
Admission: EM | Admit: 2023-08-20 | Discharge: 2023-08-20 | Disposition: A | Discharge status: Home / Self Care | Payer: 59 | Attending: Emergency Medicine | Admitting: Emergency Medicine

## 2023-08-20 ENCOUNTER — Emergency Department (HOSPITAL_COMMUNITY): Payer: 59

## 2023-08-20 DIAGNOSIS — Y906 Blood alcohol level of 120-199 mg/100 ml: Secondary | ICD-10-CM | POA: Insufficient documentation

## 2023-08-20 DIAGNOSIS — Z743 Need for continuous supervision: Secondary | ICD-10-CM | POA: Diagnosis not present

## 2023-08-20 DIAGNOSIS — M25552 Pain in left hip: Secondary | ICD-10-CM | POA: Diagnosis not present

## 2023-08-20 DIAGNOSIS — Z794 Long term (current) use of insulin: Secondary | ICD-10-CM | POA: Insufficient documentation

## 2023-08-20 DIAGNOSIS — M4316 Spondylolisthesis, lumbar region: Secondary | ICD-10-CM | POA: Diagnosis not present

## 2023-08-20 DIAGNOSIS — Z7901 Long term (current) use of anticoagulants: Secondary | ICD-10-CM | POA: Diagnosis not present

## 2023-08-20 DIAGNOSIS — F10920 Alcohol use, unspecified with intoxication, uncomplicated: Secondary | ICD-10-CM | POA: Insufficient documentation

## 2023-08-20 DIAGNOSIS — I4891 Unspecified atrial fibrillation: Secondary | ICD-10-CM | POA: Diagnosis not present

## 2023-08-20 DIAGNOSIS — I480 Paroxysmal atrial fibrillation: Secondary | ICD-10-CM

## 2023-08-20 DIAGNOSIS — M25551 Pain in right hip: Secondary | ICD-10-CM | POA: Diagnosis not present

## 2023-08-20 DIAGNOSIS — R55 Syncope and collapse: Secondary | ICD-10-CM | POA: Diagnosis not present

## 2023-08-20 DIAGNOSIS — M4856XA Collapsed vertebra, not elsewhere classified, lumbar region, initial encounter for fracture: Secondary | ICD-10-CM | POA: Diagnosis not present

## 2023-08-20 DIAGNOSIS — M545 Low back pain, unspecified: Secondary | ICD-10-CM | POA: Diagnosis not present

## 2023-08-20 DIAGNOSIS — E162 Hypoglycemia, unspecified: Secondary | ICD-10-CM | POA: Diagnosis not present

## 2023-08-20 DIAGNOSIS — F10129 Alcohol abuse with intoxication, unspecified: Secondary | ICD-10-CM | POA: Diagnosis not present

## 2023-08-20 DIAGNOSIS — R531 Weakness: Secondary | ICD-10-CM | POA: Diagnosis not present

## 2023-08-20 DIAGNOSIS — F10931 Alcohol use, unspecified with withdrawal delirium: Secondary | ICD-10-CM | POA: Diagnosis not present

## 2023-08-20 DIAGNOSIS — M47816 Spondylosis without myelopathy or radiculopathy, lumbar region: Secondary | ICD-10-CM | POA: Diagnosis not present

## 2023-08-20 DIAGNOSIS — E161 Other hypoglycemia: Secondary | ICD-10-CM | POA: Diagnosis not present

## 2023-08-20 LAB — BASIC METABOLIC PANEL
Anion gap: 16 — ABNORMAL HIGH (ref 5–15)
BUN: 14 mg/dL (ref 6–20)
CO2: 17 mmol/L — ABNORMAL LOW (ref 22–32)
Calcium: 8.3 mg/dL — ABNORMAL LOW (ref 8.9–10.3)
Chloride: 102 mmol/L (ref 98–111)
Creatinine, Ser: 1.87 mg/dL — ABNORMAL HIGH (ref 0.61–1.24)
GFR, Estimated: 41 mL/min — ABNORMAL LOW (ref >60)
Glucose, Bld: 66 mg/dL — ABNORMAL LOW (ref 70–99)
Potassium: 3 mmol/L — ABNORMAL LOW (ref 3.5–5.1)
Sodium: 135 mmol/L (ref 135–145)

## 2023-08-20 LAB — CBC
HCT: 38.9 % — ABNORMAL LOW (ref 39.0–52.0)
Hemoglobin: 12.3 g/dL — ABNORMAL LOW (ref 13.0–17.0)
MCH: 26.1 pg (ref 26.0–34.0)
MCHC: 31.6 g/dL (ref 30.0–36.0)
MCV: 82.6 fL (ref 80.0–100.0)
Platelets: 284 10*3/uL (ref 150–400)
RBC: 4.71 MIL/uL (ref 4.22–5.81)
RDW: 16.5 % — ABNORMAL HIGH (ref 11.5–15.5)
WBC: 7 10*3/uL (ref 4.0–10.5)
nRBC: 0 % (ref 0.0–0.2)

## 2023-08-20 LAB — HEPATIC FUNCTION PANEL
ALT: 15 U/L (ref 0–44)
AST: 23 U/L (ref 15–41)
Albumin: 3.4 g/dL — ABNORMAL LOW (ref 3.5–5.0)
Alkaline Phosphatase: 74 U/L (ref 38–126)
Bilirubin, Direct: 0.2 mg/dL (ref 0.0–0.2)
Indirect Bilirubin: 0.4 mg/dL (ref 0.3–0.9)
Total Bilirubin: 0.6 mg/dL (ref 0.3–1.2)
Total Protein: 7 g/dL (ref 6.5–8.1)

## 2023-08-20 LAB — CBG MONITORING, ED
Glucose-Capillary: 87 mg/dL (ref 70–99)
Glucose-Capillary: 55 mg/dL — ABNORMAL LOW (ref 70–99)
Glucose-Capillary: 102 mg/dL — ABNORMAL HIGH (ref 70–99)
Glucose-Capillary: 172 mg/dL — ABNORMAL HIGH (ref 70–99)

## 2023-08-20 LAB — ETHANOL: Alcohol, Ethyl (B): 182 mg/dL — ABNORMAL HIGH (ref <10)

## 2023-08-20 LAB — TROPONIN I (HIGH SENSITIVITY)
Troponin I (High Sensitivity): 13 ng/L (ref <18)
Troponin I (High Sensitivity): 13 ng/L (ref <18)

## 2023-08-20 LAB — LIPASE, BLOOD: Lipase: 25 U/L (ref 11–51)

## 2023-08-20 LAB — MAGNESIUM: Magnesium: 1.3 mg/dL — ABNORMAL LOW (ref 1.7–2.4)

## 2023-08-20 MED ORDER — POTASSIUM CHLORIDE CRYS ER 20 MEQ PO TBCR
40.0000 meq | EXTENDED_RELEASE_TABLET | Freq: Once | ORAL | Status: AC
  Administered 2023-08-20: 40 meq via ORAL
  Filled 2023-08-20: qty 2

## 2023-08-20 MED ORDER — MAGNESIUM OXIDE -MG SUPPLEMENT 400 (240 MG) MG PO TABS
400.0000 mg | ORAL_TABLET | Freq: Once | ORAL | Status: AC
  Administered 2023-08-20: 400 mg via ORAL
  Filled 2023-08-20: qty 1

## 2023-08-20 NOTE — ED Notes (Signed)
CBG 55, pt provided juice. MD Messick notified

## 2023-08-20 NOTE — ED Triage Notes (Addendum)
[place]IB [facility] [place]. Pt c/o L hip pain and generalized weakness. ETOH on board.   EMS Vitals   112/68 SpO2 98% on R/A HR 82 CBG 70

## 2023-08-20 NOTE — Discharge Instructions (Signed)
Thank you for coming to [facility] Emergency Department. You were seen for hip/back pain, alcohol intoxication. While you were in the ED you had a syncopal episode (passed out) and were in atrial fibrillation intermittently. You were observed for several more hours in the ED with no recurrence of symptoms. Please call your cardiologist Dr. Elberta Fortis on Monday to further discuss the ablation for atrial fibrillation.   Please follow up with your primary care provider within 1 week.   Do not hesitate to return to the ED or call 911 if you experience: -Worsening symptoms -Chest pain, shortness of breath -Lightheadedness, passing out -Fevers/chills -Anything else that concerns you

## 2023-08-20 NOTE — ED Provider Notes (Signed)
3:24 PM Assumed care of patient from off-going team. For more details, please see note from same day.  In brief, this is a 62 y.o. male with EtOH on board and LBP radiating to bilateral thighs. He is ambulatory with full strength in BL LEs.   Plan/Dispo at time of sign-out & ED Course since sign-out: [ ]  plain films of L spine and hips [ ]  MTF, reevaluate  BP 104/62 (BP Location: Left Arm)   Pulse 70   Temp 98.7 F (37.1 C) (Oral)   Resp 18   Ht 5\' 9"  (1.753 m)   Wt 104.3 kg   SpO2 98%   BMI 33.97 kg/m    Per chart review most recent cardiology visit on 08/12/23: Paroxysmal atrial fibrillation/flutter: Currently on carvedilol and diltiazem.  He has had postconversion pauses and felt near syncopal.  Will stop carvedilol today.  He would benefit from rhythm control.  We discussed ablation versus medication options.  He will likely agree to ablation but wants to discuss it further with his family.  He will call us back next week.  If he does agree to ablation, we will start amiodarone as a bridge to ablation.  He has had near syncope which appear to be postconversion pauses.  Rhythm control should improve his symptoms.     ED Course:   Clinical Course as of 08/20/23 2236  Sat Aug 20, 2023  1532 While on the monitor, patient appeared to have a syncopal episode. His HR went down to 30s-40s bpm, patient lost consciousness. He had a pulse by nursing. After a few seconds, patient woke up and did not know what happened. He feels the same. Denies CP, SOB, palpitations. He did not have any seizure-like activity. Glucose 108 mg/dL. Repeat EKG similar to priors. [HN]  1534 No acute findings on BL hip XRs [HN]  1607 Discussed with the patient.   He states that he feels he states that he feels poorly right now and still has lower back and bilateral hip pain.  When I asked him about the syncopal episode he states that that is never happened to him before however he has been worked up for palpitations  in the past.  He has had a Holter monitor and states that he was told that a cardiologist told him he needed to have some sort of procedure done but it has not happened yet.  Per chart review patient has been admitted in the past for A-fib with RVR or a flutter with RVR.  On the monitor here at bedside it is noted that he has an irregular heart rate and repeat EKG demonstrates either intermittent A-fib or high PAC burden however I have higher suspicion for atrial fibrillation intermittently.  Patient does take Eliquis and Cardizem at home.  He reports compliance with these medications. [HN]  1647 DG Lumbar Spine Complete Predominately facet arthropathy of the lumbar spine. [HN]  1719 Magnesium(!): 1.3 Mild hypomagnesemia, will replete  [HN]  1719 Troponin I (High Sensitivity): 13 Neg [HN]    Clinical Course User Index [HN] Loetta Rough, MD    Patient has no further episodes while in the ED. Per chart review he is currently undergoing w/u with cardiology for management of symptomatic afib (has had presyncope in the past w/ atrial fibrillation episodes) and has plans for ablation. Encouraged patient to stay compliant with his eliquis and cardizem. His hypokalemia/hypomagnesemia are repleted. Instructed patient to call his cardiologist in the AM to make f/u appointment  and further discuss ablation. Also likely that EtOH contributed to patient's symptoms today and encouraged him to abstain from alcohol and stay well hydrated. Patient is HDS and feels improved upon discharge. Given DC instructions/return precautions.  ------------------------------- Vivi Barrack, MD Emergency Medicine  This note was created using dictation software, which may contain spelling or grammatical errors.   Loetta Rough, MD 08/23/23 2116

## 2023-08-20 NOTE — ED Notes (Signed)
Pt was eating a sandwich when the Dinam[employer] reports Pt HR to decline into the 30-40's. Pt was initially confused or disorientated to verbal stimuli and would knod yes/no. Pt began to tear up and head slumped back to bed. 12 lead/CBG taken and reported to MD. Event lasted approx. 2 mins and Pts mental status slowly improved. Pt reports he felt very dizzy and does not remember what happened.

## 2023-08-20 NOTE — ED Provider Notes (Signed)
[place] EMERGENCY DEPARTMENT AT [facility] Provider Note   CSN: [id number] Arrival date & time: 08/20/23  1259     History  No chief complaint on file.   Ryan [name] is a 62 y.o. male.  62 year old male with prior medical history as detailed below presents for evaluation.  Patient complains of low back  pain.  This radiates to bilateral hips.  EMS reports the patient was [place] the [place].  Patient was self-reported recent alcohol intake.  He smells of alcohol during evaluation.  He reports that he has been having low back pain for several days.  He denies any specific inciting event.  He denies any fall or heavy lifting that may have caused or provoked his pain.  Per EMS he was ambulatory prior to their arrival.  Patient denies weakness to his lower extremities, change in urination, change in bowel movements, etc.  The history is provided by the patient and medical records.       Home Medications Prior to Admission medications   Medication Sig Start Date End Date Taking? Authorizing Provider  apixaban (ELIQUIS) 5 MG TABS tablet Take 1 tablet (5 mg total) by mouth 2 (two) times daily. 04/26/23 07/25/23  Darlin Drop, DO  diltiazem (CARDIZEM CD) 240 MG 24 hr capsule Take 1 capsule (240 mg total) by mouth daily. 04/26/23   Darlin Drop, DO  glipiZIDE (GLUCOTROL) 10 MG tablet Take 10 mg by mouth daily. 03/04/21   [provider]  insulin NPH-regular Human (NOVOLIN 70/30) (70-30) 100 UNIT/ML injection Inject 35 units into the skin 2 times daily with a meal. Patient taking differently: Inject 45 Units into the skin 2 (two) times daily with a meal. 07/25/20   Marjie Skiff E, PA-C  losartan (COZAAR) 50 MG tablet Take 1 tablet (50 mg total) by mouth daily. 04/26/23 05/26/23  Darlin Drop, DO  omeprazole (PRILOSEC) 40 MG capsule Take 40 mg by mouth daily. 01/07/21   [provider]  promethazine-dextromethorphan  (PROMETHAZINE-DM) 6.25-15 MG/5ML syrup Take 5 mLs by mouth 4 (four) times daily as needed for cough. 04/11/23   Rising, Lurena Joiner, PA-C  ZYRTEC ALLERGY 10 MG tablet Take 10 mg by mouth in the morning. 03/03/20   [provider]      Allergies    Patient has no known allergies.    Review of Systems   Review of Systems  All other systems reviewed and are negative.   Physical Exam Updated Vital Signs Ht 5\' 9"  (1.753 m)   Wt 101.2 kg   BMI 32.93 kg/m  Physical Exam Vitals and nursing note reviewed.  Constitutional:      General: He is not in acute distress.    Appearance: Normal appearance. He is well-developed.     Comments: Smells of EtOH  HENT:     Head: Normocephalic and atraumatic.  Eyes:     Conjunctiva/sclera: Conjunctivae normal.     Pupils: Pupils are equal, round, and reactive to light.  Cardiovascular:     Rate and Rhythm: Normal rate and regular rhythm.     Heart sounds: Normal heart sounds.  Pulmonary:     Effort: Pulmonary effort is normal. No respiratory distress.     Breath sounds: Normal breath sounds.  Abdominal:     General: There is no distension.     Palpations: Abdomen is soft.     Tenderness: There is no abdominal tenderness.  Musculoskeletal:  General: No deformity. Normal range of motion.     Cervical back: Normal range of motion and neck supple.  Skin:    General: Skin is warm and dry.  Neurological:     General: No focal deficit present.     Mental Status: He is alert and oriented to person, place, and time.     ED Results / Procedures / Treatments   Labs (all labs ordered are listed, but only abnormal results are displayed) Labs Reviewed  BASIC METABOLIC PANEL  CBC  URINALYSIS, ROUTINE W REFLEX MICROSCOPIC  LIPASE, BLOOD  HEPATIC FUNCTION PANEL  ETHANOL  CBG MONITORING, ED    EKG None  Radiology No results found.  Procedures Procedures    Medications Ordered in ED Medications - No data to display  ED  Course/ Medical Decision Making/ A&P Clinical Course as of 08/20/23 1541  Sat Aug 20, 2023  1532 While on the monitor, patient appeared to have a syncopal episode. His HR went down to 30s-40s bpm, patient lost consciousness. He had a pulse by nursing. After a few seconds, patient woke up and did not know what happened. He feels the same. Denies CP, SOB, palpitations. He did not have any seizure-like activity. Glucose 108 mg/dL. Repeat EKG similar to priors. [HN]  1534 No acute findings on BL hip XRs [HN]    Clinical Course User Index [HN] Loetta Rough, MD                                 Medical Decision Making Amount and/or Complexity of Data Reviewed Labs: ordered. Radiology: ordered.  Risk Prescription drug management.    Medical Screen Complete  This patient presented to the ED with complaint of back pain, alcohol intoxication.  This complaint involves an extensive number of treatment options. The initial differential diagnosis includes, but is not limited to, metabolic abnormality, alcohol intoxication, trauma to spine, pelvis  This presentation is: Acute, Chronic, Self-Limited, Previously Undiagnosed, Uncertain Prognosis, Complicated, Systemic Symptoms, and Threat to Life/Bodily Function  Patient is presenting with complaint of low back pain.  Additionally, patient appears to be clinically intoxicated.  Workup initiated including EtOH level evaluation.  Patient's alcohol is elevated at 182.  Potassium is 3.0.  Additional workup pending at time of shift change.  Oncoming ED provider made aware of case and will evaluate.   Additional history obtained:  External records from outside sources obtained and reviewed including prior ED visits and prior Inpatient records.    Lab Tests:  I ordered and personally interpreted labs.  The pertinent results include: CBC, BMP, LFTs, EtOH, lipase   Imaging Studies ordered:  I ordered imaging studies including plain films of  lumbar spine, pelvis and bilateral hips I independently visualized and interpreted obtained imaging which showed NAD I agree with the radiologist interpretation.   Cardiac Monitoring:  The patient was maintained on a cardiac monitor.  I personally viewed and interpreted the cardiac monitor which showed an underlying rhythm of: NSR   Medicines ordered:  I ordered medication including potassium for hypokalemia Reevaluation of the patient after these medicines showed that the patient: improved   Problem List / ED Course:  Alcohol intoxication, back pain   Reevaluation:  After the interventions noted above, I reevaluated the patient and found that they have: improved   Disposition:  After consideration of the diagnostic results and the patients response to treatment, I feel that the  patent would benefit from completion of ED evaluation.          Final Clinical Impression(s) / ED Diagnoses Final diagnoses:  Alcoholic intoxication without complication [facility])    Rx / DC Orders ED Discharge Orders     None         Wynetta Fines, MD 08/20/23 [phone]

## 2023-08-20 NOTE — ED Notes (Signed)
Sister Meriam Sprague [phone] would like an update and talk to her brother asap

## 2023-09-15 ENCOUNTER — Other Ambulatory Visit: Payer: Self-pay

## 2023-09-15 ENCOUNTER — Emergency Department (HOSPITAL_COMMUNITY)
Admission: EM | Admit: 2023-09-15 | Discharge: 2023-09-15 | Disposition: A | Discharge status: Home / Self Care | Payer: 59 | Attending: Emergency Medicine | Admitting: Emergency Medicine

## 2023-09-15 DIAGNOSIS — W44F4XA Insect entering into or through a natural orifice, initial encounter: Secondary | ICD-10-CM | POA: Diagnosis not present

## 2023-09-15 DIAGNOSIS — Z79899 Other long term (current) drug therapy: Secondary | ICD-10-CM | POA: Insufficient documentation

## 2023-09-15 DIAGNOSIS — Z794 Long term (current) use of insulin: Secondary | ICD-10-CM | POA: Insufficient documentation

## 2023-09-15 DIAGNOSIS — H6122 Impacted cerumen, left ear: Secondary | ICD-10-CM | POA: Insufficient documentation

## 2023-09-15 DIAGNOSIS — T162XXA Foreign body in left ear, initial encounter: Secondary | ICD-10-CM | POA: Diagnosis not present

## 2023-09-15 NOTE — ED Triage Notes (Signed)
Pt arrives to ED c/o possible insect in left ear x 2 hours. Pt states that he can feel something crawling around, no pain but endorses trouble hearing

## 2023-09-15 NOTE — ED Provider Notes (Signed)
[place] EMERGENCY DEPARTMENT AT [facility] Provider Note   CSN: [id number] Arrival date & time: 09/15/23  4098     History  Chief Complaint  Patient presents with   Foreign Body in Ear    Ryan [name] is a 62 y.o. male.  Presents to the emergency permit for evaluation of bug in left ear.  Reports that he can feel it moving in his ear.  He has noticed decreased hearing as well.       Home Medications Prior to Admission medications   Medication Sig Start Date End Date Taking? Authorizing Provider  apixaban (ELIQUIS) 5 MG TABS tablet Take 1 tablet (5 mg total) by mouth 2 (two) times daily. 04/26/23 07/25/23  Darlin Drop, DO  diltiazem (CARDIZEM CD) 240 MG 24 hr capsule Take 1 capsule (240 mg total) by mouth daily. 04/26/23   Darlin Drop, DO  glipiZIDE (GLUCOTROL) 10 MG tablet Take 10 mg by mouth daily. 03/04/21   [provider]  insulin NPH-regular Human (NOVOLIN 70/30) (70-30) 100 UNIT/ML injection Inject 35 units into the skin 2 times daily with a meal. Patient taking differently: Inject 45 Units into the skin 2 (two) times daily with a meal. 07/25/20   Marjie Skiff E, PA-C  losartan (COZAAR) 50 MG tablet Take 1 tablet (50 mg total) by mouth daily. 04/26/23 05/26/23  Darlin Drop, DO  omeprazole (PRILOSEC) 40 MG capsule Take 40 mg by mouth daily. 01/07/21   [provider]  promethazine-dextromethorphan (PROMETHAZINE-DM) 6.25-15 MG/5ML syrup Take 5 mLs by mouth 4 (four) times daily as needed for cough. 04/11/23   Rising, Lurena Joiner, PA-C  ZYRTEC ALLERGY 10 MG tablet Take 10 mg by mouth in the morning. 03/03/20   [provider]      Allergies    Patient has no known allergies.    Review of Systems   Review of Systems  Physical Exam Updated Vital Signs BP (!) 170/94 (BP Location: Right Arm)   Pulse 73   Temp 97.7 F (36.5 C)   Resp 19   Ht 5\' 9"  (1.753 m)   Wt 115.2 kg   SpO2 96%   BMI 37.51 kg/m  Physical  Exam Vitals and nursing note reviewed.  Constitutional:      Appearance: Normal appearance.  HENT:     Head: Normocephalic.     Right Ear: There is impacted cerumen.     Left Ear: There is impacted cerumen. A foreign body is present.     Ears:     Comments: Small beetle insect left canal Neurological:     Mental Status: He is alert.     ED Results / Procedures / Treatments   Labs (all labs ordered are listed, but only abnormal results are displayed) Labs Reviewed - No data to display  EKG None  Radiology No results found.  Procedures .Foreign Body Removal  Date/Time: 09/15/2023 6:51 AM  Performed by: Gilda Crease, MD Authorized by: Gilda Crease, MD  Consent: Verbal consent obtained. Risks and benefits: risks, benefits and alternatives were discussed Patient understanding: patient states understanding of the procedure being performed Patient consent: the patient's understanding of the procedure matches consent given Procedure consent: procedure consent matches procedure scheduled Relevant documents: relevant documents present and verified Test results: test results available and properly labeled Site marked: the operative site was marked Imaging studies: imaging studies available Patient identity confirmed: verbally with patient Time out: Immediately prior to procedure a "time  out" was called to verify the correct patient, procedure, equipment, support staff and site/side marked as required. Body area: ear Location details: left ear  Sedation: Patient sedated: no  Patient restrained: no Patient cooperative: yes Removal mechanism: forceps Complexity: simple 1 objects recovered. Objects recovered: bug Post-procedure assessment: foreign body removed Patient tolerance: patient tolerated the procedure well with no immediate complications  .Ear Cerumen Removal  Date/Time: 09/15/2023 6:55 AM  Performed by: Gilda Crease, MD Authorized  by: Gilda Crease, MD   Consent:    Consent obtained:  Verbal   Consent given by:  Patient   Risks, benefits, and alternatives were discussed: yes     Risks discussed:  Bleeding, infection, TM perforation and pain Universal protocol:    Procedure explained and questions answered to patient or proxy's satisfaction: yes     Relevant documents present and verified: yes     Test results available: yes     Imaging studies available: yes     Required blood products, implants, devices, and special equipment available: yes     Site/side marked: yes     Immediately prior to procedure, a time out was called: yes     Patient identity confirmed:  Verbally with patient Procedure details:    Location:  L ear   Procedure type: irrigation     Procedure outcomes: cerumen removed   Post-procedure details:    Inspection:  TM intact   Hearing quality:  Improved   Procedure completion:  Tolerated well, no immediate complications     Medications Ordered in ED Medications - No data to display  ED Course/ Medical Decision Making/ A&P                                 Medical Decision Making  Presents with bug in his left ear.  This was removed with forceps.  Patient had a remaining cerumen impaction which was also irrigated with saline and peroxide for removal.        Final Clinical Impression(s) / ED Diagnoses Final diagnoses:  Foreign body of left ear, initial encounter  Impacted cerumen of left ear    Rx / DC Orders ED Discharge Orders     None         Gilda Crease, MD 09/15/23 [phone]

## 2023-09-23 ENCOUNTER — Telehealth: Payer: Self-pay | Admitting: *Deleted

## 2023-09-23 NOTE — Telephone Encounter (Signed)
Pt says he thinks he wants to proceed w/ ablation as he has had further episodes. He will call office back to schedule ablation.

## 2023-09-23 NOTE — Telephone Encounter (Signed)
-----   Message from Nurse Lakasha Mcfall P sent at 08/12/2023 10:16 AM EDT ----- Regarding: ?? ablation Ablation info given to pt -- does he want to proceed?

## 2023-10-11 ENCOUNTER — Other Ambulatory Visit: Payer: Self-pay

## 2023-10-11 ENCOUNTER — Emergency Department (HOSPITAL_COMMUNITY)
Admission: EM | Admit: 2023-10-11 | Discharge: 2023-10-12 | Disposition: A | Discharge status: Home / Self Care | Payer: 59 | Attending: Emergency Medicine | Admitting: Emergency Medicine

## 2023-10-11 DIAGNOSIS — R0981 Nasal congestion: Secondary | ICD-10-CM | POA: Diagnosis not present

## 2023-10-11 DIAGNOSIS — M79671 Pain in right foot: Secondary | ICD-10-CM | POA: Insufficient documentation

## 2023-10-11 DIAGNOSIS — I4891 Unspecified atrial fibrillation: Secondary | ICD-10-CM | POA: Diagnosis not present

## 2023-10-11 DIAGNOSIS — Z79899 Other long term (current) drug therapy: Secondary | ICD-10-CM | POA: Diagnosis not present

## 2023-10-11 DIAGNOSIS — Z7984 Long term (current) use of oral hypoglycemic drugs: Secondary | ICD-10-CM | POA: Insufficient documentation

## 2023-10-11 DIAGNOSIS — M25512 Pain in left shoulder: Secondary | ICD-10-CM | POA: Insufficient documentation

## 2023-10-11 DIAGNOSIS — E119 Type 2 diabetes mellitus without complications: Secondary | ICD-10-CM | POA: Insufficient documentation

## 2023-10-11 DIAGNOSIS — Z794 Long term (current) use of insulin: Secondary | ICD-10-CM | POA: Insufficient documentation

## 2023-10-11 DIAGNOSIS — I10 Essential (primary) hypertension: Secondary | ICD-10-CM | POA: Diagnosis not present

## 2023-10-11 DIAGNOSIS — Z7901 Long term (current) use of anticoagulants: Secondary | ICD-10-CM | POA: Diagnosis not present

## 2023-10-11 DIAGNOSIS — M25522 Pain in left elbow: Secondary | ICD-10-CM | POA: Insufficient documentation

## 2023-10-11 DIAGNOSIS — M2550 Pain in unspecified joint: Secondary | ICD-10-CM

## 2023-10-11 NOTE — ED Triage Notes (Addendum)
Pt to ED POV from home. Pt c/o left arm / shoulder pain (tender) and right foot pain (tender) x1 week. Pt c/o mild swelling, but no redness. Pt denies any injuries. Pt also c/o nasal drainage x1 week. Pt denies being around anyone who has been sick. Pt denies any fevers at home. Pt denies any cough. Pt states he was seen today at PCP for same and was sent home with pain medication.

## 2023-10-12 MED ORDER — OXYCODONE-ACETAMINOPHEN 5-325 MG PO TABS
1.0000 | ORAL_TABLET | Freq: Once | ORAL | Status: AC
  Administered 2023-10-12: 1 via ORAL
  Filled 2023-10-12: qty 1

## 2023-10-12 NOTE — Discharge Instructions (Addendum)
Follow-up with your primary care doctor. Return here for new concerns.

## 2023-10-12 NOTE — ED Provider Notes (Signed)
[place] EMERGENCY DEPARTMENT AT [place] Provider Note   CSN: [id number] Arrival date & time: 10/11/23  1553     History  Chief Complaint  Patient presents with   Nasal Congestion   Foot Pain    Ryan [name] is a 62 y.o. male.  The history is provided by the patient and medical records.  Foot Pain   62 year old male with history of A-fib on Eliquis, arthritis, substance abuse, HTN, DM2, presenting to the ED for joint paints.  Patient reports pain in left shoulder, elbow, and right foot.  States ongoing for quite some time now.  He denies any injury/trauma/falls.  No fever/chills.  States saw PCP today who prescribed him medications, however did not pick it up and is not sure what it is.  He has not tried any OTC for his symptoms either.  Home Medications Prior to Admission medications   Medication Sig Start Date End Date Taking? Authorizing Provider  apixaban (ELIQUIS) 5 MG TABS tablet Take 1 tablet (5 mg total) by mouth 2 (two) times daily. 04/26/23 07/25/23  Darlin Drop, DO  diltiazem (CARDIZEM CD) 240 MG 24 hr capsule Take 1 capsule (240 mg total) by mouth daily. 04/26/23   Darlin Drop, DO  glipiZIDE (GLUCOTROL) 10 MG tablet Take 10 mg by mouth daily. 03/04/21   [provider]  insulin NPH-regular Human (NOVOLIN 70/30) (70-30) 100 UNIT/ML injection Inject 35 units into the skin 2 times daily with a meal. Patient taking differently: Inject 45 Units into the skin 2 (two) times daily with a meal. 07/25/20   Marjie Skiff E, PA-C  losartan (COZAAR) 50 MG tablet Take 1 tablet (50 mg total) by mouth daily. 04/26/23 05/26/23  Darlin Drop, DO  omeprazole (PRILOSEC) 40 MG capsule Take 40 mg by mouth daily. 01/07/21   [provider]  promethazine-dextromethorphan (PROMETHAZINE-DM) 6.25-15 MG/5ML syrup Take 5 mLs by mouth 4 (four) times daily as needed for cough. 04/11/23   Rising, Lurena Joiner, PA-C  ZYRTEC ALLERGY 10 MG tablet Take 10 mg by  mouth in the morning. 03/03/20   [provider]      Allergies    Patient has no known allergies.    Review of Systems   Review of Systems  Musculoskeletal:  Positive for arthralgias.  All other systems reviewed and are negative.   Physical Exam Updated Vital Signs BP 125/84 (BP Location: Right Arm)   Pulse 79   Temp 98.1 F (36.7 C) (Oral)   Resp 17   SpO2 96%   Physical Exam Vitals and nursing note reviewed.  Constitutional:      Appearance: He is well-developed.     Comments: Bug crawled out of jacket during exam, captured in specimen cup  HENT:     Head: Normocephalic and atraumatic.  Eyes:     Conjunctiva/sclera: Conjunctivae normal.     Pupils: Pupils are equal, round, and reactive to light.  Cardiovascular:     Rate and Rhythm: Normal rate and regular rhythm.     Heart sounds: Normal heart sounds.  Pulmonary:     Effort: Pulmonary effort is normal.     Breath sounds: Normal breath sounds.  Abdominal:     General: Bowel sounds are normal.     Palpations: Abdomen is soft.  Musculoskeletal:        General: Normal range of motion.     Cervical back: Normal range of motion.     Comments: No noted swelling/deformities  to left shoulder or elbow, able to range joints without much issue, not really tender on exam, arms are NVI bilaterally, equal grips Right foot with callused area to medial heel, this is not erythematous, swollen, or warm to touch, no open skin/breakdown present  Skin:    General: Skin is warm and dry.  Neurological:     Mental Status: He is alert and oriented to person, place, and time.     ED Results / Procedures / Treatments   Labs (all labs ordered are listed, but only abnormal results are displayed) Labs Reviewed - No data to display  EKG None  Radiology No results found.  Procedures Procedures    Medications Ordered in ED Medications - No data to display  ED Course/ Medical Decision Making/ A&P                                  Medical Decision Making Risk Prescription drug management.   Impression(s) / ED Diagnoses:  62 year old male presenting to the ED with various joint pain--left shoulder, elbow, and right foot.  Ongoing for quite some time.  No new injuries, trauma, or falls.  Seen by PCP earlier today and given prescription, however did not pick this up and has not tried anything over-the-counter.  He does not have any significant joint swelling, erythema, or effusions noted on exam.  No acute deformities.  His extremities are neurovascularly intact.  Right foot has callused area but this is not ulcerated and there is no apparent skin breakdown.  He does not have any clinical signs or symptoms suggestive of septic joint at this time.  Suspect this is likely arthritic.  Doubt acute fractures, do not feel x-rays are indicated emergently.  Will given dose of medication here.  Recommended to pick up meds that were prescribed earlier, can follow-up with PCP.  Return here for new concerns.  Final diagnoses:  Arthralgia, unspecified joint    Rx / DC Orders ED Discharge Orders     None         Garlon Hatchet, PA-C 10/12/23 1610    Glynn Octave, MD 10/12/23 [phone]

## 2023-10-12 NOTE — ED Notes (Signed)
ED Provider at bedside. 

## 2023-10-24 ENCOUNTER — Institutional Professional Consult (permissible substitution): Payer: 59 | Admitting: Cardiology

## 2023-11-14 ENCOUNTER — Encounter (HOSPITAL_BASED_OUTPATIENT_CLINIC_OR_DEPARTMENT_OTHER): Payer: 59 | Attending: Internal Medicine | Admitting: Internal Medicine

## 2023-11-14 DIAGNOSIS — E11621 Type 2 diabetes mellitus with foot ulcer: Secondary | ICD-10-CM | POA: Diagnosis present

## 2023-11-14 DIAGNOSIS — I4891 Unspecified atrial fibrillation: Secondary | ICD-10-CM | POA: Diagnosis not present

## 2023-11-14 DIAGNOSIS — N183 Chronic kidney disease, stage 3 unspecified: Secondary | ICD-10-CM | POA: Diagnosis not present

## 2023-11-14 DIAGNOSIS — I87331 Chronic venous hypertension (idiopathic) with ulcer and inflammation of right lower extremity: Secondary | ICD-10-CM | POA: Diagnosis not present

## 2023-11-14 DIAGNOSIS — I129 Hypertensive chronic kidney disease with stage 1 through stage 4 chronic kidney disease, or unspecified chronic kidney disease: Secondary | ICD-10-CM | POA: Insufficient documentation

## 2023-11-14 DIAGNOSIS — E1122 Type 2 diabetes mellitus with diabetic chronic kidney disease: Secondary | ICD-10-CM | POA: Diagnosis not present

## 2023-11-14 DIAGNOSIS — L97512 Non-pressure chronic ulcer of other part of right foot with fat layer exposed: Secondary | ICD-10-CM | POA: Diagnosis not present

## 2023-11-14 DIAGNOSIS — I872 Venous insufficiency (chronic) (peripheral): Secondary | ICD-10-CM | POA: Diagnosis not present

## 2023-11-14 DIAGNOSIS — Z794 Long term (current) use of insulin: Secondary | ICD-10-CM | POA: Diagnosis not present

## 2023-11-14 DIAGNOSIS — L97518 Non-pressure chronic ulcer of other part of right foot with other specified severity: Secondary | ICD-10-CM | POA: Diagnosis not present

## 2023-11-14 DIAGNOSIS — M1990 Unspecified osteoarthritis, unspecified site: Secondary | ICD-10-CM | POA: Insufficient documentation

## 2023-11-14 DIAGNOSIS — E1165 Type 2 diabetes mellitus with hyperglycemia: Secondary | ICD-10-CM | POA: Insufficient documentation

## 2023-11-16 NOTE — Progress Notes (Signed)
Ryan [name], Ryan [name] ([id number]) [id number] Page 1 of 9 Visit Report for 11/14/2023 Allergy List Details Patient Name: Date of Service: Ryan [name], Ryan [name] 11/14/2023 2:30 PM Medical Record Number: [id number] Patient Account Number: [id number] Date of Birth/Sex: Treating RN: 23-Dec-1960 (62 y.o. Marlan Palau Primary Care Nayomi Tabron: [name] Other Clinician: Referring Jazzmon Prindle: Treating Kessie Croston/Extender: Nanine Means in Treatment: 0 Allergies Active Allergies No Known Allergies Allergy Notes Electronic Signature(s) Signed: 11/16/2023 11:53:29 AM By: Zenaida Deed RN, BSN Entered By: Zenaida Deed on 11/14/2023 12:01:32 -------------------------------------------------------------------------------- Arrival Information Details Patient Name: Date of Service: Ryan [name], Ryan [name] 11/14/2023 2:30 PM Medical Record Number: [id number] Patient Account Number: [id number] Date of Birth/Sex: Treating RN: 1961-10-05 (62 y.o. M) Primary Care Walta [place]: [name] Other Clinician: Referring Sohil Timko: Treating Giovannie Scerbo/Extender: Nanine Means in Treatment: 0 Visit Information Patient Arrived: Ambulatory Arrival Time: 14:54 Accompanied By: self Transfer Assistance: None Patient Identification Verified: Yes Secondary Verification Process Completed: Yes Patient Requires Transmission-Based Precautions: No Patient Has Alerts: Yes Patient Alerts: Patient on Blood Thinner History Since Last Visit Added or deleted any medications: No Any new allergies or adverse reactions: No Had a fall or experienced change in activities of daily living that may affect risk of falls: No Signs or symptoms of abuse/neglect since last visito No Hospitalized since last visit: No Implantable device outside of the clinic excluding cellular tissue based products placed in the center since last visit: No Electronic  Signature(s) Signed: 11/16/2023 11:53:29 AM By: Zenaida Deed RN, BSN Entered By: Zenaida Deed on 11/14/2023 12:00:56 Ryan [name] ([id number]) [id number] Page 2 of 9 -------------------------------------------------------------------------------- Clinic Level of Care Assessment Details Patient Name: Date of Service: Ryan [name], Ryan [name] 11/14/2023 2:30 PM Medical Record Number: [id number] Patient Account Number: [id number] Date of Birth/Sex: Treating RN: [birth year] (62 y.o. Damaris Schooner Primary Care Ezra Marquess: [name] Other Clinician: Referring Yolinda Duerr: Treating Tushar Enns/Extender: Nanine Means in Treatment: 0 Clinic Level of Care Assessment Items TOOL 1 Quantity Score []  - 0 Use when EandM and Procedure is performed on INITIAL visit ASSESSMENTS - Nursing Assessment / Reassessment X- 1 20 General Physical Exam (combine w/ comprehensive assessment (listed just below) when performed on new pt. evals) X- 1 25 Comprehensive Assessment (HX, ROS, Risk Assessments, Wounds Hx, etc.) ASSESSMENTS - Wound and Skin Assessment / Reassessment []  - 0 Dermatologic / Skin Assessment (not related to wound area) ASSESSMENTS - Ostomy and/or Continence Assessment and Care []  - 0 Incontinence Assessment and Management []  - 0 Ostomy Care Assessment and Management (repouching, etc.) PROCESS - Coordination of Care X - Simple Patient / Family Education for ongoing care 1 15 []  - 0 Complex (extensive) Patient / Family Education for ongoing care X- 1 10 Staff obtains Chiropractor, Records, T Results / Process Orders est []  - 0 Staff telephones HHA, Nursing Homes / Clarify orders / etc []  - 0 Routine Transfer to another Facility (non-emergent condition) []  - 0 Routine Hospital Admission (non-emergent condition) X- 1 15 New Admissions / Manufacturing engineer / Ordering NPWT Apligraf, etc. , []  - 0 Emergency Hospital Admission  (emergent condition) PROCESS - Special Needs []  - 0 Pediatric / Minor Patient Management []  - 0 Isolation Patient Management []  - 0 Hearing / Language / Visual special needs []  - 0 Assessment of Community assistance (transportation, D/C planning, etc.) []  - 0 Additional assistance / Altered mentation []  - 0 Support Surface(s) Assessment (bed, cushion, seat, etc.) INTERVENTIONS - Miscellaneous []  -  0 External ear exam []  - 0 Patient Transfer (multiple staff / Nurse, adult / Similar devices) []  - 0 Simple Staple / Suture removal (25 or less) []  - 0 Complex Staple / Suture removal (26 or more) []  - 0 Hypo/Hyperglycemic Management (do not check if billed separately) X- 1 15 Ankle / Brachial Index (ABI) - do not check if billed separately Has the patient been seen at the hospital within the last three years: Yes Total Score: 100 Level Of Care: New/Established - Level 3 Electronic Signature(s) Signed: 11/16/2023 11:53:29 AM By: Zenaida Deed RN, BSN Entered By: Zenaida Deed on 11/14/2023 13:06:24 Ryan [name] ([id number]) [id number] Page 3 of 9 -------------------------------------------------------------------------------- Compression Therapy Details Patient Name: Date of Service: Ryan [name], Ryan [name] 11/14/2023 2:30 PM Medical Record Number: [id number] Patient Account Number: [id number] Date of Birth/Sex: Treating RN: 01/15/1961 (62 y.o. Damaris Schooner Primary Care Maryclaire Stoecker: [name] Other Clinician: Referring Cricket Goodlin: Treating Tyrin Herbers/Extender: Nanine Means in Treatment: 0 Compression Therapy Performed for Wound Assessment: Wound #2 Right,Medial Foot Performed By: Clinician Zenaida Deed, RN Compression Type: Double Layer Post Procedure Diagnosis Same as Pre-procedure Notes Jeryl Columbia Electronic Signature(s) Signed: 11/16/2023 11:53:29 AM By: Zenaida Deed RN, BSN Entered By: Zenaida Deed on  11/14/2023 12:43:11 -------------------------------------------------------------------------------- Encounter Discharge Information Details Patient Name: Date of Service: Ryan [name], Ryan [name] 11/14/2023 2:30 PM Medical Record Number: [id number] Patient Account Number: [id number] Date of Birth/Sex: Treating RN: 10-15-1961 (62 y.o. Damaris Schooner Primary Care Rashaan Wyles: [name] Other Clinician: Referring Imaya Duffy: Treating Haider Hornaday/Extender: Nanine Means in Treatment: 0 Encounter Discharge Information Items Discharge Condition: Stable Ambulatory Status: Ambulatory Discharge Destination: Home Transportation: Other Accompanied By: self Schedule Follow-up Appointment: Yes Clinical Summary of Care: Patient Declined Notes bus Electronic Signature(s) Signed: 11/16/2023 11:53:29 AM By: Zenaida Deed RN, BSN Entered By: Zenaida Deed on 11/14/2023 13:07:16 -------------------------------------------------------------------------------- Lower Extremity Assessment Details Patient Name: Date of Service: Ryan [name], Ryan [name] 11/14/2023 2:30 PM Medical Record Number: [id number] Patient Account Number: [id number] TRYPP, SHEELEY ([id number]) [phone].pdf Page 4 of 9 Date of Birth/Sex: Treating RN: March 01, 1961 (62 y.o. Damaris Schooner Primary Care Trooper Olander: [name] Other Clinician: Referring Jaretssi Kraker: Treating Draylen Lobue/Extender: Nanine Means in Treatment: 0 Edema Assessment Assessed: Kyra Searles: No] [Right: No] Edema: [Left: Ye] [Right: s] Calf Left: Right: Point of Measurement: From Medial Instep 40.5 cm Ankle Left: Right: Point of Measurement: From Medial Instep 26.5 cm Knee To Floor Left: Right: From Medial Instep 50 cm Vascular Assessment Pulses: Dorsalis Pedis Palpable: [Right:Yes] Extremity colors, hair growth, and conditions: Extremity Color: [Right:Hyperpigmented] Hair Growth on  Extremity: [Right:No] Temperature of Extremity: [Right:Warm] Capillary Refill: [Right:< 3 seconds] Dependent Rubor: [Right:No] Blanched when Elevated: [Right:No] Lipodermatosclerosis: [Right:No] Blood Pressure: Brachial: [Right:160] Ankle: [Right:Dorsalis Pedis: 164 1.02] Toe Nail Assessment Left: Right: Thick: No Discolored: Yes Deformed: Yes Improper Length and Hygiene: Yes Electronic Signature(s) Signed: 11/16/2023 11:53:29 AM By: Zenaida Deed RN, BSN Entered By: Zenaida Deed on 11/14/2023 12:18:40 -------------------------------------------------------------------------------- Multi Wound Chart Details Patient Name: Date of Service: Ryan [name] 11/14/2023 2:30 PM Medical Record Number: [id number] Patient Account Number: [id number] Date of Birth/Sex: Treating RN: 09-23-1961 (62 y.o. M) Primary Care Margurite Duffy: [name] Other Clinician: Referring Kentaro Alewine: Treating Heinrich Fertig/Extender: Nanine Means in Treatment: 0 Vital Signs Height(in): 69 Capillary Blood Glucose(mg/dl): 542 Weight(lbs): 706 Pulse(bpm): 87 Body Mass Index(BMI): 34.8 Blood Pressure(mmHg): 160/97 Temperature(F): 98.2 Butson, Vikash [name] ([id number]) [id number] Page 5 of 9 Respiratory Rate(breaths/min):  18 [2:Photos:] [N/A:N/A] Medial Foot N/A N/A Wound Location: Not Known N/A N/A Wounding Event: T be determined o N/A N/A Primary Etiology: Cataracts, Angina, Arrhythmia, N/A N/A Comorbid History: Hypertension, Type II Diabetes, Osteoarthritis 09/19/2023 N/A N/A Date Acquired: 0 N/A N/A Weeks of Treatment: Open N/A N/A Wound Status: No N/A N/A Wound Recurrence: 1.2x0.9x0.1 N/A N/A Measurements L x W x D (cm) 0.848 N/A N/A A (cm) : rea 0.085 N/A N/A Volume (cm) : Full Thickness Without Exposed N/A N/A Classification: Support Structures Medium N/A N/A Exudate A mount: Serous N/A N/A Exudate Type: amber N/A N/A Exudate  Color: Distinct, outline attached N/A N/A Wound Margin: Medium (34-66%) N/A N/A Granulation Amount: Red N/A N/A Granulation Quality: Medium (34-66%) N/A N/A Necrotic Amount: Eschar N/A N/A Necrotic Tissue: Fat Layer (Subcutaneous Tissue): Yes N/A N/A Exposed Structures: Fascia: No Tendon: No Muscle: No Joint: No Bone: No Small (1-33%) N/A N/A Epithelialization: No Abnormalities Noted N/A N/A Periwound Skin Texture: No Abnormalities Noted N/A N/A Periwound Skin Moisture: Hemosiderin Staining: Yes N/A N/A Periwound Skin Color: No Abnormality N/A N/A Temperature: Yes N/A N/A Tenderness on Palpation: Compression Therapy N/A N/A Procedures Performed: Treatment Notes Wound #2 (Foot) Wound Laterality: Right, Medial Cleanser Peri-Wound Care Triamcinolone 15 (g) Discharge Instruction: Use triamcinolone 15 (g) as directed Sween Lotion (Moisturizing lotion) Discharge Instruction: Apply moisturizing lotion as directed Topical Primary Dressing Hydrofera Blue Ready Transfer Foam, 2.5x2.5 (in/in) Discharge Instruction: Apply directly to wound bed as directed Secondary Dressing Woven Gauze Sponge, Non-Sterile 4x4 in Discharge Instruction: Apply over primary dressing as directed. Secured With Compression Wrap Urgo K2, (equivalent to a 4 layer) two layer compression system, regular Discharge Instruction: Apply Urgo K2 as directed (alternative to 4 layer compression). Compression Stockings CLARKSON, CZAPLICKI [name] ([id number]) [id number] Page 6 of 9 Add-Ons Electronic Signature(s) Signed: 11/14/2023 5:24:09 PM By: Baltazar Najjar MD Entered By: Baltazar Najjar on 11/14/2023 13:59:38 -------------------------------------------------------------------------------- Multi-Disciplinary Care Plan Details Patient Name: Date of Service: Ryan [name], [place] [facility] [place] [name] 11/14/2023 2:30 PM Medical Record Number: [id number] Patient Account Number: [id number] Date of Birth/Sex:  Treating RN: 1961/01/23 (62 y.o. Damaris Schooner Primary Care Juvia Aerts: [name] Other Clinician: Referring Gardiner Espana: Treating Avani Sensabaugh/Extender: Nanine Means in Treatment: 0 Multidisciplinary Care Plan reviewed with physician Active Inactive Nutrition Nursing Diagnoses: Impaired glucose control: actual or potential Potential for alteratiion in Nutrition/Potential for imbalanced nutrition Goals: Patient/caregiver will maintain therapeutic glucose control Date Initiated: 11/14/2023 Target Resolution Date: 12/12/2023 Goal Status: Active Interventions: Assess HgA1c results as ordered upon admission and as needed Assess patient nutrition upon admission and as needed per policy Provide education on elevated blood sugars and impact on wound healing Treatment Activities: Patient referred to Primary Care Physician for further nutritional evaluation : 11/14/2023 Notes: Wound/Skin Impairment Nursing Diagnoses: Impaired tissue integrity Knowledge deficit related to ulceration/compromised skin integrity Goals: Patient/caregiver will verbalize understanding of skin care regimen Date Initiated: 11/14/2023 Target Resolution Date: 12/12/2023 Goal Status: Active Ulcer/skin breakdown will have a volume reduction of 30% by week 4 Date Initiated: 11/14/2023 Target Resolution Date: 12/12/2023 Goal Status: Active Interventions: Assess patient/caregiver ability to obtain necessary supplies Assess patient/caregiver ability to perform ulcer/skin care regimen upon admission and as needed Assess ulceration(s) every visit Provide education on ulcer and skin care Treatment Activities: Skin care regimen initiated : 11/14/2023 Topical wound management initiated : 11/14/2023 Notes: YEHYA, ZILE ([id number]) [phone].pdf Page 7 of 9 Electronic Signature(s) Signed: 11/16/2023 11:53:29 AM By: Zenaida Deed RN, BSN Entered By: Zenaida Deed on  11/14/2023  12:38:43 -------------------------------------------------------------------------------- Pain Assessment Details Patient Name: Date of Service: AMBERS, THIBERT [name] 11/14/2023 2:30 PM Medical Record Number: [id number] Patient Account Number: [id number] Date of Birth/Sex: Treating RN: 01-Apr-1961 (62 y.o. Damaris Schooner Primary Care Edilberto Roosevelt: [name] Other Clinician: Referring Shannin Naab: Treating Laquiesha Piacente/Extender: Nanine Means in Treatment: 0 Active Problems Location of Pain Severity and Description of Pain Patient Has Paino Yes Site Locations Pain Location: Pain in Ulcers With Dressing Change: Yes Duration of the Pain. Constant / Intermittento Constant Rate the pain. Current Pain Level: 10 Worst Pain Level: 10 Least Pain Level: 6 Character of Pain Describe the Pain: Throbbing Pain Management and Medication Current Pain Management: Medication: Yes Is the Current Pain Management Adequate: Adequate How does your wound impact your activities of daily livingo Sleep: Yes Bathing: No Appetite: No Relationship With Others: No Bladder Continence: No Emotions: Yes Bowel Continence: No Work: No Toileting: No Drive: No Dressing: No Hobbies: No Electronic Signature(s) Signed: 11/16/2023 11:53:29 AM By: Zenaida Deed RN, BSN Entered By: Zenaida Deed on 11/14/2023 12:23:33 Ryan [name] ([id number]) [id number] Page 8 of 9 -------------------------------------------------------------------------------- Patient/Caregiver Education Details Patient Name: Date of Service: MORTEZ, MEADOWCROFT [facility] 12/2/2024andnbsp2:30 PM Medical Record Number: [id number] Patient Account Number: [id number] Date of Birth/Gender: Treating RN: February 18, 1961 (62 y.o. Damaris Schooner Primary Care Physician: [name] Other Clinician: Referring Physician: Treating Physician/Extender: Nanine Means in  Treatment: 0 Education Assessment Education Provided To: Patient Education Topics Provided Elevated Blood Sugar/ Impact on Healing: Handouts: Elevated Blood Sugars: How Do They Affect Wound Healing Methods: Explain/Verbal, Printed Responses: Reinforcements needed, State content correctly Wound/Skin Impairment: Handouts: Caring for Your Ulcer Methods: Explain/Verbal, Printed Responses: Reinforcements needed, State content correctly Electronic Signature(s) Signed: 11/16/2023 11:53:29 AM By: Zenaida Deed RN, BSN Entered By: Zenaida Deed on 11/14/2023 12:39:51 -------------------------------------------------------------------------------- Wound Assessment Details Patient Name: Date of Service: JULLIEN, FIELD[place] [name] 11/14/2023 2:30 PM Medical Record Number: [id number] Patient Account Number: [id number] Date of Birth/Sex: Treating RN: 1961/04/15 (62 y.o. M) Primary Care Loveah Like: [name] Other Clinician: Referring Cyan Moultrie: Treating Eryn Krejci/Extender: Nanine Means in Treatment: 0 Wound Status Wound Number: 2 Primary T be determined o Etiology: Wound Location: Medial Foot Wound Status: Open Wounding Event: Not Known Comorbid Cataracts, Angina, Arrhythmia, Hypertension, Type II Date Acquired: 09/19/2023 History: Diabetes, Osteoarthritis Weeks Of Treatment: 0 Clustered Wound: No Photos Wound Measurements Length: (cm) 1.2 Width: (cm) 0.9 Deshotel, Kmarion [name] ([id number]) Depth: (cm) 0. Area: (cm) 0 Volume: (cm) 0 % Reduction in Area: % Reduction in Volume: [id number] Page 9 of 9 1 Epithelialization: Small (1-33%) .848 Tunneling: No .085 Undermining: No Wound Description Classification: Full Thickness Without Exposed Suppo Wound Margin: Distinct, outline attached Exudate Amount: Medium Exudate Type: Serous Exudate Color: amber rt Structures Foul Odor After Cleansing: No Slough/Fibrino Yes Wound  Bed Granulation Amount: Medium (34-66%) Exposed Structure Granulation Quality: Red Fascia Exposed: No Necrotic Amount: Medium (34-66%) Fat Layer (Subcutaneous Tissue) Exposed: Yes Necrotic Quality: Eschar Tendon Exposed: No Muscle Exposed: No Joint Exposed: No Bone Exposed: No Periwound Skin Texture Texture Color No Abnormalities Noted: Yes No Abnormalities Noted: No Hemosiderin Staining: Yes Moisture No Abnormalities Noted: Yes Temperature / Pain Temperature: No Abnormality Tenderness on Palpation: Yes Electronic Signature(s) Signed: 11/16/2023 11:53:29 AM By: Zenaida Deed RN, BSN Entered By: Zenaida Deed on 11/14/2023 12:19:47 -------------------------------------------------------------------------------- Vitals Details Patient Name: Date of Service: Ryan [name] 11/14/2023 2:30 PM Medical Record Number: [id number] Patient Account Number: [id number] Date  of Birth/Sex: Treating RN: 07/20/1961 (62 y.o. M) Primary Care Andreal Vultaggio: [name] Other Clinician: Referring Harris Penton: Treating Lita Flynn/Extender: Nanine Means in Treatment: 0 Vital Signs Time Taken: 02:54 Temperature (F): 98.2 Height (in): 69 Pulse (bpm): 87 Weight (lbs): 236 Respiratory Rate (breaths/min): 18 Body Mass Index (BMI): 34.8 Blood Pressure (mmHg): 160/97 Capillary Blood Glucose (mg/dl): 914 Reference Range: 80 - 120 mg / dl Notes glucose per pt report this am Electronic Signature(s) Signed: 11/16/2023 11:53:29 AM By: Zenaida Deed RN, BSN Entered By: Zenaida Deed on 11/14/2023 12:01:24

## 2023-11-16 NOTE — Progress Notes (Signed)
Ryan [name] ([id number]) [id number] Page 1 of 4 Visit Report for 11/14/2023 Abuse Risk Screen Details Patient Name: Date of Service: Ryan [name], Ryan [name] [facility] [name] 11/14/2023 2:30 PM Medical Record Number: [id number] Patient Account Number: [id number] Date of Birth/Sex: Treating RN: [name] (62 y.o. Ryan [name] Primary Care La Dibella: Ryan [name] Other Clinician: Referring Divina Neale: Treating Ly Wass/Extender: Nanine Means in Treatment: 0 Abuse Risk Screen Items Answer ABUSE RISK SCREEN: Has anyone close to you tried to hurt or harm you recentlyo No Do you feel uncomfortable with anyone in your familyo No Has anyone forced you do things that you didnt want to doo No Electronic Signature(s) Signed: 11/16/2023 11:53:29 AM By: Zenaida Deed RN, BSN Entered By: Zenaida Deed on 11/14/2023 12:06:37 -------------------------------------------------------------------------------- Activities of Daily Living Details Patient Name: Date of Service: Ryan [name], Ryan Ryan [name] 11/14/2023 2:30 PM Medical Record Number: [id number] Patient Account Number: [id number] Date of Birth/Sex: Treating RN: [birth year] (62 y.o. Ryan [name] Primary Care Dmitri Pettigrew: Ryan [name] Other Clinician: Referring Ekta Dancer: Treating [name]/Extender: Nanine Means in Treatment: 0 Activities of Daily Living Items Answer Activities of Daily Living (Please select one for each item) Drive Automobile Completely Able T Medications ake Completely Able Use T elephone Completely Able Care for Appearance Completely Able Use T oilet Completely Able Bath / Shower Completely Able Dress Self Completely Able Feed Self Completely Able Walk Completely Able Get In / Out Bed Completely Able Housework Completely Able Prepare Meals Completely Able Handle Money Completely Able Shop for Self Completely Able Electronic Signature(s) Signed:  11/16/2023 11:53:29 AM By: Zenaida Deed RN, BSN Entered By: Zenaida Deed on 11/14/2023 12:07:14 [name] ([id number]) [id number] Page 2 of 4 -------------------------------------------------------------------------------- Education Screening Details Patient Name: Date of Service: Ryan [name] 11/14/2023 2:30 PM Medical Record Number: [id number] Patient Account Number: [id number] Date of Birth/Sex: Treating RN: [birth year] (62 y.o. Ryan [name] Primary Care Nimra Puccinelli: Ryan [name] Other Clinician: Referring Dariona Postma: Treating [name]/Extender: Nanine Means in Treatment: 0 Primary Learner Assessed: Patient Learning Preferences/Education Level/Primary Language Learning Preference: Explanation, Demonstration, Video, Printed Material Highest Education Level: High School Preferred Language: English Cognitive Barrier Language Barrier: No Translator Needed: No Memory Deficit: No Emotional Barrier: No Cultural/Religious Beliefs Affecting Medical Care: No Physical Barrier Impaired Vision: Yes Glasses Impaired Hearing: No Decreased Hand dexterity: No Knowledge/Comprehension Knowledge Level: High Comprehension Level: High Ability to understand written instructions: High Ability to understand verbal instructions: High Motivation Anxiety Level: Calm Cooperation: Cooperative Education Importance: Acknowledges Need Interest in Health Problems: Asks Questions Perception: Coherent Willingness to Engage in Self-Management High Activities: Readiness to Engage in Self-Management High Activities: Electronic Signature(s) Signed: 11/16/2023 11:53:29 AM By: Zenaida Deed RN, BSN Entered By: Zenaida Deed on 11/14/2023 12:08:46 -------------------------------------------------------------------------------- Fall Risk Assessment Details Patient Name: Date of Service: Ryan [name], Ryan NTHO Ryan [name] 11/14/2023 2:30  PM Medical Record Number: [id number] Patient Account Number: [id number] Date of Birth/Sex: Treating RN: Ryan [name] (62 y.o. Ryan [name] Primary Care Sreekar Broyhill: Ryan [name] Other Clinician: Referring Cressie Betzler: Treating [name]/Extender: Nanine Means in Treatment: 0 Fall Risk Assessment Items Have you had 2 or more falls in the last 12 monthso 0 No Ryan, [name] ([id number]) [id number] Page 3 of 4 Have you had any fall that resulted in injury in the last 12 monthso 0 No FALLS RISK SCREEN History of falling - immediate or within 3 months 0 No Secondary diagnosis (Do you have 2 or more medical  diagnoseso) 0 No Ambulatory aid None/bed rest/wheelchair/nurse 0 Yes Crutches/cane/walker 0 No Furniture 0 No Intravenous therapy Access/Saline/Heparin Lock 0 No Gait/Transferring Normal/ bed rest/ wheelchair 0 Yes Weak (short steps with or without shuffle, stooped but able to lift head while walking, Ryan seek 0 No support from furniture) Impaired (short steps with shuffle, Ryan have difficulty arising from chair, head down, impaired 0 No balance) Mental Status Oriented to own ability 0 Yes Electronic Signature(s) Signed: 11/16/2023 11:53:29 AM By: Zenaida Deed RN, BSN Entered By: Zenaida Deed on 11/14/2023 12:09:50 -------------------------------------------------------------------------------- Foot Assessment Details Patient Name: Date of Service: Ryan [name], Ryan Ryan [name] 11/14/2023 2:30 PM Medical Record Number: [id number] Patient Account Number: [id number] Date of Birth/Sex: Treating RN: [name] (62 y.o. Ryan [name] Primary Care Daylene Vandenbosch: Ryan [name] Other Clinician: Referring Lorre Opdahl: Treating Alaynah Schutter/Extender: Nanine Means in Treatment: 0 Foot Assessment Items Site Locations + = Sensation present, - = Sensation absent, C = Callus, U = Ulcer [name] = Redness, W = Warmth, M =  Maceration, PU = Pre-ulcerative lesion F = Fissure, S = Swelling, D = Dryness Assessment Right: Left: Other Deformity: No No Prior Foot Ulcer: No No Prior Amputation: No No Charcot Joint: No No Ambulatory Status: Ambulatory Without Help GaitHERNANDEZ, Ryan [name] ([id number]) [id number] [id number] Page 4 of 4 Electronic Signature(s) Signed: 11/16/2023 11:53:29 AM By: Zenaida Deed RN, BSN Entered By: Zenaida Deed on 11/14/2023 12:13:35 -------------------------------------------------------------------------------- Nutrition Risk Screening Details Patient Name: Date of Service: Ryan [name], Ryan Ryan [name] 11/14/2023 2:30 PM Medical Record Number: [id number] Patient Account Number: [id number] Date of Birth/Sex: Treating RN: [name] (62 y.o. Ryan [name] Primary Care Damaso Laday: Ryan [name] Other Clinician: Referring Laval Cafaro: Treating Leib Elahi/Extender: Nanine Means in Treatment: 0 Height (in): 69 Weight (lbs): 236 Body Mass Index (BMI): 34.8 Nutrition Risk Screening Items Score Screening NUTRITION RISK SCREEN: I have an illness or condition that made me change the kind and/or amount of food I eat 0 No I eat fewer than two meals per day 0 No I eat few fruits and vegetables, or milk products 0 No I have three or more drinks of beer, liquor or wine almost every day 0 No I have tooth or mouth problems that make it hard for me to eat 0 No I don't always have enough money to buy the food I need 0 No I eat alone most of the time 1 Yes I take three or more different prescribed or over-the-counter drugs a day 1 Yes Without wanting to, I have lost or gained 10 pounds in the last six months 0 No I am not always physically able to shop, cook and/or feed myself 0 No Nutrition Protocols Good Risk Protocol 0 No interventions needed Moderate Risk Protocol High Risk Proctocol Risk Level: Good Risk Score: 2 Electronic  Signature(s) Signed: 11/16/2023 11:53:29 AM By: Zenaida Deed RN, BSN Entered By: Zenaida Deed on 11/14/2023 12:11:37

## 2023-11-16 NOTE — Progress Notes (Signed)
Ryan [name] ([id number]) [id number] Page 1 of 9 Visit Report for 11/14/2023 Chief Complaint Document Details Patient Name: Date of Service: Ryan [name], Ryan [name] [facility] [name] 11/14/2023 2:30 PM Medical Record Number: [id number] Patient Account Number: [id number] Date of Birth/Sex: Treating RN: 01-21-1961 (62 y.o. M) Primary Care Provider: [name] Other Clinician: Referring Provider: Treating Provider/Extender: Nanine Means in Treatment: 0 Information Obtained from: Patient Chief Complaint Patients presents for treatment of an open diabetic ulcer on his medial right lower leg 11/14/2023; patient returns to clinic with a wound on his right medial foot Electronic Signature(s) Signed: 11/14/2023 5:24:09 PM By: Ryan Najjar MD Entered By: Ryan [name] on 11/14/2023 14:00:04 -------------------------------------------------------------------------------- HPI Details Patient Name: Date of Service: Ryan [name] 11/14/2023 2:30 PM Medical Record Number: [id number] Patient Account Number: [id number] Date of Birth/Sex: Treating RN: Oct 28, 1961 (62 y.o. M) Primary Care Provider: [name] Other Clinician: Referring Provider: Treating Provider/Extender: Nanine Means in Treatment: 0 History of Present Illness HPI Description: ADMISSION 10/01/2022 This is a 62 year old poorly controlled type II diabetic (last hemoglobin A1c 10%) with hypertension and history of substance abuse who presents to clinic today with a wound on his right medial lower leg. He says that he is not entirely sure how it started, but it has been present for about a month. He says that the wound and the area surrounding it are exquisitely tender. His primary care provider prescribed a steroid cream which she has been using, but he says it has not helped. ABI in clinic today was noncompressible, but he has easily palpable pulses. On the  medial aspect of his right lower leg, there is a small wound exposing the fat layer. There is slough and eschar on the surface. There is no odor or purulent drainage. 09/23/2022: The PCR culture that I took last week grew out Escherichia coli. I prescribed a course of oral Augmentin and the patient is taking this currently. The wound is a little bit smaller and less tender. There is still slough and eschar buildup. 10/20; the patient's wound is closed today. He has chronic venous insufficiency. He did not come in with any compression stockings READMISSION 11/14/2023 This is a patient we had in the clinic in 2023 for 3 visits. At that time he had a wound on the right leg felt to be secondary to chronic venous insufficiency. He also has type 2 diabetes which is generally been poorly controlled. He was discharged with compression stockings. He comes in this time with a painful wound on the right medial foot which he said is has been there for about 2 to 3 months. Not really clear what he has been using on the wound but there is been no progress. He finds this very uncomfortable. Past medical history includes an Achilles tendon rupture that was repaired about 4 years ago, he has A-fib and flutter and was supposed to start Eliquis but he only recently got this through Medicaid. He has type 2 diabetes on insulin rate most recent hemoglobin A1c of 9.5 ABI in our clinic was 1.02 on the right Ryan [name] ([id number]) [id number] Page 2 of 9 Electronic Signature(s) Signed: 11/14/2023 5:24:09 PM By: Ryan Najjar MD Entered By: Ryan [name] on 11/14/2023 14:02:27 -------------------------------------------------------------------------------- Physical Exam Details Patient Name: Date of Service: Ryan [name], Ryan [name] 11/14/2023 2:30 PM Medical Record Number: [id number] Patient Account Number: [id number] Date of Birth/Sex: Treating RN: [birth year] (62 y.o. M) Primary  Care Provider: [name]  Other Clinician: Referring Provider: Treating Provider/Extender: Nanine Means in Treatment: 0 Constitutional Patient is hypertensive.. Pulse regular and within target range for patient.Marland Kitchen Respirations regular, non-labored and within target range.. Temperature is normal and within the target range for the patient.Marland Kitchen Appears in no distress. Cardiovascular Pedal pulses Present.. Skin changes of chronic venous insufficiency maximal on the right medial lower leg extending into the medial foot. Notes Clear skin changes of chronic venous insufficiency distal in the right medial leg extending into the medial foot. Dry fissured skin which is irritated. Probably some degree of chronic stasis dermatitis. His edema control is not terrible but given the location of the wound and the condition of his skin certainly is going to require compression. I see no evidence of surrounding infection I did not debride the wound. Electronic Signature(s) Signed: 11/14/2023 5:24:09 PM By: Ryan Najjar MD Entered By: Ryan [name] on 11/14/2023 14:05:26 -------------------------------------------------------------------------------- Physician Orders Details Patient Name: Date of Service: Ryan [name], Ryan [name] 11/14/2023 2:30 PM Medical Record Number: [id number] Patient Account Number: [id number] Date of Birth/Sex: Treating RN: November 25, 1961 (62 y.o. Damaris Schooner Primary Care Provider: [name] Other Clinician: Referring Provider: Treating Provider/Extender: Nanine Means in Treatment: 0 The following information was scribed by: Zenaida Deed The information was scribed for: Duanne Guess Verbal / Phone Orders: No Diagnosis Coding Follow-up Appointments ppointment in 1 week. - Dr. Leanord Hawking Return A Anesthetic Wound #2 Right,Medial Foot (In clinic) Topical Lidocaine 4% applied to wound bed Bathing/ Shower/ Hygiene May shower  with protection but do not get wound dressing(s) wet. Protect dressing(s) with water repellant cover (for example, large plastic bag) or a cast cover and may then take shower. - may purchase cast cover at [facility], [facility] or on-line Edema Control - Orders / Instructions Right Lower Extremity Elevate legs to the level of the heart or above for 30 minutes daily and/or when sitting for 3-4 times a day throughout the day. Avoid standing for long periods of time. Exercise regularly IYAAD, EDSELL [name] ([id number]) [id number] Page 3 of 9 Wound Treatment Wound #2 - Foot Wound Laterality: Right, Medial Peri-Wound Care: Triamcinolone 15 (g) 1 x Per Week/30 Days Discharge Instructions: Use triamcinolone 15 (g) as directed Peri-Wound Care: Sween Lotion (Moisturizing lotion) 1 x Per Week/30 Days Discharge Instructions: Apply moisturizing lotion as directed Prim Dressing: Hydrofera Blue Ready Transfer Foam, 2.5x2.5 (in/in) 1 x Per Week/30 Days ary Discharge Instructions: Apply directly to wound bed as directed Secondary Dressing: Woven Gauze Sponge, Non-Sterile 4x4 in 1 x Per Week/30 Days Discharge Instructions: Apply over primary dressing as directed. Compression Wrap: Urgo K2, (equivalent to a 4 layer) two layer compression system, regular 1 x Per Week/30 Days Discharge Instructions: Apply Urgo K2 as directed (alternative to 4 layer compression). Patient Medications llergies: No Known Allergies A Notifications Medication Indication Start End prior to debridement 11/14/2023 lidocaine DOSE topical 4 % cream - cream topical Electronic Signature(s) Signed: 11/14/2023 5:24:09 PM By: Ryan Najjar MD Signed: 11/16/2023 11:53:29 AM By: Zenaida Deed RN, BSN Entered By: Zenaida Deed on 11/14/2023 12:46:46 -------------------------------------------------------------------------------- Problem List Details Patient Name: Date of Service: Ryan [name], Ryan [name] 11/14/2023  2:30 PM Medical Record Number: [id number] Patient Account Number: [id number] Date of Birth/Sex: Treating RN: 1961/03/13 (62 y.o. M) Primary Care Provider: [name] Other Clinician: Referring Provider: Treating Provider/Extender: Nanine Means in Treatment: 0 Active Problems ICD-10 Encounter Code Description Active Date MDM Diagnosis E11.621 Type 2 diabetes mellitus with foot  ulcer 11/14/2023 No Yes L97.518 Non-pressure chronic ulcer of other part of right foot with other specified 11/14/2023 No Yes severity I87.331 Chronic venous hypertension (idiopathic) with ulcer and inflammation of right 11/14/2023 No Yes lower extremity Inactive Problems Resolved Problems ADARRIUS, LESKO ([id number]) [id number] Page 4 of 9 Electronic Signature(s) Signed: 11/14/2023 5:24:09 PM By: Ryan Najjar MD Entered By: Ryan [name] on 11/14/2023 13:03:49 -------------------------------------------------------------------------------- Progress Note Details Patient Name: Date of Service: Ryan [name], Ryan [name] 11/14/2023 2:30 PM Medical Record Number: [id number] Patient Account Number: [id number] Date of Birth/Sex: Treating RN: [birth year] (62 y.o. M) Primary Care Provider: [name] Other Clinician: Referring Provider: Treating Provider/Extender: Nanine Means in Treatment: 0 Subjective Chief Complaint Information obtained from Patient Patients presents for treatment of an open diabetic ulcer on his medial right lower leg 11/14/2023; patient returns to clinic with a wound on his right medial foot History of Present Illness (HPI) ADMISSION 10/01/2022 This is a 62 year old poorly controlled type II diabetic (last hemoglobin A1c 10%) with hypertension and history of substance abuse who presents to clinic today with a wound on his right medial lower leg. He says that he is not entirely sure how it started, but it has been  present for about a month. He says that the wound and the area surrounding it are exquisitely tender. His primary care provider prescribed a steroid cream which she has been using, but he says it has not helped. ABI in clinic today was noncompressible, but he has easily palpable pulses. On the medial aspect of his right lower leg, there is a small wound exposing the fat layer. There is slough and eschar on the surface. There is no odor or purulent drainage. 09/23/2022: The PCR culture that I took last week grew out Escherichia coli. I prescribed a course of oral Augmentin and the patient is taking this currently. The wound is a little bit smaller and less tender. There is still slough and eschar buildup. 10/20; the patient's wound is closed today. He has chronic venous insufficiency. He did not come in with any compression stockings READMISSION 11/14/2023 This is a patient we had in the clinic in 2023 for 3 visits. At that time he had a wound on the right leg felt to be secondary to chronic venous insufficiency. He also has type 2 diabetes which is generally been poorly controlled. He was discharged with compression stockings. He comes in this time with a painful wound on the right medial foot which he said is has been there for about 2 to 3 months. Not really clear what he has been using on the wound but there is been no progress. He finds this very uncomfortable. Past medical history includes an Achilles tendon rupture that was repaired about 4 years ago, he has A-fib and flutter and was supposed to start Eliquis but he only recently got this through Medicaid. He has type 2 diabetes on insulin rate most recent hemoglobin A1c of 9.5 ABI in our clinic was 1.02 on the right Patient History Information obtained from Patient, Chart. Allergies No Known Allergies Family History Hypertension - Mother, No family history of Cancer, Diabetes, Heart Disease, Hereditary Spherocytosis, Kidney Disease, Lung  Disease, Seizures, Stroke, Thyroid Problems, Tuberculosis. Social History Never smoker, Marital Status - Single, Alcohol Use - Moderate, Drug Use - Prior History - long time ago, Caffeine Use - Moderate. Medical History Eyes Patient has history of Cataracts - Bilateral Denies history of Glaucoma, Optic Neuritis Ear/Nose/Mouth/Throat Denies history of  Chronic sinus problems/congestion, Middle ear problems Hematologic/Lymphatic Denies history of Anemia, Hemophilia, Human Immunodeficiency Virus, Lymphedema, Sickle Cell Disease Respiratory Denies history of Aspiration, Asthma, Chronic Obstructive Pulmonary Disease (COPD), Pneumothorax, Sleep Apnea, Tuberculosis Cardiovascular Patient has history of Angina, Arrhythmia - a fib, a flutter, Hypertension NEDAL, SABORI [name] ([id number]) [id number] Page 5 of 9 Endocrine Patient has history of Type II Diabetes Denies history of Type I Diabetes Integumentary (Skin) Denies history of History of Burn Musculoskeletal Patient has history of Osteoarthritis Neurologic Denies history of Neuropathy Oncologic Denies history of Received Chemotherapy, Received Radiation Psychiatric Denies history of Anorexia/bulimia, Confinement Anxiety Patient is treated with Insulin. Blood sugar is tested. Hospitalization/Surgery History - Esophagogastroduodenoscopy- 2021. - Achilles tendon surgery 2015. - Cardiac catheterization 2021. - orbital fracture surgery 2000. Medical A Surgical History Notes nd Gastrointestinal umbilical hernia, esophagitis, GERD Genitourinary CKD stage III Musculoskeletal Arthritis in back and shoulders, Rupture of right Achilles tendon Psychiatric drug abuse Review of Systems (ROS) Constitutional Symptoms (General Health) Denies complaints or symptoms of Fatigue, Fever, Chills, Marked Weight Change. Eyes Complains or has symptoms of Glasses / Contacts. Denies complaints or symptoms of Dry Eyes, Vision  Changes. Ear/Nose/Mouth/Throat Denies complaints or symptoms of Chronic sinus problems or rhinitis. Respiratory Complains or has symptoms of Shortness of Breath. Denies complaints or symptoms of Chronic or frequent coughs. Cardiovascular Denies complaints or symptoms of Chest pain. Gastrointestinal Denies complaints or symptoms of Frequent diarrhea, Nausea, Vomiting. Genitourinary Denies complaints or symptoms of Frequent urination. Integumentary (Skin) Complains or has symptoms of Wounds - right foot. Musculoskeletal Denies complaints or symptoms of Muscle Pain, Muscle Weakness. Neurologic Denies complaints or symptoms of Numbness/parasthesias. Psychiatric Denies complaints or symptoms of Claustrophobia. Objective Constitutional Patient is hypertensive.. Pulse regular and within target range for patient.Marland Kitchen Respirations regular, non-labored and within target range.. Temperature is normal and within the target range for the patient.Marland Kitchen Appears in no distress. Vitals Time Taken: 2:54 AM, Height: 69 in, Weight: 236 lbs, BMI: 34.8, Temperature: 98.2 F, Pulse: 87 bpm, Respiratory Rate: 18 breaths/min, Blood Pressure: 160/97 mmHg, Capillary Blood Glucose: 135 mg/dl. General Notes: glucose per pt report this am Cardiovascular Pedal pulses Present.. Skin changes of chronic venous insufficiency maximal on the right medial lower leg extending into the medial foot. General Notes: Clear skin changes of chronic venous insufficiency distal in the right medial leg extending into the medial foot. Dry fissured skin which is irritated. Probably some degree of chronic stasis dermatitis. His edema control is not terrible but given the location of the wound and the condition of his skin certainly is going to require compression. I see no evidence of surrounding infection I did not debride the wound. Integumentary (Hair, Skin) Wound #2 status is Open. Original cause of wound was Not Known. The date acquired  was: 09/19/2023. The wound is located on the Right,Medial Foot. The wound measures 1.2cm length x 0.9cm width x 0.1cm depth; 0.848cm^2 area and 0.085cm^3 volume. There is Fat Layer (Subcutaneous Tissue) exposed. There is no tunneling or undermining noted. There is a medium amount of serous drainage noted. The wound margin is distinct with the outline attached to the wound base. There is medium (34-66%) red granulation within the wound bed. There is a medium (34-66%) amount of necrotic tissue within the wound bed including Eschar. The periwound skin appearance had no abnormalities noted for texture. The periwound skin appearance had no abnormalities noted for moisture. The periwound skin appearance exhibited: Hemosiderin Staining. Periwound temperature was noted as No Abnormality. The periwound has  tenderness on palpation. Ryan [name], Ryan [name] ([id number]) [id number] Page 6 of 9 Assessment Active Problems ICD-10 Type 2 diabetes mellitus with foot ulcer Non-pressure chronic ulcer of other part of right foot with other specified severity Chronic venous hypertension (idiopathic) with ulcer and inflammation of right lower extremity Procedures Wound #2 Pre-procedure diagnosis of Wound #2 is a Venous Leg Ulcer located on the Right,Medial Foot . There was a Double Layer Compression Therapy Procedure by Zenaida Deed, RN. Post procedure Diagnosis Wound #2: Same as Pre-Procedure Notes: urgo. Plan Follow-up Appointments: Return Appointment in 1 week. - Dr. Leanord Hawking Anesthetic: Wound #2 Right,Medial Foot: (In clinic) Topical Lidocaine 4% applied to wound bed Bathing/ Shower/ Hygiene: May shower with protection but do not get wound dressing(s) wet. Protect dressing(s) with water repellant cover (for example, large plastic bag) or a cast cover and may then take shower. - may purchase cast cover at [facility], [facility] or on-line Edema Control - Orders / Instructions: Elevate legs  to the level of the heart or above for 30 minutes daily and/or when sitting for 3-4 times a day throughout the day. Avoid standing for long periods of time. Exercise regularly The following medication(s) was prescribed: lidocaine topical 4 % cream cream topical for prior to debridement was prescribed at facility WOUND #2: - Foot Wound Laterality: Right, Medial Peri-Wound Care: Triamcinolone 15 (g) 1 x Per Week/30 Days Discharge Instructions: Use triamcinolone 15 (g) as directed Peri-Wound Care: Sween Lotion (Moisturizing lotion) 1 x Per Week/30 Days Discharge Instructions: Apply moisturizing lotion as directed Prim Dressing: Hydrofera Blue Ready Transfer Foam, 2.5x2.5 (in/in) 1 x Per Week/30 Days ary Discharge Instructions: Apply directly to wound bed as directed Secondary Dressing: Woven Gauze Sponge, Non-Sterile 4x4 in 1 x Per Week/30 Days Discharge Instructions: Apply over primary dressing as directed. Com pression Wrap: Urgo K2, (equivalent to a 4 layer) two layer compression system, regular 1 x Per Week/30 Days Discharge Instructions: Apply Urgo K2 as directed (alternative to 4 layer compression). 1. In spite of the location of this wound which is actually on the medial foot I think this is a venous insufficiency ulcer accompanied by very significant stasis dermatitis. 2. TCA around the wound, applied Hydrofera Blue, Urgo K2 wrap. 3. The patient is a diabetic although I do not think he has any arterial insufficiency issues. His poorly controlled diabetes certainly is adding to the issues healing this wound however I do not think this was the primary issue 4. NO antibiotics were felt to be necessary. Electronic Signature(s) Signed: 11/14/2023 5:24:09 PM By: Ryan Najjar MD Entered By: Ryan [name] on 11/14/2023 14:07:17 -------------------------------------------------------------------------------- HxROS Details Patient Name: Date of Service: Ryan [name] 11/14/2023  2:30 PM Medical Record Number: [id number] Patient Account Number: [id number] Ryan [name], Ryan [name] ([id number]) [id number] Page 7 of 9 Date of Birth/Sex: Treating RN: 28-Jan-1961 (62 y.o. Ryan [name] Primary Care Provider: [name] Other Clinician: Referring Provider: Treating Provider/Extender: Nanine Means in Treatment: 0 Information Obtained From Patient Chart Constitutional Symptoms (General Health) Complaints and Symptoms: Negative for: Fatigue; Fever; Chills; Marked Weight Change Eyes Complaints and Symptoms: Positive for: Glasses / Contacts Negative for: Dry Eyes; Vision Changes Medical History: Positive for: Cataracts - Bilateral Negative for: Glaucoma; Optic Neuritis Ear/Nose/Mouth/Throat Complaints and Symptoms: Negative for: Chronic sinus problems or rhinitis Medical History: Negative for: Chronic sinus problems/congestion; Middle ear problems Respiratory Complaints and Symptoms: Positive for: Shortness of Breath Negative for: Chronic or frequent coughs Medical History: Negative for: Aspiration; Asthma; Chronic  Obstructive Pulmonary Disease (COPD); Pneumothorax; Sleep Apnea; Tuberculosis Cardiovascular Complaints and Symptoms: Negative for: Chest pain Medical History: Positive for: Angina; Arrhythmia - a fib, a flutter; Hypertension Gastrointestinal Complaints and Symptoms: Negative for: Frequent diarrhea; Nausea; Vomiting Medical History: Past Medical History Notes: umbilical hernia, esophagitis, GERD Genitourinary Complaints and Symptoms: Negative for: Frequent urination Medical History: Past Medical History Notes: CKD stage III Integumentary (Skin) Complaints and Symptoms: Positive for: Wounds - right foot Medical History: Negative for: History of Burn Musculoskeletal Complaints and Symptoms: Negative for: Muscle Pain; Muscle Weakness Medical History: Positive for: Osteoarthritis Past  Medical History Notes: Arthritis in back and shoulders, Rupture of right Achilles tendon Ryan [name], Ryan [name] ([id number]) [id number] Page 8 of 9 Neurologic Complaints and Symptoms: Negative for: Numbness/parasthesias Medical History: Negative for: Neuropathy Psychiatric Complaints and Symptoms: Negative for: Claustrophobia Medical History: Negative for: Anorexia/bulimia; Confinement Anxiety Past Medical History Notes: drug abuse Hematologic/Lymphatic Medical History: Negative for: Anemia; Hemophilia; Human Immunodeficiency Virus; Lymphedema; Sickle Cell Disease Endocrine Medical History: Positive for: Type II Diabetes Negative for: Type I Diabetes Time with diabetes: over 30 years Treated with: Insulin Blood sugar tested every day: Yes Tested : 1 Immunological Oncologic Medical History: Negative for: Received Chemotherapy; Received Radiation HBO Extended History Items Eyes: Cataracts Immunizations Pneumococcal Vaccine: Received Pneumococcal Vaccination: No Implantable Devices None Hospitalization / Surgery History Type of Hospitalization/Surgery Esophagogastroduodenoscopy- 2021 Achilles tendon surgery 2015 Cardiac catheterization 2021 orbital fracture surgery 2000 Family and Social History Cancer: No; Diabetes: No; Heart Disease: No; Hereditary Spherocytosis: No; Hypertension: Yes - Mother; Kidney Disease: No; Lung Disease: No; Seizures: No; Stroke: No; Thyroid Problems: No; Tuberculosis: No; Never smoker; Marital Status - Single; Alcohol Use: Moderate; Drug Use: Prior History - long time ago; Caffeine Use: Moderate; Financial Concerns: No; Food, Clothing or Shelter Needs: No; Support System Lacking: No; Transportation Concerns: Yes - rides the bus Electronic Signature(s) Signed: 11/14/2023 5:08:50 PM By: Samuella Bruin Signed: 11/14/2023 5:24:09 PM By: Ryan Najjar MD Signed: 11/16/2023 11:53:29 AM By: Zenaida Deed RN,  BSN Entered By: Zenaida Deed on 11/14/2023 12:06:01 Ryan [name], Ryan [name] ([id number]) [id number] Page 9 of 9 -------------------------------------------------------------------------------- SuperBill Details Patient Name: Date of Service: Ryan [name], Ryan [name] [name] 11/14/2023 Medical Record Number: [id number] Patient Account Number: [id number] Date of Birth/Sex: Treating RN: 04-28-1961 (62 y.o. Damaris Schooner Primary Care Provider: [name] Other Clinician: Referring Provider: Treating Provider/Extender: Nanine Means in Treatment: 0 Diagnosis Coding ICD-10 Codes Code Description [id number] Type 2 diabetes mellitus with foot ulcer L97.518 Non-pressure chronic ulcer of other part of right foot with other specified severity I87.331 Chronic venous hypertension (idiopathic) with ulcer and inflammation of right lower extremity Facility Procedures : CPT4 Code: 41324401 Description: 99213 - WOUND CARE VISIT-LEV 3 EST PT Modifier: 25 Quantity: 1 : CPT4 Code: [id number] Description: (Facility Use Only) 44034VQ - APPLY MULTLAY COMPRS LWR RT LEG Modifier: Quantity: 1 Physician Procedures : CPT4 Code Description Modifier [id number] - WC PHYS LEVEL 4 - EST PT ICD-10 Diagnosis Description E11.621 Type 2 diabetes mellitus with foot ulcer L97.518 Non-pressure chronic ulcer of other part of right foot with other specified severity I87.331  Chronic venous hypertension (idiopathic) with ulcer and inflammation of right lower extremity Quantity: 1 Electronic Signature(s) Signed: 11/14/2023 5:24:09 PM By: Ryan Najjar MD Entered By: Ryan [name] on 11/14/2023 14:07:37

## 2023-11-24 ENCOUNTER — Encounter (HOSPITAL_BASED_OUTPATIENT_CLINIC_OR_DEPARTMENT_OTHER): Payer: 59 | Admitting: Internal Medicine

## 2023-11-24 DIAGNOSIS — I87331 Chronic venous hypertension (idiopathic) with ulcer and inflammation of right lower extremity: Secondary | ICD-10-CM | POA: Diagnosis not present

## 2023-11-24 DIAGNOSIS — E1165 Type 2 diabetes mellitus with hyperglycemia: Secondary | ICD-10-CM | POA: Diagnosis not present

## 2023-11-24 DIAGNOSIS — L97518 Non-pressure chronic ulcer of other part of right foot with other specified severity: Secondary | ICD-10-CM | POA: Diagnosis not present

## 2023-11-24 DIAGNOSIS — Z794 Long term (current) use of insulin: Secondary | ICD-10-CM | POA: Diagnosis not present

## 2023-11-24 DIAGNOSIS — L97512 Non-pressure chronic ulcer of other part of right foot with fat layer exposed: Secondary | ICD-10-CM | POA: Diagnosis not present

## 2023-11-24 DIAGNOSIS — I4891 Unspecified atrial fibrillation: Secondary | ICD-10-CM | POA: Diagnosis not present

## 2023-11-24 DIAGNOSIS — I872 Venous insufficiency (chronic) (peripheral): Secondary | ICD-10-CM | POA: Diagnosis not present

## 2023-11-24 DIAGNOSIS — E1122 Type 2 diabetes mellitus with diabetic chronic kidney disease: Secondary | ICD-10-CM | POA: Diagnosis not present

## 2023-11-24 DIAGNOSIS — N183 Chronic kidney disease, stage 3 unspecified: Secondary | ICD-10-CM | POA: Diagnosis not present

## 2023-11-24 DIAGNOSIS — E11621 Type 2 diabetes mellitus with foot ulcer: Secondary | ICD-10-CM | POA: Diagnosis not present

## 2023-11-24 DIAGNOSIS — M1990 Unspecified osteoarthritis, unspecified site: Secondary | ICD-10-CM | POA: Diagnosis not present

## 2023-11-24 DIAGNOSIS — I129 Hypertensive chronic kidney disease with stage 1 through stage 4 chronic kidney disease, or unspecified chronic kidney disease: Secondary | ICD-10-CM | POA: Diagnosis not present

## 2023-11-25 ENCOUNTER — Other Ambulatory Visit (HOSPITAL_COMMUNITY): Payer: Self-pay | Admitting: Internal Medicine

## 2023-11-25 ENCOUNTER — Ambulatory Visit (HOSPITAL_COMMUNITY): Admission: RE | Admit: 2023-11-25 | Payer: 59 | Source: Ambulatory Visit

## 2023-11-25 ENCOUNTER — Encounter (HOSPITAL_COMMUNITY): Payer: Self-pay

## 2023-11-25 DIAGNOSIS — S91302A Unspecified open wound, left foot, initial encounter: Secondary | ICD-10-CM

## 2023-11-25 NOTE — Progress Notes (Signed)
Ryan [name], Ryan [name] ([id number]) [id number] Page 1 of 8 Visit Report for 11/24/2023 Arrival Information Details Patient Name: Date of Service: Ryan [name], [name] Ryan [name] 11/24/2023 8:45 A M Medical Record Number: [id number] Patient Account Number: [id number] Date of Birth/Sex: Treating RN: 05-27-1961 (62 y.o. Ryan [name] Primary Care Ryan [name]: Ryan [name] Ryan [name]: Referring Ryan [name]: Treating Ryan [name]: Ryan [name] in Treatment: 1 Visit Information History Since Last Visit Added or deleted any medications: No Patient Arrived: Ambulatory Any new allergies or adverse reactions: No Arrival Time: 08:58 Had a fall or experienced change in No Accompanied By: self activities of daily living that may affect Transfer Assistance: None risk of falls: Patient Identification Verified: Yes Signs or symptoms of abuse/neglect since last visito No Patient Requires Transmission-Based Precautions: No Hospitalized since last visit: No Patient Has Alerts: Yes Implantable device outside of the clinic excluding No Patient Alerts: Patient on Blood Thinner cellular tissue based products placed in the center since last visit: Has Dressing in Place as Prescribed: Yes Has Compression in Place as Prescribed: Yes Pain Present Now: Yes Electronic Signature(s) Signed: 11/24/2023 5:43:57 PM By: Karie Schwalbe RN Entered By: Karie Schwalbe on 11/24/2023 09:11:16 -------------------------------------------------------------------------------- Compression Therapy Details Patient Name: Date of Service: Ryan [name], Ryan [name] [name] 11/24/2023 8:45 A M Medical Record Number: [id number] Patient Account Number: [id number] Date of Birth/Sex: Treating RN: [birth year] (62 y.o. Cline Cools Primary Care Delissa Silba: Ryan [name] Ryan [name]: Referring Joya Willmott: Treating Pamela Maddy/Extender: Ryan [name] in Treatment:  1 Compression Therapy Performed for Wound Assessment: Wound #2 Right,Medial Foot Performed By: [name] Ryan Pulling, RN Compression Type: Three Layer Post Procedure Diagnosis Same as Pre-procedure Electronic Signature(s) Signed: 11/24/2023 4:57:37 PM By: Ryan Pulling RN, BSN Entered By: Ryan [name] on 11/24/2023 09:32:17 Ryan [name], Ryan [name] ([id number]) [id number] Page 2 of 8 -------------------------------------------------------------------------------- Encounter Discharge Information Details Patient Name: Date of Service: Ryan [name], Ryan [name] [facility] Ryan [name] 11/24/2023 8:45 A M Medical Record Number: [id number] Patient Account Number: [id number] Date of Birth/Sex: Treating RN: [birth year] (62 y.o. Cline Cools Primary Care Starla Deller: Ryan [name] Ryan [name]: Referring Michaeljames Milnes: Treating Laron Angelini/Extender: Ryan [name] in Treatment: 1 Encounter Discharge Information Items Discharge Condition: Stable Ambulatory Status: Ambulatory Discharge Destination: Home Transportation: Private Auto Accompanied By: self Schedule Follow-up Appointment: Yes Clinical Summary of Care: Patient Declined Electronic Signature(s) Signed: 11/24/2023 4:57:37 PM By: Ryan Pulling RN, BSN Entered By: Ryan [name] on 11/24/2023 11:02:59 -------------------------------------------------------------------------------- Lower Extremity Assessment Details Patient Name: Date of Service: Ryan [name], Ryan [name] [name] [name] 11/24/2023 8:45 A M Medical Record Number: [id number] Patient Account Number: [id number] Date of Birth/Sex: Treating RN: 09/03/1961 (62 y.o. Ryan [name] Primary Care Hassani Sliney: Ryan [name] Ryan [name]: Referring Marlise Fahr: Treating Shandell Jallow/Extender: Ryan [name] in Treatment: 1 Edema Assessment Assessed: [Left: No] [Right: No] Edema: [Left: Ye] [Right: s] Calf Left: Right: Point of Measurement: From Medial  Instep 38 cm Ankle Left: Right: Point of Measurement: From Medial Instep 25 cm Vascular Assessment Pulses: Dorsalis Pedis Palpable: [Right:Yes] Extremity colors, hair growth, and conditions: Extremity Color: [Right:Hyperpigmented] Hair Growth on Extremity: [Right:No] Temperature of Extremity: [Right:Warm] Capillary Refill: [Right:< 3 seconds] Dependent Rubor: [Right:No No] Toe Nail Assessment Left: Right: Thick: Yes Discolored: No Deformed: No Improper Length and Hygiene: No Ryan [name], Ryan [name] ([id number]) [id number] Page 3 of 8 Electronic Signature(s) Signed: 11/24/2023 5:43:57 PM By: Karie Schwalbe RN Entered By: Karie Schwalbe on 11/24/2023 09:11:40 -------------------------------------------------------------------------------- Multi Wound Chart Details Patient Name: Date of Service: Ryan [name]  [name] 11/24/2023 8:45 A M Medical Record Number: [id number] Patient Account Number: [id number] Date of Birth/Sex: Treating RN: 1961/03/05 (62 y.o. M) Primary Care Lockie Bothun: Ryan [name] Ryan [name]: Referring Ichiro Chesnut: Treating Dixon Luczak/Extender: Ryan [name] in Treatment: 1 Vital Signs Height(in): 69 Capillary Blood Glucose(mg/dl): 102 Weight(lbs): 725 Pulse(bpm): 86 Body Mass Index(BMI): 34.8 Blood Pressure(mmHg): 147/85 Temperature(F): 97.8 Respiratory Rate(breaths/min): 18 [2:Photos:] [N/A:N/A] Right, Medial Foot N/A N/A Wound Location: Gradually Appeared N/A N/A Wounding Event: Venous Leg Ulcer N/A N/A Primary Etiology: Cataracts, Angina, Arrhythmia, N/A N/A Comorbid History: Hypertension, Type II Diabetes, Osteoarthritis 09/19/2023 N/A N/A Date Acquired: 1 N/A N/A Weeks of Treatment: Open N/A N/A Wound Status: No N/A N/A Wound Recurrence: 1x0.9x0.2 N/A N/A Measurements L x W x D (cm) 0.707 N/A N/A A (cm) : rea 0.141 N/A N/A Volume (cm) : 16.60% N/A N/A % Reduction in Area: -65.90% N/A  N/A % Reduction in Volume: Full Thickness Without Exposed N/A N/A Classification: Support Structures Medium N/A N/A Exudate A mount: Serous N/A N/A Exudate Type: amber N/A N/A Exudate Color: Distinct, outline attached N/A N/A Wound Margin: Medium (34-66%) N/A N/A Granulation Amount: Red, Pink N/A N/A Granulation Quality: Medium (34-66%) N/A N/A Necrotic Amount: Eschar N/A N/A Necrotic Tissue: Fat Layer (Subcutaneous Tissue): Yes N/A N/A Exposed Structures: Fascia: No Tendon: No Muscle: No Joint: No Bone: No Small (1-33%) N/A N/A Epithelialization: No Abnormalities Noted N/A N/A Periwound Skin Texture: No Abnormalities Noted N/A N/A Periwound Skin Moisture: Hemosiderin Staining: Yes N/A N/A Periwound Skin Color: No Abnormality N/A N/A Temperature: Yes N/A N/A Tenderness on Palpation: Compression Therapy N/A N/A Procedures Performed: HIDEO, TOBIASON ([id number]) [id number] Page 4 of 8 Treatment Notes Electronic Signature(s) Signed: 11/24/2023 5:17:16 PM By: Baltazar Najjar MD Entered By: Baltazar Najjar on 11/24/2023 09:37:09 -------------------------------------------------------------------------------- Multi-Disciplinary Care Plan Details Patient Name: Date of Service: Ryan [name], Ryan [name] [facility] [name] [name] 11/24/2023 8:45 A M Medical Record Number: [id number] Patient Account Number: [id number] Date of Birth/Sex: Treating RN: Jun 16, 1961 (63 y.o. Cline Cools Primary Care Chyla Schlender: Ryan [name] Ryan [name]: Referring Artrell Lawless: Treating Shakenna Herrero/Extender: Ryan [name] in Treatment: 1 Multidisciplinary Care Plan reviewed with physician Active Inactive Nutrition Nursing Diagnoses: Impaired glucose control: actual or potential Potential for alteratiion in Nutrition/Potential for imbalanced nutrition Goals: Patient/caregiver will maintain therapeutic glucose control Date Initiated: 11/14/2023 Target Resolution  Date: 12/12/2023 Goal Status: Active Interventions: Assess HgA1c results as ordered upon admission and as needed Assess patient nutrition upon admission and as needed per policy Provide education on elevated blood sugars and impact on wound healing Treatment Activities: Patient referred to Primary Care Physician for further nutritional evaluation : 11/14/2023 Notes: Wound/Skin Impairment Nursing Diagnoses: Impaired tissue integrity Knowledge deficit related to ulceration/compromised skin integrity Goals: Patient/caregiver will verbalize understanding of skin care regimen Date Initiated: 11/14/2023 Target Resolution Date: 12/12/2023 Goal Status: Active Ulcer/skin breakdown will have a volume reduction of 30% by week 4 Date Initiated: 11/14/2023 Target Resolution Date: 12/12/2023 Goal Status: Active Interventions: Assess patient/caregiver ability to obtain necessary supplies Assess patient/caregiver ability to perform ulcer/skin care regimen upon admission and as needed Assess ulceration(s) every visit Provide education on ulcer and skin care Treatment Activities: Skin care regimen initiated : 11/14/2023 Topical wound management initiated : 11/14/2023 Notes: Ryan [name], Ryan [name] ([id number]) [phone].pdf Page 5 of 8 Electronic Signature(s) Signed: 11/24/2023 4:57:37 PM By: Ryan Pulling RN, BSN Entered By: Ryan [name] on 11/24/2023 09:28:01 -------------------------------------------------------------------------------- Pain Assessment Details Patient Name: Date of Service: Ryan [name], Ryan [name] [facility] [name] [name] 11/24/2023 8:45 A M  Medical Record Number: [id number] Patient Account Number: [id number] Date of Birth/Sex: Treating RN: Jun 29, 1961 (62 y.o. Ryan [name] Primary Care Shanyia Stines: Ryan [name] Ryan [name]: Referring Nashira Mcglynn: Treating Theola Cuellar/Extender: Ryan [name] in Treatment: 1 Active Problems Location of Pain Severity and  Description of Pain Patient Has Paino Yes Site Locations Pain Location: Generalized Pain With Dressing Change: No Duration of the Pain. Constant / Intermittento Constant Rate the pain. Current Pain Level: 10 Worst Pain Level: 10 Least Pain Level: 7 Tolerable Pain Level: 7 Character of Pain Describe the Pain: Throbbing Pain Management and Medication Current Pain Management: Medication: Yes Cold Application: No Rest: Yes Massage: No Activity: No T.E.N.S.: No Heat Application: No Leg drop or elevation: No Is the Current Pain Management Adequate: Adequate Electronic Signature(s) Signed: 11/24/2023 5:43:57 PM By: Karie Schwalbe RN Entered By: Karie Schwalbe on 11/24/2023 09:11:09 -------------------------------------------------------------------------------- Patient/Caregiver Education Details Patient Name: Date of Service: Leola Brazil 12/12/2024andnbsp8:45 A M Medical Record Number: [id number] Patient Account Number: [id number] Date of Birth/Gender: Treating RN: [birth year] (62 y.o. Cline Cools Primary Care Physician: Ryan [name] Ryan [name]: Referring Physician: Treating Physician/Extender: Avriel, Bolivar, Risco [name] ([id number]) [id number] Page 6 of 8 Weeks in Treatment: 1 Education Assessment Education Provided To: Patient Education Topics Provided Venous: Methods: Explain/Verbal Responses: State content correctly Wound/Skin Impairment: Methods: Explain/Verbal Responses: State content correctly Electronic Signature(s) Signed: 11/24/2023 4:57:37 PM By: Ryan Pulling RN, BSN Entered By: Ryan [name] on 11/24/2023 09:28:30 -------------------------------------------------------------------------------- Wound Assessment Details Patient Name: Date of Service: Ryan [name], Ryan [name] [name] 11/24/2023 8:45 A M Medical Record Number: [id number] Patient Account Number: [id number] Date of Birth/Sex:  Treating RN: [birth year] (62 y.o. Cline Cools Primary Care Daylen Hack: Ryan [name] Ryan [name]: Referring Tanaysha Alkins: Treating Encarnacion Scioneaux/Extender: Ryan [name] in Treatment: 1 Wound Status Wound Number: 2 Primary Venous Leg Ulcer Etiology: Wound Location: Right, Medial Foot Wound Status: Open Wounding Event: Gradually Appeared Comorbid Cataracts, Angina, Arrhythmia, Hypertension, Type II Date Acquired: 09/19/2023 History: Diabetes, Osteoarthritis Weeks Of Treatment: 1 Clustered Wound: No Photos Wound Measurements Length: (cm) 1 Width: (cm) 0.9 Depth: (cm) 0.2 Area: (cm) 0.707 Volume: (cm) 0.141 % Reduction in Area: 16.6% % Reduction in Volume: -65.9% Epithelialization: Small (1-33%) Tunneling: No Undermining: No Wound Description Classification: Full Thickness Without Exposed Support Structures Wound Margin: Distinct, outline attached Exudate Amount: Medium Exudate Type: Serous Billingham, Ryan [name] ([id number]) Exudate Color: amber Foul Odor After Cleansing: No Slough/Fibrino Yes [id number].pdf Page 7 of 8 Wound Bed Granulation Amount: Medium (34-66%) Exposed Structure Granulation Quality: Red, Pink Fascia Exposed: No Necrotic Amount: Medium (34-66%) Fat Layer (Subcutaneous Tissue) Exposed: Yes Necrotic Quality: Eschar Tendon Exposed: No Muscle Exposed: No Joint Exposed: No Bone Exposed: No Periwound Skin Texture Texture Color No Abnormalities Noted: Yes No Abnormalities Noted: No Hemosiderin Staining: Yes Moisture No Abnormalities Noted: Yes Temperature / Pain Temperature: No Abnormality Tenderness on Palpation: Yes Treatment Notes Wound #2 (Foot) Wound Laterality: Right, Medial Cleanser Peri-Wound Care Triamcinolone 15 (g) Discharge Instruction: Use triamcinolone 15 (g) as directed Sween Lotion (Moisturizing lotion) Discharge Instruction: Apply moisturizing lotion as directed Topical Primary  Dressing Hydrofera Blue Ready Transfer Foam, 2.5x2.5 (in/in) Discharge Instruction: Apply directly to wound bed as directed Secondary Dressing Woven Gauze Sponge, Non-Sterile 4x4 in Discharge Instruction: Apply over primary dressing as directed. Secured With Compression Wrap Urgo K2 Lite, (equivalent to a 3 layer) two layer compression system, regular Discharge Instruction: Apply Urgo K2 Lite as directed (alternative to 3 layer compression). Compression  Stockings Facilities manager) Signed: 11/24/2023 4:57:37 PM By: Ryan Pulling RN, BSN Entered By: Ryan [name] on 11/24/2023 09:35:39 -------------------------------------------------------------------------------- Vitals Details Patient Name: Date of Service: Ryan [name] [name] 11/24/2023 8:45 A M Medical Record Number: [id number] Patient Account Number: [id number] Date of Birth/Sex: Treating RN: 08/07/1961 (62 y.o. Ryan [name] Primary Care Rogue Rafalski: Ryan [name] Ryan [name]: Referring Maveryk Renstrom: Treating Apostolos Blagg/Extender: Ryan [name] in Treatment: 1 Vital Signs Time Taken: 09:08 Temperature (F): 97.8 Ryan [name], Ryan [name] ([id number]) [phone].pdf Page 8 of 8 Height (in): 69 Pulse (bpm): 86 Weight (lbs): 236 Respiratory Rate (breaths/min): 18 Body Mass Index (BMI): 34.8 Blood Pressure (mmHg): 147/85 Capillary Blood Glucose (mg/dl): 315 Reference Range: 80 - 120 mg / dl Electronic Signature(s) Signed: 11/24/2023 5:43:57 PM By: Karie Schwalbe RN Entered By: Karie Schwalbe on 11/24/2023 09:10:20

## 2023-11-25 NOTE — Progress Notes (Signed)
Ryan [name], Ryan [name] ([id number]) [id number] Page 1 of 6 Visit Report for 11/24/2023 HPI Details Patient Name: Date of Service: Ryan [name], Ryan [name] [facility] [name] 11/24/2023 8:45 A M Medical Record Number: [id number] Patient Account Number: [id number] Date of Birth/Sex: Treating RN: 07-17-1961 (62 y.o. M) Primary Care Provider: [name] Other Clinician: Referring Provider: Treating Provider/Extender: Nanine Means in Treatment: 1 History of Present Illness HPI Description: ADMISSION 10/01/2022 This is a 62 year old poorly controlled type II diabetic (last hemoglobin A1c 10%) with hypertension and history of substance abuse who presents to clinic today with a wound on his right medial lower leg. He says that he is not entirely sure how it started, but it has been present for about a month. He says that the wound and the area surrounding it are exquisitely tender. His primary care provider prescribed a steroid cream which she has been using, but he says it has not helped. ABI in clinic today was noncompressible, but he has easily palpable pulses. On the medial aspect of his right lower leg, there is a small wound exposing the fat layer. There is slough and eschar on the surface. There is no odor or purulent drainage. 09/23/2022: The PCR culture that I took last week grew out Escherichia coli. I prescribed a course of oral Augmentin and the patient is taking this currently. The wound is a little bit smaller and less tender. There is still slough and eschar buildup. 10/20; the patient's wound is closed today. He has chronic venous insufficiency. He did not come in with any compression stockings READMISSION 11/14/2023 This is a patient we had in the clinic in 2023 for 3 visits. At that time he had a wound on the right leg felt to be secondary to chronic venous insufficiency. He also has type 2 diabetes which is generally been poorly controlled. He was  discharged with compression stockings. He comes in this time with a painful wound on the right medial foot which he said is has been there for about 2 to 3 months. Not really clear what he has been using on the wound but there is been no progress. He finds this very uncomfortable. Past medical history includes an Achilles tendon rupture that was repaired about 4 years ago, he has A-fib and flutter and was supposed to start Eliquis but he only recently got this through Medicaid. He has type 2 diabetes on insulin rate most recent hemoglobin A1c of 9.5 ABI in our clinic was 1.02 on the right 12/12; this is a patient who is a poorly controlled type II diabetic. He is also felt to have chronic venous insufficiency. He has an exceptionally painful wound on the right medial ankle just below the medial malleolus. When I saw him for the first time last week I thought this was almost all venous. His ABI in our clinic was quite normal at 1.02 and he has palpable dorsalis pedis and posterior tibial pulses. We used Hydrofera Blue under Urgo K2 compression He comes in today with the wound looking smaller to me although this would not be an easy wound to do accurate measurements. He is still complaining of almost continuous shooting pain including some that goes above the wound and up his leg. Electronic Signature(s) Signed: 11/24/2023 5:17:16 PM By: Baltazar Najjar MD Entered By: Baltazar Najjar on 11/24/2023 09:38:38 -------------------------------------------------------------------------------- Physical Exam Details Patient Name: Date of Service: Ryan [name], Ryan [name] 11/24/2023 8:45 A M Medical Record Number: [id number] Patient Account Number: [id number] Date  of Birth/Sex: Treating RN: 1961/02/05 (62 y.o. M) Primary Care Provider: [name] Other Clinician: Referring Provider: Treating Provider/Extender: Nanine Means in Treatment: 1 Constitutional Ryan [name], Ryan [name]  ([id number]) [id number] Page 2 of 6 Sitting or standing Blood Pressure is within target range for patient.. Pulse regular and within target range for patient.Marland Kitchen Respirations regular, non-labored and within target range.. Temperature is normal and within the target range for the patient.Marland Kitchen Appears in no distress. Notes Wound exam; small area just below the right medial malleolus. This is almost in a sideways S shape. Some debris on the surface but nothing that requires mechanical debridement. I wipe some of this off with a Q-tip. Very tender around the wound but not overtly infected. His dorsalis pedis and posterior tibial pulses are palpable. Skin discoloration in the medial ankle area looks like chronic venous insufficiency it is not warm Electronic Signature(s) Signed: 11/24/2023 5:17:16 PM By: Baltazar Najjar MD Entered By: Baltazar Najjar on 11/24/2023 09:40:24 -------------------------------------------------------------------------------- Physician Orders Details Patient Name: Date of Service: Ryan [name], Ryan [name] [facility] [place] [name] 11/24/2023 8:45 A M Medical Record Number: [id number] Patient Account Number: [id number] Date of Birth/Sex: Treating RN: 01/19/1961 (62 y.o. Cline Cools Primary Care Provider: [name] Other Clinician: Referring Provider: Treating Provider/Extender: Nanine Means in Treatment: 1 Verbal / Phone Orders: No Diagnosis Coding Follow-up Appointments ppointment in 1 week. - Dr. Leanord Hawking Return A Anesthetic Wound #2 Right,Medial Foot (In clinic) Topical Lidocaine 4% applied to wound bed Bathing/ Shower/ Hygiene May shower with protection but do not get wound dressing(s) wet. Protect dressing(s) with water repellant cover (for example, large plastic bag) or a cast cover and may then take shower. - may purchase cast cover at [facility], [facility] or on-line Edema Control - Orders / Instructions Right Lower Extremity Elevate  legs to the level of the heart or above for 30 minutes daily and/or when sitting for 3-4 times a day throughout the day. Avoid standing for long periods of time. Exercise regularly Wound Treatment Wound #2 - Foot Wound Laterality: Right, Medial Peri-Wound Care: Triamcinolone 15 (g) 1 x Per Week/30 Days Discharge Instructions: Use triamcinolone 15 (g) as directed Peri-Wound Care: Sween Lotion (Moisturizing lotion) 1 x Per Week/30 Days Discharge Instructions: Apply moisturizing lotion as directed Prim Dressing: Hydrofera Blue Ready Transfer Foam, 2.5x2.5 (in/in) 1 x Per Week/30 Days ary Discharge Instructions: Apply directly to wound bed as directed Secondary Dressing: Woven Gauze Sponge, Non-Sterile 4x4 in 1 x Per Week/30 Days Discharge Instructions: Apply over primary dressing as directed. Compression Wrap: Urgo K2 Lite, (equivalent to a 3 layer) two layer compression system, regular 1 x Per Week/30 Days Discharge Instructions: Apply Urgo K2 Lite as directed (alternative to 3 layer compression). Consults Vascular - Formal arterial studies, arterial doppler Patient Medications llergies: No Known Allergies A Notifications Medication Indication [address] GIR[place], DOBIS [name] ([id number]) [id number] Page 3 of 6 11/24/2023 lidocaine DOSE topical 5 % ointment - ointment topical once daily Electronic Signature(s) Signed: 11/24/2023 4:57:37 PM By: Redmond Pulling RN, BSN Signed: 11/24/2023 5:17:16 PM By: Baltazar Najjar MD Entered By: Redmond Pulling on 11/24/2023 09:31:35 Prescription 11/24/2023 -------------------------------------------------------------------------------- April Holding MD Patient Name: Provider: 1961-11-27 [id number] Date of Birth: NPI#Judie Petit [id number] Sex: DEA #: [phone] [id number] Phone #: License #: [id number] UPN: Patient Address: [address] [facility] [place], [place] [address]  [address] [place], [place] [address] [phone] Allergies No Known  Allergies Provider's Orders Vascular - Formal arterial studies, arterial doppler Hand Signature: Date(s): Electronic Signature(s) Signed: 11/24/2023 4:57:37 PM By: Redmond Pulling RN, BSN Signed: 11/24/2023 5:17:16 PM By: Baltazar Najjar MD Entered By: Redmond Pulling on 11/24/2023 09:31:36 -------------------------------------------------------------------------------- Problem List Details Patient Name: Date of Service: Ryan [name], Ryan [name] [facility] [place] [name] 11/24/2023 8:45 A M Medical Record Number: [id number] Patient Account Number: [id number] Date of Birth/Sex: Treating RN: 10-16-1961 (62 y.o. M) Primary Care Provider: [name] Other Clinician: Referring Provider: Treating Provider/Extender: Nanine Means in Treatment: 1 Active Problems ICD-10 Encounter Code Description Active Date MDM Diagnosis E11.621 Type 2 diabetes mellitus with foot ulcer 11/14/2023 No Yes L97.518 Non-pressure chronic ulcer of other part of right foot with other specified 11/14/2023 No Yes Ryan [name], Ryan [name] ([id number]) [id number] Page 4 of 6 severity I87.331 Chronic venous hypertension (idiopathic) with ulcer and inflammation of right 11/14/2023 No Yes lower extremity Inactive Problems Resolved Problems Electronic Signature(s) Signed: 11/24/2023 5:17:16 PM By: Baltazar Najjar MD Entered By: Baltazar Najjar on 11/24/2023 09:37:01 -------------------------------------------------------------------------------- Progress Note Details Patient Name: Date of Service: Ryan [name] [place] [name] 11/24/2023 8:45 A M Medical Record Number: [id number] Patient Account Number: [id number] Date of Birth/Sex: Treating RN: [birth year] (62 y.o. M) Primary Care Provider: [name] Other Clinician: Referring Provider: Treating Provider/Extender: Nanine Means in  Treatment: 1 Subjective History of Present Illness (HPI) ADMISSION 10/01/2022 This is a 62 year old poorly controlled type II diabetic (last hemoglobin A1c 10%) with hypertension and history of substance abuse who presents to clinic today with a wound on his right medial lower leg. He says that he is not entirely sure how it started, but it has been present for about a month. He says that the wound and the area surrounding it are exquisitely tender. His primary care provider prescribed a steroid cream which she has been using, but he says it has not helped. ABI in clinic today was noncompressible, but he has easily palpable pulses. On the medial aspect of his right lower leg, there is a small wound exposing the fat layer. There is slough and eschar on the surface. There is no odor or purulent drainage. 09/23/2022: The PCR culture that I took last week grew out Escherichia coli. I prescribed a course of oral Augmentin and the patient is taking this currently. The wound is a little bit smaller and less tender. There is still slough and eschar buildup. 10/20; the patient's wound is closed today. He has chronic venous insufficiency. He did not come in with any compression stockings READMISSION 11/14/2023 This is a patient we had in the clinic in 2023 for 3 visits. At that time he had a wound on the right leg felt to be secondary to chronic venous insufficiency. He also has type 2 diabetes which is generally been poorly controlled. He was discharged with compression stockings. He comes in this time with a painful wound on the right medial foot which he said is has been there for about 2 to 3 months. Not really clear what he has been using on the wound but there is been no progress. He finds this very uncomfortable. Past medical history includes an Achilles tendon rupture that was repaired about 4 years ago, he has A-fib and flutter and was supposed to start Eliquis but he only recently got this  through Medicaid. He has type 2 diabetes on insulin rate most recent hemoglobin A1c of 9.5 ABI in our clinic was 1.02 on the right 12/12; this is  a patient who is a poorly controlled type II diabetic. He is also felt to have chronic venous insufficiency. He has an exceptionally painful wound on the right medial ankle just below the medial malleolus. When I saw him for the first time last week I thought this was almost all venous. His ABI in our clinic was quite normal at 1.02 and he has palpable dorsalis pedis and posterior tibial pulses. We used Hydrofera Blue under Urgo K2 compression He comes in today with the wound looking smaller to me although this would not be an easy wound to do accurate measurements. He is still complaining of almost continuous shooting pain including some that goes above the wound and up his leg. Ryan [name], Ryan [name] ([id number]) [id number] Page 5 of 6 Objective Constitutional Sitting or standing Blood Pressure is within target range for patient.. Pulse regular and within target range for patient.Marland Kitchen Respirations regular, non-labored and within target range.. Temperature is normal and within the target range for the patient.Marland Kitchen Appears in no distress. Vitals Time Taken: 9:08 AM, Height: 69 in, Weight: 236 lbs, BMI: 34.8, Temperature: 97.8 F, Pulse: 86 bpm, Respiratory Rate: 18 breaths/min, Blood Pressure: 147/85 mmHg, Capillary Blood Glucose: 200 mg/dl. General Notes: Wound exam; small area just below the right medial malleolus. This is almost in a sideways S shape. Some debris on the surface but nothing that requires mechanical debridement. I wipe some of this off with a Q-tip. Very tender around the wound but not overtly infected. His dorsalis pedis and posterior tibial pulses are palpable. Skin discoloration in the medial ankle area looks like chronic venous insufficiency it is not warm Integumentary (Hair, Skin) Wound #2 status is Open. Original  cause of wound was Gradually Appeared. The date acquired was: 09/19/2023. The wound has been in treatment 1 weeks. The wound is located on the Right,Medial Foot. The wound measures 1cm length x 0.9cm width x 0.2cm depth; 0.707cm^2 area and 0.141cm^3 volume. There is Fat Layer (Subcutaneous Tissue) exposed. There is no tunneling or undermining noted. There is a medium amount of serous drainage noted. The wound margin is distinct with the outline attached to the wound base. There is medium (34-66%) red, pink granulation within the wound bed. There is a medium (34-66%) amount of necrotic tissue within the wound bed including Eschar. The periwound skin appearance had no abnormalities noted for texture. The periwound skin appearance had no abnormalities noted for moisture. The periwound skin appearance exhibited: Hemosiderin Staining. Periwound temperature was noted as No Abnormality. The periwound has tenderness on palpation. Assessment Active Problems ICD-10 Type 2 diabetes mellitus with foot ulcer Non-pressure chronic ulcer of other part of right foot with other specified severity Chronic venous hypertension (idiopathic) with ulcer and inflammation of right lower extremity Procedures Wound #2 Pre-procedure diagnosis of Wound #2 is a Venous Leg Ulcer located on the Right,Medial Foot . There was a Three Layer Compression Therapy Procedure by Redmond Pulling, RN. Post procedure Diagnosis Wound #2: Same as Pre-Procedure Plan Follow-up Appointments: Return Appointment in 1 week. - Dr. Leanord Hawking Anesthetic: Wound #2 Right,Medial Foot: (In clinic) Topical Lidocaine 4% applied to wound bed Bathing/ Shower/ Hygiene: May shower with protection but do not get wound dressing(s) wet. Protect dressing(s) with water repellant cover (for example, large plastic bag) or a cast cover and may then take shower. - may purchase cast cover at [facility], [facility] or on-line Edema Control - Orders / Instructions: Elevate legs  to the level of the heart or above for 30 minutes  daily and/or when sitting for 3-4 times a day throughout the day. Avoid standing for long periods of time. Exercise regularly Consults ordered were: Vascular - Formal arterial studies, arterial doppler The following medication(s) was prescribed: lidocaine topical 5 % ointment ointment topical once daily was prescribed at facility WOUND #2: - Foot Wound Laterality: Right, Medial Peri-Wound Care: Triamcinolone 15 (g) 1 x Per Week/30 Days Discharge Instructions: Use triamcinolone 15 (g) as directed Peri-Wound Care: Sween Lotion (Moisturizing lotion) 1 x Per Week/30 Days Discharge Instructions: Apply moisturizing lotion as directed Prim Dressing: Hydrofera Blue Ready Transfer Foam, 2.5x2.5 (in/in) 1 x Per Week/30 Days ary Discharge Instructions: Apply directly to wound bed as directed Secondary Dressing: Woven Gauze Sponge, Non-Sterile 4x4 in 1 x Per Week/30 Days Discharge Instructions: Apply over primary dressing as directed. Com pression Wrap: Urgo K2 Lite, (equivalent to a 3 layer) two layer compression system, regular 1 x Per Week/30 Days Discharge Instructions: Apply Urgo K2 Lite as directed (alternative to 3 layer compression). 1. What looked to be a chronic venous insufficiency ulcer in the setting of a previous visit to this clinic with the same. We used [facility] under [employer] [name] ([id number]) [id number] Page 6 of 6 compression he is complaining of a lot of pain. 2. Although I did not think he had arterial issues last week I am going to check this with formal arterial studies. Also venous reflux studies. I did not feel there was any infection. His edema control is good 3 continue with Hydrofera Blue as the primary dressing TCA. I have reduced his compression to Urgo K2 lite Electronic Signature(s) Signed: 11/24/2023 5:17:16 PM By: Baltazar Najjar MD Entered By: Baltazar Najjar on  11/24/2023 09:41:27 -------------------------------------------------------------------------------- SuperBill Details Patient Name: Date of Service: Ryan [name] [place] [name] 11/24/2023 Medical Record Number: [id number] Patient Account Number: [id number] Date of Birth/Sex: Treating RN: 1961-06-10 (62 y.o. M) Primary Care Provider: [name] Other Clinician: Referring Provider: Treating Provider/Extender: Nanine Means in Treatment: 1 Diagnosis Coding ICD-10 Codes Code Description [phone] Type 2 diabetes mellitus with foot ulcer L97.518 Non-pressure chronic ulcer of other part of right foot with other specified severity I87.331 Chronic venous hypertension (idiopathic) with ulcer and inflammation of right lower extremity Facility Procedures : CPT4 Code: [id number] Description: (Facility Use Only) [phone] - APPLY MULTLAY COMPRS LWR RT LEG Modifier: Quantity: 1 Physician Procedures : CPT4 Code Description Modifier [id number] - WC PHYS LEVEL 3 - EST PT ICD-10 Diagnosis Description E11.621 Type 2 diabetes mellitus with foot ulcer L97.518 Non-pressure chronic ulcer of other part of right foot with other specified severity I87.331  Chronic venous hypertension (idiopathic) with ulcer and inflammation of right lower extremity Quantity: 1 Electronic Signature(s) Signed: 11/24/2023 4:57:37 PM By: Redmond Pulling RN, BSN Signed: 11/24/2023 5:17:16 PM By: Baltazar Najjar MD Entered By: Redmond Pulling on 11/24/2023 11:02:09

## 2023-11-28 ENCOUNTER — Ambulatory Visit (HOSPITAL_COMMUNITY): Payer: 59

## 2023-12-01 ENCOUNTER — Encounter (HOSPITAL_BASED_OUTPATIENT_CLINIC_OR_DEPARTMENT_OTHER): Payer: 59 | Admitting: Internal Medicine

## 2023-12-01 DIAGNOSIS — I872 Venous insufficiency (chronic) (peripheral): Secondary | ICD-10-CM | POA: Diagnosis not present

## 2023-12-01 DIAGNOSIS — E1165 Type 2 diabetes mellitus with hyperglycemia: Secondary | ICD-10-CM | POA: Diagnosis not present

## 2023-12-01 DIAGNOSIS — N183 Chronic kidney disease, stage 3 unspecified: Secondary | ICD-10-CM | POA: Diagnosis not present

## 2023-12-01 DIAGNOSIS — E11621 Type 2 diabetes mellitus with foot ulcer: Secondary | ICD-10-CM | POA: Diagnosis not present

## 2023-12-01 DIAGNOSIS — Z794 Long term (current) use of insulin: Secondary | ICD-10-CM | POA: Diagnosis not present

## 2023-12-01 DIAGNOSIS — I87331 Chronic venous hypertension (idiopathic) with ulcer and inflammation of right lower extremity: Secondary | ICD-10-CM | POA: Diagnosis not present

## 2023-12-01 DIAGNOSIS — L97512 Non-pressure chronic ulcer of other part of right foot with fat layer exposed: Secondary | ICD-10-CM | POA: Diagnosis not present

## 2023-12-01 DIAGNOSIS — L97518 Non-pressure chronic ulcer of other part of right foot with other specified severity: Secondary | ICD-10-CM | POA: Diagnosis not present

## 2023-12-01 DIAGNOSIS — E1122 Type 2 diabetes mellitus with diabetic chronic kidney disease: Secondary | ICD-10-CM | POA: Diagnosis not present

## 2023-12-01 DIAGNOSIS — I4891 Unspecified atrial fibrillation: Secondary | ICD-10-CM | POA: Diagnosis not present

## 2023-12-01 DIAGNOSIS — I129 Hypertensive chronic kidney disease with stage 1 through stage 4 chronic kidney disease, or unspecified chronic kidney disease: Secondary | ICD-10-CM | POA: Diagnosis not present

## 2023-12-01 DIAGNOSIS — M1990 Unspecified osteoarthritis, unspecified site: Secondary | ICD-10-CM | POA: Diagnosis not present

## 2023-12-02 NOTE — Progress Notes (Signed)
Ryan [name] [name] ([id number]) [id number] Page 1 of 6 Visit Report for 12/01/2023 HPI Details Patient Name: Date of Service: Ryan [name], Ryan [name] [facility] [name] 12/01/2023 1:00 PM Medical Record Number: [id number] Patient Account Number: [id number] Date of Birth/Sex: Treating RN: 02/09/1961 (62 y.o. M) Primary Care Provider: [name] [name] Other Clinician: Referring Provider: Treating Provider/Extender: Ryan [name] in Treatment: 2 History of Present Illness HPI Description: ADMISSION 10/01/2022 This is a 62 year old poorly controlled type II diabetic (last hemoglobin A1c 10%) with hypertension and history of substance abuse who presents to clinic today with a wound on his right medial lower leg. He says that he is not entirely sure how it started, but it has been present for about a month. He says that the wound and the area surrounding it are exquisitely tender. His primary care provider prescribed a steroid cream which she has been using, but he says it has not helped. ABI in clinic today was noncompressible, but he has easily palpable pulses. On the medial aspect of his right lower leg, there is a small wound exposing the fat layer. There is slough and eschar on the surface. There is no odor or purulent drainage. 09/23/2022: The PCR culture that I took last week grew out Escherichia coli. I prescribed a course of oral Augmentin and the patient is taking this currently. The wound is a little bit smaller and less tender. There is still slough and eschar buildup. 10/20; the patient's wound is closed today. He has chronic venous insufficiency. He did not come in with any compression stockings READMISSION 11/14/2023 This is a patient we had in the clinic in 2023 for 3 visits. At that time he had a wound on the right leg felt to be secondary to chronic venous insufficiency. He also has type 2 diabetes which is generally been poorly controlled. He was  discharged with compression stockings. He comes in this time with a painful wound on the right medial foot which he said is has been there for about 2 to 3 months. Not really clear what he has been using on the wound but there is been no progress. He finds this very uncomfortable. Past medical history includes an Achilles tendon rupture that was repaired about 4 years ago, he has A-fib and flutter and was supposed to start Eliquis but he only recently got this through Medicaid. He has type 2 diabetes on insulin rate most recent hemoglobin A1c of 9.5 ABI in our clinic was 1.02 on the right 12/12; this is a patient who is a poorly controlled type II diabetic. He is also felt to have chronic venous insufficiency. He has an exceptionally painful wound on the right medial ankle just below the medial malleolus. When I saw him for the first time last week I thought this was almost all venous. His ABI in our clinic was quite normal at 1.02 and he has palpable dorsalis pedis and posterior tibial pulses. We used Hydrofera Blue under Urgo K2 compression He comes in today with the wound looking smaller to me although this would not be an easy wound to do accurate measurements. He is still complaining of almost continuous shooting pain including some that goes above the wound and up his leg. 12/19; the patient is being treated here for a wound on the right medial ankle just below the medial malleolus. My feeling here with that this was secondary to venous insufficiency. We have been using Hydrofera Blue under Urgo K2 compression. Last week he looks  smaller and that improvement in wound dimensions is continued to get better. He says the pain is better as well. He has his vascular studies next week including reflux studies and arterial studies including TBI's Electronic Signature(s) Signed: 12/01/2023 5:18:32 PM By: Ryan Najjar MD Entered By: Ryan [name] on 12/01/2023  13:14:12 -------------------------------------------------------------------------------- Physical Exam Details Patient Name: Date of Service: Ryan [name] Ryan [name] 12/01/2023 1:00 PM Medical Record Number: [id number] Patient Account Number: [id number] Date of Birth/Sex: Treating RN: [birth year] (62 y.o. M) Primary Care Provider: [name] [name] Other Clinician: Referring Provider: Treating Provider/Extender: Ryan [name], [place] [name] ([id number]) [phone].pdf Page 2 of 6 Weeks in Treatment: 2 Constitutional Sitting or standing Blood Pressure is within target range for patient.. Pulse regular and within target range for patient.Marland Kitchen Respirations regular, non-labored and within target range.. Temperature is normal and within the target range for the patient.Marland Kitchen Appears in no distress. Notes Wound exam; the area is just below the right medial malleolus. This is superficial 2 small open areas now. I think there has been some healing separating the 2 parts of this wound. He has surrounding what looks to be stasis dermatitis we have been putting TCA here. It is not warm or tender in this area. Electronic Signature(s) Signed: 12/01/2023 5:18:32 PM By: Ryan Najjar MD Entered By: Ryan [name] on 12/01/2023 13:16:48 -------------------------------------------------------------------------------- Physician Orders Details Patient Name: Date of Service: Ryan [name], Ryan [name] [facility] Ryan [name] 12/01/2023 1:00 PM Medical Record Number: [id number] Patient Account Number: [id number] Date of Birth/Sex: Treating RN: October 06, 1961 (62 y.o. Ryan [name] Primary Care Provider: [name] [name] Other Clinician: Referring Provider: Treating Provider/Extender: Ryan [name] in Treatment: 2 Verbal / Phone Orders: No Diagnosis Coding Follow-up Appointments ppointment in 1 week. - 12/08/23 @ 0745 will see Ryan [name] Return A ppointment in 2 weeks. - 12/15/22  @ 0915 Rm 3 Return A Anesthetic Wound #2 Right,Medial Foot (In clinic) Topical Lidocaine 4% applied to wound bed Bathing/ Shower/ Hygiene May shower with protection but do not get wound dressing(s) wet. Protect dressing(s) with water repellant cover (for example, large plastic bag) or a cast cover and may then take shower. - may purchase cast cover at [facility], [facility] or on-line Edema Control - Orders / Instructions Right Lower Extremity Elevate legs to the level of the heart or above for 30 minutes daily and/or when sitting for 3-4 times a day throughout the day. Avoid standing for long periods of time. Exercise regularly Wound Treatment Wound #2 - Foot Wound Laterality: Right, Medial Peri-Wound Care: Triamcinolone 15 (g) 1 x Per Week/30 Days Discharge Instructions: Use triamcinolone 15 (g) as directed Peri-Wound Care: Sween Lotion (Moisturizing lotion) 1 x Per Week/30 Days Discharge Instructions: Apply moisturizing lotion as directed Prim Dressing: Hydrofera Blue Ready Transfer Foam, 2.5x2.5 (in/in) 1 x Per Week/30 Days ary Discharge Instructions: Apply directly to wound bed as directed Secondary Dressing: Woven Gauze Sponge, Non-Sterile 4x4 in 1 x Per Week/30 Days Discharge Instructions: Apply over primary dressing as directed. Compression Wrap: Urgo K2 Lite, (equivalent to a 3 layer) two layer compression system, regular 1 x Per Week/30 Days Discharge Instructions: Apply Urgo K2 Lite as directed (alternative to 3 layer compression). Patient Medications llergies: No Known Allergies A Notifications Medication Indication [address] MELESIO, ETUE [name] ([id number]) [id number] Page 3 of 6 12/01/2023 lidocaine DOSE topical 5 % ointment - ointment topical once daily Electronic Signature(s) Signed: 12/01/2023 5:18:32 PM By: Ryan Najjar MD Signed: 12/01/2023 5:51:07 PM By: Redmond Pulling RN, BSN  Entered By: Redmond Pulling on 12/01/2023  13:13:37 -------------------------------------------------------------------------------- Problem List Details Patient Name: Date of Service: ALEXANDAR, STOWELL [facility] [name] 12/01/2023 1:00 PM Medical Record Number: [id number] Patient Account Number: [id number] Date of Birth/Sex: Treating RN: Jan 05, 1961 (62 y.o. M) Primary Care Provider: [name] [name] Other Clinician: Referring Provider: Treating Provider/Extender: Ryan [name] in Treatment: 2 Active Problems ICD-10 Encounter Code Description Active Date MDM Diagnosis E11.621 Type 2 diabetes mellitus with foot ulcer 11/14/2023 No Yes L97.518 Non-pressure chronic ulcer of other part of right foot with other specified 11/14/2023 No Yes severity I87.331 Chronic venous hypertension (idiopathic) with ulcer and inflammation of right 11/14/2023 No Yes lower extremity Inactive Problems Resolved Problems Electronic Signature(s) Signed: 12/01/2023 5:18:32 PM By: Ryan Najjar MD Entered By: Ryan [name] on 12/01/2023 13:11:16 -------------------------------------------------------------------------------- Progress Note Details Patient Name: Date of Service: Ryan [name] Ryan [name] 12/01/2023 1:00 PM Medical Record Number: [id number] Patient Account Number: [id number] Date of Birth/Sex: Treating RN: 1961/03/10 (62 y.o. M) Primary Care Provider: [name] [name] Other Clinician: Referring Provider: Treating Provider/Extender: Ryan [name] in Treatment: 2 Subjective Ryan [name], Ryan [name] ([id number]) [id number] Page 4 of 6 History of Present Illness (HPI) ADMISSION 10/01/2022 This is a 62 year old poorly controlled type II diabetic (last hemoglobin A1c 10%) with hypertension and history of substance abuse who presents to clinic today with a wound on his right medial lower leg. He says that he is not entirely sure how it started, but it has been present for about a month. He says  that the wound and the area surrounding it are exquisitely tender. His primary care provider prescribed a steroid cream which she has been using, but he says it has not helped. ABI in clinic today was noncompressible, but he has easily palpable pulses. On the medial aspect of his right lower leg, there is a small wound exposing the fat layer. There is slough and eschar on the surface. There is no odor or purulent drainage. 09/23/2022: The PCR culture that I took last week grew out Escherichia coli. I prescribed a course of oral Augmentin and the patient is taking this currently. The wound is a little bit smaller and less tender. There is still slough and eschar buildup. 10/20; the patient's wound is closed today. He has chronic venous insufficiency. He did not come in with any compression stockings READMISSION 11/14/2023 This is a patient we had in the clinic in 2023 for 3 visits. At that time he had a wound on the right leg felt to be secondary to chronic venous insufficiency. He also has type 2 diabetes which is generally been poorly controlled. He was discharged with compression stockings. He comes in this time with a painful wound on the right medial foot which he said is has been there for about 2 to 3 months. Not really clear what he has been using on the wound but there is been no progress. He finds this very uncomfortable. Past medical history includes an Achilles tendon rupture that was repaired about 4 years ago, he has A-fib and flutter and was supposed to start Eliquis but he only recently got this through Medicaid. He has type 2 diabetes on insulin rate most recent hemoglobin A1c of 9.5 ABI in our clinic was 1.02 on the right 12/12; this is a patient who is a poorly controlled type II diabetic. He is also felt to have chronic venous insufficiency. He has an exceptionally painful wound on the right medial ankle just below the medial  malleolus. When I saw him for the first time last week  I thought this was almost all venous. His ABI in our clinic was quite normal at 1.02 and he has palpable dorsalis pedis and posterior tibial pulses. We used Hydrofera Blue under Urgo K2 compression He comes in today with the wound looking smaller to me although this would not be an easy wound to do accurate measurements. He is still complaining of almost continuous shooting pain including some that goes above the wound and up his leg. 12/19; the patient is being treated here for a wound on the right medial ankle just below the medial malleolus. My feeling here with that this was secondary to venous insufficiency. We have been using Hydrofera Blue under Urgo K2 compression. Last week he looks smaller and that improvement in wound dimensions is continued to get better. He says the pain is better as well. He has his vascular studies next week including reflux studies and arterial studies including TBI's Objective Constitutional Sitting or standing Blood Pressure is within target range for patient.. Pulse regular and within target range for patient.Marland Kitchen Respirations regular, non-labored and within target range.. Temperature is normal and within the target range for the patient.Marland Kitchen Appears in no distress. Vitals Time Taken: 12:50 PM, Height: 69 in, Weight: 236 lbs, BMI: 34.8, Temperature: 97.9 F, Pulse: 147 bpm, Respiratory Rate: 18 breaths/min, Blood Pressure: 119/85 mmHg, Capillary Blood Glucose: 200 mg/dl. General Notes: Wound exam; the area is just below the right medial malleolus. This is superficial 2 small open areas now. I think there has been some healing separating the 2 parts of this wound. He has surrounding what looks to be stasis dermatitis we have been putting TCA here. It is not warm or tender in this area. Integumentary (Hair, Skin) Wound #2 status is Open. Original cause of wound was Gradually Appeared. The date acquired was: 09/19/2023. The wound has been in treatment 2 weeks. The wound is  located on the Right,Medial Foot. The wound measures 0.3cm length x 0.5cm width x 0.1cm depth; 0.118cm^2 area and 0.012cm^3 volume. There is Fat Layer (Subcutaneous Tissue) exposed. There is no tunneling or undermining noted. There is a medium amount of serous drainage noted. The wound margin is distinct with the outline attached to the wound base. There is medium (34-66%) red, pink granulation within the wound bed. There is a medium (34-66%) amount of necrotic tissue within the wound bed including Eschar. The periwound skin appearance had no abnormalities noted for texture. The periwound skin appearance had no abnormalities noted for moisture. The periwound skin appearance exhibited: Hemosiderin Staining. Periwound temperature was noted as No Abnormality. The periwound has tenderness on palpation. Assessment Active Problems ICD-10 Type 2 diabetes mellitus with foot ulcer Non-pressure chronic ulcer of other part of right foot with other specified severity Chronic venous hypertension (idiopathic) with ulcer and inflammation of right lower extremity Ryan [name], Ryan [name] ([id number]) [phone].pdf Page 5 of 6 Plan Follow-up Appointments: Return Appointment in 1 week. - 12/08/23 @ 0745 will see Ryan [name] Return Appointment in 2 weeks. - 12/15/22 @ 0915 Rm 3 Anesthetic: Wound #2 Right,Medial Foot: (In clinic) Topical Lidocaine 4% applied to wound bed Bathing/ Shower/ Hygiene: May shower with protection but do not get wound dressing(s) wet. Protect dressing(s) with water repellant cover (for example, large plastic bag) or a cast cover and may then take shower. - may purchase cast cover at [facility], [facility] or on-line Edema Control - Orders / Instructions: Elevate legs to the level  of the heart or above for 30 minutes daily and/or when sitting for 3-4 times a day throughout the day. Avoid standing for long periods of time. Exercise regularly The following medication(s) was  prescribed: lidocaine topical 5 % ointment ointment topical once daily was prescribed at facility WOUND #2: - Foot Wound Laterality: Right, Medial Peri-Wound Care: Triamcinolone 15 (g) 1 x Per Week/30 Days Discharge Instructions: Use triamcinolone 15 (g) as directed Peri-Wound Care: Sween Lotion (Moisturizing lotion) 1 x Per Week/30 Days Discharge Instructions: Apply moisturizing lotion as directed Prim Dressing: Hydrofera Blue Ready Transfer Foam, 2.5x2.5 (in/in) 1 x Per Week/30 Days ary Discharge Instructions: Apply directly to wound bed as directed Secondary Dressing: Woven Gauze Sponge, Non-Sterile 4x4 in 1 x Per Week/30 Days Discharge Instructions: Apply over primary dressing as directed. Com pression Wrap: Urgo K2 Lite, (equivalent to a 3 layer) two layer compression system, regular 1 x Per Week/30 Days Discharge Instructions: Apply Urgo K2 Lite as directed (alternative to 3 layer compression). 1. The wound looks quite a bit better. 2. I have been quite convinced that this was a venous insufficiency wound with surrounding stasis dermatitis however the pain seemed out of proportion to the wound. He comes in today with the pain better however. 3. He will have reflux studies and arterial studies next week. We will see him back in that timeframe. 4. Continue with Hydrofera Blue, TCA under compression Urgo K2 light Electronic Signature(s) Signed: 12/01/2023 5:18:32 PM By: Ryan Najjar MD Entered By: Ryan [name] on 12/01/2023 13:21:58 -------------------------------------------------------------------------------- SuperBill Details Patient Name: Date of Service: Ryan [name] Ryan [name] 12/01/2023 Medical Record Number: [id number] Patient Account Number: [id number] Date of Birth/Sex: Treating RN: [birth year] (62 y.o. Ryan [name] Primary Care Provider: [name] [name] Other Clinician: Referring Provider: Treating Provider/Extender: Ryan [name] in Treatment:  2 Diagnosis Coding ICD-10 Codes Code Description [id number] Type 2 diabetes mellitus with foot ulcer L97.518 Non-pressure chronic ulcer of other part of right foot with other specified severity I87.331 Chronic venous hypertension (idiopathic) with ulcer and inflammation of right lower extremity Facility Procedures : CPT4 Code: [id number] Description: (Facility Use Only) [phone] - APPLY MULTLAY COMPRS LWR RT LEG Modifier: Quantity: 1 Physician Procedures : CPT4 Code Description Modifier [id number] - WC PHYS LEVEL 3 - EST PT ICD-10 Diagnosis Description SIRRON, LOGA [name] ([id number]) [id number] E11.621 Type 2 diabetes mellitus with foot ulcer L97.518 Non-pressure chronic  ulcer of other part of right foot with other specified severity I87.331 Chronic venous hypertension (idiopathic) with ulcer and inflammation of right lower extremity Quantity: 1 f Page 6 of 6 Electronic Signature(s) Signed: 12/01/2023 5:18:32 PM By: Ryan Najjar MD Entered By: Ryan [name] on 12/01/2023 13:22:21

## 2023-12-02 NOTE — Progress Notes (Signed)
SILIS, GAETZ R ([id number]) [id number] Page 1 of 8 Visit Report for 12/01/2023 Arrival Information Details Patient Name: Date of Service: Ryan [name], Ryan [name] [facility] [place] R. 12/01/2023 1:00 PM Medical Record Number: [id number] Patient Account Number: [id number] Date of Birth/Sex: Treating RN: 1961/06/09 (62 y.o. Cline Cools Primary Care Makiah Foye: [name] Other Clinician: Referring Dyna Figuereo: Treating Jacinto Keil/Extender: Nanine Means in Treatment: 2 Visit Information History Since Last Visit Added or deleted any medications: No Patient Arrived: Ambulatory Any new allergies or adverse reactions: No Arrival Time: 12:47 Had a fall or experienced change in No Accompanied By: Self activities of daily living that may affect Transfer Assistance: None risk of falls: Patient Identification Verified: Yes Signs or symptoms of abuse/neglect since last visito No Secondary Verification Process Completed: Yes Hospitalized since last visit: No Patient Requires Transmission-Based Precautions: No Implantable device outside of the clinic excluding No Patient Has Alerts: Yes cellular tissue based products placed in the center Patient Alerts: Patient on Blood Thinner since last visit: Has Dressing in Place as Prescribed: Yes Has Compression in Place as Prescribed: Yes Pain Present Now: Yes Electronic Signature(s) Signed: 12/01/2023 5:51:07 PM By: Redmond Pulling RN, BSN Entered By: Redmond Pulling on 12/01/2023 12:58:53 -------------------------------------------------------------------------------- Compression Therapy Details Patient Name: Date of Service: Ryan [name] [place] R. 12/01/2023 1:00 PM Medical Record Number: [id number] Patient Account Number: [id number] Date of Birth/Sex: Treating RN: [birth year] (62 y.o. Cline Cools Primary Care Domnic Vantol: [name] Other Clinician: Referring Meshelle Holness: Treating Maurica Omura/Extender: Nanine Means in Treatment: 2 Compression Therapy Performed for Wound Assessment: Wound #2 Right,Medial Foot Performed By: Clinician Redmond Pulling, RN Compression Type: Three Layer Post Procedure Diagnosis Same as Pre-procedure Electronic Signature(s) Signed: 12/01/2023 5:51:07 PM By: Redmond Pulling RN, BSN Entered By: Redmond Pulling on 12/01/2023 17:14:38 [name] ([id number]) [id number] Page 2 of 8 -------------------------------------------------------------------------------- Encounter Discharge Information Details Patient Name: Date of Service: Ryan [name], Ryan [name] [facility] [place] R. 12/01/2023 1:00 PM Medical Record Number: [id number] Patient Account Number: [id number] Date of Birth/Sex: Treating RN: 1961/03/08 (62 y.o. Cline Cools Primary Care Shauni Henner: [name] Other Clinician: Referring Benancio Osmundson: Treating Roxene Alviar/Extender: Nanine Means in Treatment: 2 Encounter Discharge Information Items Discharge Condition: Stable Ambulatory Status: Ambulatory Discharge Destination: Home Transportation: Private Auto Accompanied By: self Schedule Follow-up Appointment: Yes Clinical Summary of Care: Patient Declined Electronic Signature(s) Signed: 12/01/2023 5:51:07 PM By: Redmond Pulling RN, BSN Entered By: Redmond Pulling on 12/01/2023 13:11:40 -------------------------------------------------------------------------------- Lower Extremity Assessment Details Patient Name: Date of Service: Ryan [name], Ryan [name] [place] R. 12/01/2023 1:00 PM Medical Record Number: [id number] Patient Account Number: [id number] Date of Birth/Sex: Treating RN: [birth year] (62 y.o. Cline Cools Primary Care Ramisa Duman: [name] Other Clinician: Referring Jaritza Duignan: Treating Cathalina Barcia/Extender: Nanine Means in Treatment: 2 Edema Assessment Assessed: Kyra Searles: No] [Right: No] Edema: [Left: Ye] [Right: s] Calf Left:  Right: Point of Measurement: From Medial Instep 37 cm Ankle Left: Right: Point of Measurement: From Medial Instep 23 cm Vascular Assessment Pulses: Dorsalis Pedis Palpable: [Right:Yes] Extremity colors, hair growth, and conditions: Extremity Color: [Right:Hyperpigmented] Hair Growth on Extremity: [Right:No] Temperature of Extremity: [Right:Warm] Capillary Refill: [Right:< 3 seconds] Dependent Rubor: [Right:No No] Electronic Signature(s) Signed: 12/01/2023 5:51:07 PM By: Redmond Pulling RN, BSN Entered By: Redmond Pulling on 12/01/2023 13:00:23 [name] ([id number]) [id number] Page 3 of 8 -------------------------------------------------------------------------------- Multi Wound Chart Details Patient Name: Date of Service: Ryan [name], Ryan [name] [facility] [place] R. 12/01/2023 1:00 PM Medical Record Number: [id number] Patient Account Number: [id number] Date  of Birth/Sex: Treating RN: 08/03/1961 (62 y.o. M) Primary Care Delfina Schreurs: [name] Other Clinician: Referring Kayde Warehime: Treating Jesslyn Viglione/Extender: Nanine Means in Treatment: 2 Vital Signs Height(in): 69 Capillary Blood Glucose(mg/dl): 161 Weight(lbs): 096 Pulse(bpm): 147 Body Mass Index(BMI): 34.8 Blood Pressure(mmHg): 119/85 Temperature(F): 97.9 Respiratory Rate(breaths/min): 18 [2:Photos:] [N/A:N/A] Right, Medial Foot N/A N/A Wound Location: Gradually Appeared N/A N/A Wounding Event: Venous Leg Ulcer N/A N/A Primary Etiology: Cataracts, Angina, Arrhythmia, N/A N/A Comorbid History: Hypertension, Type II Diabetes, Osteoarthritis 09/19/2023 N/A N/A Date Acquired: 2 N/A N/A Weeks of Treatment: Open N/A N/A Wound Status: No N/A N/A Wound Recurrence: 0.3x0.5x0.1 N/A N/A Measurements L x W x D (cm) 0.118 N/A N/A A (cm) : rea 0.012 N/A N/A Volume (cm) : 86.10% N/A N/A % Reduction in Area: 85.90% N/A N/A % Reduction in Volume: Full Thickness Without Exposed N/A  N/A Classification: Support Structures Medium N/A N/A Exudate A mount: Serous N/A N/A Exudate Type: amber N/A N/A Exudate Color: Distinct, outline attached N/A N/A Wound Margin: Medium (34-66%) N/A N/A Granulation Amount: Red, Pink N/A N/A Granulation Quality: Medium (34-66%) N/A N/A Necrotic Amount: Eschar N/A N/A Necrotic Tissue: Fat Layer (Subcutaneous Tissue): Yes N/A N/A Exposed Structures: Fascia: No Tendon: No Muscle: No Joint: No Bone: No Small (1-33%) N/A N/A Epithelialization: No Abnormalities Noted N/A N/A Periwound Skin Texture: No Abnormalities Noted N/A N/A Periwound Skin Moisture: Hemosiderin Staining: Yes N/A N/A Periwound Skin Color: No Abnormality N/A N/A Temperature: Yes N/A N/A Tenderness on Palpation: Treatment Notes Wound #2 (Foot) Wound Laterality: Right, Medial Cleanser Peri-Wound Care Triamcinolone 15 (g) GRAESYN, RUSZCZYK R ([id number]) [id number] Page 4 of 8 Discharge Instruction: Use triamcinolone 15 (g) as directed Sween Lotion (Moisturizing lotion) Discharge Instruction: Apply moisturizing lotion as directed Topical Primary Dressing Hydrofera Blue Ready Transfer Foam, 2.5x2.5 (in/in) Discharge Instruction: Apply directly to wound bed as directed Secondary Dressing Woven Gauze Sponge, Non-Sterile 4x4 in Discharge Instruction: Apply over primary dressing as directed. Secured With Compression Wrap Urgo K2 Lite, (equivalent to a 3 layer) two layer compression system, regular Discharge Instruction: Apply Urgo K2 Lite as directed (alternative to 3 layer compression). Compression Stockings Add-Ons Electronic Signature(s) Signed: 12/01/2023 5:18:32 PM By: Baltazar Najjar MD Entered By: Baltazar Najjar on 12/01/2023 13:12:33 -------------------------------------------------------------------------------- Multi-Disciplinary Care Plan Details Patient Name: Date of Service: FARDIN, MALSON [facility] [place] R. 12/01/2023  1:00 PM Medical Record Number: [id number] Patient Account Number: [id number] Date of Birth/Sex: Treating RN: Sep 17, 1961 (62 y.o. Cline Cools Primary Care Harriet Bollen: [name] Other Clinician: Referring Shantina Chronister: Treating Legaci Tarman/Extender: Nanine Means in Treatment: 2 Multidisciplinary Care Plan reviewed with physician Active Inactive Nutrition Nursing Diagnoses: Impaired glucose control: actual or potential Potential for alteratiion in Nutrition/Potential for imbalanced nutrition Goals: Patient/caregiver will maintain therapeutic glucose control Date Initiated: 11/14/2023 Target Resolution Date: 12/12/2023 Goal Status: Active Interventions: Assess HgA1c results as ordered upon admission and as needed Assess patient nutrition upon admission and as needed per policy Provide education on elevated blood sugars and impact on wound healing Treatment Activities: Patient referred to Primary Care Physician for further nutritional evaluation : 11/14/2023 Notes: Wound/Skin Impairment Nursing Diagnoses: SIMCHA, HUDELSON ([id number]) [phone].pdf Page 5 of 8 Impaired tissue integrity Knowledge deficit related to ulceration/compromised skin integrity Goals: Patient/caregiver will verbalize understanding of skin care regimen Date Initiated: 11/14/2023 Target Resolution Date: 12/12/2023 Goal Status: Active Ulcer/skin breakdown will have a volume reduction of 30% by week 4 Date Initiated: 11/14/2023 Target Resolution Date: 12/12/2023 Goal Status: Active Interventions: Assess patient/caregiver ability  to obtain necessary supplies Assess patient/caregiver ability to perform ulcer/skin care regimen upon admission and as needed Assess ulceration(s) every visit Provide education on ulcer and skin care Treatment Activities: Skin care regimen initiated : 11/14/2023 Topical wound management initiated : 11/14/2023 Notes: Electronic  Signature(s) Signed: 12/01/2023 5:51:07 PM By: Redmond Pulling RN, BSN Entered By: Redmond Pulling on 12/01/2023 13:07:55 -------------------------------------------------------------------------------- Pain Assessment Details Patient Name: Date of Service: Ryan [name] [place] R. 12/01/2023 1:00 PM Medical Record Number: [id number] Patient Account Number: [id number] Date of Birth/Sex: Treating RN: Nov 24, 1961 (62 y.o. Cline Cools Primary Care Sophiea Ueda: [name] Other Clinician: Referring Treyten Monestime: Treating Tyran Huser/Extender: Nanine Means in Treatment: 2 Active Problems Location of Pain Severity and Description of Pain Patient Has Paino Yes Site Locations Rate the pain. Current Pain Level: 6 Pain Management and Medication Current Pain Management: Electronic Signature(s) Signed: 12/01/2023 5:51:07 PM By: Redmond Pulling RN, BSN Entered By: Redmond Pulling on 12/01/2023 12:59:45 [name] ([id number]) [id number] Page 6 of 8 -------------------------------------------------------------------------------- Patient/Caregiver Education Details Patient Name: Date of Service: Ryan [name], Ryan [name] [facility] [place] 12/19/2024andnbsp1:00 PM Medical Record Number: [id number] Patient Account Number: [id number] Date of Birth/Gender: Treating RN: 1961/04/25 (62 y.o. Cline Cools Primary Care Physician: [name] Other Clinician: Referring Physician: Treating Physician/Extender: Nanine Means in Treatment: 2 Education Assessment Education Provided To: Patient Education Topics Provided Wound/Skin Impairment: Methods: Explain/Verbal Responses: State content correctly Electronic Signature(s) Signed: 12/01/2023 5:51:07 PM By: Redmond Pulling RN, BSN Entered By: Redmond Pulling on 12/01/2023 13:08:21 -------------------------------------------------------------------------------- Wound Assessment Details Patient  Name: Date of Service: Ryan [name], Ryan [name] [place] R. 12/01/2023 1:00 PM Medical Record Number: [id number] Patient Account Number: [id number] Date of Birth/Sex: Treating RN: 1961/09/25 (62 y.o. Cline Cools Primary Care Dicie Edelen: [name] Other Clinician: Referring Arye Weyenberg: Treating Shelonda Saxe/Extender: Nanine Means in Treatment: 2 Wound Status Wound Number: 2 Primary Venous Leg Ulcer Etiology: Wound Location: Right, Medial Foot Wound Status: Open Wounding Event: Gradually Appeared Comorbid Cataracts, Angina, Arrhythmia, Hypertension, Type II Date Acquired: 09/19/2023 History: Diabetes, Osteoarthritis Weeks Of Treatment: 2 Clustered Wound: No Photos Wound Measurements Cristina, Kemp R ([id number]) Length: (cm) 0.3 Width: (cm) 0.5 Depth: (cm) 0.1 Area: (cm) 0.118 Volume: (cm) 0.012 [id number] Page 7 of 8 % Reduction in Area: 86.1% % Reduction in Volume: 85.9% Epithelialization: Small (1-33%) Tunneling: No Undermining: No Wound Description Classification: Full Thickness Without Exposed Support Structures Wound Margin: Distinct, outline attached Exudate Amount: Medium Exudate Type: Serous Exudate Color: amber Foul Odor After Cleansing: No Slough/Fibrino Yes Wound Bed Granulation Amount: Medium (34-66%) Exposed Structure Granulation Quality: Red, Pink Fascia Exposed: No Necrotic Amount: Medium (34-66%) Fat Layer (Subcutaneous Tissue) Exposed: Yes Necrotic Quality: Eschar Tendon Exposed: No Muscle Exposed: No Joint Exposed: No Bone Exposed: No Periwound Skin Texture Texture Color No Abnormalities Noted: Yes No Abnormalities Noted: No Hemosiderin Staining: Yes Moisture No Abnormalities Noted: Yes Temperature / Pain Temperature: No Abnormality Tenderness on Palpation: Yes Treatment Notes Wound #2 (Foot) Wound Laterality: Right, Medial Cleanser Peri-Wound Care Triamcinolone 15 (g) Discharge Instruction: Use  triamcinolone 15 (g) as directed Sween Lotion (Moisturizing lotion) Discharge Instruction: Apply moisturizing lotion as directed Topical Primary Dressing Hydrofera Blue Ready Transfer Foam, 2.5x2.5 (in/in) Discharge Instruction: Apply directly to wound bed as directed Secondary Dressing Woven Gauze Sponge, Non-Sterile 4x4 in Discharge Instruction: Apply over primary dressing as directed. Secured With Compression Wrap Urgo K2 Lite, (equivalent to a 3 layer) two layer compression system, regular Discharge Instruction: Apply Urgo K2 Lite as  directed (alternative to 3 layer compression). Compression Stockings Add-Ons Electronic Signature(s) Signed: 12/01/2023 5:51:07 PM By: Redmond Pulling RN, BSN Entered By: Redmond Pulling on 12/01/2023 13:01:22 NOEH, DUFFEK R ([id number]) [id number] Page 8 of 8 -------------------------------------------------------------------------------- Vitals Details Patient Name: Date of Service: AMITOJ, BOLICH [facility] [place] R. 12/01/2023 1:00 PM Medical Record Number: [id number] Patient Account Number: [id number] Date of Birth/Sex: Treating RN: 13-Dec-1961 (62 y.o. Cline Cools Primary Care Hang Ammon: [name] Other Clinician: Referring Jacki Couse: Treating Ronalee Scheunemann/Extender: Nanine Means in Treatment: 2 Vital Signs Time Taken: 12:50 Temperature (F): 97.9 Height (in): 69 Pulse (bpm): 147 Weight (lbs): 236 Respiratory Rate (breaths/min): 18 Body Mass Index (BMI): 34.8 Blood Pressure (mmHg): 119/85 Capillary Blood Glucose (mg/dl): 725 Reference Range: 80 - 120 mg / dl Electronic Signature(s) Signed: 12/01/2023 5:51:07 PM By: Redmond Pulling RN, BSN Entered By: Redmond Pulling on 12/01/2023 12:59:35

## 2023-12-05 ENCOUNTER — Ambulatory Visit (INDEPENDENT_AMBULATORY_CARE_PROVIDER_SITE_OTHER)
Admission: RE | Admit: 2023-12-05 | Discharge: 2023-12-05 | Disposition: A | Discharge status: Home / Self Care | Payer: 59 | Source: Ambulatory Visit | Attending: Internal Medicine | Admitting: Internal Medicine

## 2023-12-05 ENCOUNTER — Other Ambulatory Visit (HOSPITAL_COMMUNITY): Payer: Self-pay | Admitting: Internal Medicine

## 2023-12-05 ENCOUNTER — Ambulatory Visit (HOSPITAL_COMMUNITY)
Admission: RE | Admit: 2023-12-05 | Discharge: 2023-12-05 | Disposition: A | Discharge status: Home / Self Care | Payer: 59 | Source: Ambulatory Visit | Attending: Internal Medicine | Admitting: Internal Medicine

## 2023-12-05 ENCOUNTER — Encounter (HOSPITAL_COMMUNITY): Payer: Self-pay

## 2023-12-05 DIAGNOSIS — S91302A Unspecified open wound, left foot, initial encounter: Secondary | ICD-10-CM

## 2023-12-05 DIAGNOSIS — S91301A Unspecified open wound, right foot, initial encounter: Secondary | ICD-10-CM

## 2023-12-05 LAB — VAS US ABI WITH/WO TBI
Left ABI: 1.22
Right ABI: 1.26

## 2023-12-08 ENCOUNTER — Encounter (HOSPITAL_BASED_OUTPATIENT_CLINIC_OR_DEPARTMENT_OTHER): Payer: 59 | Admitting: General Surgery

## 2023-12-08 DIAGNOSIS — Z794 Long term (current) use of insulin: Secondary | ICD-10-CM | POA: Diagnosis not present

## 2023-12-08 DIAGNOSIS — E1122 Type 2 diabetes mellitus with diabetic chronic kidney disease: Secondary | ICD-10-CM | POA: Diagnosis not present

## 2023-12-08 DIAGNOSIS — E1165 Type 2 diabetes mellitus with hyperglycemia: Secondary | ICD-10-CM | POA: Diagnosis not present

## 2023-12-08 DIAGNOSIS — I4891 Unspecified atrial fibrillation: Secondary | ICD-10-CM | POA: Diagnosis not present

## 2023-12-08 DIAGNOSIS — I872 Venous insufficiency (chronic) (peripheral): Secondary | ICD-10-CM | POA: Diagnosis not present

## 2023-12-08 DIAGNOSIS — I87331 Chronic venous hypertension (idiopathic) with ulcer and inflammation of right lower extremity: Secondary | ICD-10-CM | POA: Diagnosis not present

## 2023-12-08 DIAGNOSIS — N183 Chronic kidney disease, stage 3 unspecified: Secondary | ICD-10-CM | POA: Diagnosis not present

## 2023-12-08 DIAGNOSIS — I129 Hypertensive chronic kidney disease with stage 1 through stage 4 chronic kidney disease, or unspecified chronic kidney disease: Secondary | ICD-10-CM | POA: Diagnosis not present

## 2023-12-08 DIAGNOSIS — L97518 Non-pressure chronic ulcer of other part of right foot with other specified severity: Secondary | ICD-10-CM | POA: Diagnosis not present

## 2023-12-08 DIAGNOSIS — E11621 Type 2 diabetes mellitus with foot ulcer: Secondary | ICD-10-CM | POA: Diagnosis not present

## 2023-12-08 DIAGNOSIS — L97512 Non-pressure chronic ulcer of other part of right foot with fat layer exposed: Secondary | ICD-10-CM | POA: Diagnosis not present

## 2023-12-08 DIAGNOSIS — M1990 Unspecified osteoarthritis, unspecified site: Secondary | ICD-10-CM | POA: Diagnosis not present

## 2023-12-08 NOTE — Progress Notes (Signed)
TYWAYNE, SHALLENBERGER [name] ([id number]) [id number] Page 1 of 8 Visit Report for 12/08/2023 Chief Complaint Document Details Patient Name: Date of Service: Ryan [name], Ryan [name] [facility] [name] 12/08/2023 7:45 A M Medical Record Number: [id number] Patient Account Number: [id number] Date of Birth/Sex: Treating RN: 1961/09/18 (62 y.o. M) Primary Care Provider: [name] Other Clinician: Referring Provider: Treating Provider/Extender: Mariane Baumgarten in Treatment: 3 Information Obtained from: Patient Chief Complaint Patients presents for treatment of an open diabetic ulcer on his medial right lower leg 11/14/2023; patient returns to clinic with a wound on his right medial foot Electronic Signature(s) Signed: 12/08/2023 8:44:25 AM By: Duanne Guess MD FACS Entered By: Duanne Guess on 12/08/2023 05:11:14 -------------------------------------------------------------------------------- Debridement Details Patient Name: Date of Service: Ryan [place] [name] 12/08/2023 7:45 A M Medical Record Number: [id number] Patient Account Number: [id number] Date of Birth/Sex: Treating RN: November 06, 1961 (62 y.o. Cline Cools Primary Care Provider: [name] Other Clinician: Referring Provider: Treating Provider/Extender: Mariane Baumgarten in Treatment: 3 Debridement Performed for Assessment: Wound #2 Right,Medial Foot Performed By: Physician Duanne Guess, MD The following information was scribed by: Redmond Pulling The information was scribed for: Duanne Guess Debridement Type: Debridement Severity of Tissue Pre Debridement: Fat layer exposed Level of Consciousness (Pre-procedure): Awake and Alert Pre-procedure Verification/Time Out Yes - 08:00 Taken: Start Time: 08:00 Pain Control: Lidocaine 5% topical ointment Percent of Wound Bed Debrided: 100% T Area Debrided (cm): otal 0.03 Tissue and other material debrided: Non-Viable,  Eschar, Slough, Subcutaneous, Slough Level: Skin/Subcutaneous Tissue Debridement Description: Excisional Instrument: Curette Bleeding: Minimum Hemostasis Achieved: Pressure Response to Treatment: Procedure was tolerated well Level of Consciousness (Post- Awake and Alert procedure): Post Debridement Measurements of Total Wound Length: (cm) 0.2 Width: (cm) 0.2 Depth: (cm) 0.1 Volume: (cm) 0.003 Character of Wound/Ulcer Post Debridement: Improved Ryan [name] ([id number]) [phone].pdf Page 2 of 8 Severity of Tissue Post Debridement: Fat layer exposed Post Procedure Diagnosis Same as Pre-procedure Electronic Signature(s) Signed: 12/08/2023 8:44:25 AM By: Duanne Guess MD FACS Signed: 12/08/2023 3:45:54 PM By: Redmond Pulling RN, BSN Entered By: Redmond Pulling on 12/08/2023 05:29:38 -------------------------------------------------------------------------------- HPI Details Patient Name: Date of Service: Ryan [place] [name] 12/08/2023 7:45 A M Medical Record Number: [id number] Patient Account Number: [id number] Date of Birth/Sex: Treating RN: Dec 08, 1961 (62 y.o. M) Primary Care Provider: [name] Other Clinician: Referring Provider: Treating Provider/Extender: Mariane Baumgarten in Treatment: 3 History of Present Illness HPI Description: ADMISSION 10/01/2022 This is a 62 year old poorly controlled type II diabetic (last hemoglobin A1c 10%) with hypertension and history of substance abuse who presents to clinic today with a wound on his right medial lower leg. He says that he is not entirely sure how it started, but it has been present for about a month. He says that the wound and the area surrounding it are exquisitely tender. His primary care provider prescribed a steroid cream which she has been using, but he says it has not helped. ABI in clinic today was noncompressible, but he has easily palpable pulses. On the medial  aspect of his right lower leg, there is a small wound exposing the fat layer. There is slough and eschar on the surface. There is no odor or purulent drainage. 09/23/2022: The PCR culture that I took last week grew out Escherichia coli. I prescribed a course of oral Augmentin and the patient is taking this currently. The wound is a little bit smaller and less tender. There is still slough and eschar buildup.  10/20; the patient's wound is closed today. He has chronic venous insufficiency. He did not come in with any compression stockings READMISSION 11/14/2023 This is a patient we had in the clinic in 2023 for 3 visits. At that time he had a wound on the right leg felt to be secondary to chronic venous insufficiency. He also has type 2 diabetes which is generally been poorly controlled. He was discharged with compression stockings. He comes in this time with a painful wound on the right medial foot which he said is has been there for about 2 to 3 months. Not really clear what he has been using on the wound but there is been no progress. He finds this very uncomfortable. Past medical history includes an Achilles tendon rupture that was repaired about 4 years ago, he has A-fib and flutter and was supposed to start Eliquis but he only recently got this through Medicaid. He has type 2 diabetes on insulin rate most recent hemoglobin A1c of 9.5 ABI in our clinic was 1.02 on the right 12/12; this is a patient who is a poorly controlled type II diabetic. He is also felt to have chronic venous insufficiency. He has an exceptionally painful wound on the right medial ankle just below the medial malleolus. When I saw him for the first time last week I thought this was almost all venous. His ABI in our clinic was quite normal at 1.02 and he has palpable dorsalis pedis and posterior tibial pulses. We used Hydrofera Blue under Urgo K2 compression He comes in today with the wound looking smaller to me although this  would not be an easy wound to do accurate measurements. He is still complaining of almost continuous shooting pain including some that goes above the wound and up his leg. 12/19; the patient is being treated here for a wound on the right medial ankle just below the medial malleolus. My feeling here with that this was secondary to venous insufficiency. We have been using Hydrofera Blue under Urgo K2 compression. Last week he looks smaller and that improvement in wound dimensions is continued to get better. He says the pain is better as well. He has his vascular studies next week including reflux studies and arterial studies including TBI's 12/08/2023: His vascular studies were completed. Results copied here: ABI: +-------+-----------+-----------+------------+------------+ ABI/TBIT oday's ABIT oday's TBIPrevious ABIPrevious TBI +-------+-----------+-----------+------------+------------+ Right 1.26 0.77    +-------+-----------+-----------+------------+------------+ Left 1.22 0.87    +-------+-----------+-----------+------------+------------+ Venous Reflux: Right: - No evidence of deep vein thrombosis seen in the right lower extremity, JAYRIEL, TISBY [name] ([id number]) [phone].pdf Page 3 of 8 from the common femoral through the popliteal veins. - Venous reflux is noted in the right common femoral, femoral and popliteal veins. - Venous reflux is noted in the right sapheno-femoral junction and great saphenous vein (thigh and calf) The wound is only a couple of millimeters. There is some slough and eschar on the surface. He continues to complain of significant pain. He is not on any medication for neuropathic pain. Electronic Signature(s) Signed: 12/08/2023 8:44:25 AM By: Duanne Guess MD FACS Entered By: Duanne Guess on 12/08/2023 05:13:22 -------------------------------------------------------------------------------- Physical Exam  Details Patient Name: Date of Service: Ryan [name], Ryan [place] [name] 12/08/2023 7:45 A M Medical Record Number: [id number] Patient Account Number: [id number] Date of Birth/Sex: Treating RN: May 03, 1961 (62 y.o. M) Primary Care Provider: [name] Other Clinician: Referring Provider: Treating Provider/Extender: Mariane Baumgarten in Treatment: 3 Constitutional Slightly hypertensive. . . . no acute distress. Respiratory  Normal work of breathing on room air.. Notes The wound is only a couple of millimeters. There is some slough and eschar on the surface. Electronic Signature(s) Signed: 12/08/2023 8:44:25 AM By: Duanne Guess MD FACS Entered By: Duanne Guess on 12/08/2023 05:13:59 -------------------------------------------------------------------------------- Physician Orders Details Patient Name: Date of Service: Ryan [name], Ryan [place] [name] 12/08/2023 7:45 A M Medical Record Number: [id number] Patient Account Number: [id number] Date of Birth/Sex: Treating RN: 1961/03/01 (62 y.o. Cline Cools Primary Care Provider: [name] Other Clinician: Referring Provider: Treating Provider/Extender: Mariane Baumgarten in Treatment: 3 Verbal / Phone Orders: No Diagnosis Coding ICD-10 Coding Code Description L97.518 Non-pressure chronic ulcer of other part of right foot with other specified severity E11.621 Type 2 diabetes mellitus with foot ulcer I87.331 Chronic venous hypertension (idiopathic) with ulcer and inflammation of right lower extremity Follow-up Appointments ppointment in 1 week. - Dr Lady Gary 12/15/22 @ 0915 Rm 3 Return A Return A ppointment in 2 weeks. Other: - contact primary care Dr regarding nerve pain to possibly get prescription for gabapentin or pregabalin Call [facility] to get compression stockings Ryan [name], Ryan [name] ([id number]) [id number] Page 4 of 8 Anesthetic Wound #2 Right,Medial Foot (In  clinic) Topical Lidocaine 4% applied to wound bed Bathing/ Shower/ Hygiene May shower with protection but do not get wound dressing(s) wet. Protect dressing(s) with water repellant cover (for example, large plastic bag) or a cast cover and may then take shower. - may purchase cast cover at [facility], [facility] or on-line Edema Control - Orders / Instructions Right Lower Extremity Elevate legs to the level of the heart or above for 30 minutes daily and/or when sitting for 3-4 times a day throughout the day. Avoid standing for long periods of time. Exercise regularly Wound Treatment Wound #2 - Foot Wound Laterality: Right, Medial Peri-Wound Care: Triamcinolone 15 (g) 1 x Per Week/30 Days Discharge Instructions: Use triamcinolone 15 (g) as directed Peri-Wound Care: Sween Lotion (Moisturizing lotion) 1 x Per Week/30 Days Discharge Instructions: Apply moisturizing lotion as directed Prim Dressing: Hydrofera Blue Ready Transfer Foam, 2.5x2.5 (in/in) 1 x Per Week/30 Days ary Discharge Instructions: Apply directly to wound bed as directed Secondary Dressing: Woven Gauze Sponge, Non-Sterile 4x4 in 1 x Per Week/30 Days Discharge Instructions: Apply over primary dressing as directed. Compression Wrap: Urgo K2 Lite, (equivalent to a 3 layer) two layer compression system, regular 1 x Per Week/30 Days Discharge Instructions: Apply Urgo K2 Lite as directed (alternative to 3 layer compression). Patient Medications llergies: No Known Allergies A Notifications Medication Indication Start End 12/08/2023 lidocaine DOSE topical 5 % ointment - ointment topical once daily Electronic Signature(s) Signed: 12/08/2023 8:44:25 AM By: Duanne Guess MD FACS Entered By: Duanne Guess on 12/08/2023 05:14:21 -------------------------------------------------------------------------------- Problem List Details Patient Name: Date of Service: Ryan [place] [name] 12/08/2023 7:45 A M Medical Record Number:  [id number] Patient Account Number: [id number] Date of Birth/Sex: Treating RN: Sep 04, 1961 (62 y.o. M) Primary Care Provider: [name] Other Clinician: Referring Provider: Treating Provider/Extender: Mariane Baumgarten in Treatment: 3 Active Problems ICD-10 Encounter Code Description Active Date MDM Diagnosis L97.518 Non-pressure chronic ulcer of other part of right foot with other specified 11/14/2023 No Yes severity E11.621 Type 2 diabetes mellitus with foot ulcer 11/14/2023 No Yes MARIS, HASTON [name] ([id number]) [phone].pdf Page 5 of 8 I87.331 Chronic venous hypertension (idiopathic) with ulcer and inflammation of right 11/14/2023 No Yes lower extremity Inactive Problems Resolved Problems Electronic Signature(s) Signed: 12/08/2023 8:44:25 AM By: Duanne Guess MD  FACS Entered By: Duanne Guess on 12/08/2023 05:11:00 -------------------------------------------------------------------------------- Progress Note Details Patient Name: Date of Service: Ryan [name], Ryan [facility] [name] 12/08/2023 7:45 A M Medical Record Number: [id number] Patient Account Number: [id number] Date of Birth/Sex: Treating RN: 05-11-1961 (62 y.o. M) Primary Care Provider: [name] Other Clinician: Referring Provider: Treating Provider/Extender: Mariane Baumgarten in Treatment: 3 Subjective Chief Complaint Information obtained from Patient Patients presents for treatment of an open diabetic ulcer on his medial right lower leg 11/14/2023; patient returns to clinic with a wound on his right medial foot History of Present Illness (HPI) ADMISSION 10/01/2022 This is a 62 year old poorly controlled type II diabetic (last hemoglobin A1c 10%) with hypertension and history of substance abuse who presents to clinic today with a wound on his right medial lower leg. He says that he is not entirely sure how it started, but it has been present for about a  month. He says that the wound and the area surrounding it are exquisitely tender. His primary care provider prescribed a steroid cream which she has been using, but he says it has not helped. ABI in clinic today was noncompressible, but he has easily palpable pulses. On the medial aspect of his right lower leg, there is a small wound exposing the fat layer. There is slough and eschar on the surface. There is no odor or purulent drainage. 09/23/2022: The PCR culture that I took last week grew out Escherichia coli. I prescribed a course of oral Augmentin and the patient is taking this currently. The wound is a little bit smaller and less tender. There is still slough and eschar buildup. 10/20; the patient's wound is closed today. He has chronic venous insufficiency. He did not come in with any compression stockings READMISSION 11/14/2023 This is a patient we had in the clinic in 2023 for 3 visits. At that time he had a wound on the right leg felt to be secondary to chronic venous insufficiency. He also has type 2 diabetes which is generally been poorly controlled. He was discharged with compression stockings. He comes in this time with a painful wound on the right medial foot which he said is has been there for about 2 to 3 months. Not really clear what he has been using on the wound but there is been no progress. He finds this very uncomfortable. Past medical history includes an Achilles tendon rupture that was repaired about 4 years ago, he has A-fib and flutter and was supposed to start Eliquis but he only recently got this through Medicaid. He has type 2 diabetes on insulin rate most recent hemoglobin A1c of 9.5 ABI in our clinic was 1.02 on the right 12/12; this is a patient who is a poorly controlled type II diabetic. He is also felt to have chronic venous insufficiency. He has an exceptionally painful wound on the right medial ankle just below the medial malleolus. When I saw him for the first  time last week I thought this was almost all venous. His ABI in our clinic was quite normal at 1.02 and he has palpable dorsalis pedis and posterior tibial pulses. We used Hydrofera Blue under Urgo K2 compression He comes in today with the wound looking smaller to me although this would not be an easy wound to do accurate measurements. He is still complaining of almost continuous shooting pain including some that goes above the wound and up his leg. 12/19; the patient is being treated here for a wound on the  right medial ankle just below the medial malleolus. My feeling here with that this was secondary to venous insufficiency. We have been using Hydrofera Blue under Urgo K2 compression. Last week he looks smaller and that improvement in wound dimensions is continued to get better. He says the pain is better as well. He has his vascular studies next week including reflux studies and arterial studies including TBI's 12/08/2023: His vascular studies were completed. Results copied here: BREYLEN, ANTILLA ([id number]) [id number] Page 6 of 8 ABI: +-------+-----------+-----------+------------+------------+ ABI/TBIT oday's ABIT oday's TBIPrevious ABIPrevious TBI +-------+-----------+-----------+------------+------------+ Right 1.26 0.77    +-------+-----------+-----------+------------+------------+ Left 1.22 0.87    +-------+-----------+-----------+------------+------------+ Venous Reflux: Right: - No evidence of deep vein thrombosis seen in the right lower extremity, from the common femoral through the popliteal veins. - Venous reflux is noted in the right common femoral, femoral and popliteal veins. - Venous reflux is noted in the right sapheno-femoral junction and great saphenous vein (thigh and calf) The wound is only a couple of millimeters. There is some slough and eschar on the surface. He continues to complain of significant pain. He is not  on any medication for neuropathic pain. Objective Constitutional Slightly hypertensive. no acute distress. Vitals Time Taken: 7:51 AM, Height: 69 in, Weight: 236 lbs, BMI: 34.8, Temperature: 97.5 F, Pulse: 80 bpm, Respiratory Rate: 18 breaths/min, Blood Pressure: 148/88 mmHg. Respiratory Normal work of breathing on room air.. General Notes: The wound is only a couple of millimeters. There is some slough and eschar on the surface. Integumentary (Hair, Skin) Wound #2 status is Open. Original cause of wound was Gradually Appeared. The date acquired was: 09/19/2023. The wound has been in treatment 3 weeks. The wound is located on the Right,Medial Foot. The wound measures 0.2cm length x 0.2cm width x 0.1cm depth; 0.031cm^2 area and 0.003cm^3 volume. There is Fat Layer (Subcutaneous Tissue) exposed. There is no tunneling or undermining noted. There is a small amount of serous drainage noted. The wound margin is distinct with the outline attached to the wound base. There is large (67-100%) red, pink granulation within the wound bed. There is a small (1-33%) amount of necrotic tissue within the wound bed including Eschar. The periwound skin appearance had no abnormalities noted for texture. The periwound skin appearance had no abnormalities noted for moisture. The periwound skin appearance exhibited: Hemosiderin Staining. Periwound temperature was noted as No Abnormality. The periwound has tenderness on palpation. Assessment Active Problems ICD-10 Non-pressure chronic ulcer of other part of right foot with other specified severity Type 2 diabetes mellitus with foot ulcer Chronic venous hypertension (idiopathic) with ulcer and inflammation of right lower extremity Procedures Wound #2 Pre-procedure diagnosis of Wound #2 is a Venous Leg Ulcer located on the Right,Medial Foot .Severity of Tissue Pre Debridement is: Fat layer exposed. There was a Excisional Skin/Subcutaneous Tissue Debridement with a  total area of 0.03 sq cm performed by Duanne Guess, MD. With the following instrument(s): Curette to remove Non-Viable tissue/material. Material removed includes Eschar, Subcutaneous Tissue, and Slough after achieving pain control using Lidocaine 5% topical ointment. No specimens were taken. A time out was conducted at 08:00, prior to the start of the procedure. A Minimum amount of bleeding was controlled with Pressure. The procedure was tolerated well. Post Debridement Measurements: 0.2cm length x 0.2cm width x 0.1cm depth; 0.003cm^3 volume. Character of Wound/Ulcer Post Debridement is improved. Severity of Tissue Post Debridement is: Fat layer exposed. Post procedure Diagnosis Wound #2: Same as Pre-Procedure Pre-procedure diagnosis of Wound #2 is a  Venous Leg Ulcer located on the Right,Medial Foot . There was a Three Layer Compression Therapy Procedure by Redmond Pulling, RN. Post procedure Diagnosis Wound #2: Same as Pre-Procedure Ryan [name], Ryan [name] ([id number]) [phone].pdf Page 7 of 8 Plan Follow-up Appointments: Return Appointment in 1 week. - Dr Lady Gary 12/15/22 @ 0915 Rm 3 Return Appointment in 2 weeks. Other: - contact primary care Dr regarding nerve pain to possibly get prescription for gabapentin or pregabalin Call [facility] to get compression stockings Anesthetic: Wound #2 Right,Medial Foot: (In clinic) Topical Lidocaine 4% applied to wound bed Bathing/ Shower/ Hygiene: May shower with protection but do not get wound dressing(s) wet. Protect dressing(s) with water repellant cover (for example, large plastic bag) or a cast cover and may then take shower. - may purchase cast cover at [facility], [facility] or on-line Edema Control - Orders / Instructions: Elevate legs to the level of the heart or above for 30 minutes daily and/or when sitting for 3-4 times a day throughout the day. Avoid standing for long periods of time. Exercise regularly The following  medication(s) was prescribed: lidocaine topical 5 % ointment ointment topical once daily was prescribed at facility WOUND #2: - Foot Wound Laterality: Right, Medial Peri-Wound Care: Triamcinolone 15 (g) 1 x Per Week/30 Days Discharge Instructions: Use triamcinolone 15 (g) as directed Peri-Wound Care: Sween Lotion (Moisturizing lotion) 1 x Per Week/30 Days Discharge Instructions: Apply moisturizing lotion as directed Prim Dressing: Hydrofera Blue Ready Transfer Foam, 2.5x2.5 (in/in) 1 x Per Week/30 Days ary Discharge Instructions: Apply directly to wound bed as directed Secondary Dressing: Woven Gauze Sponge, Non-Sterile 4x4 in 1 x Per Week/30 Days Discharge Instructions: Apply over primary dressing as directed. Com pression Wrap: Urgo K2 Lite, (equivalent to a 3 layer) two layer compression system, regular 1 x Per Week/30 Days Discharge Instructions: Apply Urgo K2 Lite as directed (alternative to 3 layer compression). 12/08/2023: Vascular studies were completed and he has evidence of fairly significant venous reflux on the right leg. Arterial studies were normal. The wound is only a couple of millimeters. There is some slough and eschar on the surface. He continues to complain of significant pain. He is not on any medication for neuropathic pain. I used a curette to debride slough, eschar, and subcutaneous tissue from his wound. We are going to continue using Hydrofera Blue with Urgo light compression wrap. We did measure him for compression stockings , Which he will need after his wound is healed. We provided him with the information for [facility] in [place]. I am also going to refer him to vascular surgery to consult with them regarding possible saphenous vein ablation. I suggested he communicate with his primary care provider regarding initiation of gabapentin or pregabalin to address what I think is likely neuropathic pain. He will follow-up in 1 week. Electronic Signature(s) Signed:  12/08/2023 3:30:05 PM By: Shawn Stall RN, BSN Signed: 12/08/2023 3:44:17 PM By: Duanne Guess MD FACS Previous Signature: 12/08/2023 8:44:25 AM Version By: Duanne Guess MD FACS Entered By: Shawn Stall on 12/08/2023 11:36:50 -------------------------------------------------------------------------------- SuperBill Details Patient Name: Date of Service: Ryan [place] [name] 12/08/2023 Medical Record Number: [id number] Patient Account Number: [id number] Date of Birth/Sex: Treating RN: [birth year] (62 y.o. M) Primary Care Provider: [name] Other Clinician: Referring Provider: Treating Provider/Extender: Mariane Baumgarten in Treatment: 3 Diagnosis Coding ICD-10 Codes Code Description [phone] Non-pressure chronic ulcer of other part of right foot with other specified severity E11.621 Type 2 diabetes mellitus with foot ulcer I87.331  Chronic venous hypertension (idiopathic) with ulcer and inflammation of right lower extremity Facility Procedures : [facility] CPT4 CodeSiraj, Hutchings ([phone] 97 I Description: 4915) [phone] - DEBRIDE WOUND 1ST 20 SQ CM OR < CD-10 Diagnosis Description L97.518 Non-pressure chronic ulcer of other part of right foot with other specified severity Modifier: _[id number].p 1 Quantity: df Page 8 of 8 Physician Procedures : CPT4 Code Description Modifier [phone] [id number] - WC PHYS LEVEL 4 - EST PT 25 ICD-10 Diagnosis Description L97.518 Non-pressure chronic ulcer of other part of right foot with other specified severity E11.621 Type 2 diabetes mellitus with foot ulcer  I87.331 Chronic venous hypertension (idiopathic) with ulcer and inflammation of right lower extremity Quantity: 1 : [id number] - WC PHYS DEBR WO ANESTH 20 SQ CM ICD-10 Diagnosis Description L97.518 Non-pressure chronic ulcer of other part of right foot with other specified severity Quantity: 1 Electronic Signature(s) Signed: 12/08/2023 8:44:25  AM By: Duanne Guess MD FACS Entered By: Duanne Guess on 12/08/2023 05:17:06

## 2023-12-08 NOTE — Progress Notes (Signed)
FIELDEN, DEREMER R ([id number]) [id number] Page 1 of 8 Visit Report for 12/08/2023 Arrival Information Details Patient Name: Date of Service: Ryan [name], Ryan [name] [facility] [place] R. 12/08/2023 7:45 A M Medical Record Number: [id number] Patient Account Number: [id number] Date of Birth/Sex: Treating RN: Aug 26, 1961 (62 y.o. Cline Cools Primary Care Haydon Kalmar: [name] Other Clinician: Referring Dempsey Knotek: Treating Farron Lafond/Extender: Mariane Baumgarten in Treatment: 3 Visit Information History Since Last Visit Added or deleted any medications: No Patient Arrived: Ambulatory Any new allergies or adverse reactions: No Arrival Time: 07:50 Had a fall or experienced change in No Accompanied By: self activities of daily living that may affect Transfer Assistance: None risk of falls: Patient Identification Verified: Yes Signs or symptoms of abuse/neglect since last visito No Secondary Verification Process Completed: Yes Hospitalized since last visit: No Patient Requires Transmission-Based Precautions: No Implantable device outside of the clinic excluding No Patient Has Alerts: Yes cellular tissue based products placed in the center Patient Alerts: Patient on Blood Thinner since last visit: Has Dressing in Place as Prescribed: Yes Pain Present Now: Yes Electronic Signature(s) Signed: 12/08/2023 3:45:54 PM By: Redmond Pulling RN, BSN Entered By: Redmond Pulling on 12/08/2023 04:51:47 -------------------------------------------------------------------------------- Compression Therapy Details Patient Name: Date of Service: Ryan [name] R. 12/08/2023 7:45 A M Medical Record Number: [id number] Patient Account Number: [id number] Date of Birth/Sex: Treating RN: 1961/07/16 (62 y.o. Cline Cools Primary Care Branson Kranz: [name] Other Clinician: Referring Deonna Krummel: Treating Matthews Franks/Extender: Mariane Baumgarten in Treatment:  3 Compression Therapy Performed for Wound Assessment: Wound #2 Right,Medial Foot Performed By: Clinician Redmond Pulling, RN Compression Type: Three Layer Post Procedure Diagnosis Same as Pre-procedure Electronic Signature(s) Signed: 12/08/2023 3:45:54 PM By: Redmond Pulling RN, BSN Entered By: Redmond Pulling on 12/08/2023 05:26:00 [name] ([id number]) [id number] Page 2 of 8 -------------------------------------------------------------------------------- Encounter Discharge Information Details Patient Name: Date of Service: Ryan [name] [facility] [place] R. 12/08/2023 7:45 A M Medical Record Number: [id number] Patient Account Number: [id number] Date of Birth/Sex: Treating RN: 01-28-1961 (62 y.o. Cline Cools Primary Care Barnell Shieh: [name] Other Clinician: Referring Trixie Maclaren: Treating Augusta Hilbert/Extender: Mariane Baumgarten in Treatment: 3 Encounter Discharge Information Items Post Procedure Vitals Discharge Condition: Stable Temperature (F): 97.5 Ambulatory Status: Ambulatory Pulse (bpm): 80 Discharge Destination: Home Respiratory Rate (breaths/min): 18 Transportation: Private Auto Blood Pressure (mmHg): 148/88 Accompanied By: self Schedule Follow-up Appointment: Yes Clinical Summary of Care: Patient Declined Electronic Signature(s) Signed: 12/08/2023 3:45:54 PM By: Redmond Pulling RN, BSN Entered By: Redmond Pulling on 12/08/2023 05:26:59 -------------------------------------------------------------------------------- Lower Extremity Assessment Details Patient Name: Date of Service: Ryan [name] [facility] [name] R. 12/08/2023 7:45 A M Medical Record Number: [id number] Patient Account Number: [id number] Date of Birth/Sex: Treating RN: June 01, 1961 (62 y.o. Cline Cools Primary Care Pau Banh: [name] Other Clinician: Referring Jendayi Berling: Treating Larisa Lanius/Extender: Mariane Baumgarten in Treatment: 3 Edema  Assessment Assessed: [Left: No] [Right: No] Edema: [Left: Ye] [Right: s] Calf Left: Right: Point of Measurement: 37 cm From Medial Instep 38.5 cm Ankle Left: Right: Point of Measurement: 13 cm From Medial Instep 23 cm Knee To Floor Left: Right: From Medial Instep 45 cm Vascular Assessment Pulses: Dorsalis Pedis Palpable: [Right:Yes] Extremity colors, hair growth, and conditions: Extremity Color: [Right:Hyperpigmented] Hair Growth on Extremity: [Right:No] Temperature of Extremity: [Right:Warm] Capillary Refill: [Right:< 3 seconds] Dependent Rubor: [Right:No No] Electronic Signature(s) Signed: 12/08/2023 3:45:54 PM By: Redmond Pulling RN, BSN Loop, Mayfield R ([id number]) [id number] Page 3 of 8 Entered By: Redmond Pulling on 12/08/2023  05:15:58 -------------------------------------------------------------------------------- Multi Wound Chart Details Patient Name: Date of Service: Ryan [name] [facility] [place] R. 12/08/2023 7:45 A M Medical Record Number: [id number] Patient Account Number: [id number] Date of Birth/Sex: Treating RN: [birth year] (62 y.o. M) Primary Care Ariah Mower: [name] Other Clinician: Referring Jesslynn Kruck: Treating Tiphany Fayson/Extender: Mariane Baumgarten in Treatment: 3 Vital Signs Height(in): 69 Pulse(bpm): 80 Weight(lbs): 236 Blood Pressure(mmHg): 148/88 Body Mass Index(BMI): 34.8 Temperature(F): 97.5 Respiratory Rate(breaths/min): 18 [2:Photos:] [N/A:N/A] Right, Medial Foot N/A N/A Wound Location: Gradually Appeared N/A N/A Wounding Event: Venous Leg Ulcer N/A N/A Primary Etiology: Cataracts, Angina, Arrhythmia, N/A N/A Comorbid History: Hypertension, Type II Diabetes, Osteoarthritis 09/19/2023 N/A N/A Date Acquired: 3 N/A N/A Weeks of Treatment: Open N/A N/A Wound Status: No N/A N/A Wound Recurrence: 0.2x0.2x0.1 N/A N/A Measurements L x W x D (cm) 0.031 N/A N/A A (cm) : rea 0.003 N/A N/A Volume  (cm) : 96.30% N/A N/A % Reduction in A rea: 96.50% N/A N/A % Reduction in Volume: Full Thickness Without Exposed N/A N/A Classification: Support Structures Small N/A N/A Exudate A mount: Serous N/A N/A Exudate Type: amber N/A N/A Exudate Color: Distinct, outline attached N/A N/A Wound Margin: Large (67-100%) N/A N/A Granulation A mount: Red, Pink N/A N/A Granulation Quality: Small (1-33%) N/A N/A Necrotic A mount: Eschar N/A N/A Necrotic Tissue: Fat Layer (Subcutaneous Tissue): Yes N/A N/A Exposed Structures: Fascia: No Tendon: No Muscle: No Joint: No Bone: No Large (67-100%) N/A N/A Epithelialization: Debridement - Selective/Open Wound N/A N/A Debridement: Pre-procedure Verification/Time Out 08:00 N/A N/A Taken: Lidocaine 5% topical ointment N/A N/A Pain Control: Slough N/A N/A Tissue Debrided: Non-Viable Tissue N/A N/A Level: 0.03 N/A N/A Debridement A (sq cm): rea Curette N/A N/A Instrument: Minimum N/A N/A Bleeding: Pressure N/A N/A Hemostasis A chieved: Procedure was tolerated well N/A N/A Debridement Treatment ResponseBOHDI, BLINDER R ([id number]) [id number] Page 4 of 8 0.2x0.2x0.1 N/A N/A Post Debridement Measurements L x W x D (cm) 0.003 N/A N/A Post Debridement Volume: (cm) No Abnormalities Noted N/A N/A Periwound Skin Texture: No Abnormalities Noted N/A N/A Periwound Skin Moisture: Hemosiderin Staining: Yes N/A N/A Periwound Skin Color: No Abnormality N/A N/A Temperature: Yes N/A N/A Tenderness on Palpation: Debridement N/A N/A Procedures Performed: Treatment Notes Electronic Signature(s) Signed: 12/08/2023 8:44:25 AM By: Duanne Guess MD FACS Entered By: Duanne Guess on 12/08/2023 05:11:08 -------------------------------------------------------------------------------- Multi-Disciplinary Care Plan Details Patient Name: Date of Service: Ryan [name] R. 12/08/2023 7:45 A M Medical  Record Number: [id number] Patient Account Number: [id number] Date of Birth/Sex: Treating RN: 1961/08/10 (62 y.o. Cline Cools Primary Care Park Beck: [name] Other Clinician: Referring Eyad Rochford: Treating Ilana Prezioso/Extender: Mariane Baumgarten in Treatment: 3 Multidisciplinary Care Plan reviewed with physician Active Inactive Nutrition Nursing Diagnoses: Impaired glucose control: actual or potential Potential for alteratiion in Nutrition/Potential for imbalanced nutrition Goals: Patient/caregiver will maintain therapeutic glucose control Date Initiated: 11/14/2023 Target Resolution Date: 12/22/2023 Goal Status: Active Interventions: Assess HgA1c results as ordered upon admission and as needed Assess patient nutrition upon admission and as needed per policy Provide education on elevated blood sugars and impact on wound healing Treatment Activities: Patient referred to Primary Care Physician for further nutritional evaluation : 11/14/2023 Notes: Wound/Skin Impairment Nursing Diagnoses: Impaired tissue integrity Knowledge deficit related to ulceration/compromised skin integrity Goals: Patient/caregiver will verbalize understanding of skin care regimen Date Initiated: 11/14/2023 Target Resolution Date: 12/12/2023 Goal Status: Active Ulcer/skin breakdown will have a volume reduction of 30% by week 4 Date Initiated: 11/14/2023 Target Resolution Date:  12/12/2023 Goal Status: Active Interventions: Assess patient/caregiver ability to obtain necessary supplies MCKAY, FITZNER R ([id number]) [id number] Page 5 of 8 Assess patient/caregiver ability to perform ulcer/skin care regimen upon admission and as needed Assess ulceration(s) every visit Provide education on ulcer and skin care Treatment Activities: Skin care regimen initiated : 11/14/2023 Topical wound management initiated : 11/14/2023 Notes: Electronic Signature(s) Signed:  12/08/2023 3:45:54 PM By: Redmond Pulling RN, BSN Entered By: Redmond Pulling on 12/08/2023 04:58:52 -------------------------------------------------------------------------------- Pain Assessment Details Patient Name: Date of Service: Ryan [name] R. 12/08/2023 7:45 A M Medical Record Number: [id number] Patient Account Number: [id number] Date of Birth/Sex: Treating RN: August 22, 1961 (62 y.o. Cline Cools Primary Care Storm Dulski: [name] Other Clinician: Referring Charlott Calvario: Treating Abrar Koone/Extender: Mariane Baumgarten in Treatment: 3 Active Problems Location of Pain Severity and Description of Pain Patient Has Paino Yes Site Locations Rate the pain. Current Pain Level: 4 Pain Management and Medication Current Pain Management: Electronic Signature(s) Signed: 12/08/2023 3:45:54 PM By: Redmond Pulling RN, BSN Entered By: Redmond Pulling on 12/08/2023 04:52:44 -------------------------------------------------------------------------------- Patient/Caregiver Education Details Patient Name: Date of Service: Leola Brazil. 12/26/2024andnbsp7:45 A M Medical Record Number: [id number] Patient Account Number: [id number] CAILIN, TOOR ([id number]) [id number] Page 6 of 8 Date of Birth/Gender: Treating RN: 08-04-1961 (62 y.o. Cline Cools Primary Care Physician: [name] Other Clinician: Referring Physician: Treating Physician/Extender: Mariane Baumgarten in Treatment: 3 Education Assessment Education Provided To: Patient Education Topics Provided Wound/Skin Impairment: Methods: Explain/Verbal Responses: State content correctly Electronic Signature(s) Signed: 12/08/2023 3:45:54 PM By: Redmond Pulling RN, BSN Entered By: Redmond Pulling on 12/08/2023 05:00:04 -------------------------------------------------------------------------------- Wound Assessment Details Patient Name: Date of  Service: CLARANCE, THAGGARD [name] R. 12/08/2023 7:45 A M Medical Record Number: [id number] Patient Account Number: [id number] Date of Birth/Sex: Treating RN: 1961-07-22 (62 y.o. Cline Cools Primary Care Merrillyn Ackerley: [name] Other Clinician: Referring Manasseh Pittsley: Treating Jiali Linney/Extender: Mariane Baumgarten in Treatment: 3 Wound Status Wound Number: 2 Primary Venous Leg Ulcer Etiology: Wound Location: Right, Medial Foot Wound Status: Open Wounding Event: Gradually Appeared Comorbid Cataracts, Angina, Arrhythmia, Hypertension, Type II Date Acquired: 09/19/2023 History: Diabetes, Osteoarthritis Weeks Of Treatment: 3 Clustered Wound: No Photos Wound Measurements Length: (cm) 0.2 Width: (cm) 0.2 Depth: (cm) 0.1 Area: (cm) 0.031 Volume: (cm) 0.003 % Reduction in Area: 96.3% % Reduction in Volume: 96.5% Epithelialization: Large (67-100%) Tunneling: No Undermining: No Wound Description Classification: Full Thickness Without Exposed Support Structures Wound Margin: Distinct, outline attached Exudate Amount: Small Exudate Type: Serous Exudate Color: amber ARLICE, MAGOUIRK R ([id number]) Foul Odor After Cleansing: No Slough/Fibrino Yes [phone].pdf Page 7 of 8 Wound Bed Granulation Amount: Large (67-100%) Exposed Structure Granulation Quality: Red, Pink Fascia Exposed: No Necrotic Amount: Small (1-33%) Fat Layer (Subcutaneous Tissue) Exposed: Yes Necrotic Quality: Eschar Tendon Exposed: No Muscle Exposed: No Joint Exposed: No Bone Exposed: No Periwound Skin Texture Texture Color No Abnormalities Noted: Yes No Abnormalities Noted: No Hemosiderin Staining: Yes Moisture No Abnormalities Noted: Yes Temperature / Pain Temperature: No Abnormality Tenderness on Palpation: Yes Treatment Notes Wound #2 (Foot) Wound Laterality: Right, Medial Cleanser Peri-Wound Care Triamcinolone 15 (g) Discharge Instruction: Use triamcinolone  15 (g) as directed Sween Lotion (Moisturizing lotion) Discharge Instruction: Apply moisturizing lotion as directed Topical Primary Dressing Hydrofera Blue Ready Transfer Foam, 2.5x2.5 (in/in) Discharge Instruction: Apply directly to wound bed as directed Secondary Dressing Woven Gauze Sponge, Non-Sterile 4x4 in Discharge Instruction: Apply over primary dressing as directed. Secured With Compression Wrap  Urgo K2 Lite, (equivalent to a 3 layer) two layer compression system, regular Discharge Instruction: Apply Urgo K2 Lite as directed (alternative to 3 layer compression). Compression Stockings Add-Ons Electronic Signature(s) Signed: 12/08/2023 3:45:54 PM By: Redmond Pulling RN, BSN Entered By: Redmond Pulling on 12/08/2023 05:01:30 -------------------------------------------------------------------------------- Vitals Details Patient Name: Date of Service: Ryan [name] R. 12/08/2023 7:45 A M Medical Record Number: [id number] Patient Account Number: [id number] Date of Birth/Sex: Treating RN: August 06, 1961 (62 y.o. Cline Cools Primary Care Marshella Tello: [name] Other Clinician: Referring Wilmar Prabhakar: Treating Lisset Ketchem/Extender: Mariane Baumgarten in Treatment: 3 Vital Signs Time Taken: 07:51 Temperature (F): 97.5 Height (in): 69 Pulse (bpm): 80 Mankey, Dodge R ([id number]) [id number] Page 8 of 8 Weight (lbs): 236 Respiratory Rate (breaths/min): 18 Body Mass Index (BMI): 34.8 Blood Pressure (mmHg): 148/88 Reference Range: 80 - 120 mg / dl Electronic Signature(s) Signed: 12/08/2023 3:45:54 PM By: Redmond Pulling RN, BSN Entered By: Redmond Pulling on 12/08/2023 04:52:27

## 2023-12-16 ENCOUNTER — Encounter (HOSPITAL_BASED_OUTPATIENT_CLINIC_OR_DEPARTMENT_OTHER): Payer: 59 | Attending: Internal Medicine | Admitting: General Surgery

## 2023-12-16 DIAGNOSIS — L97518 Non-pressure chronic ulcer of other part of right foot with other specified severity: Secondary | ICD-10-CM | POA: Insufficient documentation

## 2023-12-16 DIAGNOSIS — Z794 Long term (current) use of insulin: Secondary | ICD-10-CM | POA: Insufficient documentation

## 2023-12-16 DIAGNOSIS — E11621 Type 2 diabetes mellitus with foot ulcer: Secondary | ICD-10-CM | POA: Insufficient documentation

## 2023-12-16 DIAGNOSIS — E1165 Type 2 diabetes mellitus with hyperglycemia: Secondary | ICD-10-CM | POA: Insufficient documentation

## 2023-12-16 DIAGNOSIS — I87301 Chronic venous hypertension (idiopathic) without complications of right lower extremity: Secondary | ICD-10-CM | POA: Diagnosis not present

## 2023-12-16 DIAGNOSIS — I87331 Chronic venous hypertension (idiopathic) with ulcer and inflammation of right lower extremity: Secondary | ICD-10-CM | POA: Insufficient documentation

## 2023-12-16 DIAGNOSIS — I4892 Unspecified atrial flutter: Secondary | ICD-10-CM | POA: Insufficient documentation

## 2023-12-16 DIAGNOSIS — Z7901 Long term (current) use of anticoagulants: Secondary | ICD-10-CM | POA: Insufficient documentation

## 2023-12-16 DIAGNOSIS — I4891 Unspecified atrial fibrillation: Secondary | ICD-10-CM | POA: Insufficient documentation

## 2023-12-16 NOTE — Progress Notes (Addendum)
 Ryan [name], BOEHLKE [name] ([id number]) [id number] Page 1 of 6 Visit Report for 12/16/2023 Chief Complaint Document Details Patient Name: Date of Service: MERIT, MAYBEE [facility] [name] 12/16/2023 9:15 A M Medical Record Number: [id number] Patient Account Number: [id number] Date of Birth/Sex: Treating RN: Jan 28, 1961 (63 y.o. M) Primary Care Provider: Scarlett Shuck Other Clinician: Referring Provider: Treating Provider/Extender: Marolyn Delon Scarlett Shuck Devra in Treatment: 4 Information Obtained from: Patient Chief Complaint Patients presents for treatment of an open diabetic ulcer on his medial right lower leg 11/14/2023; patient returns to clinic with a wound on his right medial foot Electronic Signature(s) Signed: 12/16/2023 9:59:39 AM By: Marolyn Delon MD FACS Entered By: Marolyn Delon on 12/16/2023 09:59:39 -------------------------------------------------------------------------------- HPI Details Patient Name: Date of Service: Ryan [name] [name] 12/16/2023 9:15 A M Medical Record Number: [id number] Patient Account Number: [id number] Date of Birth/Sex: Treating RN: 02-Jun-1961 (62 y.o. M) Primary Care Provider: Scarlett Shuck Other Clinician: Referring Provider: Treating Provider/Extender: Marolyn Delon Scarlett Shuck Devra in Treatment: 4 History of Present Illness HPI Description: ADMISSION 10/01/2022 This is a 63 year old poorly controlled type II diabetic (last hemoglobin A1c 10%) with hypertension and history of substance abuse who presents to clinic today with a wound on his right medial lower leg. He says that he is not entirely sure how it started, but it has been present for about a month. He says that the wound and the area surrounding it are exquisitely tender. His primary care provider prescribed a steroid cream which she has been using, but he says it has not helped. ABI in clinic today was noncompressible, but he has easily palpable pulses. On  the medial aspect of his right lower leg, there is a small wound exposing the fat layer. There is slough and eschar on the surface. There is no odor or purulent drainage. 09/23/2022: The PCR culture that I took last week grew out Escherichia coli. I prescribed a course of oral Augmentin  and the patient is taking this currently. The wound is a little bit smaller and less tender. There is still slough and eschar buildup. 10/20; the patient's wound is closed today. He has chronic venous insufficiency. He did not come in with any compression stockings READMISSION 11/14/2023 This is a patient we had in the clinic in 2023 for 3 visits. At that time he had a wound on the right leg felt to be secondary to chronic venous insufficiency. He also has type 2 diabetes which is generally been poorly controlled. He was discharged with compression stockings. He comes in this time with a painful wound on the right medial foot which he said is has been there for about 2 to 3 months. Not really clear what he has been using on the wound but there is been no progress. He finds this very uncomfortable. Past medical history includes an Achilles tendon rupture that was repaired about 4 years ago, he has A-fib and flutter and was supposed to start Eliquis  but he only recently got this through Medicaid. He has type 2 diabetes on insulin  rate most recent hemoglobin A1c of 9.5 ABI in our clinic was 1.02 on the right 12/12; this is a patient who is a poorly controlled type II diabetic. He is also felt to have chronic venous insufficiency. He has an exceptionally painful wound DEANO, TOMASZEWSKI [name] ([id number]) [id number] Page 2 of 6 on the right medial ankle just below the medial malleolus. When I saw him for the first time last week I thought this was almost all  venous. His ABI in our clinic was quite normal at 1.02 and he has palpable dorsalis pedis and posterior tibial pulses. We used Hydrofera Blue  under Urgo K2 compression He comes in today with the wound looking smaller to me although this would not be an easy wound to do accurate measurements. He is still complaining of almost continuous shooting pain including some that goes above the wound and up his leg. 12/19; the patient is being treated here for a wound on the right medial ankle just below the medial malleolus. My feeling here with that this was secondary to venous insufficiency. We have been using Hydrofera Blue under Urgo K2 compression. Last week he looks smaller and that improvement in wound dimensions is continued to get better. He says the pain is better as well. He has his vascular studies next week including reflux studies and arterial studies including TBI's 12/08/2023: His vascular studies were completed. Results copied here: ABI: +-------+-----------+-----------+------------+------------+ ABI/TBIT oday's ABIT oday's TBIPrevious ABIPrevious TBI +-------+-----------+-----------+------------+------------+ Right 1.26 0.77    +-------+-----------+-----------+------------+------------+ Left 1.22 0.87    +-------+-----------+-----------+------------+------------+ Venous Reflux: Right: - No evidence of deep vein thrombosis seen in the right lower extremity, from the common femoral through the popliteal veins. - Venous reflux is noted in the right common femoral, femoral and popliteal veins. - Venous reflux is noted in the right sapheno-femoral junction and great saphenous vein (thigh and calf) The wound is only a couple of millimeters. There is some slough and eschar on the surface. He continues to complain of significant pain. He is not on any medication for neuropathic pain. 12/16/2023: His wound is healed. He has an appointment with vascular surgery coming up on Monday, 20 January to discuss venous ablation. He is still awaiting compression stockings. Electronic Signature(s) Signed: 12/16/2023 10:12:15  AM By: Marolyn Nest MD FACS Previous Signature: 12/16/2023 10:00:05 AM Version By: Marolyn Nest MD FACS Entered By: Marolyn Nest on 12/16/2023 10:12:14 -------------------------------------------------------------------------------- Physical Exam Details Patient Name: Date of Service: Ryan [name], Ryan [name] [name] 12/16/2023 9:15 A M Medical Record Number: [id number] Patient Account Number: [id number] Date of Birth/Sex: Treating RN: 1961-06-18 (63 y.o. M) Primary Care Provider: Scarlett Shuck Other Clinician: Referring Provider: Treating Provider/Extender: Marolyn Nest Scarlett Shuck Devra in Treatment: 4 Constitutional Hypertensive, asymptomatic. . . . SABRA Respiratory Normal work of breathing on room air.. Notes 12/16/2023: His wound is healed. Electronic Signature(s) Signed: 12/16/2023 10:12:44 AM By: Marolyn Nest MD FACS Entered By: Marolyn Nest on 12/16/2023 10:12:43 Ryan CURTISTINE SAUNDERS ([id number]) [id number] Page 3 of 6 -------------------------------------------------------------------------------- Physician Orders Details Patient Name: Date of Service: CHARAN, PRIETO [facility] [name] 12/16/2023 9:15 A M Medical Record Number: [id number] Patient Account Number: [id number] Date of Birth/Sex: Treating RN: Jul 19, 1961 (63 y.o. NETTY Merleen Handing Primary Care Provider: Scarlett Shuck Other Clinician: Referring Provider: Treating Provider/Extender: Marolyn Nest Scarlett Shuck Devra in Treatment: 4 The following information was scribed by: Merleen Handing The information was scribed for: Marolyn Nest Verbal / Phone Orders: No Diagnosis Coding ICD-10 Coding Code Description L97.518 Non-pressure chronic ulcer of other part of right foot with other specified severity E11.621 Type 2 diabetes mellitus with foot ulcer I87.331 Chronic venous hypertension (idiopathic) with ulcer and inflammation of right lower extremity Discharge From [facility]  Services Discharge from Wound Care Center - Call [employer] to get compression stockings Keep appointment with Vein and Vascular on 01/02/24 Bathing/ Shower/ Hygiene May shower and wash wound with soap and water. Edema Control - Orders / Instructions Right Lower Extremity Elevate legs  to the level of the heart or above for 30 minutes daily and/or when sitting for 3-4 times a day throughout the day. Avoid standing for long periods of time. Exercise regularly Moisturize legs daily. Compression stocking or Garment 20-30 mm/Hg pressure to: - both legs daily Electronic Signature(s) Signed: 12/16/2023 10:17:32 AM By: Marolyn Nest MD FACS Entered By: Marolyn Nest on 12/16/2023 10:17:32 -------------------------------------------------------------------------------- Problem List Details Patient Name: Date of Service: Ryan [name] [name] 12/16/2023 9:15 A M Medical Record Number: [id number] Patient Account Number: [id number] Date of Birth/Sex: Treating RN: Jul 24, 1961 (63 y.o. NETTY Merleen Handing Primary Care Provider: Scarlett Shuck Other Clinician: Referring Provider: Treating Provider/Extender: Marolyn Nest Scarlett Shuck Devra in Treatment: 4 Active Problems ICD-10 Encounter Code Description Active Date MDM Diagnosis L97.518 Non-pressure chronic ulcer of other part of right foot with other specified 11/14/2023 No Yes severity E11.621 Type 2 diabetes mellitus with foot ulcer 11/14/2023 No Yes I87.331 Chronic venous hypertension (idiopathic) with ulcer and inflammation of right 11/14/2023 No Yes lower extremity SABASTIEN, TYLER [name] ([id number]) [phone].pdf Page 4 of 6 Inactive Problems Resolved Problems Electronic Signature(s) Signed: 12/16/2023 9:59:27 AM By: Marolyn Nest MD FACS Entered By: Marolyn Nest on 12/16/2023 09:59:27 -------------------------------------------------------------------------------- Progress Note Details Patient  Name: Date of Service: Ryan [name], Ryan [name] [name] 12/16/2023 9:15 A M Medical Record Number: [id number] Patient Account Number: [id number] Date of Birth/Sex: Treating RN: [birth year] (63 y.o. M) Primary Care Provider: Scarlett Shuck Other Clinician: Referring Provider: Treating Provider/Extender: Marolyn Nest Scarlett Shuck Devra in Treatment: 4 Subjective Chief Complaint Information obtained from Patient Patients presents for treatment of an open diabetic ulcer on his medial right lower leg 11/14/2023; patient returns to clinic with a wound on his right medial foot History of Present Illness (HPI) ADMISSION 10/01/2022 This is a 63 year old poorly controlled type II diabetic (last hemoglobin A1c 10%) with hypertension and history of substance abuse who presents to clinic today with a wound on his right medial lower leg. He says that he is not entirely sure how it started, but it has been present for about a month. He says that the wound and the area surrounding it are exquisitely tender. His primary care provider prescribed a steroid cream which she has been using, but he says it has not helped. ABI in clinic today was noncompressible, but he has easily palpable pulses. On the medial aspect of his right lower leg, there is a small wound exposing the fat layer. There is slough and eschar on the surface. There is no odor or purulent drainage. 09/23/2022: The PCR culture that I took last week grew out Escherichia coli. I prescribed a course of oral Augmentin  and the patient is taking this currently. The wound is a little bit smaller and less tender. There is still slough and eschar buildup. 10/20; the patient's wound is closed today. He has chronic venous insufficiency. He did not come in with any compression stockings READMISSION 11/14/2023 This is a patient we had in the clinic in 2023 for 3 visits. At that time he had a wound on the right leg felt to be secondary to chronic venous insufficiency.  He also has type 2 diabetes which is generally been poorly controlled. He was discharged with compression stockings. He comes in this time with a painful wound on the right medial foot which he said is has been there for about 2 to 3 months. Not really clear what he has been using on the wound but there is been no progress. He finds  this very uncomfortable. Past medical history includes an Achilles tendon rupture that was repaired about 4 years ago, he has A-fib and flutter and was supposed to start Eliquis  but he only recently got this through Medicaid. He has type 2 diabetes on insulin  rate most recent hemoglobin A1c of 9.5 ABI in our clinic was 1.02 on the right 12/12; this is a patient who is a poorly controlled type II diabetic. He is also felt to have chronic venous insufficiency. He has an exceptionally painful wound on the right medial ankle just below the medial malleolus. When I saw him for the first time last week I thought this was almost all venous. His ABI in our clinic was quite normal at 1.02 and he has palpable dorsalis pedis and posterior tibial pulses. We used Hydrofera Blue under Urgo K2 compression He comes in today with the wound looking smaller to me although this would not be an easy wound to do accurate measurements. He is still complaining of almost continuous shooting pain including some that goes above the wound and up his leg. 12/19; the patient is being treated here for a wound on the right medial ankle just below the medial malleolus. My feeling here with that this was secondary to venous insufficiency. We have been using Hydrofera Blue under Urgo K2 compression. Last week he looks smaller and that improvement in wound dimensions is continued to get better. He says the pain is better as well. He has his vascular studies next week including reflux studies and arterial studies including TBI's 12/08/2023: His vascular studies were completed. Results copied  here: ABI: +-------+-----------+-----------+------------+------------+ ABI/TBIT oday's ABIT oday's TBIPrevious ABIPrevious TBI +-------+-----------+-----------+------------+------------+ Right 1.26 0.77    +-------+-----------+-----------+------------+------------+ Left 1.22 0.87    Ryan [name], Ryan [name] ([id number]) [phone].pdf Page 5 of 6 +-------+-----------+-----------+------------+------------+ Venous Reflux: Right: - No evidence of deep vein thrombosis seen in the right lower extremity, from the common femoral through the popliteal veins. - Venous reflux is noted in the right common femoral, femoral and popliteal veins. - Venous reflux is noted in the right sapheno-femoral junction and great saphenous vein (thigh and calf) The wound is only a couple of millimeters. There is some slough and eschar on the surface. He continues to complain of significant pain. He is not on any medication for neuropathic pain. 12/16/2023: His wound is healed. He has an appointment with vascular surgery coming up on Monday, 20 January to discuss venous ablation. He is still awaiting compression stockings. Objective Constitutional Hypertensive, asymptomatic. Vitals Time Taken: 9:31 AM, Height: 69 in, Weight: 236 lbs, BMI: 34.8, Temperature: 97.6 F, Pulse: 67 bpm, Respiratory Rate: 20 breaths/min, Blood Pressure: 152/89 mmHg. Respiratory Normal work of breathing on room air.. General Notes: 12/16/2023: His wound is healed. Integumentary (Hair, Skin) Wound #2 status is Healed - Epithelialized. Original cause of wound was Gradually Appeared. The date acquired was: 09/19/2023. The wound has been in treatment 4 weeks. The wound is located on the Right,Medial Foot. The wound measures 0cm length x 0cm width x 0cm depth; 0cm^2 area and 0cm^3 volume. There is Fat Layer (Subcutaneous Tissue) exposed. There is no tunneling or undermining noted. There is a none present  amount of drainage noted. The wound margin is distinct with the outline attached to the wound base. There is no granulation within the wound bed. There is a large (67-100%) amount of necrotic tissue within the wound bed including Eschar. The periwound skin appearance had no abnormalities noted for texture. The periwound skin appearance had  no abnormalities noted for moisture. The periwound skin appearance exhibited: Hemosiderin Staining. Periwound temperature was noted as No Abnormality. The periwound has tenderness on palpation. Assessment Active Problems ICD-10 Non-pressure chronic ulcer of other part of right foot with other specified severity Type 2 diabetes mellitus with foot ulcer Chronic venous hypertension (idiopathic) with ulcer and inflammation of right lower extremity Plan Discharge From [facility] Services: Discharge from Wound Care Center - Call [employer] to get compression stockings Keep appointment with Vein and Vascular on 01/02/24 Bathing/ Shower/ Hygiene: May shower and wash wound with soap and water. Edema Control - Orders / Instructions: Elevate legs to the level of the heart or above for 30 minutes daily and/or when sitting for 3-4 times a day throughout the day. Avoid standing for long periods of time. Exercise regularly Moisturize legs daily. Compression stocking or Garment 20-30 mm/Hg pressure to: - both legs daily 12/16/2023: His wound is healed. He has an appointment with vascular surgery coming up on Monday, 20 January to discuss venous ablation. He is still awaiting compression stockings. He was reminded of the importance of daily compression stocking use as soon as he receives his. We gave him Tubigrip to wear home today. He should keep his appointment with vein and vascular. We also discussed the importance of keeping his skin well moisturized and of leg elevation is much as possible during ETTORE, TREBILCOCK [name] ([id number]) [id number]  Page 6 of 6 the day and at night when he is sleeping. We will discharge him from the wound care center. He may follow-up in the future, should the need arise. Electronic Signature(s) Signed: 12/16/2023 10:18:39 AM By: Marolyn Nest MD FACS Entered By: Marolyn Nest on 12/16/2023 10:18:39 -------------------------------------------------------------------------------- SuperBill Details Patient Name: Date of Service: Ryan DELENA NORLENE PAMALA [name] 12/16/2023 Medical Record Number: [id number] Patient Account Number: [id number] Date of Birth/Sex: Treating RN: [birth year] (62 y.o. M) Primary Care Provider: Scarlett Shuck Other Clinician: Referring Provider: Treating Provider/Extender: Marolyn Nest Scarlett Shuck Devra in Treatment: 4 Diagnosis Coding ICD-10 Codes Code Description [phone] Non-pressure chronic ulcer of other part of right foot with other specified severity E11.621 Type 2 diabetes mellitus with foot ulcer I87.331 Chronic venous hypertension (idiopathic) with ulcer and inflammation of right lower extremity Facility Procedures : CPT4 Code: [id number] Description: 99213 - WOUND CARE VISIT-LEV 3 EST PT Modifier: Quantity: 1 Physician Procedures : CPT4 Code Description Modifier [id number] - WC PHYS LEVEL 2 - EST PT ICD-10 Diagnosis Description L97.518 Non-pressure chronic ulcer of other part of right foot with other specified severity E11.621 Type 2 diabetes mellitus with foot ulcer I87.331  Chronic venous hypertension (idiopathic) with ulcer and inflammation of right lower extremity Quantity: 1 Electronic Signature(s) Signed: 12/16/2023 12:14:34 PM By: Marolyn Nest MD FACS Signed: 12/16/2023 12:47:24 PM By: Merleen Handing RN, BSN Previous Signature: 12/16/2023 10:18:52 AM Version By: Marolyn Nest MD FACS Entered By: Merleen Handing on 12/16/2023 12:09:12

## 2023-12-16 NOTE — Progress Notes (Signed)
 KYLIAN, LOH [name] ([id number]) [id number] Page 1 of 8 Visit Report for 12/16/2023 Arrival Information Details Patient Name: Date of Service: Ryan [name], Ryan [name] [facility] [name] 12/16/2023 9:15 A M Medical Record Number: [id number] Patient Account Number: [id number] Date of Birth/Sex: Treating RN: [birth year] (63 y.o. NETTY Drury Nestle Primary Care Diamon Reddinger: Scarlett Shuck Other Clinician: Referring Stephan Draughn: Treating Devere Brem/Extender: Marolyn Delon Scarlett Shuck Devra in Treatment: 4 Visit Information History Since Last Visit Added or deleted any medications: No Patient Arrived: Ambulatory Any new allergies or adverse reactions: No Arrival Time: 09:31 Had a fall or experienced change in No Accompanied By: self activities of daily living that may affect Transfer Assistance: None risk of falls: Patient Identification Verified: Yes Signs or symptoms of abuse/neglect since last visito No Secondary Verification Process Completed: Yes Hospitalized since last visit: No Patient Requires Transmission-Based Precautions: No Implantable device outside of the clinic excluding No Patient Has Alerts: Yes cellular tissue based products placed in the center Patient Alerts: Patient on Blood Thinner since last visit: Has Dressing in Place as Prescribed: Yes Has Compression in Place as Prescribed: Yes Pain Present Now: No Electronic Signature(s) Signed: 12/16/2023 12:56:04 PM By: Drury Nestle RN, BSN Entered By: Drury Nestle on 12/16/2023 09:31:34 -------------------------------------------------------------------------------- Clinic Level of Care Assessment Details Patient Name: Date of Service: Ryan [name], Ryan [name] [facility] [place] [name] 12/16/2023 9:15 A M Medical Record Number: [id number] Patient Account Number: [id number] Date of Birth/Sex: Treating RN: 08-09-1961 (63 y.o. NETTY Merleen Handing Primary Care Larrell Rapozo: Scarlett Shuck Other Clinician: Referring Harinder Romas: Treating Khamora Karan/Extender: Marolyn Delon Scarlett Shuck Devra in Treatment: 4 Clinic Level of Care Assessment Items TOOL 4 Quantity Score []  - 0 Use when only an EandM is performed on FOLLOW-UP visit ASSESSMENTS - Nursing Assessment / Reassessment X- 1 10 Reassessment of Co-morbidities (includes updates in patient status) X- 1 5 Reassessment of Adherence to Treatment Plan ASSESSMENTS - Wound and Skin A ssessment / Reassessment X - Simple Wound Assessment / Reassessment - one wound 1 5 []  - 0 Complex Wound Assessment / Reassessment - multiple wounds []  - 0 Dermatologic / Skin Assessment (not related to wound area) ASSESSMENTS - Focused Assessment X- 1 5 Circumferential Edema Measurements - multi extremities []  - 0 Nutritional Assessment / Counseling / Intervention Ryan [name], Ryan [name] [name] ([id number]) [phone] Page 2 of 8 X- 1 5 Lower Extremity Assessment (monofilament, tuning fork, pulses) []  - 0 Peripheral Arterial Disease Assessment (using hand held doppler) ASSESSMENTS - Ostomy and/or Continence Assessment and Care []  - 0 Incontinence Assessment and Management []  - 0 Ostomy Care Assessment and Management (repouching, etc.) PROCESS - Coordination of Care X - Simple Patient / Family Education for ongoing care 1 15 []  - 0 Complex (extensive) Patient / Family Education for ongoing care X- 1 10 Staff obtains Chiropractor, Records, T Results / Process Orders est []  - 0 Staff telephones HHA, Nursing Homes / Clarify orders / etc []  - 0 Routine Transfer to another Facility (non-emergent condition) []  - 0 Routine Hospital Admission (non-emergent condition) []  - 0 New Admissions / Manufacturing Engineer / Ordering NPWT Apligraf, etc. , []  - 0 Emergency Hospital Admission (emergent condition) X- 1 10 Simple Discharge Coordination []  - 0 Complex (extensive) Discharge Coordination PROCESS - Special Needs []  - 0 Pediatric / Minor Patient Management []  - 0 Isolation Patient  Management []  - 0 Hearing / Language / Visual special needs []  - 0 Assessment of Community assistance (transportation, D/C planning, etc.) []  - 0 Additional assistance / Altered mentation []  - 0 Support  Surface(s) Assessment (bed, cushion, seat, etc.) INTERVENTIONS - Wound Cleansing / Measurement X - Simple Wound Cleansing - one wound 1 5 []  - 0 Complex Wound Cleansing - multiple wounds X- 1 5 Wound Imaging (photographs - any number of wounds) []  - 0 Wound Tracing (instead of photographs) X- 1 5 Simple Wound Measurement - one wound []  - 0 Complex Wound Measurement - multiple wounds INTERVENTIONS - Wound Dressings []  - 0 Small Wound Dressing one or multiple wounds []  - 0 Medium Wound Dressing one or multiple wounds []  - 0 Large Wound Dressing one or multiple wounds []  - 0 Application of Medications - topical []  - 0 Application of Medications - injection INTERVENTIONS - Miscellaneous []  - 0 External ear exam []  - 0 Specimen Collection (cultures, biopsies, blood, body fluids, etc.) []  - 0 Specimen(s) / Culture(s) sent or taken to Lab for analysis []  - 0 Patient Transfer (multiple staff / Nurse, Adult / Similar devices) []  - 0 Simple Staple / Suture removal (25 or less) []  - 0 Complex Staple / Suture removal (26 or more) []  - 0 Hypo / Hyperglycemic Management (close monitor of Blood Glucose) Quadros, Nasean [name] ([id number]) [id number] Page 3 of 8 []  - 0 Ankle / Brachial Index (ABI) - do not check if billed separately X- 1 5 Vital Signs Has the patient been seen at the hospital within the last three years: Yes Total Score: 85 Level Of Care: New/Established - Level 3 Electronic Signature(s) Signed: 12/16/2023 12:47:24 PM By: Merleen Handing RN, BSN Entered By: Boehlein, Linda on 12/16/2023 12:09:02 -------------------------------------------------------------------------------- Encounter Discharge Information Details Patient Name: Date of  Service: Ryan [name] 12/16/2023 9:15 A M Medical Record Number: [id number] Patient Account Number: [id number] Date of Birth/Sex: Treating RN: 1961-07-23 (63 y.o. NETTY Claven Pollen Primary Care Donnell Wion: Scarlett Shuck Other Clinician: Referring Roshawnda Pecora: Treating Shantel Wesely/Extender: Marolyn Delon Scarlett Shuck Devra in Treatment: 4 Encounter Discharge Information Items Discharge Condition: Stable Ambulatory Status: Ambulatory Discharge Destination: Home Transportation: Private Auto Accompanied By: self Schedule Follow-up Appointment: Yes Clinical Summary of Care: Patient Declined Electronic Signature(s) Signed: 12/16/2023 10:09:12 AM By: Claven Pollen RN Entered By: Claven Pollen on 12/16/2023 10:08:35 -------------------------------------------------------------------------------- Lower Extremity Assessment Details Patient Name: Date of Service: Ryan [name], Ryan [name] [place] [name] 12/16/2023 9:15 A M Medical Record Number: [id number] Patient Account Number: [id number] Date of Birth/Sex: Treating RN: 1961-11-21 (63 y.o. NETTY Drury Nestle Primary Care Josselin Gaulin: Scarlett Shuck Other Clinician: Referring Satsuki Zillmer: Treating Chistine Dematteo/Extender: Marolyn Delon Scarlett Shuck Devra in Treatment: 4 Edema Assessment Assessed: [Left: No] [Right: Yes] Edema: [Left: N] [Right: o] Calf Left: Right: Point of Measurement: 37 cm From Medial Instep 34 cm Ankle Left: Right: Point of Measurement: 13 cm From Medial Instep 22 cm Vascular Assessment Left: [id number] Page 4 of 8Right:] Pulses: Dorsalis Pedis Palpable: [phone].pdf Page 4 of 8Yes] Extremity colors, hair growth, and conditions: Extremity Color: [phone].pdf Page 4 of 8Hyperpigmented] Hair Growth on Extremity: [phone].pdf Page 4 of 8No] Temperature of Extremity: [phone].pdf Page 4 of 8Warm] Capillary  Refill: [phone].pdf Page 4 of 8< 3 seconds] Dependent Rubor: [phone].pdf Page 4 of 8No] Blanched when Elevated: [phone].pdf Page 4 of 8No No] Toe Nail Assessment Left: Right: Thick: No Discolored: Yes Deformed: No Improper Length and Hygiene: Yes Electronic Signature(s) Signed: 12/16/2023 12:56:04 PM By: Drury Nestle RN, BSN Entered By: Drury Nestle on 12/16/2023 09:35:56 -------------------------------------------------------------------------------- Multi Wound Chart Details Patient Name: Date of Service: Ryan [name] 12/16/2023 9:15 A M Medical Record Number: [id number] Patient Account  Number: [id number] Date of Birth/Sex: Treating RN: 29-Dec-1960 (63 y.o. M) Primary Care Manasa Spease: Scarlett Shuck Other Clinician: Referring Anna-Marie Coller: Treating Dellar Traber/Extender: Marolyn Delon Scarlett Shuck Devra in Treatment: 4 Vital Signs Height(in): 69 Pulse(bpm): 67 Weight(lbs): 236 Blood Pressure(mmHg): 152/89 Body Mass Index(BMI): 34.8 Temperature(F): 97.6 Respiratory Rate(breaths/min): 20 [2:Photos:] [N/A:N/A] Right, Medial Foot N/A N/A Wound Location: Gradually Appeared N/A N/A Wounding Event: Venous Leg Ulcer N/A N/A Primary Etiology: Cataracts, Angina, Arrhythmia, N/A N/A Comorbid History: Hypertension, Type II Diabetes, Osteoarthritis 09/19/2023 N/A N/A Date Acquired: 4 N/A N/A Weeks of Treatment: Healed - Epithelialized N/A N/A Wound Status: No N/A N/A Wound Recurrence: 0x0x0 N/A N/A Measurements L x W x D (cm) 0 N/A N/A A (cm) : rea 0 N/A N/A Volume (cm) : 100.00% N/A N/A % Reduction in Area: 100.00% N/A N/A % Reduction in Volume: Full Thickness Without Exposed N/A N/A Classification: Support Structures Ryan [name], Ryan [name] ([id number]) [id number] Page 5 of 8 None Present N/A N/A Exudate A mount: Distinct, outline attached N/A N/A Wound  Margin: None Present (0%) N/A N/A Granulation Amount: Large (67-100%) N/A N/A Necrotic Amount: Eschar N/A N/A Necrotic Tissue: Fat Layer (Subcutaneous Tissue): Yes N/A N/A Exposed Structures: Fascia: No Tendon: No Muscle: No Joint: No Bone: No Large (67-100%) N/A N/A Epithelialization: No Abnormalities Noted N/A N/A Periwound Skin Texture: No Abnormalities Noted N/A N/A Periwound Skin Moisture: Hemosiderin Staining: Yes N/A N/A Periwound Skin Color: No Abnormality N/A N/A Temperature: Yes N/A N/A Tenderness on Palpation: Treatment Notes Electronic Signature(s) Signed: 12/16/2023 9:59:33 AM By: Marolyn Delon MD FACS Entered By: Marolyn Delon on 12/16/2023 09:59:33 -------------------------------------------------------------------------------- Multi-Disciplinary Care Plan Details Patient Name: Date of Service: Ryan [name] 12/16/2023 9:15 A M Medical Record Number: [id number] Patient Account Number: [id number] Date of Birth/Sex: Treating RN: Feb 13, 1961 (63 y.o. NETTY Merleen Handing Primary Care Marlea Gambill: Scarlett Shuck Other Clinician: Referring Johnsie Moscoso: Treating Kasmira Cacioppo/Extender: Marolyn Delon Scarlett Shuck Devra in Treatment: 4 Multidisciplinary Care Plan reviewed with physician Active Inactive Electronic Signature(s) Signed: 12/16/2023 12:47:24 PM By: Boehlein, Linda RN, BSN Entered By: Boehlein, Linda on 12/16/2023 09:53:09 -------------------------------------------------------------------------------- Pain Assessment Details Patient Name: Date of Service: Ryan [name], Ryan [name] [place] [name] 12/16/2023 9:15 A M Medical Record Number: [id number] Patient Account Number: [id number] Date of Birth/Sex: Treating RN: August 11, 1961 (64 y.o. NETTY Drury Nestle Primary Care Telesia Ates: Scarlett Shuck Other Clinician: Referring Jorge Amparo: Treating Shelonda Saxe/Extender: Marolyn Delon Scarlett Shuck Devra in Treatment: 4 Active Problems Location of Pain Severity and Description of  Pain Patient Has Paino No Site Locations [place], [place] [name] ([id number]) [id number] Page 6 of 8 Pain Management and Medication Current Pain Management: Electronic Signature(s) Signed: 12/16/2023 12:56:04 PM By: Drury Nestle RN, BSN Entered By: Drury Nestle on 12/16/2023 09:32:50 -------------------------------------------------------------------------------- Patient/Caregiver Education Details Patient Name: Date of Service: Ryan DELENA HIDES PAMALA FABIENE 1/3/2025andnbsp9:15 A M Medical Record Number: [id number] Patient Account Number: [id number] Date of Birth/Gender: Treating RN: Apr 18, 1961 (63 y.o. NETTY Merleen Handing Primary Care Physician: Scarlett Shuck Other Clinician: Referring Physician: Treating Physician/Extender: Marolyn Delon Scarlett Shuck Devra in Treatment: 4 Education Assessment Education Provided To: Patient Education Topics Provided Elevated Blood Sugar/ Impact on Healing: Methods: Explain/Verbal Responses: Reinforcements needed, State content correctly Venous: Methods: Explain/Verbal Responses: Reinforcements needed, State content correctly Wound/Skin Impairment: Methods: Explain/Verbal Responses: Reinforcements needed, State content correctly Electronic Signature(s) Signed: 12/16/2023 12:47:24 PM By: Merleen Handing RN, BSN Entered By: Merleen Handing on 12/16/2023 09:52:34 Ryan [name] ([id number]) [id number] Page 7 of 8 -------------------------------------------------------------------------------- Wound Assessment Details Patient Name: Date of Service: Ryan [name], Ryan [name] [facility]  [name] 12/16/2023 9:15 A M Medical Record Number: [id number] Patient Account Number: [id number] Date of Birth/Sex: Treating RN: 06-02-1961 (63 y.o. NETTY Merleen Handing Primary Care Kadiatou Oplinger: Scarlett Shuck Other Clinician: Referring Kennon Encinas: Treating Artrell Lawless/Extender: Marolyn Delon Scarlett Shuck Devra in Treatment: 4 Wound Status Wound  Number: 2 Primary Venous Leg Ulcer Etiology: Wound Location: Right, Medial Foot Wound Status: Healed - Epithelialized Wounding Event: Gradually Appeared Comorbid Cataracts, Angina, Arrhythmia, Hypertension, Type II Date Acquired: 09/19/2023 History: Diabetes, Osteoarthritis Weeks Of Treatment: 4 Clustered Wound: No Photos Wound Measurements Length: (cm) Width: (cm) Depth: (cm) Area: (cm) Volume: (cm) 0 % Reduction in Area: 100% 0 % Reduction in Volume: 100% 0 Epithelialization: Large (67-100%) 0 Tunneling: No 0 Undermining: No Wound Description Classification: Full Thickness Without Exposed Support Wound Margin: Distinct, outline attached Exudate Amount: None Present Structures Foul Odor After Cleansing: No Slough/Fibrino Yes Wound Bed Granulation Amount: None Present (0%) Exposed Structure Necrotic Amount: Large (67-100%) Fascia Exposed: No Necrotic Quality: Eschar Fat Layer (Subcutaneous Tissue) Exposed: Yes Tendon Exposed: No Muscle Exposed: No Joint Exposed: No Bone Exposed: No Periwound Skin Texture Texture Color No Abnormalities Noted: Yes No Abnormalities Noted: No Hemosiderin Staining: Yes Moisture No Abnormalities Noted: Yes Temperature / Pain Temperature: No Abnormality Tenderness on Palpation: Yes Treatment Notes Wound #2 (Foot) Wound Laterality: Right, Medial Cleanser Peri-Wound Care Ryan [name], Ryan [name] ([id number]) [phone].pdf Page 8 of 8 Topical Primary Dressing Secondary Dressing Secured With Compression Wrap Compression Stockings Add-Ons Electronic Signature(s) Signed: 12/16/2023 12:47:24 PM By: Merleen Handing RN, BSN Entered By: Boehlein, Linda on 12/16/2023 09:52:52 -------------------------------------------------------------------------------- Vitals Details Patient Name: Date of Service: Ryan [name] 12/16/2023 9:15 A M Medical Record Number: [id number] Patient Account Number: [id number] Date of  Birth/Sex: Treating RN: [birth year] (63 y.o. NETTY Drury Nestle Primary Care Medha Pippen: Scarlett Shuck Other Clinician: Referring Creedence Heiss: Treating Cleto Claggett/Extender: Marolyn Delon Scarlett Shuck Devra in Treatment: 4 Vital Signs Time Taken: 09:31 Temperature (F): 97.6 Height (in): 69 Pulse (bpm): 67 Weight (lbs): 236 Respiratory Rate (breaths/min): 20 Body Mass Index (BMI): 34.8 Blood Pressure (mmHg): 152/89 Reference Range: 80 - 120 mg / dl Electronic Signature(s) Signed: 12/16/2023 12:56:04 PM By: Drury Nestle RN, BSN Entered By: Drury Nestle on 12/16/2023 09:31:50

## 2023-12-19 ENCOUNTER — Emergency Department (HOSPITAL_COMMUNITY): Payer: 59

## 2023-12-19 ENCOUNTER — Emergency Department (HOSPITAL_COMMUNITY)
Admission: EM | Admit: 2023-12-19 | Discharge: 2023-12-19 | Disposition: A | Discharge status: Home / Self Care | Payer: 59 | Attending: Emergency Medicine | Admitting: Emergency Medicine

## 2023-12-19 ENCOUNTER — Other Ambulatory Visit: Payer: Self-pay

## 2023-12-19 DIAGNOSIS — Z794 Long term (current) use of insulin: Secondary | ICD-10-CM | POA: Diagnosis not present

## 2023-12-19 DIAGNOSIS — R112 Nausea with vomiting, unspecified: Secondary | ICD-10-CM | POA: Insufficient documentation

## 2023-12-19 DIAGNOSIS — Z992 Dependence on renal dialysis: Secondary | ICD-10-CM | POA: Diagnosis not present

## 2023-12-19 DIAGNOSIS — Z7984 Long term (current) use of oral hypoglycemic drugs: Secondary | ICD-10-CM | POA: Diagnosis not present

## 2023-12-19 DIAGNOSIS — E875 Hyperkalemia: Secondary | ICD-10-CM | POA: Diagnosis not present

## 2023-12-19 DIAGNOSIS — K5730 Diverticulosis of large intestine without perforation or abscess without bleeding: Secondary | ICD-10-CM | POA: Diagnosis not present

## 2023-12-19 DIAGNOSIS — E119 Type 2 diabetes mellitus without complications: Secondary | ICD-10-CM | POA: Diagnosis not present

## 2023-12-19 DIAGNOSIS — Z7901 Long term (current) use of anticoagulants: Secondary | ICD-10-CM | POA: Diagnosis not present

## 2023-12-19 DIAGNOSIS — R1084 Generalized abdominal pain: Secondary | ICD-10-CM | POA: Diagnosis present

## 2023-12-19 DIAGNOSIS — K439 Ventral hernia without obstruction or gangrene: Secondary | ICD-10-CM | POA: Diagnosis not present

## 2023-12-19 DIAGNOSIS — K449 Diaphragmatic hernia without obstruction or gangrene: Secondary | ICD-10-CM | POA: Diagnosis not present

## 2023-12-19 DIAGNOSIS — R197 Diarrhea, unspecified: Secondary | ICD-10-CM | POA: Diagnosis not present

## 2023-12-19 LAB — COMPREHENSIVE METABOLIC PANEL
ALT: 21 U/L (ref 0–44)
AST: 33 U/L (ref 15–41)
Albumin: 3.8 g/dL (ref 3.5–5.0)
Alkaline Phosphatase: 103 U/L (ref 38–126)
Anion gap: 16 — ABNORMAL HIGH (ref 5–15)
BUN: 18 mg/dL (ref 6–20)
CO2: 16 mmol/L — ABNORMAL LOW (ref 22–32)
Calcium: 9.2 mg/dL (ref 8.9–10.3)
Chloride: 105 mmol/L (ref 98–111)
Creatinine, Ser: 1.72 mg/dL — ABNORMAL HIGH (ref 0.61–1.24)
GFR, Estimated: 45 mL/min — ABNORMAL LOW (ref >60)
Glucose, Bld: 199 mg/dL — ABNORMAL HIGH (ref 70–99)
Potassium: 3.4 mmol/L — ABNORMAL LOW (ref 3.5–5.1)
Sodium: 137 mmol/L (ref 135–145)
Total Bilirubin: 0.9 mg/dL (ref 0.0–1.2)
Total Protein: 8.2 g/dL — ABNORMAL HIGH (ref 6.5–8.1)

## 2023-12-19 LAB — CBC
HCT: 45.6 % (ref 39.0–52.0)
Hemoglobin: 14.1 g/dL (ref 13.0–17.0)
MCH: 25 pg — ABNORMAL LOW (ref 26.0–34.0)
MCHC: 30.9 g/dL (ref 30.0–36.0)
MCV: 80.9 fL (ref 80.0–100.0)
Platelets: 237 10*3/uL (ref 150–400)
RBC: 5.64 MIL/uL (ref 4.22–5.81)
RDW: 18.5 % — ABNORMAL HIGH (ref 11.5–15.5)
WBC: 5.8 10*3/uL (ref 4.0–10.5)
nRBC: 0 % (ref 0.0–0.2)

## 2023-12-19 LAB — URINALYSIS, ROUTINE W REFLEX MICROSCOPIC
Bilirubin Urine: NEGATIVE
Glucose, UA: NEGATIVE mg/dL
Hgb urine dipstick: NEGATIVE
Ketones, ur: NEGATIVE mg/dL
Leukocytes,Ua: NEGATIVE
Nitrite: NEGATIVE
Protein, ur: >=300 mg/dL — AB
Specific Gravity, Urine: 1.027 (ref 1.005–1.030)
pH: 5 (ref 5.0–8.0)

## 2023-12-19 LAB — LIPASE, BLOOD: Lipase: 28 U/L (ref 11–51)

## 2023-12-19 MED ORDER — IOHEXOL 350 MG/ML SOLN
60.0000 mL | Freq: Once | INTRAVENOUS | Status: AC | PRN
  Administered 2023-12-19: 60 mL via INTRAVENOUS

## 2023-12-19 MED ORDER — HYDROMORPHONE HCL 1 MG/ML IJ SOLN
0.5000 mg | Freq: Once | INTRAMUSCULAR | Status: AC
  Administered 2023-12-19: 0.5 mg via INTRAVENOUS
  Filled 2023-12-19: qty 1

## 2023-12-19 MED ORDER — ONDANSETRON 4 MG PO TBDP
4.0000 mg | ORAL_TABLET | Freq: Once | ORAL | Status: AC | PRN
  Administered 2023-12-19: 4 mg via ORAL
  Filled 2023-12-19: qty 1

## 2023-12-19 MED ORDER — SODIUM CHLORIDE 0.9 % IV BOLUS
1000.0000 mL | Freq: Once | INTRAVENOUS | Status: AC
  Administered 2023-12-19: 1000 mL via INTRAVENOUS

## 2023-12-19 MED ORDER — ONDANSETRON 4 MG PO TBDP: 4.0000 mg | ORAL_TABLET | Freq: Three times a day (TID) | ORAL | 0 refills | Status: DC | PRN

## 2023-12-19 NOTE — ED Provider Notes (Addendum)
 [place] EMERGENCY DEPARTMENT AT [facility] Provider Note   CSN: [id number] Arrival date & time: 12/19/23  1247     History  Chief Complaint  Patient presents with   Abdominal Pain    Ryan [name] is a 63 y.o. male.  Patient complains of nausea vomiting and diarrhea.  Patient reports that he has been having abdominal pain that is 9:00 last p.m.  Patient denies any fever or chills he does not recall being around anyone who has been sick.  Patient is diabetic he is on insulin .  Patient has a past medical history of A-fib with RVR.  Patient has chronic kidney disease.  Reports after he has diarrhea the pain resolves but he again is having cramping again before having more diarrhea.  Patient reports he has had limited fluid intake.  The history is provided by the patient. No language interpreter was used.  Abdominal Pain Pain location:  Generalized Pain quality: aching   Pain radiates to:  Does not radiate Associated symptoms: diarrhea, nausea and vomiting        Home Medications Prior to Admission medications   Medication Sig Start Date End Date Taking? Authorizing Provider  apixaban  (ELIQUIS ) 5 MG TABS tablet Take 1 tablet (5 mg total) by mouth 2 (two) times daily. 04/26/23 07/25/23  Shona Terry SAILOR, DO  diltiazem  (CARDIZEM  CD) 240 MG 24 hr capsule Take 1 capsule (240 mg total) by mouth daily. 04/26/23   Shona Terry SAILOR, DO  glipiZIDE (GLUCOTROL) 10 MG tablet Take 10 mg by mouth daily. 03/04/21   [provider]  insulin  NPH-regular Human (NOVOLIN  70/30) (70-30) 100 UNIT/ML injection Inject 35 units into the skin 2 times daily with a meal. Patient taking differently: Inject 45 Units into the skin 2 (two) times daily with a meal. 07/25/20   Goodrich, Callie E, PA-C  losartan  (COZAAR ) 50 MG tablet Take 1 tablet (50 mg total) by mouth daily. 04/26/23 12/19/23  Shona Terry SAILOR, DO  omeprazole (PRILOSEC) 40 MG capsule Take 40 mg by mouth daily. 01/07/21   [provider]  promethazine -dextromethorphan (PROMETHAZINE -DM) 6.25-15 MG/5ML syrup Take 5 mLs by mouth 4 (four) times daily as needed for cough. 04/11/23   Rising, Asberry, PA-C  ZYRTEC ALLERGY 10 MG tablet Take 10 mg by mouth in the morning. 03/03/20   [provider]      Allergies    Patient has no allergy information on record.    Review of Systems   Review of Systems  Gastrointestinal:  Positive for abdominal pain, diarrhea, nausea and vomiting.  All other systems reviewed and are negative.   Physical Exam Updated Vital Signs BP 129/74   Pulse 67   Temp 98 F (36.7 C) (Oral)   Resp 17   SpO2 97%  Physical Exam Vitals and nursing note reviewed.  Constitutional:      Appearance: He is well-developed.  HENT:     Head: Normocephalic.     Mouth/Throat:     Mouth: Mucous membranes are moist.  Eyes:     Extraocular Movements: Extraocular movements intact.  Cardiovascular:     Rate and Rhythm: Normal rate.     Heart sounds: Normal heart sounds.  Pulmonary:     Effort: Pulmonary effort is normal.  Abdominal:     General: Abdomen is flat. Bowel sounds are normal. There is no distension.     Palpations: Abdomen is soft.     Tenderness: There is abdominal tenderness.  Musculoskeletal:  General: Normal range of motion.     Cervical back: Normal range of motion.  Skin:    General: Skin is warm.  Neurological:     General: No focal deficit present.     Mental Status: He is alert and oriented to person, place, and time.  Psychiatric:        Mood and Affect: Mood normal.     ED Results / Procedures / Treatments   Labs (all labs ordered are listed, but only abnormal results are displayed) Labs Reviewed  COMPREHENSIVE METABOLIC PANEL - Abnormal; Notable for the following components:      Result Value   Potassium 3.4 (*)    CO2 16 (*)    Glucose, Bld 199 (*)    Creatinine, Ser 1.72 (*)    Total Protein 8.2 (*)    GFR, Estimated 45 (*)    Anion gap 16  (*)    All other components within normal limits  CBC - Abnormal; Notable for the following components:   MCH 25.0 (*)    RDW 18.5 (*)    All other components within normal limits  URINALYSIS, ROUTINE W REFLEX MICROSCOPIC - Abnormal; Notable for the following components:   Protein, ur >=300 (*)    Bacteria, UA RARE (*)    All other components within normal limits  LIPASE, BLOOD    EKG None  Radiology CT ABDOMEN PELVIS W CONTRAST Result Date: 12/19/2023 CLINICAL DATA:  Nonlocalized abdominal pain. Nausea and vomiting. Diarrhea since 9 p.m. last night. EXAM: CT ABDOMEN AND PELVIS WITH CONTRAST TECHNIQUE: Multidetector CT imaging of the abdomen and pelvis was performed using the standard protocol following bolus administration of intravenous contrast. RADIATION DOSE REDUCTION: This exam was performed according to the departmental dose-optimization program which includes automated exposure control, adjustment of the mA and/or kV according to patient size and/or use of iterative reconstruction technique. CONTRAST:  60mL OMNIPAQUE  IOHEXOL  350 MG/ML SOLN COMPARISON:  07/26/2021 FINDINGS: Lower chest: Dependent atelectasis and scarring in both lung bases. Mild cardiomegaly, without pericardial or pleural effusion. A moderate hiatal hernia. Distal esophageal wall thickening including on image 1. Hepatobiliary: Normal liver. Normal gallbladder, without biliary ductal dilatation. Pancreas: Fatty replaced pancreatic body and head. No duct dilatation or acute inflammation. Spleen: Normal in size, without focal abnormality. Adrenals/Urinary Tract: Normal adrenal glands. Normal kidneys, without hydronephrosis. Normal urinary bladder. Stomach/Bowel: Normal remainder of the stomach. Scattered colonic diverticula. Fluid-filled colon, likely related to the clinical history of diarrhea. Normal terminal ileum and appendix. Normal small bowel. Vascular/Lymphatic: Aortic atherosclerosis. No abdominopelvic adenopathy.  Reproductive: Normal prostate. Other: No significant free fluid. No free intraperitoneal air. Fat containing ventral pelvic wall hernia is minimally enlarged. Musculoskeletal: Thoracolumbar spondylosis. IMPRESSION: 1.  No acute process in the abdomen or pelvis. 2. Fluid-filled colon, likely related to the clinical history diarrhea. No evidence of bowel obstruction or perforation. 3. Moderate hiatal hernia with distal esophageal wall thickening. Correlate with symptoms of esophagitis. 4.  Aortic Atherosclerosis (ICD10-I70.0). Electronically Signed   By: Rockey Kilts M.D.   On: 12/19/2023 18:33    Procedures Procedures    Medications Ordered in ED Medications  ondansetron  (ZOFRAN -ODT) disintegrating tablet 4 mg (4 mg Oral Given 12/19/23 1312)  HYDROmorphone  (DILAUDID ) injection 0.5 mg (0.5 mg Intravenous Given 12/19/23 1659)  iohexol  (OMNIPAQUE ) 350 MG/ML injection 60 mL (60 mLs Intravenous Contrast Given 12/19/23 1756)    ED Course/ Medical Decision Making/ A&P  Medical Decision Making Patient complains of nausea vomiting and diarrhea.  Amount and/or Complexity of Data Reviewed Labs: ordered. Decision-making details documented in ED Course.    Details: Abs ordered reviewed and interpreted patient has a creatinine of 1.72.  GFR of 5 Radiology: ordered and independent interpretation performed. Decision-making details documented in ED Course.    Details: CT abdomen and pelvis with contrast show fluid-filled colon likely related to diarrhea.  Risk Prescription drug management. Risk Details: Patient given Zofran  IV pain medicine and IV fluids.  Patient reports feeling much better.  He is requesting to eat.  Patient counseled on viral illness.  He is given a prescription for Zofran  he is advised to follow-up with his primary care physician for recheck if symptoms persist return to the emergency department if any problems.  An After Visit Summary was printed and given  to the patient.          Final Clinical Impression(s) / ED Diagnoses Final diagnoses:  Hyperkalemia  Generalized abdominal pain  Dialysis patient [facility])    Rx / DC Orders ED Discharge Orders          Ordered    ondansetron  (ZOFRAN -ODT) 4 MG disintegrating tablet  Every 8 hours PRN        12/19/23 2137           An After Visit Summary was printed and given to the patient.    Flint Sonny POUR, PA-C 12/19/23 2004    Lilton Pare K, PA-C 12/19/23 2138    Yolande Lamar BROCKS, MD 12/23/23 [phone]

## 2023-12-19 NOTE — ED Notes (Signed)
 Discharge instructions reviewed with patient. Patient questions answered and opportunity for education reviewed. Patient voices understanding of discharge instructions with no further questions. Patient ambulatory with steady gait to lobby.

## 2023-12-19 NOTE — ED Triage Notes (Signed)
 Reports generalized abd pain, nausea, vomiting, and diarrhea since 9pm last night.

## 2023-12-23 ENCOUNTER — Encounter (HOSPITAL_BASED_OUTPATIENT_CLINIC_OR_DEPARTMENT_OTHER): Payer: 59 | Admitting: Internal Medicine

## 2024-04-02 DIAGNOSIS — D6859 Other primary thrombophilia: Secondary | ICD-10-CM | POA: Insufficient documentation

## 2024-04-02 DIAGNOSIS — I5032 Chronic diastolic (congestive) heart failure: Secondary | ICD-10-CM | POA: Insufficient documentation

## 2024-04-02 DIAGNOSIS — I482 Chronic atrial fibrillation, unspecified: Secondary | ICD-10-CM | POA: Insufficient documentation

## 2024-04-20 DIAGNOSIS — M1712 Unilateral primary osteoarthritis, left knee: Secondary | ICD-10-CM | POA: Insufficient documentation

## 2024-05-21 ENCOUNTER — Emergency Department (HOSPITAL_COMMUNITY)
Admission: EM | Admit: 2024-05-21 | Discharge: 2024-05-21 | Disposition: A | Discharge status: Home / Self Care | Attending: Emergency Medicine | Admitting: Emergency Medicine

## 2024-05-21 ENCOUNTER — Encounter (HOSPITAL_COMMUNITY): Payer: Self-pay

## 2024-05-21 DIAGNOSIS — Z7984 Long term (current) use of oral hypoglycemic drugs: Secondary | ICD-10-CM | POA: Diagnosis not present

## 2024-05-21 DIAGNOSIS — Z79899 Other long term (current) drug therapy: Secondary | ICD-10-CM | POA: Insufficient documentation

## 2024-05-21 DIAGNOSIS — Z7901 Long term (current) use of anticoagulants: Secondary | ICD-10-CM | POA: Diagnosis not present

## 2024-05-21 DIAGNOSIS — Z794 Long term (current) use of insulin: Secondary | ICD-10-CM | POA: Diagnosis not present

## 2024-05-21 DIAGNOSIS — I129 Hypertensive chronic kidney disease with stage 1 through stage 4 chronic kidney disease, or unspecified chronic kidney disease: Secondary | ICD-10-CM | POA: Insufficient documentation

## 2024-05-21 DIAGNOSIS — R1084 Generalized abdominal pain: Secondary | ICD-10-CM | POA: Diagnosis present

## 2024-05-21 DIAGNOSIS — E1122 Type 2 diabetes mellitus with diabetic chronic kidney disease: Secondary | ICD-10-CM | POA: Insufficient documentation

## 2024-05-21 DIAGNOSIS — N189 Chronic kidney disease, unspecified: Secondary | ICD-10-CM | POA: Insufficient documentation

## 2024-05-21 LAB — COMPREHENSIVE METABOLIC PANEL WITH GFR
ALT: 16 U/L (ref 0–44)
AST: 22 U/L (ref 15–41)
Albumin: 3.7 g/dL (ref 3.5–5.0)
Alkaline Phosphatase: 101 U/L (ref 38–126)
Anion gap: 11 (ref 5–15)
BUN: 11 mg/dL (ref 6–20)
CO2: 25 mmol/L (ref 22–32)
Calcium: 8.6 mg/dL — ABNORMAL LOW (ref 8.9–10.3)
Chloride: 100 mmol/L (ref 98–111)
Creatinine, Ser: 1.34 mg/dL — ABNORMAL HIGH (ref 0.61–1.24)
GFR, Estimated: >60 mL/min (ref >60)
Glucose, Bld: 175 mg/dL — ABNORMAL HIGH (ref 70–99)
Potassium: 3.3 mmol/L — ABNORMAL LOW (ref 3.5–5.1)
Sodium: 136 mmol/L (ref 135–145)
Total Bilirubin: 0.8 mg/dL (ref 0.0–1.2)
Total Protein: 8 g/dL (ref 6.5–8.1)

## 2024-05-21 LAB — CBC
HCT: 39.7 % (ref 39.0–52.0)
Hemoglobin: 12.3 g/dL — ABNORMAL LOW (ref 13.0–17.0)
MCH: 23.6 pg — ABNORMAL LOW (ref 26.0–34.0)
MCHC: 31 g/dL (ref 30.0–36.0)
MCV: 76.1 fL — ABNORMAL LOW (ref 80.0–100.0)
Platelets: 245 10*3/uL (ref 150–400)
RBC: 5.22 MIL/uL (ref 4.22–5.81)
RDW: 18.5 % — ABNORMAL HIGH (ref 11.5–15.5)
WBC: 6.8 10*3/uL (ref 4.0–10.5)
nRBC: 0 % (ref 0.0–0.2)

## 2024-05-21 LAB — URINALYSIS, ROUTINE W REFLEX MICROSCOPIC
Bacteria, UA: NONE SEEN
Bilirubin Urine: NEGATIVE
Glucose, UA: 50 mg/dL — AB
Hgb urine dipstick: NEGATIVE
Ketones, ur: NEGATIVE mg/dL
Leukocytes,Ua: NEGATIVE
Nitrite: NEGATIVE
Protein, ur: 100 mg/dL — AB
Specific Gravity, Urine: 1.019 (ref 1.005–1.030)
pH: 8 (ref 5.0–8.0)

## 2024-05-21 LAB — LIPASE, BLOOD: Lipase: 28 U/L (ref 11–51)

## 2024-05-21 MED ORDER — DICYCLOMINE HCL 20 MG PO TABS: 20.0000 mg | ORAL_TABLET | Freq: Two times a day (BID) | ORAL | 0 refills | Status: AC | PRN

## 2024-05-21 MED ORDER — ONDANSETRON HCL 4 MG/2ML IJ SOLN
4.0000 mg | Freq: Once | INTRAMUSCULAR | Status: AC | PRN
  Administered 2024-05-21: 4 mg via INTRAVENOUS
  Filled 2024-05-21: qty 2

## 2024-05-21 MED ORDER — DICYCLOMINE HCL 10 MG PO CAPS
10.0000 mg | ORAL_CAPSULE | Freq: Once | ORAL | Status: AC
  Administered 2024-05-21: 10 mg via ORAL
  Filled 2024-05-21: qty 1

## 2024-05-21 MED ORDER — PANTOPRAZOLE SODIUM 40 MG PO TBEC: 40.0000 mg | DELAYED_RELEASE_TABLET | Freq: Every day | ORAL | 0 refills | Status: AC

## 2024-05-21 MED ORDER — FENTANYL CITRATE PF 50 MCG/ML IJ SOSY
50.0000 ug | PREFILLED_SYRINGE | INTRAMUSCULAR | Status: DC | PRN
  Administered 2024-05-21: 50 ug via INTRAVENOUS
  Filled 2024-05-21: qty 1

## 2024-05-21 MED ORDER — ONDANSETRON 8 MG PO TBDP: 8.0000 mg | ORAL_TABLET | Freq: Three times a day (TID) | ORAL | 0 refills | Status: AC | PRN

## 2024-05-21 MED ORDER — KETOROLAC TROMETHAMINE 15 MG/ML IJ SOLN
15.0000 mg | Freq: Once | INTRAMUSCULAR | Status: AC
  Administered 2024-05-21: 15 mg via INTRAVENOUS
  Filled 2024-05-21: qty 1

## 2024-05-21 MED ORDER — SODIUM CHLORIDE 0.9 % IV BOLUS
1000.0000 mL | Freq: Once | INTRAVENOUS | Status: AC
  Administered 2024-05-21: 1000 mL via INTRAVENOUS

## 2024-05-21 NOTE — ED Triage Notes (Signed)
 Patient reports abdominal pain that started at 9pm last night. Denies nausea, vomiting, or diarrhea. Denies blood in stool. No concerns with urination. Patient thinks it is related to something he ate. Ate a hamburger from his freezer. Patient took ibuprofen  2200, with no relief.

## 2024-05-21 NOTE — Discharge Instructions (Signed)
 Take the medications to help with your stomach cramping.  Return to the ER if started and fever vomiting recurrent symptoms.

## 2024-05-21 NOTE — ED Provider Notes (Signed)
 [place] EMERGENCY DEPARTMENT AT [facility] Provider Note   CSN: [id number] Arrival date & time: 05/21/24  2130     History  Chief Complaint  Patient presents with   Abdominal Pain    Ryan [name] is a 63 y.o. male.   Abdominal Pain    Patient has a history of chest pain chronic kidney disease acid reflux hypertension diabetes atrial flutter.  Patient states he started having abdominal pain last evening around 9 PM.  Patient states he thinks it was related to the food he ate.  He ate a hamburger.  No one else ate the same food.  Patient states he started having diffuse abdominal pain primarily around the middle of his abdomen.  He denies any vomiting.  He denies any diarrhea.  He denies any urinary symptoms.  Patient denies fevers or chills.  No chest pain or shortness of breath.  Patient has never had any prior abdominal surgeries  Home Medications Prior to Admission medications   Medication Sig Start Date End Date Taking? Authorizing Provider  dicyclomine (BENTYL) 20 MG tablet Take 1 tablet (20 mg total) by mouth 2 (two) times daily as needed for spasms. 05/21/24  Yes Trish Furl, MD  ondansetron  (ZOFRAN -ODT) 8 MG disintegrating tablet Take 1 tablet (8 mg total) by mouth every 8 (eight) hours as needed for nausea or vomiting. 05/21/24  Yes Trish Furl, MD  pantoprazole  (PROTONIX ) 40 MG tablet Take 1 tablet (40 mg total) by mouth daily. 05/21/24  Yes Trish Furl, MD  apixaban  (ELIQUIS ) 5 MG TABS tablet Take 1 tablet (5 mg total) by mouth 2 (two) times daily. 04/26/23 07/25/23  Bary Boss, DO  diltiazem  (CARDIZEM  CD) 240 MG 24 hr capsule Take 1 capsule (240 mg total) by mouth daily. 04/26/23   Bary Boss, DO  glipiZIDE (GLUCOTROL) 10 MG tablet Take 10 mg by mouth daily. 03/04/21   [provider]  insulin  NPH-regular Human (NOVOLIN  70/30) (70-30) 100 UNIT/ML injection Inject 35 units into the skin 2 times daily with a meal. Patient taking differently:  Inject 45 Units into the skin 2 (two) times daily with a meal. 07/25/20   Goodrich, Callie E, PA-C  losartan  (COZAAR ) 50 MG tablet Take 1 tablet (50 mg total) by mouth daily. 04/26/23 12/19/23  Bary Boss, DO  promethazine -dextromethorphan (PROMETHAZINE -DM) 6.25-15 MG/5ML syrup Take 5 mLs by mouth 4 (four) times daily as needed for cough. 04/11/23   Rising, Ivette Marks, PA-C  ZYRTEC ALLERGY 10 MG tablet Take 10 mg by mouth in the morning. 03/03/20   [provider]      Allergies    Patient has no known allergies.    Review of Systems   Review of Systems  Gastrointestinal:  Positive for abdominal pain.    Physical Exam Updated Vital Signs BP (!) 148/115 (BP Location: Left Arm)   Pulse 91   Temp 98.1 F (36.7 C) (Oral)   Resp 15   Ht 1.753 m (5\' 9" )   Wt 104.3 kg   SpO2 100%   BMI 33.97 kg/m  Physical Exam Vitals and nursing note reviewed.  Constitutional:      General: He is not in acute distress.    Appearance: He is well-developed.  HENT:     Head: Normocephalic and atraumatic.     Right Ear: External ear normal.     Left Ear: External ear normal.  Eyes:     General: No scleral icterus.  Right eye: No discharge.        Left eye: No discharge.     Conjunctiva/sclera: Conjunctivae normal.  Neck:     Trachea: No tracheal deviation.  Cardiovascular:     Rate and Rhythm: Normal rate and regular rhythm.  Pulmonary:     Effort: Pulmonary effort is normal. No respiratory distress.     Breath sounds: Normal breath sounds. No stridor. No wheezing or rales.  Abdominal:     General: Bowel sounds are normal. There is no distension.     Palpations: Abdomen is soft.     Tenderness: There is no abdominal tenderness. There is no guarding or rebound.  Musculoskeletal:        General: No tenderness or deformity.     Cervical back: Neck supple.  Skin:    General: Skin is warm and dry.     Findings: No rash.  Neurological:     General: No focal deficit present.      Mental Status: He is alert.     Cranial Nerves: No cranial nerve deficit, dysarthria or facial asymmetry.     Sensory: No sensory deficit.     Motor: No abnormal muscle tone or seizure activity.     Coordination: Coordination normal.  Psychiatric:        Mood and Affect: Mood normal.     ED Results / Procedures / Treatments   Labs (all labs ordered are listed, but only abnormal results are displayed) Labs Reviewed  COMPREHENSIVE METABOLIC PANEL WITH GFR - Abnormal; Notable for the following components:      Result Value   Potassium 3.3 (*)    Glucose, Bld 175 (*)    Creatinine, Ser 1.34 (*)    Calcium 8.6 (*)    All other components within normal limits  CBC - Abnormal; Notable for the following components:   Hemoglobin 12.3 (*)    MCV 76.1 (*)    MCH 23.6 (*)    RDW 18.5 (*)    All other components within normal limits  URINALYSIS, ROUTINE W REFLEX MICROSCOPIC - Abnormal; Notable for the following components:   Glucose, UA 50 (*)    Protein, ur 100 (*)    All other components within normal limits  LIPASE, BLOOD    EKG None  Radiology No results found.  Procedures Procedures    Medications Ordered in ED Medications  ondansetron  (ZOFRAN ) injection 4 mg (4 mg Intravenous Given 05/21/24 0641)  sodium chloride  0.9 % bolus 1,000 mL (1,000 mLs Intravenous New Bag/Given 05/21/24 0750)  ketorolac  (TORADOL ) 15 MG/ML injection 15 mg (15 mg Intravenous Given 05/21/24 0751)  dicyclomine (BENTYL) capsule 10 mg (10 mg Oral Given 05/21/24 0751)    ED Course/ Medical Decision Making/ A&P Clinical Course as of 05/21/24 0924  Mon May 21, 2024  0724 Patient had a CT abdomen pelvis earlier this year in January.  No acute abnormalities were noted [JK]  0742 EKG shows atrial fibrillation with a rate of 82.  Nonspecific T wave changes lateral leads.  No significant change compared to prior tracing [JK]  0848 CBC metabolic panel lipase normal. [JK]    Clinical Course User Index [JK]  Trish Furl, MD                                 Medical Decision Making Problems Addressed: Generalized abdominal pain: acute illness or injury that poses a threat to life  or bodily functions  Amount and/or Complexity of Data Reviewed Labs: ordered. Decision-making details documented in ED Course.  Risk Prescription drug management.   Patient presents to the ED for evaluation of abdominal pain.  Patient's ED workup is reassuring.  Patient does not have any abdominal tenderness on exam.  His labs do not show any signs of hepatitis or pancreatitis.  No leukocytosis.  Hemoglobin is similar compared to previous labs.  No signs of acute blood loss.  The possible symptoms were related to the food that disagreed with him.  Will give him antacids and antinausea medication as well.  Also Bentyl as needed.  Evaluation and diagnostic testing in the emergency department does not suggest an emergent condition requiring admission or immediate intervention beyond what has been performed at this time.  The patient is safe for discharge and has been instructed to return immediately for worsening symptoms, change in symptoms or any other concerns.        Final Clinical Impression(s) / ED Diagnoses Final diagnoses:  Generalized abdominal pain    Rx / DC Orders ED Discharge Orders          Ordered    pantoprazole  (PROTONIX ) 40 MG tablet  Daily        05/21/24 0923    dicyclomine (BENTYL) 20 MG tablet  2 times daily PRN        05/21/24 0923    ondansetron  (ZOFRAN -ODT) 8 MG disintegrating tablet  Every 8 hours PRN        05/21/24 9147              Trish Furl, MD 05/21/24 [phone]

## 2024-05-22 ENCOUNTER — Telehealth (HOSPITAL_COMMUNITY): Payer: Self-pay | Admitting: Pharmacy Technician

## 2024-05-22 ENCOUNTER — Encounter (HOSPITAL_COMMUNITY): Payer: Self-pay

## 2024-05-22 ENCOUNTER — Other Ambulatory Visit: Payer: Self-pay

## 2024-05-22 ENCOUNTER — Emergency Department (HOSPITAL_COMMUNITY)

## 2024-05-22 ENCOUNTER — Emergency Department (HOSPITAL_COMMUNITY): Admission: EM | Admit: 2024-05-22 | Discharge: 2024-05-22 | Disposition: A | Discharge status: Home / Self Care

## 2024-05-22 ENCOUNTER — Other Ambulatory Visit (HOSPITAL_COMMUNITY): Payer: Self-pay

## 2024-05-22 DIAGNOSIS — I129 Hypertensive chronic kidney disease with stage 1 through stage 4 chronic kidney disease, or unspecified chronic kidney disease: Secondary | ICD-10-CM | POA: Insufficient documentation

## 2024-05-22 DIAGNOSIS — N280 Ischemia and infarction of kidney: Secondary | ICD-10-CM | POA: Insufficient documentation

## 2024-05-22 DIAGNOSIS — Z794 Long term (current) use of insulin: Secondary | ICD-10-CM | POA: Insufficient documentation

## 2024-05-22 DIAGNOSIS — N189 Chronic kidney disease, unspecified: Secondary | ICD-10-CM | POA: Insufficient documentation

## 2024-05-22 DIAGNOSIS — D735 Infarction of spleen: Secondary | ICD-10-CM | POA: Diagnosis not present

## 2024-05-22 DIAGNOSIS — Z7984 Long term (current) use of oral hypoglycemic drugs: Secondary | ICD-10-CM | POA: Diagnosis not present

## 2024-05-22 DIAGNOSIS — E119 Type 2 diabetes mellitus without complications: Secondary | ICD-10-CM | POA: Diagnosis not present

## 2024-05-22 DIAGNOSIS — Z7901 Long term (current) use of anticoagulants: Secondary | ICD-10-CM | POA: Insufficient documentation

## 2024-05-22 DIAGNOSIS — R1030 Lower abdominal pain, unspecified: Secondary | ICD-10-CM | POA: Diagnosis present

## 2024-05-22 DIAGNOSIS — Z79899 Other long term (current) drug therapy: Secondary | ICD-10-CM | POA: Insufficient documentation

## 2024-05-22 DIAGNOSIS — I4891 Unspecified atrial fibrillation: Secondary | ICD-10-CM | POA: Insufficient documentation

## 2024-05-22 LAB — COMPREHENSIVE METABOLIC PANEL WITH GFR
ALT: 18 U/L (ref 0–44)
AST: 25 U/L (ref 15–41)
Albumin: 3.4 g/dL — ABNORMAL LOW (ref 3.5–5.0)
Alkaline Phosphatase: 109 U/L (ref 38–126)
Anion gap: 11 (ref 5–15)
BUN: 9 mg/dL (ref 6–20)
CO2: 23 mmol/L (ref 22–32)
Calcium: 8.8 mg/dL — ABNORMAL LOW (ref 8.9–10.3)
Chloride: 102 mmol/L (ref 98–111)
Creatinine, Ser: 1.52 mg/dL — ABNORMAL HIGH (ref 0.61–1.24)
GFR, Estimated: 52 mL/min — ABNORMAL LOW (ref >60)
Glucose, Bld: 155 mg/dL — ABNORMAL HIGH (ref 70–99)
Potassium: 3.5 mmol/L (ref 3.5–5.1)
Sodium: 136 mmol/L (ref 135–145)
Total Bilirubin: 0.9 mg/dL (ref 0.0–1.2)
Total Protein: 7.5 g/dL (ref 6.5–8.1)

## 2024-05-22 LAB — CBC WITH DIFFERENTIAL/PLATELET
Abs Immature Granulocytes: 0.02 10*3/uL (ref 0.00–0.07)
Basophils Absolute: 0.1 10*3/uL (ref 0.0–0.1)
Basophils Relative: 1 %
Eosinophils Absolute: 0.1 10*3/uL (ref 0.0–0.5)
Eosinophils Relative: 1 %
HCT: 41.1 % (ref 39.0–52.0)
Hemoglobin: 12.5 g/dL — ABNORMAL LOW (ref 13.0–17.0)
Immature Granulocytes: 0 %
Lymphocytes Relative: 25 %
Lymphs Abs: 2.3 10*3/uL (ref 0.7–4.0)
MCH: 23.1 pg — ABNORMAL LOW (ref 26.0–34.0)
MCHC: 30.4 g/dL (ref 30.0–36.0)
MCV: 76 fL — ABNORMAL LOW (ref 80.0–100.0)
Monocytes Absolute: 1.3 10*3/uL — ABNORMAL HIGH (ref 0.1–1.0)
Monocytes Relative: 15 %
Neutro Abs: 5.4 10*3/uL (ref 1.7–7.7)
Neutrophils Relative %: 58 %
Platelets: 273 10*3/uL (ref 150–400)
RBC: 5.41 MIL/uL (ref 4.22–5.81)
RDW: 18.2 % — ABNORMAL HIGH (ref 11.5–15.5)
WBC: 9.2 10*3/uL (ref 4.0–10.5)
nRBC: 0 % (ref 0.0–0.2)

## 2024-05-22 LAB — URINALYSIS, ROUTINE W REFLEX MICROSCOPIC
Bacteria, UA: NONE SEEN
Bilirubin Urine: NEGATIVE
Glucose, UA: NEGATIVE mg/dL
Hgb urine dipstick: NEGATIVE
Ketones, ur: NEGATIVE mg/dL
Leukocytes,Ua: NEGATIVE
Nitrite: NEGATIVE
Protein, ur: 30 mg/dL — AB
Specific Gravity, Urine: 1.021 (ref 1.005–1.030)
pH: 7 (ref 5.0–8.0)

## 2024-05-22 LAB — LIPASE, BLOOD: Lipase: 27 U/L (ref 11–51)

## 2024-05-22 MED ORDER — LABETALOL HCL 5 MG/ML IV SOLN: 10.0000 mg | Freq: Once | INTRAVENOUS | Status: DC

## 2024-05-22 MED ORDER — APIXABAN 5 MG PO TABS: 5.0000 mg | ORAL_TABLET | Freq: Two times a day (BID) | ORAL | 0 refills | Status: AC

## 2024-05-22 MED ORDER — TRAMADOL HCL 50 MG PO TABS: 50.0000 mg | ORAL_TABLET | Freq: Four times a day (QID) | ORAL | 0 refills | Status: AC | PRN

## 2024-05-22 MED ORDER — DILTIAZEM HCL ER COATED BEADS 240 MG PO CP24: 240.0000 mg | ORAL_CAPSULE | Freq: Every day | ORAL | 2 refills | Status: AC

## 2024-05-22 MED ORDER — APIXABAN 5 MG PO TABS
5.0000 mg | ORAL_TABLET | Freq: Two times a day (BID) | ORAL | Status: DC
  Administered 2024-05-22: 5 mg via ORAL
  Filled 2024-05-22: qty 1

## 2024-05-22 MED ORDER — IOHEXOL 350 MG/ML SOLN
75.0000 mL | Freq: Once | INTRAVENOUS | Status: AC | PRN
  Administered 2024-05-22: 75 mL via INTRAVENOUS

## 2024-05-22 MED ORDER — HYOSCYAMINE SULFATE 0.125 MG SL SUBL
0.2500 mg | SUBLINGUAL_TABLET | Freq: Once | SUBLINGUAL | Status: AC
  Administered 2024-05-22: 0.25 mg via SUBLINGUAL
  Filled 2024-05-22: qty 2

## 2024-05-22 MED ORDER — LACTATED RINGERS IV BOLUS
1000.0000 mL | Freq: Once | INTRAVENOUS | Status: AC
  Administered 2024-05-22: 1000 mL via INTRAVENOUS

## 2024-05-22 MED ORDER — OXYCODONE-ACETAMINOPHEN 5-325 MG PO TABS
1.0000 | ORAL_TABLET | Freq: Once | ORAL | Status: AC
  Administered 2024-05-22: 1 via ORAL
  Filled 2024-05-22: qty 1

## 2024-05-22 MED ORDER — DILTIAZEM HCL ER COATED BEADS 240 MG PO CP24
240.0000 mg | ORAL_CAPSULE | Freq: Once | ORAL | Status: AC
  Administered 2024-05-22: 240 mg via ORAL
  Filled 2024-05-22: qty 1

## 2024-05-22 MED ORDER — ALUM & MAG HYDROXIDE-SIMETH 200-200-20 MG/5ML PO SUSP
30.0000 mL | Freq: Once | ORAL | Status: AC
  Administered 2024-05-22: 30 mL via ORAL
  Filled 2024-05-22: qty 30

## 2024-05-22 NOTE — Telephone Encounter (Signed)
Patient Product/process development scientist completed.    The patient is insured through [facility].     Ran test claim for Eliquis 5 mg and the current 30 day co-pay is $4.00.   This test claim was processed through [facility]- copay amounts may vary at other pharmacies due to pharmacy/plan contracts, or as the patient moves through the different stages of their insurance plan.     Roland Earl, CPHT Pharmacy Technician III Certified Patient Advocate [facility] Pharmacy Patient Advocate Team Direct Number: [phone]  Fax: [phone]

## 2024-05-22 NOTE — ED Notes (Signed)
Patient aware of need for urine specimen.

## 2024-05-22 NOTE — ED Triage Notes (Signed)
 Pt. Stated, Im still having stomach pain. I went to [facility] yesterday and they did nothing. Last bowel movement was last Friday. Denies any other symptoms.

## 2024-05-22 NOTE — ED Notes (Signed)
 Per EDP patient allowed to eat. Sandwich tray and water given

## 2024-05-22 NOTE — ED Provider Notes (Signed)
 [place] EMERGENCY DEPARTMENT AT [facility] Provider Note   CSN: [id number] Arrival date & time: 05/22/24  1308     History  Chief Complaint  Patient presents with   Abdominal Pain    Destan Franchini is a 63 y.o. male.  63 year old male with past medical history of diabetes, hypertension, GERD, chronic kidney disease presenting to the emergency department today with abdominal pain.  Patient states that this been going on since Sunday.  Reports that has been intermittent.  He has not had any nausea or vomiting.  Does report that she started having some chills after going home last night.  He was evaluated yesterday in the emergency department and labs were reassuring.  He states that when he got home his symptoms have persisted which is why he came to the ER for further evaluation.  He denies any loose stools.  States that he has not had a bowel movement since Sunday.  He reports that the discomfort is mainly in his lower abdomen.   Abdominal Pain      Home Medications Prior to Admission medications   Medication Sig Start Date End Date Taking? Authorizing Provider  traMADol  (ULTRAM ) 50 MG tablet Take 1 tablet (50 mg total) by mouth every 6 (six) hours as needed. 05/22/24  Yes Carin Charleston, MD  apixaban  (ELIQUIS ) 5 MG TABS tablet Take 1 tablet (5 mg total) by mouth 2 (two) times daily. 05/22/24 08/20/24  Carin Charleston, MD  dicyclomine (BENTYL) 20 MG tablet Take 1 tablet (20 mg total) by mouth 2 (two) times daily as needed for spasms. 05/21/24   Trish Furl, MD  diltiazem  (CARDIZEM  CD) 240 MG 24 hr capsule Take 1 capsule (240 mg total) by mouth daily. 05/22/24   Carin Charleston, MD  glipiZIDE (GLUCOTROL) 10 MG tablet Take 10 mg by mouth daily. 03/04/21   [provider]  insulin  NPH-regular Human (NOVOLIN  70/30) (70-30) 100 UNIT/ML injection Inject 35 units into the skin 2 times daily with a meal. Patient taking differently: Inject 45 Units into the skin 2 (two)  times daily with a meal. 07/25/20   Goodrich, Callie E, PA-C  losartan  (COZAAR ) 50 MG tablet Take 1 tablet (50 mg total) by mouth daily. 04/26/23 12/19/23  Bary Boss, DO  ondansetron  (ZOFRAN -ODT) 8 MG disintegrating tablet Take 1 tablet (8 mg total) by mouth every 8 (eight) hours as needed for nausea or vomiting. 05/21/24   Trish Furl, MD  pantoprazole  (PROTONIX ) 40 MG tablet Take 1 tablet (40 mg total) by mouth daily. 05/21/24   Trish Furl, MD  promethazine -dextromethorphan (PROMETHAZINE -DM) 6.25-15 MG/5ML syrup Take 5 mLs by mouth 4 (four) times daily as needed for cough. 04/11/23   Rising, Ivette Marks, PA-C  ZYRTEC ALLERGY 10 MG tablet Take 10 mg by mouth in the morning. 03/03/20   [provider]      Allergies    Patient has no known allergies.    Review of Systems   Review of Systems  Gastrointestinal:  Positive for abdominal pain.  All other systems reviewed and are negative.   Physical Exam Updated Vital Signs BP (!) 144/97   Pulse (!) 106   Temp 98.1 F (36.7 C) (Oral)   Resp 16   Ht 5\' 9"  (1.753 m)   Wt 104.3 kg   SpO2 96%   BMI 33.97 kg/m  Physical Exam Vitals and nursing note reviewed.   Gen: NAD Eyes: PERRL, EOMI HEENT: no oropharyngeal swelling Neck: trachea  midline Resp: clear to auscultation bilaterally Card: RRR, no murmurs, rubs, or gallops Abd: Mildly distended, the patient reports some mild tenderness over the right and left lower quadrants without guarding or rebound Extremities: no calf tenderness, no edema Vascular: 2+ radial pulses bilaterally, 2+ DP pulses bilaterally Skin: no rashes Psyc: acting appropriately   ED Results / Procedures / Treatments   Labs (all labs ordered are listed, but only abnormal results are displayed) Labs Reviewed  CBC WITH DIFFERENTIAL/PLATELET - Abnormal; Notable for the following components:      Result Value   Hemoglobin 12.5 (*)    MCV 76.0 (*)    MCH 23.1 (*)    RDW 18.2 (*)    Monocytes Absolute 1.3 (*)     All other components within normal limits  COMPREHENSIVE METABOLIC PANEL WITH GFR - Abnormal; Notable for the following components:   Glucose, Bld 155 (*)    Creatinine, Ser 1.52 (*)    Calcium 8.8 (*)    Albumin 3.4 (*)    GFR, Estimated 52 (*)    All other components within normal limits  URINALYSIS, ROUTINE W REFLEX MICROSCOPIC - Abnormal; Notable for the following components:   Protein, ur 30 (*)    All other components within normal limits  LIPASE, BLOOD    EKG EKG Interpretation Date/Time:  Tuesday May 22 2024 12:15:56 EDT Ventricular Rate:  91 PR Interval:    QRS Duration:  79 QT Interval:  361 QTC Calculation: 445 R Axis:   130  Text Interpretation: Atrial fibrillation ST-T changes similar in morphology to EKG from 05/24/23 Confirmed by Abner Hoffman [phone]) on 05/22/2024 12:28:20 PM  Radiology CT ABDOMEN PELVIS W CONTRAST Result Date: 05/22/2024 CLINICAL DATA:  Abdominal pain, acute nonlocalized. EXAM: CT ABDOMEN AND PELVIS WITH CONTRAST TECHNIQUE: Multidetector CT imaging of the abdomen and pelvis was performed using the standard protocol following bolus administration of intravenous contrast. RADIATION DOSE REDUCTION: This exam was performed according to the departmental dose-optimization program which includes automated exposure control, adjustment of the mA and/or kV according to patient size and/or use of iterative reconstruction technique. CONTRAST:  75mL OMNIPAQUE  IOHEXOL  350 MG/ML SOLN COMPARISON:  CT 12/19/2023 FINDINGS: Lower chest: Lung bases are clear. Hepatobiliary: No focal hepatic lesion. Normal gallbladder. No biliary duct dilatation. Common bile duct is normal. Pancreas: Pancreas is normal. No ductal dilatation. No pancreatic inflammation. Spleen: There is a new band hypo perfusion centrally within the spleen. This pattern is typical of a splenic infarction. Infarction is relatively small (approximately 10% splenic volume). Adrenals/urinary tract: Adrenal glands  are normal. There is new focus of hypoperfusion within the anterior cortex of the mid RIGHT kidney measuring 2.2 cm. (Image 36/3). This pattern is typical of a renal infarction. LEFT kidney is normal.  Ureters and bladder normal. Stomach/Bowel: Moderate size hiatal hernia. Small bowel is normal. Appendix normal. The colon and rectosigmoid colon are normal. Vascular/Lymphatic: Minimal atherosclerotic calcification of the abdominal aorta. Reproductive: Prostate unremarkable Other: Small fat filled umbilical hernia. Musculoskeletal: No aggressive osseous lesion. IMPRESSION: 1. New splenic and RIGHT renal infarctions. Finding consistent with embolic infarctions. 2. Moderate size hiatal hernia. Electronically Signed   By: Deboraha Fallow M.D.   On: 05/22/2024 10:34    Procedures Procedures    Medications Ordered in ED Medications  apixaban  (ELIQUIS ) tablet 5 mg (5 mg Oral Given 05/22/24 1147)  hyoscyamine (LEVSIN SL) SL tablet 0.25 mg (0.25 mg Sublingual Given 05/22/24 0942)  lactated ringers  bolus 1,000 mL (0 mLs Intravenous Stopped  05/22/24 1150)  iohexol  (OMNIPAQUE ) 350 MG/ML injection 75 mL (75 mLs Intravenous Contrast Given 05/22/24 1015)  oxyCODONE -acetaminophen  (PERCOCET/ROXICET) 5-325 MG per tablet 1 tablet (1 tablet Oral Given 05/22/24 1330)  alum & mag hydroxide-simeth (MAALOX/MYLANTA) 200-200-20 MG/5ML suspension 30 mL (30 mLs Oral Given 05/22/24 1330)  diltiazem  (CARDIZEM  CD) 24 hr capsule 240 mg (240 mg Oral Given 05/22/24 1347)    ED Course/ Medical Decision Making/ A&P                                 Medical Decision Making 63 year old male with past medical history of diabetes, hypertension, and GERD presenting to the emergency department today with abdominal pain.  I will further evaluate the patient here with basic labs including LFTs and a lipase to evaluate for hepatobiliary otology or pancreatitis.  His abdominal exam is relatively unremarkable but given his persistent symptoms we  will obtain a CT scan to evaluate for appendicitis or diverticulitis.  I will give the patient a GI cocktail here as well as Levsin for his symptoms and reevaluate for ultimate disposition.  Also give some IV fluids.  The patient's labs are largely unremarkable.  His CT scan did show a splenic infarct as well as a renal infarct.  His creatinine does look to be close to at his baseline.  I did call and discussed his case with Dr. Alroy Aspen from cardiology to see if any imaging such as a Kathren Scearce or TTE would be warranted given the 2 infarcts but after speaking with the patient he has been noncompliant with his medications.  After discussion with Dr. Alroy Aspen he recommends starting the patient back on his Eliquis  and having him follow-up as an outpatient.  The patient has his pain controlled after 1 dose of medications here.  Will discharge him with some pain medications for the next few days and he will be discharged with return precautions.  The patient's heart rate did improved to the 90s on reassessment.  His symptoms have improved.  He will be discharged with return precautions.  Amount and/or Complexity of Data Reviewed Labs: ordered. Radiology: ordered.  Risk OTC drugs. Prescription drug management.           Final Clinical Impression(s) / ED Diagnoses Final diagnoses:  Splenic infarct  Renal infarction [facility])  Atrial fibrillation, unspecified type (HCC)    Rx / DC Orders ED Discharge Orders          Ordered    apixaban  (ELIQUIS ) 5 MG TABS tablet  2 times daily        05/22/24 1438    diltiazem  (CARDIZEM  CD) 240 MG 24 hr capsule  Daily        05/22/24 1438    traMADol  (ULTRAM ) 50 MG tablet  Every 6 hours PRN        05/22/24 1440              Carin Charleston, MD 05/22/24 1441

## 2024-05-22 NOTE — Discharge Instructions (Addendum)
 It looks like you have had infarctions to your spleen and kidneys from not taking the blood thinner medicine.  It is very important to take this as prescribed.  Please take the Cardizem  which helps with your heart rate as well.  Be sure to follow-up with your doctor for reevaluation and return to the emergency department for worsening symptoms.  Please take the tramadol  as needed for pain.  Do not drive or drink alcohol while taking this as it may make you drowsy.

## 2024-05-25 ENCOUNTER — Other Ambulatory Visit: Payer: Self-pay

## 2024-05-25 ENCOUNTER — Encounter (HOSPITAL_COMMUNITY): Payer: Self-pay

## 2024-05-25 ENCOUNTER — Emergency Department (HOSPITAL_COMMUNITY): Admission: EM | Admit: 2024-05-25 | Discharge: 2024-05-25 | Disposition: A | Discharge status: Home / Self Care

## 2024-05-25 ENCOUNTER — Emergency Department (HOSPITAL_COMMUNITY)

## 2024-05-25 DIAGNOSIS — Z794 Long term (current) use of insulin: Secondary | ICD-10-CM | POA: Diagnosis not present

## 2024-05-25 DIAGNOSIS — G8929 Other chronic pain: Secondary | ICD-10-CM | POA: Insufficient documentation

## 2024-05-25 DIAGNOSIS — Z7901 Long term (current) use of anticoagulants: Secondary | ICD-10-CM | POA: Diagnosis not present

## 2024-05-25 DIAGNOSIS — M25512 Pain in left shoulder: Secondary | ICD-10-CM | POA: Insufficient documentation

## 2024-05-25 LAB — TROPONIN I (HIGH SENSITIVITY): Troponin I (High Sensitivity): 10 ng/L (ref <18)

## 2024-05-25 LAB — CBC
HCT: 33.6 % — ABNORMAL LOW (ref 39.0–52.0)
Hemoglobin: 10.5 g/dL — ABNORMAL LOW (ref 13.0–17.0)
MCH: 23.4 pg — ABNORMAL LOW (ref 26.0–34.0)
MCHC: 31.3 g/dL (ref 30.0–36.0)
MCV: 74.8 fL — ABNORMAL LOW (ref 80.0–100.0)
Platelets: 267 10*3/uL (ref 150–400)
RBC: 4.49 MIL/uL (ref 4.22–5.81)
RDW: 18 % — ABNORMAL HIGH (ref 11.5–15.5)
WBC: 7.6 10*3/uL (ref 4.0–10.5)
nRBC: 0 % (ref 0.0–0.2)

## 2024-05-25 LAB — BASIC METABOLIC PANEL WITH GFR
Anion gap: 14 (ref 5–15)
BUN: 16 mg/dL (ref 6–20)
CO2: 17 mmol/L — ABNORMAL LOW (ref 22–32)
Calcium: 8.6 mg/dL — ABNORMAL LOW (ref 8.9–10.3)
Chloride: 103 mmol/L (ref 98–111)
Creatinine, Ser: 1.75 mg/dL — ABNORMAL HIGH (ref 0.61–1.24)
GFR, Estimated: 44 mL/min — ABNORMAL LOW (ref >60)
Glucose, Bld: 78 mg/dL (ref 70–99)
Potassium: 3.2 mmol/L — ABNORMAL LOW (ref 3.5–5.1)
Sodium: 134 mmol/L — ABNORMAL LOW (ref 135–145)

## 2024-05-25 MED ORDER — DICLOFENAC SODIUM 1 % EX GEL: 2.0000 g | Freq: Four times a day (QID) | CUTANEOUS | Status: AC

## 2024-05-25 MED ORDER — OXYCODONE-ACETAMINOPHEN 5-325 MG PO TABS
1.0000 | ORAL_TABLET | Freq: Once | ORAL | Status: AC
  Administered 2024-05-25: 1 via ORAL
  Filled 2024-05-25: qty 1

## 2024-05-25 NOTE — ED Notes (Signed)
 Pt requesting pain med for left shoulder

## 2024-05-25 NOTE — ED Notes (Signed)
 CCMD Called

## 2024-05-25 NOTE — Discharge Instructions (Signed)
 Your x-ray did not show any concerning findings.  Please use the Voltaren  gel and see if this helps with your symptoms.  Continue taking the tramadol  that was prescribed a few days ago.  Please call and schedule a follow-up appointment with the orthopedic doctor at the number provided.  Return to the ER for worsening symptoms.

## 2024-05-25 NOTE — ED Provider Notes (Signed)
  EMERGENCY DEPARTMENT AT [facility] Provider Note   CSN: [id number] Arrival date & time: 05/25/24  1011     Patient presents with: Chest Pain   Ryan [name] is a 63 y.o. male.   63 year old male with past medical Struve atrial fibrillation and recently diagnosed splenic and renal infarcts presenting to the emergency department today with shoulder pain.  Patient states that this been going now for months.  He denies any recent injuries.  States that he has been evaluated for this and it has persisted.  He denies any fevers or chills.  He states the pain is in his left shoulder.  Does not radiate.  Somewhat worse with movement.  He states that his abdominal discomfort has been relatively well-controlled since leaving the hospital.  Denies any nausea or vomiting.  He denies any specific chest pain with this.  He came to the emergency department today for further evaluation regarding this.   Chest Pain      Prior to Admission medications   Medication Sig Start Date End Date Taking? Authorizing Provider  diclofenac  Sodium (VOLTAREN ) 1 % GEL Apply 2 g topically 4 (four) times daily. 05/25/24  Yes Carin Charleston, MD  apixaban  (ELIQUIS ) 5 MG TABS tablet Take 1 tablet (5 mg total) by mouth 2 (two) times daily. 05/22/24 08/20/24  Carin Charleston, MD  dicyclomine  (BENTYL ) 20 MG tablet Take 1 tablet (20 mg total) by mouth 2 (two) times daily as needed for spasms. 05/21/24   Trish Furl, MD  diltiazem  (CARDIZEM  CD) 240 MG 24 hr capsule Take 1 capsule (240 mg total) by mouth daily. 05/22/24   Carin Charleston, MD  glipiZIDE (GLUCOTROL) 10 MG tablet Take 10 mg by mouth daily. 03/04/21   [provider]  insulin  NPH-regular Human (NOVOLIN  70/30) (70-30) 100 UNIT/ML injection Inject 35 units into the skin 2 times daily with a meal. Patient taking differently: Inject 45 Units into the skin 2 (two) times daily with a meal. 07/25/20   Goodrich, Callie E, PA-C  losartan  (COZAAR )  50 MG tablet Take 1 tablet (50 mg total) by mouth daily. 04/26/23 12/19/23  Bary Boss, DO  ondansetron  (ZOFRAN -ODT) 8 MG disintegrating tablet Take 1 tablet (8 mg total) by mouth every 8 (eight) hours as needed for nausea or vomiting. 05/21/24   Trish Furl, MD  pantoprazole  (PROTONIX ) 40 MG tablet Take 1 tablet (40 mg total) by mouth daily. 05/21/24   Trish Furl, MD  promethazine -dextromethorphan (PROMETHAZINE -DM) 6.25-15 MG/5ML syrup Take 5 mLs by mouth 4 (four) times daily as needed for cough. 04/11/23   Rising, Ivette Marks, PA-C  traMADol  (ULTRAM ) 50 MG tablet Take 1 tablet (50 mg total) by mouth every 6 (six) hours as needed. 05/22/24   Carin Charleston, MD  ZYRTEC ALLERGY 10 MG tablet Take 10 mg by mouth in the morning. 03/03/20   [provider]    Allergies: Patient has no known allergies.    Review of Systems  Cardiovascular:  Positive for chest pain.  Musculoskeletal:        Left shoulder pain  All other systems reviewed and are negative.   Updated Vital Signs BP (!) 127/54 (BP Location: Left Arm)   Pulse 70   Temp 98.5 F (36.9 C) (Oral)   Resp 17   Ht 5' 9 (1.753 m)   Wt 104.3 kg   SpO2 97%   BMI 33.97 kg/m   Physical Exam Vitals and nursing note reviewed.  Gen: NAD Eyes: PERRL, EOMI HEENT: no oropharyngeal swelling Neck: trachea midline Resp: clear to auscultation bilaterally Card: RRR, no murmurs, rubs, or gallops Abd: nontender, nondistended Extremities: no calf tenderness, no edema MSK: Normal range of motion actively and passively of the left shoulder, no significant swelling noted, compartments are soft, no palpable cords noted just close in the extremities and no tenderness over the deep veins Vascular: 2+ radial pulses bilaterally, 2+ DP pulses bilaterally Skin: no rashes Psyc: acting appropriately   (all labs ordered are listed, but only abnormal results are displayed) Labs Reviewed  BASIC METABOLIC PANEL WITH GFR - Abnormal; Notable for the  following components:      Result Value   Sodium 134 (*)    Potassium 3.2 (*)    CO2 17 (*)    Creatinine, Ser 1.75 (*)    Calcium 8.6 (*)    GFR, Estimated 44 (*)    All other components within normal limits  CBC - Abnormal; Notable for the following components:   Hemoglobin 10.5 (*)    HCT 33.6 (*)    MCV 74.8 (*)    MCH 23.4 (*)    RDW 18.0 (*)    All other components within normal limits  TROPONIN I (HIGH SENSITIVITY)  TROPONIN I (HIGH SENSITIVITY)    EKG: None  Radiology: DG Shoulder Left Port Result Date: 05/25/2024 CLINICAL DATA:  Left shoulder pain EXAM: LEFT SHOULDER COMPARISON:  Left shoulder radiograph dated 05/06/2022 FINDINGS: There is no evidence of fracture or dislocation. Previously noted calcifications along greater tuberosity are no longer seen. Soft tissues are unremarkable. IMPRESSION: 1. No acute fracture or dislocation. 2. Previously noted calcifications along greater tuberosity are no longer seen. Electronically Signed   By: Limin  Xu M.D.   On: 05/25/2024 11:13     Procedures   Medications Ordered in the ED  oxyCODONE -acetaminophen  (PERCOCET/ROXICET) 5-325 MG per tablet 1 tablet (1 tablet Oral Given 05/25/24 1050)                                    Medical Decision Making 63 year old male with past medical history of atrial fibrillation and recently diagnosed splenic and renal infarcts presenting to the emergency department today with left shoulder pain.  Will further evaluate him here with an EKG and troponin to eval for atypical ACS.  Also obtain x-ray of his left shoulder to evaluate for acute bony abnormalities.  I will give the patient Percocet for pain and reevaluate.  He does not have any findings on exam consistent with DVT at this time.  Exam and duration of symptoms not consistent with septic arthritis at this time.  The patient's labs are unremarkable.  X-ray is negative.  The patient will be discharged with return precautions.  Amount  and/or Complexity of Data Reviewed Labs: ordered. Radiology: ordered.  Risk Prescription drug management.        Final diagnoses:  Chronic left shoulder pain    ED Discharge Orders          Ordered    diclofenac  Sodium (VOLTAREN ) 1 % GEL  4 times daily        05/25/24 1144               Carin Charleston, MD 05/25/24 1146

## 2024-05-25 NOTE — ED Triage Notes (Signed)
 Patient bib ems for left shoulder chest pain.  Patient was here the other day and diagnosed with splenic and renal infarcts.  History of afib.

## 2024-05-31 DIAGNOSIS — M25512 Pain in left shoulder: Secondary | ICD-10-CM | POA: Insufficient documentation

## 2024-06-05 ENCOUNTER — Other Ambulatory Visit: Payer: Self-pay

## 2024-06-05 ENCOUNTER — Emergency Department (HOSPITAL_COMMUNITY)
Admission: EM | Admit: 2024-06-05 | Discharge: 2024-06-05 | Disposition: A | Discharge status: Home / Self Care | Attending: Emergency Medicine | Admitting: Emergency Medicine

## 2024-06-05 ENCOUNTER — Encounter (HOSPITAL_COMMUNITY): Payer: Self-pay

## 2024-06-05 DIAGNOSIS — T679XXA Effect of heat and light, unspecified, initial encounter: Secondary | ICD-10-CM

## 2024-06-05 DIAGNOSIS — R0602 Shortness of breath: Secondary | ICD-10-CM | POA: Diagnosis present

## 2024-06-05 DIAGNOSIS — E119 Type 2 diabetes mellitus without complications: Secondary | ICD-10-CM | POA: Insufficient documentation

## 2024-06-05 DIAGNOSIS — R55 Syncope and collapse: Secondary | ICD-10-CM | POA: Diagnosis not present

## 2024-06-05 DIAGNOSIS — X30XXXA Exposure to excessive natural heat, initial encounter: Secondary | ICD-10-CM | POA: Insufficient documentation

## 2024-06-05 DIAGNOSIS — I4891 Unspecified atrial fibrillation: Secondary | ICD-10-CM | POA: Insufficient documentation

## 2024-06-05 DIAGNOSIS — Z794 Long term (current) use of insulin: Secondary | ICD-10-CM | POA: Diagnosis not present

## 2024-06-05 DIAGNOSIS — Z7901 Long term (current) use of anticoagulants: Secondary | ICD-10-CM | POA: Diagnosis not present

## 2024-06-05 LAB — CBC
HCT: 36.5 % — ABNORMAL LOW (ref 39.0–52.0)
Hemoglobin: 11.3 g/dL — ABNORMAL LOW (ref 13.0–17.0)
MCH: 23.2 pg — ABNORMAL LOW (ref 26.0–34.0)
MCHC: 31 g/dL (ref 30.0–36.0)
MCV: 74.9 fL — ABNORMAL LOW (ref 80.0–100.0)
Platelets: 198 10*3/uL (ref 150–400)
RBC: 4.87 MIL/uL (ref 4.22–5.81)
RDW: 18.9 % — ABNORMAL HIGH (ref 11.5–15.5)
WBC: 7.6 10*3/uL (ref 4.0–10.5)
nRBC: 0 % (ref 0.0–0.2)

## 2024-06-05 LAB — BASIC METABOLIC PANEL WITH GFR
Anion gap: 12 (ref 5–15)
BUN: 15 mg/dL (ref 6–20)
CO2: 18 mmol/L — ABNORMAL LOW (ref 22–32)
Calcium: 8.1 mg/dL — ABNORMAL LOW (ref 8.9–10.3)
Chloride: 103 mmol/L (ref 98–111)
Creatinine, Ser: 1.78 mg/dL — ABNORMAL HIGH (ref 0.61–1.24)
GFR, Estimated: 43 mL/min — ABNORMAL LOW (ref >60)
Glucose, Bld: 271 mg/dL — ABNORMAL HIGH (ref 70–99)
Potassium: 3.9 mmol/L (ref 3.5–5.1)
Sodium: 133 mmol/L — ABNORMAL LOW (ref 135–145)

## 2024-06-05 LAB — TROPONIN I (HIGH SENSITIVITY)
Troponin I (High Sensitivity): 10 ng/L (ref <18)
Troponin I (High Sensitivity): 10 ng/L (ref <18)

## 2024-06-05 MED ORDER — SODIUM CHLORIDE 0.9 % IV BOLUS
1000.0000 mL | Freq: Once | INTRAVENOUS | Status: AC
  Administered 2024-06-05: 1000 mL via INTRAVENOUS

## 2024-06-05 NOTE — Discharge Instructions (Addendum)
 Please read and follow all provided instructions.  Your diagnoses today include:  1. Near syncope   2. Heat exposure, initial encounter   3. Atrial fibrillation, unspecified type (HCC)     Tests performed today include: An EKG of your heart: shows atrial flutter Cardiac enzymes: a blood test for heart muscle damage, was normal Complete blood cell count: Mildly low red blood cell count but stable Basic metabolic panel: Chronic kidney disease, but stable Vital signs. See below for your results today.   Medications prescribed:  None  Take any prescribed medications only as directed.  Follow-up instructions: Please follow-up with your primary care provider in the next 3 days for recheck of symptoms.   It is very important that you take your medications as prescribed to prevent uncontrolled atrial fibrillation or stroke from atrial fibrillation.  Return instructions:  SEEK IMMEDIATE MEDICAL ATTENTION IF: You have severe chest pain, especially if the pain is crushing or pressure-like and spreads to the arms, back, neck, or jaw, or if you have sweating, nausea or vomiting, or trouble with breathing. THIS IS AN EMERGENCY. Do not wait to see if the pain will go away. Get medical help at once. Call 911. DO NOT drive yourself to the hospital.  You have significant dizziness, if you pass out, or have trouble walking.  You have chest pain not typical of your usual pain for which you originally saw your caregiver.  You have any other emergent concerns regarding your health.  Your vital signs today were: BP (!) 140/76   Pulse (!) 48   Temp 97.7 F (36.5 C)   Resp (!) 23   SpO2 100%  If your blood pressure (BP) was elevated above 135/85 this visit, please have this repeated by your doctor within one month. --------------

## 2024-06-05 NOTE — ED Triage Notes (Signed)
 Pt bib ems for heat exahustion, [place] don[place] plasma. Pt given 1L Iv fluid PTA. Pt has no complaints at this time. Pt was a flutter/bygeminy on the monitor.

## 2024-06-05 NOTE — ED Provider Notes (Signed)
 [place] [facility] AT [facility] Provider Note   CSN: [id number] Arrival date & time: 06/05/24  1056     Patient presents with: Heat Exposure   Ryan [name] is a 63 y.o. male.   Patient with history of atrial fibrillation on apixaban  (reports poor compliance and has not had in several days), history of diabetes on insulin  --presents from postop today after donating plasma.  He states that he walked out of the plasma center and [place] the bus stop.  He started having some shortness of breath.  He states that he felt a wave of heat and overcome him.  When he was having trouble, bystander called EMS.  He denies associated chest pain.       Prior to Admission medications   Medication Sig Start Date End Date Taking? Authorizing Provider  apixaban  (ELIQUIS ) 5 MG TABS tablet Take 1 tablet (5 mg total) by mouth 2 (two) times daily. 05/22/24 08/20/24  Ula Prentice SAUNDERS, MD  diclofenac  Sodium (VOLTAREN ) 1 % GEL Apply 2 g topically 4 (four) times daily. 05/25/24   Ula Prentice SAUNDERS, MD  dicyclomine  (BENTYL ) 20 MG tablet Take 1 tablet (20 mg total) by mouth 2 (two) times daily as needed for spasms. 05/21/24   Randol Simmonds, MD  diltiazem  (CARDIZEM  CD) 240 MG 24 hr capsule Take 1 capsule (240 mg total) by mouth daily. 05/22/24   Ula Prentice SAUNDERS, MD  glipiZIDE (GLUCOTROL) 10 MG tablet Take 10 mg by mouth daily. 03/04/21   [provider]  insulin  NPH-regular Human (NOVOLIN  70/30) (70-30) 100 UNIT/ML injection Inject 35 units into the skin 2 times daily with a meal. Patient taking differently: Inject 45 Units into the skin 2 (two) times daily with a meal. 07/25/20   Goodrich, Callie E, PA-C  losartan  (COZAAR ) 50 MG tablet Take 1 tablet (50 mg total) by mouth daily. 04/26/23 12/19/23  Shona Terry SAILOR, DO  ondansetron  (ZOFRAN -ODT) 8 MG disintegrating tablet Take 1 tablet (8 mg total) by mouth every 8 (eight) hours as needed for nausea or vomiting. 05/21/24   Randol Simmonds, MD   pantoprazole  (PROTONIX ) 40 MG tablet Take 1 tablet (40 mg total) by mouth daily. 05/21/24   Randol Simmonds, MD  promethazine -dextromethorphan (PROMETHAZINE -DM) 6.25-15 MG/5ML syrup Take 5 mLs by mouth 4 (four) times daily as needed for cough. 04/11/23   Rising, Asberry, PA-C  traMADol  (ULTRAM ) 50 MG tablet Take 1 tablet (50 mg total) by mouth every 6 (six) hours as needed. 05/22/24   Ula Prentice SAUNDERS, MD  ZYRTEC ALLERGY 10 MG tablet Take 10 mg by mouth in the morning. 03/03/20   [provider]    Allergies: Patient has no known allergies.    Review of Systems  Updated Vital Signs BP (!) 147/76   Pulse (!) 58   Temp 97.7 F (36.5 C)   Resp (!) 25   SpO2 92%   Physical Exam Vitals and nursing note reviewed.  Constitutional:      Appearance: He is well-developed. He is not diaphoretic.  HENT:     Head: Normocephalic and atraumatic.     Mouth/Throat:     Mouth: Mucous membranes are not dry.   Eyes:     Conjunctiva/sclera: Conjunctivae normal.   Neck:     Vascular: Normal carotid pulses. No carotid bruit or JVD.     Trachea: Trachea normal. No tracheal deviation.   Cardiovascular:     Rate and Rhythm: Regular rhythm. Tachycardia present.  Pulses: No decreased pulses.          Radial pulses are 2+ on the right side and 2+ on the left side.     Heart sounds: Normal heart sounds, S1 normal and S2 normal. Heart sounds not distant. No murmur heard. Pulmonary:     Effort: Pulmonary effort is normal. No respiratory distress.     Breath sounds: Normal breath sounds. No wheezing.  Chest:     Chest wall: No tenderness.  Abdominal:     General: Bowel sounds are normal.     Palpations: Abdomen is soft.     Tenderness: There is no abdominal tenderness. There is no guarding or rebound.   Musculoskeletal:     Cervical back: Normal range of motion and neck supple. No muscular tenderness.     Right lower leg: No edema.     Left lower leg: No edema.   Skin:    General: Skin is warm  and dry.     Coloration: Skin is not pale.   Neurological:     Mental Status: He is alert. Mental status is at baseline.     Cranial Nerves: No cranial nerve deficit.     Sensory: No sensory deficit.     Motor: No weakness.   Psychiatric:        Mood and Affect: Mood normal.    ED Course  Patient seen and examined. History obtained directly from patient.   Labs/EKG: Ordered CBC, BMP, troponin.  Personally reviewed and interpreted EKG as below.  Imaging: None ordered  Medications/Fluids: Ordered: IV fluids  Most recent vital signs reviewed and are as follows: BP (!) 147/76   Pulse (!) 58   Temp 97.7 F (36.5 C)   Resp (!) 25   SpO2 92%   Initial impression: Near syncope?  In setting of heat exposure and recent plasma donation.  Given comorbidities, will check lab work.  No focal neurologic symptoms to suggest CVA.  3:21 PM Reassessment performed. Patient appears stable, comfortable.  No recurrent symptoms.  He is eating and drinking in the room.  Once again encouraged compliance with home medications.  Labs personally reviewed and interpreted including: CBC with minimally low hemoglobin 11.3 but near baseline; BMP glucose 271 with normal anion gap, creatinine 1.78 with BUN of 15, at baseline. Troponin 10, awaiting 2nd trop.   Reviewed pertinent lab work and imaging with patient at bedside. Questions answered.   Most current vital signs reviewed and are as follows: BP 133/80   Pulse (!) 48   Temp 98.3 F (36.8 C) (Oral)   Resp (!) 24   SpO2 100%   Plan: Discharge to home if 2nd troponin is stable.   Prescriptions written for: None  Other home care instructions discussed: Rest, hydration  ED return instructions discussed: Chest pain, shortness of breath, lightheadedness or syncope  Follow-up instructions discussed: Patient encouraged to follow-up with their PCP in 3 days if not improving.  3:27 PM 2nd trop negative.   (all labs ordered are listed, but only  abnormal results are displayed) Labs Reviewed  CBC - Abnormal; Notable for the following components:      Result Value   Hemoglobin 11.3 (*)    HCT 36.5 (*)    MCV 74.9 (*)    MCH 23.2 (*)    RDW 18.9 (*)    All other components within normal limits  BASIC METABOLIC PANEL WITH GFR - Abnormal; Notable for the following components:   Sodium 133 (*)  CO2 18 (*)    Glucose, Bld 271 (*)    Creatinine, Ser 1.78 (*)    Calcium 8.1 (*)    GFR, Estimated 43 (*)    All other components within normal limits  TROPONIN I (HIGH SENSITIVITY)  TROPONIN I (HIGH SENSITIVITY)    EKG: EKG Interpretation Date/Time:  Tuesday June 05 2024 11:05:18 EDT Ventricular Rate:  100 PR Interval:    QRS Duration:  120 QT Interval:  473 QTC Calculation: 546 R Axis:   -24  Text Interpretation: Atrial flutter Anterior infarct, old Nonspecific T abnormalities, lateral leads Minimal ST elevation, inferior leads Prolonged QT interval atrial flutter otherwise no significant change from last EKG Confirmed by Ellouise Fine ([id number]) on 06/05/2024 11:07:30 AM  Radiology: No results found.   Procedures   Medications Ordered in the ED  sodium chloride  0.9 % bolus 1,000 mL (0 mLs Intravenous Stopped 06/05/24 1416)                                    Medical Decision Making Amount and/or Complexity of Data Reviewed Labs: ordered.   Patient with lightheadedness in setting of heat exposure as well as degree of hypovolemia due to dad being just donated plasma.  He did have some shortness of breath.  No chest pain.  Patient has been doing well in the ED.  Cardiac workup with normal troponin, EKG with A-fib/flutter.  No focal neurologic deficits to suggest CVA.  No chest pain or hypoxia to suggest PE.  Suspect that this is secondary to heat exposure after donating plasma.  He's recovered well.   The patient's vital signs, pertinent lab work and imaging were reviewed and interpreted as discussed in the ED course.  Hospitalization was considered for further testing, treatments, or serial exams/observation. However as patient is well-appearing, has a stable exam, and reassuring studies today, I do not feel that they warrant admission at this time. This plan was discussed with the patient who verbalizes agreement and comfort with this plan and seems reliable and able to return to the Emergency Department with worsening or changing symptoms.       Final diagnoses:  Near syncope  Heat exposure, initial encounter  Atrial fibrillation, unspecified type [facility])    ED Discharge Orders     None          Desiderio Chew, PA-C 06/05/24 1532    Ellouise Fine K, DO 06/05/24 [phone]

## 2024-07-16 ENCOUNTER — Emergency Department (HOSPITAL_COMMUNITY)

## 2024-07-16 ENCOUNTER — Emergency Department (HOSPITAL_COMMUNITY)
Admission: EM | Admit: 2024-07-16 | Discharge: 2024-07-16 | Disposition: A | Discharge status: Home / Self Care | Attending: Emergency Medicine | Admitting: Emergency Medicine

## 2024-07-16 ENCOUNTER — Other Ambulatory Visit: Payer: Self-pay

## 2024-07-16 DIAGNOSIS — R519 Headache, unspecified: Secondary | ICD-10-CM | POA: Diagnosis present

## 2024-07-16 DIAGNOSIS — Z794 Long term (current) use of insulin: Secondary | ICD-10-CM | POA: Diagnosis not present

## 2024-07-16 DIAGNOSIS — Z7901 Long term (current) use of anticoagulants: Secondary | ICD-10-CM | POA: Insufficient documentation

## 2024-07-16 DIAGNOSIS — Z7984 Long term (current) use of oral hypoglycemic drugs: Secondary | ICD-10-CM | POA: Diagnosis not present

## 2024-07-16 DIAGNOSIS — I129 Hypertensive chronic kidney disease with stage 1 through stage 4 chronic kidney disease, or unspecified chronic kidney disease: Secondary | ICD-10-CM | POA: Insufficient documentation

## 2024-07-16 DIAGNOSIS — E876 Hypokalemia: Secondary | ICD-10-CM | POA: Insufficient documentation

## 2024-07-16 DIAGNOSIS — E1122 Type 2 diabetes mellitus with diabetic chronic kidney disease: Secondary | ICD-10-CM | POA: Insufficient documentation

## 2024-07-16 DIAGNOSIS — N183 Chronic kidney disease, stage 3 unspecified: Secondary | ICD-10-CM | POA: Insufficient documentation

## 2024-07-16 DIAGNOSIS — Z8616 Personal history of COVID-19: Secondary | ICD-10-CM | POA: Diagnosis not present

## 2024-07-16 DIAGNOSIS — Z79899 Other long term (current) drug therapy: Secondary | ICD-10-CM | POA: Insufficient documentation

## 2024-07-16 LAB — COMPREHENSIVE METABOLIC PANEL WITH GFR
ALT: 14 U/L (ref 0–44)
AST: 23 U/L (ref 15–41)
Albumin: 3.4 g/dL — ABNORMAL LOW (ref 3.5–5.0)
Alkaline Phosphatase: 101 U/L (ref 38–126)
Anion gap: 13 (ref 5–15)
BUN: 8 mg/dL (ref 6–20)
CO2: 20 mmol/L — ABNORMAL LOW (ref 22–32)
Calcium: 9 mg/dL (ref 8.9–10.3)
Chloride: 104 mmol/L (ref 98–111)
Creatinine, Ser: 1.37 mg/dL — ABNORMAL HIGH (ref 0.61–1.24)
GFR, Estimated: 59 mL/min — ABNORMAL LOW (ref >60)
Glucose, Bld: 195 mg/dL — ABNORMAL HIGH (ref 70–99)
Potassium: 3.3 mmol/L — ABNORMAL LOW (ref 3.5–5.1)
Sodium: 137 mmol/L (ref 135–145)
Total Bilirubin: 0.9 mg/dL (ref 0.0–1.2)
Total Protein: 7.3 g/dL (ref 6.5–8.1)

## 2024-07-16 LAB — CBC WITH DIFFERENTIAL/PLATELET
Abs Immature Granulocytes: 0.02 K/uL (ref 0.00–0.07)
Basophils Absolute: 0 K/uL (ref 0.0–0.1)
Basophils Relative: 1 %
Eosinophils Absolute: 0 K/uL (ref 0.0–0.5)
Eosinophils Relative: 1 %
HCT: 42.7 % (ref 39.0–52.0)
Hemoglobin: 13.3 g/dL (ref 13.0–17.0)
Immature Granulocytes: 0 %
Lymphocytes Relative: 15 %
Lymphs Abs: 1.1 K/uL (ref 0.7–4.0)
MCH: 22.9 pg — ABNORMAL LOW (ref 26.0–34.0)
MCHC: 31.1 g/dL (ref 30.0–36.0)
MCV: 73.4 fL — ABNORMAL LOW (ref 80.0–100.0)
Monocytes Absolute: 0.7 K/uL (ref 0.1–1.0)
Monocytes Relative: 10 %
Neutro Abs: 5.5 K/uL (ref 1.7–7.7)
Neutrophils Relative %: 73 %
Platelets: 288 K/uL (ref 150–400)
RBC: 5.82 MIL/uL — ABNORMAL HIGH (ref 4.22–5.81)
RDW: 19.2 % — ABNORMAL HIGH (ref 11.5–15.5)
WBC: 7.5 K/uL (ref 4.0–10.5)
nRBC: 0 % (ref 0.0–0.2)

## 2024-07-16 LAB — I-STAT CHEM 8, ED
BUN: 9 mg/dL (ref 6–20)
Calcium, Ion: 1.1 mmol/L — ABNORMAL LOW (ref 1.15–1.40)
Chloride: 103 mmol/L (ref 98–111)
Creatinine, Ser: 1.3 mg/dL — ABNORMAL HIGH (ref 0.61–1.24)
Glucose, Bld: 193 mg/dL — ABNORMAL HIGH (ref 70–99)
HCT: 44 % (ref 39.0–52.0)
Hemoglobin: 15 g/dL (ref 13.0–17.0)
Potassium: 3.3 mmol/L — ABNORMAL LOW (ref 3.5–5.1)
Sodium: 139 mmol/L (ref 135–145)
TCO2: 21 mmol/L — ABNORMAL LOW (ref 22–32)

## 2024-07-16 LAB — TROPONIN I (HIGH SENSITIVITY)
Troponin I (High Sensitivity): 9 ng/L (ref <18)
Troponin I (High Sensitivity): 10 ng/L (ref <18)

## 2024-07-16 LAB — CBG MONITORING, ED: Glucose-Capillary: 186 mg/dL — ABNORMAL HIGH (ref 70–99)

## 2024-07-16 MED ORDER — DIPHENHYDRAMINE HCL 50 MG/ML IJ SOLN
12.5000 mg | Freq: Once | INTRAMUSCULAR | Status: AC
  Administered 2024-07-16: 12.5 mg via INTRAVENOUS
  Filled 2024-07-16: qty 1

## 2024-07-16 MED ORDER — PROCHLORPERAZINE EDISYLATE 10 MG/2ML IJ SOLN
10.0000 mg | Freq: Once | INTRAMUSCULAR | Status: AC
  Administered 2024-07-16: 10 mg via INTRAVENOUS
  Filled 2024-07-16: qty 2

## 2024-07-16 MED ORDER — LACTATED RINGERS IV BOLUS
1000.0000 mL | Freq: Once | INTRAVENOUS | Status: AC
  Administered 2024-07-16: 1000 mL via INTRAVENOUS

## 2024-07-16 MED ORDER — FENTANYL CITRATE PF 50 MCG/ML IJ SOSY
50.0000 ug | PREFILLED_SYRINGE | Freq: Once | INTRAMUSCULAR | Status: AC
  Administered 2024-07-16: 50 ug via INTRAVENOUS
  Filled 2024-07-16: qty 1

## 2024-07-16 NOTE — Discharge Instructions (Addendum)
 We evaluated you for your headache.  Your testing in the emergency department is reassuring.  Your CT head did not show any dangerous findings.  We would recommend that you take your medications including Eliquis  as prescribed as this can help prevent stroke since you have A-fib.  Please follow-up closely with your primary doctor.

## 2024-07-16 NOTE — ED Triage Notes (Signed)
 Pt BIB EMS from home with CC of HA. Pt Aox4 but is extremely anxious and appears afraid. Pt tapping the right side of his head and face. Pt stating he has a very bad HA.

## 2024-07-16 NOTE — Progress Notes (Signed)
   07/16/24 1220  Spiritual Encounters  Type of Visit Initial  Care provided to: Pt and family  Reason for visit Trauma  OnCall Visit No  Spiritual Framework  Community/Connection Family  Patient Stress Factors None identified  Family Stress Factors Health changes  Interventions  Spiritual Care Interventions Made Compassionate presence;Established relationship of care and support  Intervention Outcomes  Outcomes Awareness of support;Awareness of health   Chaplain checked on Pt at bedside.. Pt was actively being treated by the medical team at the time of arrival. NO spiritual care was requested or needed at the moment. Chaplain ensured the Pt that chaplain services are available upon request.

## 2024-07-16 NOTE — ED Provider Notes (Signed)
 [place] EMERGENCY DEPARTMENT AT [facility] Provider Note  CSN: [id number] Arrival date & time: 07/16/24 0840  Chief Complaint(s) Headache  HPI Anterrio Mccleery is a 63 y.o. male history of atrial fibrillation, CKD, diabetes, polysubstance abuse, hypertension presenting to the emergency department with headache.  Patient reports that headache started this morning around 5 or 6 AM.  EMS reports that the wife reported that the patient was very fidgety this morning and seemed confused, complaining of severe headache.  Wife did find the patient on the ground around this time.  Patient denies nausea, vomiting.  Denies fevers or chills.  Reports headache began today.  Also reports some pain in the left shoulder which is chronic, as well as pain in the neck.  Denies pain in the arms or legs, abdominal pain, chest pain, difficulty breathing.  He reports he has been noncompliant with his Eliquis  for a long time and has not taken it for months   Past Medical History Past Medical History:  Diagnosis Date   Achilles tendon rupture right   Arthritis    back and shoulders   Atrial flutter with rapid ventricular response (HCC) 07/22/2020   Chest pain    a. 05/2011 - admit w/ c/p in setting of cocaine-->Cath:  nl cors, nl EF, no vasospasm noted.   CKD (chronic kidney disease), stage III (HCC)    COVID-19 virus infection 05/31/2019   Diabetes mellitus without complication (HCC)    Drug abuse (HCC)    cocaine, marijuana abuse   GERD (gastroesophageal reflux disease)    History of echocardiogram    a. 07/2020 Echo: EF 65-70%, no rwma, mild conc LVH, nl RV fxn, mildly dil LA.   Hypertension    Patient Active Problem List   Diagnosis Date Noted   Atrial fibrillation with RVR (HCC) 07/13/2023   Atrial flutter with rapid ventricular response (HCC) 04/25/2023   Chest pain 02/09/2023   Syncope 02/09/2023   Secondary hypercoagulable state (HCC) 01/06/2022   Foot pain, right 09/26/2020    Closed nondisplaced fracture of proximal phalanx of lesser toe of right foot 09/26/2020   Esophagitis    Midepigastric pain    Atrial flutter (HCC) 07/22/2020   Cocaine abuse (HCC)    Orthostatic syncope 05/31/2019   Diabetes mellitus type 2, insulin  dependent (HCC) 05/31/2019   Drug abuse (HCC)    CKD (chronic kidney disease), stage III (HCC)    Right ankle pain 05/15/2014   Uncontrolled hypertension 05/14/2014   Rupture of right Achilles tendon 05/02/2014   Home Medication(s) Prior to Admission medications   Medication Sig Start Date End Date Taking? Authorizing Provider  apixaban  (ELIQUIS ) 5 MG TABS tablet Take 1 tablet (5 mg total) by mouth 2 (two) times daily. 05/22/24 08/20/24  Ula Prentice SAUNDERS, MD  diclofenac  Sodium (VOLTAREN ) 1 % GEL Apply 2 g topically 4 (four) times daily. 05/25/24   Ula Prentice SAUNDERS, MD  dicyclomine  (BENTYL ) 20 MG tablet Take 1 tablet (20 mg total) by mouth 2 (two) times daily as needed for spasms. 05/21/24   Randol Simmonds, MD  diltiazem  (CARDIZEM  CD) 240 MG 24 hr capsule Take 1 capsule (240 mg total) by mouth daily. 05/22/24   Ula Prentice SAUNDERS, MD  glipiZIDE (GLUCOTROL) 10 MG tablet Take 10 mg by mouth daily. 03/04/21   [provider]  insulin  NPH-regular Human (NOVOLIN  70/30) (70-30) 100 UNIT/ML injection Inject 35 units into the skin 2 times daily with a meal. Patient taking differently: Inject 45 Units  into the skin 2 (two) times daily with a meal. 07/25/20   Goodrich, Callie E, PA-C  losartan  (COZAAR ) 50 MG tablet Take 1 tablet (50 mg total) by mouth daily. 04/26/23 12/19/23  Shona Terry SAILOR, DO  ondansetron  (ZOFRAN -ODT) 8 MG disintegrating tablet Take 1 tablet (8 mg total) by mouth every 8 (eight) hours as needed for nausea or vomiting. 05/21/24   Randol Simmonds, MD  pantoprazole  (PROTONIX ) 40 MG tablet Take 1 tablet (40 mg total) by mouth daily. 05/21/24   Randol Simmonds, MD  promethazine -dextromethorphan (PROMETHAZINE -DM) 6.25-15 MG/5ML syrup Take 5 mLs by mouth 4 (four) times  daily as needed for cough. 04/11/23   Rising, Asberry, PA-C  traMADol  (ULTRAM ) 50 MG tablet Take 1 tablet (50 mg total) by mouth every 6 (six) hours as needed. 05/22/24   Ula Prentice SAUNDERS, MD  ZYRTEC ALLERGY 10 MG tablet Take 10 mg by mouth in the morning. 03/03/20   [provider]                                                                                                                                    Past Surgical History Past Surgical History:  Procedure Laterality Date   ACHILLES TENDON SURGERY Right 05/02/2014   Procedure: RIGHT ACHILLES TENDON REPAIR;  Surgeon: Reyes JAYSON Billing, MD;  Location: [facility] ORS;  Service: Orthopedics;  Laterality: Right;   BIOPSY  07/25/2020   Procedure: BIOPSY;  Surgeon: Elicia Claw, MD;  Location: [facility] ENDOSCOPY;  Service: Gastroenterology;;   CARDIAC CATHETERIZATION  05.31.2012   showing patent coronaries with no visualized vasospasm and EF of 55-60 %   ESOPHAGOGASTRODUODENOSCOPY N/A 07/25/2020   Procedure: ESOPHAGOGASTRODUODENOSCOPY (EGD);  Surgeon: Elicia Claw, MD;  Location[facility] ENDOSCOPY;  Service: Gastroenterology;  Laterality: N/A;   ORBITAL FRACTURE SURGERY Left 2-3 yrs ago   Family History Family History  Problem Relation Age of Onset   Unexplained death Mother 59       She died in 08-07-16, he does not remember what she died of   Unexplained death Father        He has no information on his father    Social History Social History   Tobacco Use   Smoking status: Never   Smokeless tobacco: Never  Vaping Use   Vaping status: Never Used  Substance Use Topics   Alcohol use: Yes    Comment: occ   Drug use: Not Currently    Types: Cocaine, Marijuana    Comment: last cocaine use 1 year ago, last marijuana use 4 to 5 years ago 01/06/22   Allergies Patient has no known allergies.  Review of Systems Review of Systems  All other systems reviewed and are negative.   Physical Exam Vital Signs  I have reviewed the triage vital  signs BP (!) 146/98   Pulse 87   Temp 98.2 F (36.8 C)   Resp 14   Ht 5'  9 (1.753 m)   Wt 104.3 kg   SpO2 100%   BMI 33.97 kg/m  Physical Exam Vitals and nursing note reviewed.  Constitutional:      General: He is not in acute distress.    Appearance: Normal appearance.  HENT:     Mouth/Throat:     Mouth: Mucous membranes are moist.  Eyes:     Conjunctiva/sclera: Conjunctivae normal.  Cardiovascular:     Rate and Rhythm: Normal rate and regular rhythm.  Pulmonary:     Effort: Pulmonary effort is normal. No respiratory distress.     Breath sounds: Normal breath sounds.  Abdominal:     General: Abdomen is flat.     Palpations: Abdomen is soft.     Tenderness: There is no abdominal tenderness.  Musculoskeletal:     Right lower leg: No edema.     Left lower leg: No edema.  Skin:    General: Skin is warm and dry.     Capillary Refill: Capillary refill takes less than 2 seconds.  Neurological:     Mental Status: He is alert and oriented to person, place, and time. Mental status is at baseline.     Comments: Cranial nerves II through XII intact, strength 5 out of 5 in the bilateral upper and lower extremities, no sensory deficit to light touch, no dysmetria on finger-nose-finger testing, ambulatory with steady gait.  Psychiatric:        Mood and Affect: Mood normal.        Behavior: Behavior normal.     ED Results and Treatments Labs (all labs ordered are listed, but only abnormal results are displayed) Labs Reviewed  COMPREHENSIVE METABOLIC PANEL WITH GFR - Abnormal; Notable for the following components:      Result Value   Potassium 3.3 (*)    CO2 20 (*)    Glucose, Bld 195 (*)    Creatinine, Ser 1.37 (*)    Albumin 3.4 (*)    GFR, Estimated 59 (*)    All other components within normal limits  CBC WITH DIFFERENTIAL/PLATELET - Abnormal; Notable for the following components:   RBC 5.82 (*)    MCV 73.4 (*)    MCH 22.9 (*)    RDW 19.2 (*)    All other components  within normal limits  I-STAT CHEM 8, ED - Abnormal; Notable for the following components:   Potassium 3.3 (*)    Creatinine, Ser 1.30 (*)    Glucose, Bld 193 (*)    Calcium, Ion 1.10 (*)    TCO2 21 (*)    All other components within normal limits  CBG MONITORING, ED - Abnormal; Notable for the following components:   Glucose-Capillary 186 (*)    All other components within normal limits  URINALYSIS, W/ REFLEX TO CULTURE (INFECTION SUSPECTED)  RAPID URINE DRUG SCREEN, HOSP PERFORMED  TROPONIN I (HIGH SENSITIVITY)  TROPONIN I (HIGH SENSITIVITY)  Radiology CT Head Wo Contrast Result Date: 07/16/2024 CLINICAL DATA:  82-year-old male with headache, anxiety, altered mental status. EXAM: CT HEAD WITHOUT CONTRAST TECHNIQUE: Contiguous axial images were obtained from the base of the skull through the vertex without intravenous contrast. RADIATION DOSE REDUCTION: This exam was performed according to the departmental dose-optimization program which includes automated exposure control, adjustment of the mA and/or kV according to patient size and/or use of iterative reconstruction technique. COMPARISON:  Cervical spine CT today reported separately. Head CT 03/11/2021. FINDINGS: Brain: Cerebral volume is within normal limits for age. No midline shift, ventriculomegaly, mass effect, evidence of mass lesion, intracranial hemorrhage or evidence of cortically based acute infarction. Patchy mild to moderate bilateral cerebral white matter hypodensity is stable since 2022. Maintained gray-white differentiation. Vascular: No suspicious intracranial vascular hyperdensity. Mild Calcified atherosclerosis at the skull base. Skull: Chronic left orbital wall fractures, stable chronic ORIF left frontal sinus and superior orbital rim. No acute osseous abnormality identified. Sinuses/Orbits: Chronic paranasal  sinus disease, but substantially improved bilateral paranasal sinus aeration compared to 2022. 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 Tympanic cavities and mastoids well aerated. Other: No acute orbit or scalp soft tissue finding. IMPRESSION: 1. No acute intracranial abnormality. Stable non contrast CT appearance of chronic cerebral white matter disease, posttraumatic and postoperative changes about the left orbit and frontal sinus. 2. Chronic paranasal sinus disease has improved since 2022. Electronically Signed   By: VEAR Hurst M.D.   On: 07/16/2024 09:40   CT Cervical Spine Wo Contrast Result Date: 07/16/2024 CLINICAL DATA:  Neck trauma, intoxicated or obtunded (Age >= 16y) EXAM: CT CERVICAL SPINE WITHOUT CONTRAST TECHNIQUE: Multidetector CT imaging of the cervical spine was performed without intravenous contrast. Multiplanar CT image reconstructions were also generated. RADIATION DOSE REDUCTION: This exam was performed according to the departmental dose-optimization program which includes automated exposure control, adjustment of the mA and/or kV according to patient size and/or use of iterative reconstruction technique. COMPARISON:  CT cervical spine 03/12/2009. FINDINGS: Alignment: Normal. Skull base and vertebrae: No evidence of acute fracture or traumatic subluxation. Probable interbody and interfacetal ankylosis at C2-3. Soft tissues and spinal canal: No prevertebral fluid or swelling. No visible canal hematoma. Disc levels: No large disc herniation or high-grade foraminal narrowing demonstrated. Progressive mild multilevel spondylosis with interbody and interfacetal ankylosis at C2-3. Asymmetric facet arthropathy on the left at C7-T1. Upper chest: Clear lung apices. Other: Mild bilateral carotid atherosclerosis. IMPRESSION: 1. No evidence of acute cervical spine fracture, traumatic subluxation or static signs of instability. 2. Progressive mild multilevel spondylosis. Electronically Signed    By: Elsie Perone M.D.   On: 07/16/2024 09:37   DG Chest Portable 1 View Result Date: 07/16/2024 CLINICAL DATA:  Chest pain EXAM: PORTABLE CHEST 1 VIEW COMPARISON:  07/13/2023 FINDINGS: Numerous leads and wires project over the chest. Patient rotated right. Mild cardiomegaly. No pleural effusion or pneumothorax. Low lung volumes with resultant pulmonary interstitial prominence. Right infrahilar volume loss with subsegmental atelectasis. No lobar consolidation. IMPRESSION: Cardiomegaly and low lung volumes.  No acute findings. Electronically Signed   By: Rockey Kilts M.D.   On: 07/16/2024 09:14    Pertinent labs & imaging results that were available during my care of the patient were reviewed by me and considered in my medical decision making (see MDM for details).  Medications Ordered in ED Medications  fentaNYL  (SUBLIMAZE ) injection 50 mcg (50 mcg Intravenous Given 07/16/24 0927)  prochlorperazine  (COMPAZINE ) injection 10 mg (10 mg Intravenous Given 07/16/24  9071)  diphenhydrAMINE  (BENADRYL ) injection 12.5 mg (12.5 mg Intravenous Given 07/16/24 0928)  lactated ringers  bolus 1,000 mL (0 mLs Intravenous Stopped 07/16/24 1403)                                                                                                                                     Procedures Procedures  (including critical care time)  Medical Decision Making / ED Course   MDM:  63 year old presenting to the emergency department with acute onset of headache.  Patient overall well-appearing, physical examination with no focal neurologic deficit.  ECG shows A-fib.  Differential includes subarachnoid headache, intracranial bleeding from fall, migraine headache, cluster headache, tension headache.  Considered other process such as meningitis but patient denies fever, no meningismus on exam and headache was acute onset.  Will check CT head, labs.  He also reports left shoulder pain which seems somewhat chronic but will check  troponin and EKG as well.  Will treat headache and reassess.   Clinical Course as of 07/16/24 1601  Mon Jul 16, 2024  1559 Discussed with wife who provided no additional history, reports patient seems back to normal.  Discussed with patient's sister also reports that patient does get frequent migraine headaches.  Patient reports that he feels much better headache is gone.  Labs are reassuring occluding delta troponin.  Given negative head CT with less than 6 hours of symptoms subarachnoid hemorrhage is ruled out. Will discharge patient to home. All questions answered. Patient comfortable with plan of discharge. Return precautions discussed with patient and specified on the after visit summary.  [WS]    Clinical Course User Index [WS] Francesca Elsie CROME, MD     Additional history obtained: -Additional history obtained from family, ems, and spouse -External records from outside source obtained and reviewed including: Chart review including previous notes, labs, imaging, consultation notes including prior er visit including similar episode few years ago    Lab Tests: -I ordered, reviewed, and interpreted labs.   The pertinent results include:   Labs Reviewed  COMPREHENSIVE METABOLIC PANEL WITH GFR - Abnormal; Notable for the following components:      Result Value   Potassium 3.3 (*)    CO2 20 (*)    Glucose, Bld 195 (*)    Creatinine, Ser 1.37 (*)    Albumin 3.4 (*)    GFR, Estimated 59 (*)    All other components within normal limits  CBC WITH DIFFERENTIAL/PLATELET - Abnormal; Notable for the following components:   RBC 5.82 (*)    MCV 73.4 (*)    MCH 22.9 (*)    RDW 19.2 (*)    All other components within normal limits  I-STAT CHEM 8, ED - Abnormal; Notable for the following components:   Potassium 3.3 (*)    Creatinine, Ser 1.30 (*)    Glucose, Bld 193 (*)    Calcium, Ion 1.10 (*)  TCO2 21 (*)    All other components within normal limits  CBG MONITORING, ED - Abnormal;  Notable for the following components:   Glucose-Capillary 186 (*)    All other components within normal limits  URINALYSIS, W/ REFLEX TO CULTURE (INFECTION SUSPECTED)  RAPID URINE DRUG SCREEN, HOSP PERFORMED  TROPONIN I (HIGH SENSITIVITY)  TROPONIN I (HIGH SENSITIVITY)    Notable for CKD, improved from prior   EKG   EKG Interpretation Date/Time:  Monday July 16 2024 08:44:28 EDT Ventricular Rate:  105 PR Interval:    QRS Duration:  84 QT Interval:  346 QTC Calculation: 458 R Axis:   26  Text Interpretation: Atrial fibrillation Ventricular premature complex Low voltage, extremity leads Nonspecific T abnormalities, lateral leads Confirmed by Francesca Fallow ([id number]) on 07/16/2024 8:48:55 AM         Imaging Studies ordered: I ordered imaging studies including CXR, CT head  On my interpretation imaging demonstrates no acute process I independently visualized and interpreted imaging. I agree with the radiologist interpretation   Medicines ordered and prescription drug management: Meds ordered this encounter  Medications   fentaNYL  (SUBLIMAZE ) injection 50 mcg   prochlorperazine  (COMPAZINE ) injection 10 mg   diphenhydrAMINE  (BENADRYL ) injection 12.5 mg   lactated ringers  bolus 1,000 mL    -I have reviewed the patients home medicines and have made adjustments as needed    Cardiac Monitoring: The patient was maintained on a cardiac monitor.  I personally viewed and interpreted the cardiac monitored which showed an underlying rhythm of: afib   Social Determinants of Health:  Diagnosis or treatment significantly limited by social determinants of health: polysubstance abuse   Reevaluation: After the interventions noted above, I reevaluated the patient and found that their symptoms have resolved  Co morbidities that complicate the patient evaluation  Past Medical History:  Diagnosis Date   Achilles tendon rupture right   Arthritis    back and shoulders   Atrial  flutter with rapid ventricular response (HCC) 07/22/2020   Chest pain    a. 05/2011 - admit w/ c/p in setting of cocaine-->Cath:  nl cors, nl EF, no vasospasm noted.   CKD (chronic kidney disease), stage III (HCC)    COVID-19 virus infection 05/31/2019   Diabetes mellitus without complication (HCC)    Drug abuse (HCC)    cocaine, marijuana abuse   GERD (gastroesophageal reflux disease)    History of echocardiogram    a. 07/2020 Echo: EF 65-70%, no rwma, mild conc LVH, nl RV fxn, mildly dil LA.   Hypertension       Dispostion: Disposition decision including need for hospitalization was considered, and patient discharged from emergency department.    Final Clinical Impression(s) / ED Diagnoses Final diagnoses:  Bad headache     This chart was dictated using voice recognition software.  Despite best efforts to proofread,  errors can occur which can change the documentation meaning.    Francesca Fallow CROME, MD 07/16/24 [phone]

## 2024-07-18 DIAGNOSIS — R519 Headache, unspecified: Secondary | ICD-10-CM | POA: Insufficient documentation

## 2024-07-24 ENCOUNTER — Encounter: Payer: Self-pay | Admitting: Neurology

## 2024-07-25 NOTE — Progress Notes (Addendum)
 COVID Vaccine Completed:  Date of COVID positive in last 90 days:  PCP - [facility] Cardiologist - Vinie Maxcy, MD Electrophysiologist- Ole Holts, MD  Chest x-ray - 07/16/24 Epic EKG - 07/16/24 Epic Stress Test - n/a ECHO - 04/26/23 Epic Cardiac Cath - n/a Pacemaker/ICD device last checked: n/a Spinal Cord Stimulator: n/a  Bowel Prep - no  Sleep Study - n/a CPAP -   Fasting Blood Sugar - 100-200 Checks Blood Sugar weekly  Last dose of GLP1 agonist-  N/A GLP1 instructions:  Do not take after     Last dose of SGLT-2 inhibitors-  N/A SGLT-2 instructions:  Do not take after     Blood Thinner Instructions: patient states he has been off Eliquis  for months due to cost Aspirin Instructions: Last Dose:  Activity level: Can go up a flight of stairs and perform activities of daily living without stopping and without symptoms of chest pain or shortness of breath.   Anesthesia review: HTN, a fib, DM2, syncope, CKD, CHF, A1C 10.2  Patient denies shortness of breath, fever, cough and chest pain at PAT appointment  Patient verbalized understanding of instructions that were given to them at the PAT appointment. Patient was also instructed that they will need to review over the PAT instructions again at home before surgery.

## 2024-07-25 NOTE — Patient Instructions (Addendum)
 SURGICAL WAITING ROOM VISITATION  Patients having surgery or a procedure may have no more than 2 support people in the waiting area - these visitors may rotate.    Children under the age of 24 must have an adult with them who is not the patient.  Visitors with respiratory illnesses are discouraged from visiting and should remain at home.  If the patient needs to stay at the hospital during part of their recovery, the visitor guidelines for inpatient rooms apply. Pre-op nurse will coordinate an appropriate time for 1 support person to accompany patient in pre-op.  This support person may not rotate.    Please refer to the [facility] website for the visitor guidelines for Inpatients (after your surgery is over and you are in a regular room).    Your procedure is scheduled on: 08/03/24   Report to [facility] Main Entrance    Report to admitting at 12:00 PM   Call this number if you have problems the morning of surgery [phone]   Do not eat food :After Midnight.   After Midnight you may have the following liquids until 11:15 AM DAY OF SURGERY  Water Non-Citrus Juices (without pulp, NO RED-Apple, White grape, White cranberry) Black Coffee (NO MILK/CREAM OR CREAMERS, sugar ok)  Clear Tea (NO MILK/CREAM OR CREAMERS, sugar ok) regular and decaf                             Plain Jell-O (NO RED)                                           Fruit ices (not with fruit pulp, NO RED)                                     Popsicles (NO RED)                                                               Sports drinks like Gatorade (NO RED)                 The day of surgery:  Drink ONE (1) Pre-Surgery G2 at 11:15 AM the morning of surgery. Drink in one sitting. Do not sip.  This drink was given to you during your hospital  pre-op appointment visit. Nothing else to drink after completing the  Pre-Surgery G2.          If you have questions, please contact your surgeon's  office.   FOLLOW BOWEL PREP AND ANY ADDITIONAL PRE OP INSTRUCTIONS YOU RECEIVED FROM YOUR SURGEON'S OFFICE!!!     Oral Hygiene is also important to reduce your risk of infection.                                    Remember - BRUSH YOUR TEETH THE MORNING OF SURGERY WITH YOUR REGULAR TOOTHPASTE  DENTURES WILL BE REMOVED PRIOR TO SURGERY PLEASE DO NOT APPLY Poly grip OR ADHESIVES!!!  Stop all vitamins and herbal supplements 7 days before surgery.   Take these medicines the morning of surgery with A SIP OF WATER: Diltiazem , Zofran , Pantoprazole , Hydrocodone , Zyrtec, Amlodipine   DO NOT TAKE ANY ORAL DIABETIC MEDICATIONS DAY OF YOUR SURGERY  How to Manage Your Diabetes Before and After Surgery  Why is it important to control my blood sugar before and after surgery? Improving blood sugar levels before and after surgery helps healing and can limit problems. A way of improving blood sugar control is eating a healthy diet by:  Eating less sugar and carbohydrates  Increasing activity/exercise  Talking with your doctor about reaching your blood sugar goals High blood sugars (greater than 180 mg/dL) can raise your risk of infections and slow your recovery, so you will need to focus on controlling your diabetes during the weeks before surgery. Make sure that the doctor who takes care of your diabetes knows about your planned surgery including the date and location.  How do I manage my blood sugar before surgery? Check your blood sugar at least 4 times a day, starting 2 days before surgery, to make sure that the level is not too high or low. Check your blood sugar the morning of your surgery when you wake up and every 2 hours until you get to the Short Stay unit. If your blood sugar is less than 70 mg/dL, you will need to treat for low blood sugar: Do not take insulin . Treat a low blood sugar (less than 70 mg/dL) with  cup of clear juice (cranberry or apple), 4 glucose tablets, OR glucose  gel. Recheck blood sugar in 15 minutes after treatment (to make sure it is greater than 70 mg/dL). If your blood sugar is not greater than 70 mg/dL on recheck, call [phone] for further instructions. Report your blood sugar to the short stay nurse when you get to Short Stay.  If you are admitted to the hospital after surgery: Your blood sugar will be checked by the staff and you will probably be given insulin  after surgery (instead of oral diabetes medicines) to make sure you have good blood sugar levels. The goal for blood sugar control after surgery is 80-180 mg/dL.   WHAT DO I DO ABOUT MY DIABETES MEDICATION?  Do not take oral diabetes medicines (pills) the morning of surgery.  THE DAY BEFORE SURGERY, take morning dose of Glipizide and Insulin  as prescribed. DO not take evening dose of Glipizide, take 50% of Insulin  dose.     THE MORNING OF SURGERY, do not take Glipizide, take 50% of Insulin .  Reviewed and Endorsed by [facility] Patient Education Committee, August 2015                              You may not have any metal on your body including jewelry, and body piercing             Do not wear lotions, powders, cologne, or deodorant              Men may shave face and neck.   Do not bring valuables to the hospital. [facility] IS NOT             RESPONSIBLE   FOR VALUABLES.   Contacts, glasses, dentures or bridgework may not be worn into surgery.  DO NOT BRING YOUR HOME MEDICATIONS TO THE HOSPITAL. PHARMACY WILL DISPENSE MEDICATIONS LISTED ON YOUR MEDICATION LIST TO YOU DURING  YOUR ADMISSION IN THE HOSPITAL!    Patients discharged on the day of surgery will not be allowed to drive home.  Someone NEEDS to stay with you for the first 24 hours after anesthesia.              Please read over the following fact sheets you were given: IF YOU HAVE QUESTIONS ABOUT YOUR PRE-OP INSTRUCTIONS PLEASE CALL [phone].   If you received a COVID test during your pre-op  visit  it is requested that you wear a mask when out in public, stay away from anyone that may not be feeling well and notify your surgeon if you develop symptoms. If you test positive for Covid or have been in contact with anyone that has tested positive in the last 10 days please notify you surgeon.    [place] - Preparing for Surgery Before surgery, you can play an important role.  Because skin is not sterile, your skin needs to be as free of germs as possible.  You can reduce the number of germs on your skin by washing with CHG (chlorahexidine gluconate) soap before surgery.  CHG is an antiseptic cleaner which kills germs and bonds with the skin to continue killing germs even after washing. Please DO NOT use if you have an allergy to CHG or antibacterial soaps.  If your skin becomes reddened/irritated stop using the CHG and inform your nurse when you arrive at Short Stay. Do not shave (including legs and underarms) for at least 48 hours prior to the first CHG shower.  You may shave your face/neck.  Please follow these instructions carefully:  1.  Shower with CHG Soap the night before surgery and the  morning of surgery.  2.  If you choose to wash your hair, wash your hair first as usual with your normal  shampoo.  3.  After you shampoo, rinse your hair and body thoroughly to remove the shampoo.                             4.  Use CHG as you would any other liquid soap.  You can apply chg directly to the skin and wash.  Gently with a scrungie or clean washcloth.  5.  Apply the CHG Soap to your body ONLY FROM THE NECK DOWN.   Do   not use on face/ open                           Wound or open sores. Avoid contact with eyes, ears mouth and   genitals (private parts).                       Wash face,  Genitals (private parts) with your normal soap.             6.  Wash thoroughly, paying special attention to the area where your    surgery  will be performed.  7.  Thoroughly rinse your body with  warm water from the neck down.  8.  DO NOT shower/wash with your normal soap after using and rinsing off the CHG Soap.                9.  Pat yourself dry with a clean towel.            10.  Wear clean pajamas.  11.  Place clean sheets on your bed the night of your first shower and do not  sleep with pets. Day of Surgery : Do not apply any lotions/deodorants the morning of surgery.  Please wear clean clothes to the hospital/surgery center.  FAILURE TO FOLLOW THESE INSTRUCTIONS MAY RESULT IN THE CANCELLATION OF YOUR SURGERY  PATIENT SIGNATURE_________________________________  NURSE SIGNATURE__________________________________  ________________________________________________________________________  Ryan [name]  An incentive spirometer is a tool that can help keep your lungs clear and active. This tool measures how well you are filling your lungs with each breath. Taking long deep breaths may help reverse or decrease the chance of developing breathing (pulmonary) problems (especially infection) following: A long period of time when you are unable to move or be active. BEFORE THE PROCEDURE  If the spirometer includes an indicator to show your best effort, your nurse or respiratory therapist will set it to a desired goal. If possible, sit up straight or lean slightly forward. Try not to slouch. Hold the incentive spirometer in an upright position. INSTRUCTIONS FOR USE  Sit on the edge of your bed if possible, or sit up as far as you can in bed or on a chair. Hold the incentive spirometer in an upright position. Breathe out normally. Place the mouthpiece in your mouth and seal your lips tightly around it. Breathe in slowly and as deeply as possible, raising the piston or the ball toward the top of the column. Hold your breath for 3-5 seconds or for as long as possible. Allow the piston or ball to fall to the bottom of the column. Remove the mouthpiece from your mouth and  breathe out normally. Rest for a few seconds and repeat Steps 1 through 7 at least 10 times every 1-2 hours when you are awake. Take your time and take a few normal breaths between deep breaths. The spirometer may include an indicator to show your best effort. Use the indicator as a goal to work toward during each repetition. After each set of 10 deep breaths, practice coughing to be sure your lungs are clear. If you have an incision (the cut made at the time of surgery), support your incision when coughing by placing a pillow or rolled up towels firmly against it. Once you are able to get out of bed, walk around indoors and cough well. You may stop using the incentive spirometer when instructed by your caregiver.  RISKS AND COMPLICATIONS Take your time so you do not get dizzy or light-headed. If you are in pain, you may need to take or ask for pain medication before doing incentive spirometry. It is harder to take a deep breath if you are having pain. AFTER USE Rest and breathe slowly and easily. It can be helpful to keep track of a log of your progress. Your caregiver can provide you with a simple table to help with this. If you are using the spirometer at home, follow these instructions: SEEK MEDICAL CARE IF:  You are having difficultly using the spirometer. You have trouble using the spirometer as often as instructed. Your pain medication is not giving enough relief while using the spirometer. You develop fever of 100.5 F (38.1 C) or higher. SEEK IMMEDIATE MEDICAL CARE IF:  You cough up bloody sputum that had not been present before. You develop fever of 102 F (38.9 C) or greater. You develop worsening pain at or near the incision site. MAKE SURE YOU:  Understand these instructions. Will watch your condition. Will get  help right away if you are not doing well or get worse. Document Released: 04/11/2007 Document Revised: 02/21/2012 Document Reviewed: 06/12/2007 [facility] Patient  Information 2014 [place], [place].   ________________________________________________________________________

## 2024-07-26 ENCOUNTER — Encounter (HOSPITAL_COMMUNITY)
Admission: RE | Admit: 2024-07-26 | Discharge: 2024-07-26 | Disposition: A | Discharge status: Home / Self Care | Source: Ambulatory Visit | Attending: Orthopedic Surgery | Admitting: Orthopedic Surgery

## 2024-07-26 ENCOUNTER — Encounter (HOSPITAL_COMMUNITY): Payer: Self-pay | Admitting: Medical

## 2024-07-26 ENCOUNTER — Encounter (HOSPITAL_COMMUNITY): Payer: Self-pay

## 2024-07-26 ENCOUNTER — Other Ambulatory Visit: Payer: Self-pay

## 2024-07-26 VITALS — BP 117/85 | HR 85 | Temp 97.7°F | Resp 16 | Ht 69.0 in | Wt 229.9 lb

## 2024-07-26 DIAGNOSIS — E119 Type 2 diabetes mellitus without complications: Secondary | ICD-10-CM | POA: Diagnosis not present

## 2024-07-26 DIAGNOSIS — Z794 Long term (current) use of insulin: Secondary | ICD-10-CM | POA: Insufficient documentation

## 2024-07-26 DIAGNOSIS — Z01812 Encounter for preprocedural laboratory examination: Secondary | ICD-10-CM | POA: Diagnosis not present

## 2024-07-26 DIAGNOSIS — Z01818 Encounter for other preprocedural examination: Secondary | ICD-10-CM | POA: Diagnosis present

## 2024-07-26 LAB — GLUCOSE, CAPILLARY: Glucose-Capillary: 126 mg/dL — ABNORMAL HIGH (ref 70–99)

## 2024-07-27 ENCOUNTER — Telehealth: Payer: Self-pay

## 2024-07-27 LAB — HEMOGLOBIN A1C
Hgb A1c MFr Bld: 10.2 % — ABNORMAL HIGH (ref 4.8–5.6)
Mean Plasma Glucose: 246 mg/dL

## 2024-07-27 NOTE — Telephone Encounter (Signed)
   Pre-operative Risk Assessment    Patient Name: Ryan [name]  DOB: [birth year] MRN: [id number]   Date of last office visit: 08/12/23 Date of next office visit: Not scheduled   Request for Surgical Clearance    Procedure:  Left shoulder scope with RCR, biceps tenodesis and SAD with choice anesthesia  Date of Surgery:  Clearance 08/03/24                                Surgeon:  Dr. Selinda Gosling Surgeon's Group or Practice Name:  [facility] Phone number:  [phone] Fax number:  [phone]   Type of Clearance Requested:   - Medical  - Pharmacy:  Hold Apixaban  (Eliquis )     Type of Anesthesia:  Choice   Additional requests/questions:    Bonney Ival LOISE Gerome   07/27/2024, 4:23 PM

## 2024-07-27 NOTE — Telephone Encounter (Signed)
 Pharmacy please advise on holding Eliquis  prior to Left shoulder scope with RCR, biceps tenodesis and SAD with choice anesthesia  scheduled for 08/03/2024. Last labs 07/16/2024 (CBC and BMET) Thank you.

## 2024-07-27 NOTE — Progress Notes (Signed)
 A1C 10.2, results routed to Dr. Sharl

## 2024-07-27 NOTE — Telephone Encounter (Signed)
   Name: Ryan [name]  DOB: [birth year]  MRN: [id number]  Primary Cardiologist: Vinie JAYSON Maxcy, MD  Chart reviewed as part of pre-operative protocol coverage. Because of [name] past medical history and time since last visit, he will require a follow-up in-office visit in order to better assess preoperative cardiovascular risk.  Has not been seen since 08/12/2023. Needs appt ASAP. I have sent request to pharmacist  [facility] about Eliquis .  Pre-op covering staff: - Please schedule appointment and call patient to inform them. If patient already had an upcoming appointment within acceptable timeframe, please add pre-op clearance to the appointment notes so provider is aware. - Please contact requesting surgeon's office via preferred method (i.e, phone, fax) to inform them of need for appointment prior to surgery.    Lamarr Satterfield, NP  07/27/2024, 4:32 PM

## 2024-07-27 NOTE — Telephone Encounter (Signed)
 Left message for surgery scheduler that we are trying to find an appt for preop clearance for the pt. However, schedules are full. We will keep looking.

## 2024-07-29 NOTE — Telephone Encounter (Signed)
 Patient with diagnosis of atrial fibrillation on Eliquis  for anticoagulation.    Procedure:  Left shoulder scope with RCR, biceps tenodesis and SAD with choice anesthesia   Date of Surgery:  Clearance 08/03/24       CHA2DS2-VASc Score = 2   This indicates a 2.2% annual risk of stroke. The patient's score is based upon: CHF History: 0 HTN History: 1 Diabetes History: 1 Stroke History: 0 Vascular Disease History: 0 Age Score: 0 Gender Score: 0    CrCl 90 Platelet count 288  Patient has not  had an Afib/aflutter ablation within the last 3 months or DCCV within the last 30 days  Per office protocol, patient can hold Eliquis  for 3 days prior to procedure.   Patient will not need bridging with Lovenox  (enoxaparin ) around procedure.  **This guidance is not considered finalized until pre-operative APP has relayed final recommendations.**

## 2024-07-30 NOTE — Telephone Encounter (Signed)
 Surgery scheduler Kerri: I have tried also sent a text to call I don't have an email on file.   Me: I am not sure what else to do. There are other contacts on file, but we do not have a release for our practice to s/w anyone but the pt.   Kerri: I also tried calling his emergency contact but no answer.

## 2024-07-30 NOTE — Telephone Encounter (Signed)
 I tried to call the pt to schedule an appt though call would not go through. I called surgeon office and s/w Kirke HERO surgery scheduler and told her I can get the pt in tomorrow but I cannot reach the pt. We confirmed ph#'s. Kirke states she will try to reach the pt. I gave the appt times avaiable right now for 8/19; 9:15 or 3:10.   Kirke states she will secure chat me back and let me know if she was able to s/w the pt and offer appt time for tomorrow. I did say that I cannot promise these will still be available.

## 2024-08-03 ENCOUNTER — Ambulatory Visit (HOSPITAL_COMMUNITY): Admission: RE | Admit: 2024-08-03 | Source: Home / Self Care | Admitting: Orthopedic Surgery

## 2024-08-03 ENCOUNTER — Encounter (HOSPITAL_COMMUNITY): Admission: RE | Payer: Self-pay | Source: Home / Self Care

## 2024-08-03 SURGERY — ARTHROSCOPY, SHOULDER, WITH ROTATOR CUFF REPAIR
Anesthesia: Choice | Laterality: Left

## 2024-08-29 DIAGNOSIS — R110 Nausea: Secondary | ICD-10-CM | POA: Insufficient documentation

## 2024-08-29 DIAGNOSIS — B351 Tinea unguium: Secondary | ICD-10-CM | POA: Insufficient documentation

## 2024-08-29 DIAGNOSIS — K219 Gastro-esophageal reflux disease without esophagitis: Secondary | ICD-10-CM | POA: Insufficient documentation

## 2024-08-29 DIAGNOSIS — G8929 Other chronic pain: Secondary | ICD-10-CM | POA: Insufficient documentation

## 2024-08-30 ENCOUNTER — Other Ambulatory Visit: Payer: Self-pay | Admitting: Physician Assistant

## 2024-08-30 DIAGNOSIS — D509 Iron deficiency anemia, unspecified: Secondary | ICD-10-CM | POA: Insufficient documentation

## 2024-08-30 DIAGNOSIS — R748 Abnormal levels of other serum enzymes: Secondary | ICD-10-CM

## 2024-09-04 ENCOUNTER — Encounter: Payer: Self-pay | Admitting: Physician Assistant

## 2024-09-06 ENCOUNTER — Other Ambulatory Visit

## 2024-09-12 ENCOUNTER — Ambulatory Visit (INDEPENDENT_AMBULATORY_CARE_PROVIDER_SITE_OTHER): Admitting: Podiatry

## 2024-09-12 DIAGNOSIS — M79674 Pain in right toe(s): Secondary | ICD-10-CM | POA: Diagnosis not present

## 2024-09-12 DIAGNOSIS — M79675 Pain in left toe(s): Secondary | ICD-10-CM | POA: Diagnosis not present

## 2024-09-12 DIAGNOSIS — B351 Tinea unguium: Secondary | ICD-10-CM | POA: Diagnosis not present

## 2024-09-12 NOTE — Progress Notes (Signed)
  Subjective:  Patient ID: Ryan [name], male    DOB: 08/10/1961,  MRN: [id number]  Chief Complaint  Patient presents with   Nail Problem    Nail trim    63 y.o. male returns for the above complaint.  Patient presents with thickened elongated dystrophic mycotic toenails x 10 mild pain on palpation hurts with ambulation and shoe pressure has not seen MRIs prior to seeing me denies any other acute complaints he would like to have it debrided down  Objective:  There were no vitals filed for this visit. Podiatric Exam: Vascular: dorsalis pedis and posterior tibial pulses are palpable bilateral. Capillary return is immediate. Temperature gradient is WNL. Skin turgor WNL  Sensorium: Normal Semmes Weinstein monofilament test. Normal tactile sensation bilaterally. Nail Exam: Pt has thick disfigured discolored nails with subungual debris noted bilateral entire nail hallux through fifth toenails.  Pain on palpation to the nails. Ulcer Exam: There is no evidence of ulcer or pre-ulcerative changes or infection. Orthopedic Exam: Muscle tone and strength are WNL. No limitations in general ROM. No crepitus or effusions noted.  Skin: No Porokeratosis. No infection or ulcers    Assessment & Plan:   1. Pain due to onychomycosis of toenails of both feet     Patient was evaluated and treated and all questions answered.  Onychomycosis with pain  -Nails palliatively debrided as below. -Educated on self-care  Procedure: Nail Debridement Rationale: pain  Type of Debridement: manual, sharp debridement. Instrumentation: Nail nipper, rotary burr. Number of Nails: 10  Procedures and Treatment: Consent by patient was obtained for treatment procedures. The patient understood the discussion of treatment and procedures well. All questions were answered thoroughly reviewed. Debridement of mycotic and hypertrophic toenails, 1 through 5 bilateral and clearing of subungual debris. No ulceration, no  infection noted.  Return Visit-Office Procedure: Patient instructed to return to the office for a follow up visit 3 months for continued evaluation and treatment.  Franky Blanch, DPM    No follow-ups on file.

## 2024-09-13 DIAGNOSIS — R600 Localized edema: Secondary | ICD-10-CM | POA: Insufficient documentation

## 2024-10-08 NOTE — Progress Notes (Signed)
 Initial neurology clinic note  Ryan [name] MRN: [id number] DOB: 1961-09-16  Referring provider: Theo Eleanor JINNY [name]  Primary care provider: Scarlett Ronal Caldron, NP  Reason for consult:  headaches  Subjective:  This is Mr. Ryan [name], a 63 y.o. right-handed male with a medical history of afib (not on AC), DM, CKD, HTN, polysubstance abuse who presents to neurology clinic with headaches. The patient is accompanied by wife. Wife speaks English but with difficulty when helping with history.  Patient woke up on the morning of 07/16/24 to use the restroom. He does not recall what happened next. He was found on the floor by his wife in the morning, for unclear period of time. She found him sitting on the floor against the wall and awake. She asked him what happened, but he did not know and could not talk well. EMS was called. He was combative with them, but calmed down and went to hospital with them. He knew something wasn't right. Nothing like this had every happened before. He may have urinated himself but this is unclear. CT head at the hospital was normal. Per patient, he had not been drinking or using drugs.  He states since this, he has different feelings in his hands. His head hurts also. It is his whole head. He cannot describe the pain except it is a pain I've never had. With further questioning he thinks this is a pressure. Since 07/16/24, the pain has occurred 3-4 times. He also still has confusion.  Patient is not sure what medications he is on but states he was he takes blood pressure medications and what he has been given. He stopped eliquis  though and does not take it.  When he gets a headache, he may take ibuprofen  or tramadol . This helps resolve his headache in about 1 hour.  Regarding his cognition, wife states he may have been different for longer than just since 07/2024. He sometimes cries without clear explanation.  Smoker: No Caffiene use: EtOH  use: 2-3 cans of beer, not clear how often; sounds like he has a problem per wife; but not drinking for about 1 month Substance abuse: no per patient, wife questioning patient though Family history of neurologic disease including headaches and seizures: sister with seizures   MEDICATIONS:  Outpatient Encounter Medications as of 10/17/2024  Medication Sig Note   amLODipine  (NORVASC ) 10 MG tablet Take 10 mg by mouth daily.    diltiazem  (CARDIZEM  CD) 240 MG 24 hr capsule Take 1 capsule (240 mg total) by mouth daily.    glipiZIDE (GLUCOTROL) 10 MG tablet Take 10 mg by mouth daily. 07/26/2024: No fill hx found. Pt is adamant he is taking this.    insulin  NPH-regular Human (NOVOLIN  70/30) (70-30) 100 UNIT/ML injection Inject 35 units into the skin 2 times daily with a meal. 07/26/2024: No fill hx. Pt is adamant he is taking this medication PRN for high BS.    losartan  (COZAAR ) 50 MG tablet Take 1 tablet (50 mg total) by mouth daily. 07/26/2024: No fill hx. Pt is adamant he is taking this   pantoprazole  (PROTONIX ) 40 MG tablet Take 1 tablet (40 mg total) by mouth daily. 07/26/2024: Pt is adamant he is still taking this medication. Dispense report does not support this claim.    ZYRTEC ALLERGY 10 MG tablet Take 10 mg by mouth in the morning.    apixaban  (ELIQUIS ) 5 MG TABS tablet Take 1 tablet (5 mg total) by mouth 2 (two) times  daily. (Patient not taking: Reported on 07/26/2024)    cyclobenzaprine  (FLEXERIL ) 10 MG tablet Take 10 mg by mouth 3 (three) times daily. (Patient not taking: Reported on 07/26/2024)    diclofenac  Sodium (VOLTAREN ) 1 % GEL Apply 2 g topically 4 (four) times daily. (Patient taking differently: Apply 1 Application topically daily.)    dicyclomine  (BENTYL ) 20 MG tablet Take 1 tablet (20 mg total) by mouth 2 (two) times daily as needed for spasms. (Patient taking differently: Take 20 mg by mouth daily.) 07/26/2024: Pt is adamant he is still taking this medication. Dispense report does not  support this claim.    HYDROcodone -acetaminophen  (NORCO/VICODIN) 5-325 MG tablet Take 1 tablet by mouth daily. (Patient not taking: Reported on 10/17/2024)    ondansetron  (ZOFRAN -ODT) 8 MG disintegrating tablet Take 1 tablet (8 mg total) by mouth every 8 (eight) hours as needed for nausea or vomiting. (Patient not taking: Reported on 10/17/2024)    topiramate (TOPAMAX) 25 MG tablet Take 75 mg by mouth at bedtime. (Patient not taking: Reported on 07/26/2024)    traMADol  (ULTRAM ) 50 MG tablet Take 1 tablet (50 mg total) by mouth every 6 (six) hours as needed. (Patient not taking: Reported on 07/26/2024)    No facility-administered encounter medications on file as of 10/17/2024.    PAST MEDICAL HISTORY: Past Medical History:  Diagnosis Date   Achilles tendon rupture right   Arthritis    back and shoulders   Atrial flutter with rapid ventricular response (HCC) 07/22/2020   Chest pain    a. 05/2011 - admit w/ c/p in setting of cocaine-->Cath:  nl cors, nl EF, no vasospasm noted.   CKD (chronic kidney disease), stage III (HCC)    COVID-19 virus infection 05/31/2019   Diabetes mellitus without complication (HCC)    Drug abuse (HCC)    cocaine, marijuana abuse   GERD (gastroesophageal reflux disease)    History of echocardiogram    a. 07/2020 Echo: EF 65-70%, no rwma, mild conc LVH, nl RV fxn, mildly dil LA.   Hypertension     PAST SURGICAL HISTORY: Past Surgical History:  Procedure Laterality Date   ACHILLES TENDON SURGERY Right 05/02/2014   Procedure: RIGHT ACHILLES TENDON REPAIR;  Surgeon: Reyes JAYSON Billing, MD;  Location: [facility];  Service: Orthopedics;  Laterality: Right;   BIOPSY  07/25/2020   Procedure: BIOPSY;  Surgeon: Elicia Claw, MD;  Location: [facility];  Service: Gastroenterology;;   CARDIAC CATHETERIZATION  05.31.2012   showing patent coronaries with no visualized vasospasm and EF of 55-60 %   ESOPHAGOGASTRODUODENOSCOPY N/A 07/25/2020   Procedure: ESOPHAGOGASTRODUODENOSCOPY  (EGD);  Surgeon: Elicia Claw, MD;  Location: [facility];  Service: Gastroenterology;  Laterality: N/A;   ORBITAL FRACTURE SURGERY Left 2-3 yrs ago    ALLERGIES: No Known Allergies  FAMILY HISTORY: Family History  Problem Relation Age of Onset   Unexplained death Mother 39       She died in 2016/11/13, he does not remember what she died of   Unexplained death Father        He has no information on his father    SOCIAL HISTORY: Social History   Tobacco Use   Smoking status: Never   Smokeless tobacco: Never  Vaping Use   Vaping status: Never Used  Substance Use Topics   Alcohol use: Yes    Comment: occ   Drug use: Not Currently    Types: Cocaine, Marijuana    Comment: last cocaine use 1 year ago, last marijuana  use 4 to 5 years ago 01/06/22   Social History   Social History Narrative   ** Merged History Encounter **       Pt currently lives with his brother, and his sister in [place]. He is employed by [employer]. He denies tobacco use. He does endorse occasional alcohol use. He does endorse cocaine use.  Family history : Non contributory   Are you right handed or left handed? Right   Are you currently employed ?    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   Who lives with you? wife   What type of home do you live in: 1 story or 2 story? one    Caffiene 2 sodas a day    Objective:  Vital Signs:  BP 111/69   Pulse 76   Ht 5' 9 (1.753 m)   Wt 197 lb (89.4 kg)   SpO2 100%   BMI 29.09 kg/m   General: General appearance: Awake and alert. No distress. Cooperative with exam.  Fundoscopy: Disc margins clear on right, unable to visualize on left Lungs: Non-labored breathing on room air  Heart: Regular Extremities: No obvious deformity.  Psych: Affect appropriate.  Neurological: Mental Status: Alert. Speech fluent. ?pseudobulbar affect (crying for unclear reason when discussing work up and my concerns) Had  difficulty providing history and following instructions during examination Cranial Nerves: CNII: No RAPD. Visual fields intact. CNIII, IV, VI: PERRL. No nystagmus. EOMI. Left eye exotropia (chronic per patient) CN V: Facial sensation intact bilaterally to fine touch. CN VII: Facial muscles symmetric and strong. No ptosis at rest. CN VIII: Hears finger rub well bilaterally. CN IX: No hypophonia. CN X: Palate elevates symmetrically. CN XI: Full strength shoulder shrug bilaterally. CN XII: Tongue protrusion full and midline. No atrophy or fasciculations. No significant dysarthria Motor: Tone is normal. Strength 5/5 in bilateral upper and lower extremities. Reflexes:  Right Left   Bicep 2+ 2+   Tricep 2+ 2+   BrRad 2+ 2+   Knee 1+ 1+   Ankle 0 0    Pathological Reflexes: Hoffman: absent bilaterally Troemner: absent bilaterally Sensation: Pinprick: Intact in all extremities Vibration: Intact in all extremities Coordination: Intact finger-to- nose-finger bilaterally. Romberg negative. Gait: Able to rise from chair with arms crossed unassisted. Normal, narrow-based gait. ?Reduced arm swing.   Labs and Imaging review: Internal labs: HbA1c (07/26/24): 10.2  07/16/24: CBC w/ diff significant for MCV 73.4 CMP significant for K 3.3, Cr 1.37, glucose 195, albumin 3.4  Imaging/Procedures: CTA head and neck (03/11/21): IMPRESSION: 1. No emergent large vessel occlusion or high-grade stenosis of the intracranial arteries. 2. Diffuse sinus mucosal thickening with frontal sinus fluid levels. Correlate for symptoms of acute sinusitis.  CT head wo contrast (07/16/24): FINDINGS: Brain: Cerebral volume is within normal limits for age. No midline shift, ventriculomegaly, mass effect, evidence of mass lesion, intracranial hemorrhage or evidence of cortically based acute infarction. Patchy mild to moderate bilateral cerebral white matter hypodensity is stable since 2022. Maintained  gray-white differentiation.   Vascular: No suspicious intracranial vascular hyperdensity. Mild Calcified atherosclerosis at the skull base.   Skull: Chronic left orbital wall fractures, stable chronic ORIF left frontal sinus and superior orbital rim. No acute osseous abnormality identified.   Sinuses/Orbits: Chronic paranasal sinus disease, but substantially improved bilateral paranasal sinus aeration compared to 2022. 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 Tympanic cavities and mastoids well aerated.   Other:  No acute orbit or scalp soft tissue finding.   IMPRESSION: 1. No acute intracranial abnormality. Stable non contrast CT appearance of chronic cerebral white matter disease, posttraumatic and postoperative changes about the left orbit and frontal sinus. 2. Chronic paranasal sinus disease has improved since 2022.  CT cervical spine wo contrast (07/16/24): FINDINGS: Alignment: Normal.   Skull base and vertebrae: No evidence of acute fracture or traumatic subluxation. Probable interbody and interfacetal ankylosis at C2-3.   Soft tissues and spinal canal: No prevertebral fluid or swelling. No visible canal hematoma.   Disc levels: No large disc herniation or high-grade foraminal narrowing demonstrated. Progressive mild multilevel spondylosis with interbody and interfacetal ankylosis at C2-3. Asymmetric facet arthropathy on the left at C7-T1.   Upper chest: Clear lung apices.   Other: Mild bilateral carotid atherosclerosis.   IMPRESSION: 1. No evidence of acute cervical spine fracture, traumatic subluxation or static signs of instability. 2. Progressive mild multilevel spondylosis.  Assessment/Plan:  Kipp Shank is a 63 y.o. male who presents for evaluation of headache and cognitive changes since being found down on 07/16/24. He has a relevant medical history of afib (not on AC), DM, CKD, HTN, polysubstance abuse. His neurological examination  is pertinent for difficulty following commands and giving history. Available diagnostic data is significant for CT head that showed no acute process. Recent HbA1c was 10.2 and Cr 1.37.   This is a very confusing case. I thought his problem was headaches after a fall, and while he has headaches, it seems like maybe he is having 1 per month that resolves within 1 hour with ibuprofen  (if I am understanding him correctly). It is not even clear to me if patient had a fall or was just found down. This is the more concerning problem to me: why he was found down and his cognition changes. I am unclear what is the cause of this. I think MRI brain is needed with lab work. He has afib and not taking AC, so stroke is possible. Seizure is another consideration. Metabolic encephalopathy or EtOH or drugs could also be involved. A neurodegenerative condition is also possible, particularly if patient crying for unclear reason is pseudobulbar affect.  PLAN: -Blood work: B1, B12, TSH -MRI brain w/wo contrast -Continue ibuprofen  as needed for headaches -May consider LP or EEG pending results   -Return to clinic in 3 months  The impression above as well as the plan as outlined below were extensively discussed with the patient (in the company of wife) who voiced understanding. All questions were answered to their satisfaction.  When available, results of the above investigations and possible further recommendations will be communicated to the patient via telephone/MyChart. Patient to call office if not contacted after expected testing turnaround time.   Total time spent reviewing records, interview, history/exam, documentation, and coordination of care on day of encounter:  65 min   Thank you for allowing me to participate in patient's care.  If I can answer any additional questions, I would be pleased to do so.  Venetia Potters, MD   CC: Placey, Ronal Caldron, NP [address] [place]  [address]  CC: Referring provider: Theo Eleanor PARAS, PA-C [address] [place],  [place] [address]

## 2024-10-17 ENCOUNTER — Other Ambulatory Visit

## 2024-10-17 ENCOUNTER — Encounter: Payer: Self-pay | Admitting: Neurology

## 2024-10-17 ENCOUNTER — Ambulatory Visit (INDEPENDENT_AMBULATORY_CARE_PROVIDER_SITE_OTHER): Admitting: Neurology

## 2024-10-17 VITALS — BP 111/69 | HR 76 | Ht 69.0 in | Wt 197.0 lb

## 2024-10-17 DIAGNOSIS — R296 Repeated falls: Secondary | ICD-10-CM | POA: Diagnosis not present

## 2024-10-17 DIAGNOSIS — R4189 Other symptoms and signs involving cognitive functions and awareness: Secondary | ICD-10-CM

## 2024-10-17 DIAGNOSIS — R5383 Other fatigue: Secondary | ICD-10-CM

## 2024-10-17 DIAGNOSIS — R519 Headache, unspecified: Secondary | ICD-10-CM

## 2024-10-17 NOTE — Patient Instructions (Addendum)
 I saw you today for the episode of passing out and now headache and pain in your hands.  I am not clear what is going on and what is causing your symptoms.  I would like to investigate further with the following: -Blood work -MRI of your brain. A referral to [facility]) has been placed for your MRI someone will contact you directly to schedule your appt. They are located at [address] [facility]. Please contact them directly by calling [phone] with any questions regarding your referral.  If you do not hear from someone to schedule in 1-2 weeks, please let me know.   I may have more testing pending the results of the MRI.  You can continue to take ibuprofen  as needed for your headache as it seems to be helpful.  I will be in touch when I have your blood and imaging results. I will see you back in clinic in about 3 months.  Please let me know if you have any questions or concerns in the meantime.   The physicians and staff at [facility] are committed to providing excellent care. You may receive a survey requesting feedback about your experience at our office. We strive to receive very good responses to the survey questions. If you feel that your experience would prevent you from giving the office a very good  response, please contact our office to try to remedy the situation. We may be reached at [phone]. Thank you for taking the time out of your busy day to complete the survey.  Venetia Potters, MD [facility]

## 2024-10-22 ENCOUNTER — Ambulatory Visit: Payer: Self-pay | Admitting: Neurology

## 2024-10-22 LAB — TSH: TSH: 2.23 m[IU]/L (ref 0.40–4.50)

## 2024-10-22 LAB — VITAMIN B1: Vitamin B1 (Thiamine): 7 nmol/L — ABNORMAL LOW (ref 8–30)

## 2024-10-22 LAB — VITAMIN B12: Vitamin B-12: 438 pg/mL (ref 200–1100)

## 2024-10-31 ENCOUNTER — Telehealth: Payer: Self-pay | Admitting: Neurology

## 2024-10-31 NOTE — Telephone Encounter (Signed)
 Pt called in this morning and he wants a return call back in reference to a letter he received from the Dr. Maeola

## 2024-10-31 NOTE — Telephone Encounter (Signed)
 Called pt back and reported why letter was sent. The number he called from is not what we have in our chart [phone]. Reported the letter Per Dr. Leigh, again x3 until he reported understanding.

## 2024-12-27 ENCOUNTER — Encounter: Payer: Self-pay | Admitting: Neurology

## 2025-01-09 ENCOUNTER — Telehealth (HOSPITAL_BASED_OUTPATIENT_CLINIC_OR_DEPARTMENT_OTHER): Payer: Self-pay

## 2025-01-09 NOTE — Telephone Encounter (Signed)
 Called patient, NA, no VM, message Call cannot be completed at this time, try call again later

## 2025-01-09 NOTE — Telephone Encounter (Signed)
"  ° °  Pre-operative Risk Assessment    Patient Name: Ryan [name]  DOB: [birth year] MRN: [id number]   Date of last office visit: 08/12/23 with Camnitz  Date of next office visit: NA  Request for Surgical Clearance    Procedure:  Left shoulder scope with RCR biceps tenodesis and SAD  Date of Surgery:  Clearance TBD                                 Surgeon:  Dr. Sharl Socks Group or Practice Name:  [facility] Phone number:  [phone] Fax number:  [phone]   Type of Clearance Requested:   - Medical  - Pharmacy:  Hold Apixaban  (Eliquis ) not indicated   Type of Anesthesia:  choice   Additional requests/questions:    Bonney Augustin JONETTA Delores   01/09/2025, 10:48 AM   "

## 2025-01-09 NOTE — Telephone Encounter (Signed)
" ° °  Name: Ryan [name]  DOB: January 25, 1961  MRN: [id number]  Primary Cardiologist: Vinie JAYSON Maxcy, MD  Chart reviewed as part of pre-operative protocol coverage. Patient has not been seen in our office in over 1 year (since 07/2023). Because of [name] past medical history and time since last visit, he will require a follow-up in-office visit in order to better assess preoperative cardiovascular risk.  Pre-op covering staff: - Please schedule appointment and call patient to inform them. If patient already had an upcoming appointment within acceptable timeframe, please add pre-op clearance to the appointment notes so provider is aware. - Please contact requesting surgeon's office via preferred method (i.e, phone, fax) to inform them of need for appointment prior to surgery.  This message will also be routed to pharmacy pool or input on holding Eliquis  as requested below so that this information is available to the clearing provider at time of patient's appointment.   Anh Mangano E Aimi Essner, PA-C  01/09/2025, 11:08 AM   "

## 2025-01-10 NOTE — Telephone Encounter (Signed)
 Unable to contact pt. No VM set up. 2nd attempt

## 2025-01-11 ENCOUNTER — Inpatient Hospital Stay: Admission: RE | Admit: 2025-01-11 | Source: Ambulatory Visit

## 2025-01-11 NOTE — Telephone Encounter (Signed)
 3rd attempt to reach pt to schedule in office appt for preop clearance. I will update the requesting office the pt will need to call the office and make appt; [phone].

## 2025-01-15 NOTE — Telephone Encounter (Signed)
 Patient with diagnosis of A Fib on Eliquis  for anticoagulation.    Procedure: Left shoulder scope with RCR biceps tenodesis and SAD  Date of procedure: TBD   CHA2DS2-VASc Score = 3  This indicates a 3.2% annual risk of stroke. The patient's score is based upon: CHF History: 1 HTN History: 1 Diabetes History: 1 Stroke History: 0 Vascular Disease History: 0 Age Score: 0 Gender Score: 0    CrCl 75 ml/min Platelet count 288k  Patient has not had an Afib/aflutter ablation in the last 3 months, DCCV within the last 4 weeks or a watchman implanted in the last 45 days    Per office protocol, patient can hold Eliquis  for 3 days prior to procedure.     **This guidance is not considered finalized until pre-operative APP has relayed final recommendations.**

## 2025-01-30 ENCOUNTER — Other Ambulatory Visit

## 2025-01-31 ENCOUNTER — Ambulatory Visit: Admitting: Neurology
